# Patient Record
Sex: Female | Born: 1990 | Race: Black or African American | Hispanic: No | Marital: Married | Smoking: Never smoker
Health system: Southern US, Community
[De-identification: ages and names within clinical notes are randomized; demographics above are authoritative.]

## PROBLEM LIST (undated history)

## (undated) ENCOUNTER — Inpatient Hospital Stay (HOSPITAL_COMMUNITY): Payer: Self-pay

## (undated) DIAGNOSIS — Z3490 Encounter for supervision of normal pregnancy, unspecified, unspecified trimester: Principal | ICD-10-CM

## (undated) DIAGNOSIS — E669 Obesity, unspecified: Secondary | ICD-10-CM

## (undated) DIAGNOSIS — F32A Depression, unspecified: Secondary | ICD-10-CM

## (undated) DIAGNOSIS — R112 Nausea with vomiting, unspecified: Secondary | ICD-10-CM

## (undated) DIAGNOSIS — O219 Vomiting of pregnancy, unspecified: Secondary | ICD-10-CM

## (undated) DIAGNOSIS — E282 Polycystic ovarian syndrome: Secondary | ICD-10-CM

## (undated) DIAGNOSIS — J45909 Unspecified asthma, uncomplicated: Secondary | ICD-10-CM

## (undated) DIAGNOSIS — K760 Fatty (change of) liver, not elsewhere classified: Secondary | ICD-10-CM

## (undated) DIAGNOSIS — R05 Cough: Secondary | ICD-10-CM

## (undated) DIAGNOSIS — O139 Gestational [pregnancy-induced] hypertension without significant proteinuria, unspecified trimester: Secondary | ICD-10-CM

## (undated) DIAGNOSIS — J069 Acute upper respiratory infection, unspecified: Principal | ICD-10-CM

## (undated) DIAGNOSIS — K219 Gastro-esophageal reflux disease without esophagitis: Secondary | ICD-10-CM

## (undated) DIAGNOSIS — G43909 Migraine, unspecified, not intractable, without status migrainosus: Secondary | ICD-10-CM

## (undated) HISTORY — DX: Polycystic ovarian syndrome: E28.2

## (undated) HISTORY — DX: Fatty (change of) liver, not elsewhere classified: K76.0

## (undated) HISTORY — DX: Cough: R05

## (undated) HISTORY — DX: Vomiting of pregnancy, unspecified: O21.9

## (undated) HISTORY — DX: Acute upper respiratory infection, unspecified: J06.9

## (undated) HISTORY — PX: OTHER SURGICAL HISTORY: SHX169

## (undated) HISTORY — DX: Encounter for supervision of normal pregnancy, unspecified, unspecified trimester: Z34.90

## (undated) HISTORY — PX: TONSILLECTOMY: SUR1361

## (undated) HISTORY — PX: WISDOM TOOTH EXTRACTION: SHX21

---
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
## 2009-03-31 ENCOUNTER — Encounter (INDEPENDENT_AMBULATORY_CARE_PROVIDER_SITE_OTHER): Payer: Self-pay | Admitting: Pediatrics

## 2009-03-31 ENCOUNTER — Other Ambulatory Visit: Admission: RE | Admit: 2009-03-31 | Discharge: 2009-03-31 | Payer: Self-pay | Admitting: Pediatrics

## 2009-10-27 ENCOUNTER — Encounter (HOSPITAL_COMMUNITY): Admission: RE | Admit: 2009-10-27 | Discharge: 2009-11-18 | Payer: Self-pay

## 2011-08-04 ENCOUNTER — Inpatient Hospital Stay (INDEPENDENT_AMBULATORY_CARE_PROVIDER_SITE_OTHER)
Admission: RE | Admit: 2011-08-04 | Discharge: 2011-08-04 | Disposition: A | Discharge status: Home / Self Care | Payer: BC Managed Care – PPO | Source: Ambulatory Visit | Attending: Family Medicine | Admitting: Family Medicine

## 2011-08-04 DIAGNOSIS — G44209 Tension-type headache, unspecified, not intractable: Secondary | ICD-10-CM

## 2011-08-04 DIAGNOSIS — M79609 Pain in unspecified limb: Secondary | ICD-10-CM

## 2011-08-19 ENCOUNTER — Other Ambulatory Visit: Payer: Self-pay | Admitting: Adult Health

## 2011-08-19 ENCOUNTER — Other Ambulatory Visit (HOSPITAL_COMMUNITY)
Admission: RE | Admit: 2011-08-19 | Discharge: 2011-08-19 | Disposition: A | Discharge status: Home / Self Care | Payer: BC Managed Care – PPO | Source: Ambulatory Visit | Attending: Obstetrics and Gynecology | Admitting: Obstetrics and Gynecology

## 2011-08-19 DIAGNOSIS — Z01419 Encounter for gynecological examination (general) (routine) without abnormal findings: Secondary | ICD-10-CM | POA: Insufficient documentation

## 2011-08-19 DIAGNOSIS — Z113 Encounter for screening for infections with a predominantly sexual mode of transmission: Secondary | ICD-10-CM | POA: Insufficient documentation

## 2011-12-22 ENCOUNTER — Encounter (HOSPITAL_COMMUNITY): Payer: Self-pay | Admitting: *Deleted

## 2011-12-22 ENCOUNTER — Emergency Department (HOSPITAL_COMMUNITY)
Admission: EM | Admit: 2011-12-22 | Discharge: 2011-12-22 | Disposition: A | Discharge status: Home / Self Care | Payer: BC Managed Care – PPO | Attending: Emergency Medicine | Admitting: Emergency Medicine

## 2011-12-22 DIAGNOSIS — R112 Nausea with vomiting, unspecified: Secondary | ICD-10-CM

## 2011-12-22 DIAGNOSIS — R197 Diarrhea, unspecified: Secondary | ICD-10-CM | POA: Insufficient documentation

## 2011-12-22 DIAGNOSIS — R109 Unspecified abdominal pain: Secondary | ICD-10-CM | POA: Insufficient documentation

## 2011-12-22 DIAGNOSIS — D72829 Elevated white blood cell count, unspecified: Secondary | ICD-10-CM | POA: Insufficient documentation

## 2011-12-22 LAB — URINALYSIS, ROUTINE W REFLEX MICROSCOPIC
Leukocytes, UA: NEGATIVE
Nitrite: NEGATIVE
Protein, ur: 30 mg/dL — AB
Specific Gravity, Urine: 1.03 (ref 1.005–1.030)
Urobilinogen, UA: 0.2 mg/dL (ref 0.0–1.0)

## 2011-12-22 LAB — CBC
MCHC: 33.3 g/dL (ref 30.0–36.0)
Platelets: 271 10*3/uL (ref 150–400)
RDW: 13.9 % (ref 11.5–15.5)
WBC: 11.6 10*3/uL — ABNORMAL HIGH (ref 4.0–10.5)

## 2011-12-22 LAB — COMPREHENSIVE METABOLIC PANEL
ALT: 24 U/L (ref 0–35)
AST: 25 U/L (ref 0–37)
Albumin: 3.9 g/dL (ref 3.5–5.2)
CO2: 24 mEq/L (ref 19–32)
Chloride: 103 mEq/L (ref 96–112)
Creatinine, Ser: 0.76 mg/dL (ref 0.50–1.10)
Potassium: 4.1 mEq/L (ref 3.5–5.1)
Sodium: 138 mEq/L (ref 135–145)
Total Bilirubin: 0.4 mg/dL (ref 0.3–1.2)

## 2011-12-22 LAB — DIFFERENTIAL
Basophils Absolute: 0 10*3/uL (ref 0.0–0.1)
Basophils Relative: 0 % (ref 0–1)
Lymphocytes Relative: 3 % — ABNORMAL LOW (ref 12–46)
Monocytes Absolute: 0.8 10*3/uL (ref 0.1–1.0)
Neutro Abs: 10.3 10*3/uL — ABNORMAL HIGH (ref 1.7–7.7)
Neutrophils Relative %: 89 % — ABNORMAL HIGH (ref 43–77)

## 2011-12-22 LAB — LIPASE, BLOOD: Lipase: 19 U/L (ref 11–59)

## 2011-12-22 LAB — URINE MICROSCOPIC-ADD ON

## 2011-12-22 MED ORDER — DICYCLOMINE HCL 10 MG/ML IM SOLN
20.0000 mg | Freq: Once | INTRAMUSCULAR | Status: AC
  Administered 2011-12-22: 20 mg via INTRAMUSCULAR
  Filled 2011-12-22: qty 2

## 2011-12-22 MED ORDER — ONDANSETRON 8 MG PO TBDP
8.0000 mg | ORAL_TABLET | Freq: Once | ORAL | Status: AC
  Administered 2011-12-22: 8 mg via ORAL
  Filled 2011-12-22: qty 1

## 2011-12-22 MED ORDER — ONDANSETRON 4 MG PO TBDP: 4.0000 mg | ORAL_TABLET | Freq: Four times a day (QID) | ORAL | Status: AC | PRN

## 2011-12-22 NOTE — ED Notes (Signed)
Pt states started vomiting last night. Denies fever. Vomited last ago.

## 2011-12-22 NOTE — ED Notes (Signed)
Pt reports n/v/d that started yesterday around 1900. Continued during the night. Last vomited about 20 min ago.

## 2011-12-22 NOTE — ED Provider Notes (Signed)
History     CSN: [id number]  Arrival date & time 12/22/11  6295   Chief Complaint  Patient presents with  . Nausea  . Emesis    HPI Pt was seen at 0520.  Per pt, c/o gradual onset and persistence of multiple intermittent episodes of N/V/D since yesterday.  Has been assoc with intermittent generalized abd "cramping."  Denies fevers, no CP/SOB, no back pain, no dysuria, no vaginal bleeding/discharge, no black or blood in stools or emesis.    History reviewed. No pertinent past medical history.  Past Surgical History  Procedure Date  . Tonsillectomy     History  Substance Use Topics  . Smoking status: Never Smoker   . Smokeless tobacco: Not on file  . Alcohol Use: Yes    Review of Systems ROS: Statement: All systems negative except as marked or noted in the HPI; Constitutional: Negative for fever and chills. ; ; Eyes: Negative for eye pain, redness and discharge. ; ; ENMT: Negative for ear pain, hoarseness, nasal congestion, sinus pressure and sore throat. ; ; Cardiovascular: Negative for chest pain, palpitations, diaphoresis, dyspnea and peripheral edema. ; ; Respiratory: Negative for cough, wheezing and stridor. ; ; Gastrointestinal: +N/V/D, abd pain. Negative for blood in stool, hematemesis, jaundice and rectal bleeding. . ; ; Genitourinary: Negative for dysuria, flank pain and hematuria. ; ; Musculoskeletal: Negative for back pain and neck pain. Negative for swelling and trauma.; ; Skin: Negative for pruritus, rash, abrasions, blisters, bruising and skin lesion.; ; Neuro: Negative for headache, lightheadedness and neck stiffness. Negative for weakness, altered level of consciousness , altered mental status, extremity weakness, paresthesias, involuntary movement, seizure and syncope.      Allergies  Keflex  Home Medications   Current Outpatient Rx  Name Route Sig Dispense Refill  . METHOCARBAMOL 500 MG PO TABS Oral Take 500 mg by mouth as needed.    Marland Kitchen ONDANSETRON 4 MG PO  TBDP Oral Take 1 tablet (4 mg total) by mouth every 6 (six) hours as needed for nausea. 8 tablet 0    BP 115/60  Pulse 82  Temp(Src) 99.1 F (37.3 C) (Oral)  Resp 20  SpO2 100%  Physical Exam 0525: Physical examination:  Nursing notes reviewed; Vital signs and O2 SAT reviewed;  Constitutional: Well developed, Well nourished, Well hydrated, In no acute distress; Head:  Normocephalic, atraumatic; Eyes: EOMI, PERRL, No scleral icterus; ENMT: Mouth and pharynx normal, Mucous membranes moist; Neck: Supple, Full range of motion, No lymphadenopathy; Cardiovascular: +tachycardic rate and rhythm, No murmur or gallop; Respiratory: Breath sounds clear & equal bilaterally, No rales, rhonchi, wheezes, or rub, Normal respiratory effort/excursion; Chest: Nontender, Movement normal; Abdomen: Soft, Nontender, Nondistended, Normal bowel sounds; Genitourinary: No CVA tenderness; Extremities: Pulses normal, No tenderness, No edema, No calf edema or asymmetry.; Neuro: AA&Ox3, Major CN grossly intact.  No gross focal motor or sensory deficits in extremities.; Skin: Color normal, Warm, Dry, no rash.    ED Course  Procedures  MDM  MDM Reviewed: nursing note, vitals and previous chart Interpretation: labs   Results for orders placed during the hospital encounter of 12/22/11  URINALYSIS, ROUTINE W REFLEX MICROSCOPIC      Component Value Range   Color, Urine YELLOW  YELLOW    APPearance CLEAR  CLEAR    Specific Gravity, Urine 1.030  1.005 - 1.030    pH 5.5  5.0 - 8.0    Glucose, UA NEGATIVE  NEGATIVE (mg/dL)   Hgb urine dipstick NEGATIVE  NEGATIVE  Bilirubin Urine NEGATIVE  NEGATIVE    Ketones, ur 15 (*) NEGATIVE (mg/dL)   Protein, ur 30 (*) NEGATIVE (mg/dL)   Urobilinogen, UA 0.2  0.0 - 1.0 (mg/dL)   Nitrite NEGATIVE  NEGATIVE    Leukocytes, UA NEGATIVE  NEGATIVE   LIPASE, BLOOD      Component Value Range   Lipase 19  11 - 59 (U/L)  CBC      Component Value Range   WBC 11.6 (*) 4.0 - 10.5 (K/uL)     RBC 5.30 (*) 3.87 - 5.11 (MIL/uL)   Hemoglobin 13.9  12.0 - 15.0 (g/dL)   HCT 16.1  09.6 - 04.5 (%)   MCV 78.9  78.0 - 100.0 (fL)   MCH 26.2  26.0 - 34.0 (pg)   MCHC 33.3  30.0 - 36.0 (g/dL)   RDW 40.9  81.1 - 91.4 (%)   Platelets 271  150 - 400 (K/uL)  DIFFERENTIAL      Component Value Range   Neutrophils Relative 89 (*) 43 - 77 (%)   Neutro Abs 10.3 (*) 1.7 - 7.7 (K/uL)   Lymphocytes Relative 3 (*) 12 - 46 (%)   Lymphs Abs 0.4 (*) 0.7 - 4.0 (K/uL)   Monocytes Relative 7  3 - 12 (%)   Monocytes Absolute 0.8  0.1 - 1.0 (K/uL)   Eosinophils Relative 0  0 - 5 (%)   Eosinophils Absolute 0.0  0.0 - 0.7 (K/uL)   Basophils Relative 0  0 - 1 (%)   Basophils Absolute 0.0  0.0 - 0.1 (K/uL)  COMPREHENSIVE METABOLIC PANEL      Component Value Range   Sodium 138  135 - 145 (mEq/L)   Potassium 4.1  3.5 - 5.1 (mEq/L)   Chloride 103  96 - 112 (mEq/L)   CO2 24  19 - 32 (mEq/L)   Glucose, Bld 122 (*) 70 - 99 (mg/dL)   BUN 13  6 - 23 (mg/dL)   Creatinine, Ser 7.82  0.50 - 1.10 (mg/dL)   Calcium 95.6  8.4 - 10.5 (mg/dL)   Total Protein 7.9  6.0 - 8.3 (g/dL)   Albumin 3.9  3.5 - 5.2 (g/dL)   AST 25  0 - 37 (U/L)   ALT 24  0 - 35 (U/L)   Alkaline Phosphatase 82  39 - 117 (U/L)   Total Bilirubin 0.4  0.3 - 1.2 (mg/dL)   GFR calc non Af Amer >90  >90 (mL/min)   GFR calc Af Amer >90  >90 (mL/min)  POCT PREGNANCY, URINE      Component Value Range   Preg Test, Ur NEGATIVE  NEGATIVE   URINE MICROSCOPIC-ADD ON      Component Value Range   WBC, UA 3-6  <3 (WBC/hpf)   Bacteria, UA MANY (*) RARE       6:54 AM:   Pt without urinary symptoms.  Will add UC to labs.  WBC mildly elevated, but this is non-specific.  Pt has tol PO well while in ED without N/V.  Has been ambulating around ED exam room with steady gait, easy resps.  Wants to go home now.  Dx testing d/w pt and family.  Questions answered.  Verb understanding, agreeable to d/c home with outpt f/u.           Susie Ehresman  Allison Quarry, DO 12/24/11 [phone]

## 2011-12-22 NOTE — ED Notes (Signed)
Pt states nausea is better, requesting something to drink, pt informed that we are going to wait a little longer before trying something to drink yet. Bed in low position, side rails up x2. NAD noted.

## 2011-12-23 LAB — URINE CULTURE
Colony Count: 6000
Culture  Setup Time: 201301301732

## 2012-04-27 ENCOUNTER — Encounter (HOSPITAL_COMMUNITY): Payer: Self-pay | Admitting: *Deleted

## 2012-04-27 ENCOUNTER — Emergency Department (HOSPITAL_COMMUNITY)
Admission: EM | Admit: 2012-04-27 | Discharge: 2012-04-27 | Disposition: A | Discharge status: Home / Self Care | Payer: BC Managed Care – PPO | Attending: Emergency Medicine | Admitting: Emergency Medicine

## 2012-04-27 DIAGNOSIS — R1031 Right lower quadrant pain: Secondary | ICD-10-CM | POA: Insufficient documentation

## 2012-04-27 DIAGNOSIS — X58XXXA Exposure to other specified factors, initial encounter: Secondary | ICD-10-CM | POA: Insufficient documentation

## 2012-04-27 DIAGNOSIS — R109 Unspecified abdominal pain: Secondary | ICD-10-CM

## 2012-04-27 DIAGNOSIS — Z23 Encounter for immunization: Secondary | ICD-10-CM | POA: Insufficient documentation

## 2012-04-27 DIAGNOSIS — S41109A Unspecified open wound of unspecified upper arm, initial encounter: Secondary | ICD-10-CM | POA: Insufficient documentation

## 2012-04-27 DIAGNOSIS — IMO0002 Reserved for concepts with insufficient information to code with codable children: Secondary | ICD-10-CM

## 2012-04-27 LAB — URINALYSIS, ROUTINE W REFLEX MICROSCOPIC
Bilirubin Urine: NEGATIVE
Glucose, UA: NEGATIVE mg/dL
Ketones, ur: NEGATIVE mg/dL
Protein, ur: NEGATIVE mg/dL
pH: 6 (ref 5.0–8.0)

## 2012-04-27 MED ORDER — TETANUS-DIPHTH-ACELL PERTUSSIS 5-2.5-18.5 LF-MCG/0.5 IM SUSP
0.5000 mL | Freq: Once | INTRAMUSCULAR | Status: AC
  Administered 2012-04-27: 0.5 mL via INTRAMUSCULAR
  Filled 2012-04-27: qty 0.5

## 2012-04-27 NOTE — ED Notes (Signed)
Patient states she feels like her implanted birth control is causing her abdominal pain, states she asked to have it removed and could not get an appointment for another week, states he abdomen hurts all the time and the cramping is very uncomfortable

## 2012-04-27 NOTE — Discharge Instructions (Signed)

## 2012-04-27 NOTE — ED Notes (Signed)
See triage note, small lac noted to left upper arm

## 2012-04-27 NOTE — ED Notes (Signed)
States she tried to remove her implanted birth control  Causing a laceration

## 2012-04-27 NOTE — ED Provider Notes (Signed)
History  This chart was scribed for Joya Gaskins, MD by Bennett Scrape. This patient was seen in room APA19/APA19 and the patient's care was started at 9:24PM.  CSN: [id number]  Arrival date & time 04/27/12  2016   First MD Initiated Contact with Patient 04/27/12 2124      Chief Complaint  Patient presents with  . Abdominal Pain    Patient is a 21 y.o. female presenting with skin laceration. The history is provided by the patient. No language interpreter was used.  Laceration  The incident occurred 3 to 5 hours ago. The laceration is located on the left arm. The laceration mechanism was a a razor. The patient is experiencing no pain. Her tetanus status is out of date.    Patricia [name] is a 21 y.o. female who presents to the Emergency Department complaining of a self-inflicted laceration to the left upper arm while she was attempting to remove her birth control implant from her arm with a clean blade. The bleeding is controled and she has the area covered with a band-aid. She states that she cleaned the wound with iodine afterwards. Pt states that she had the device implanted in her arm in November 2012. She states that she has been having constant RLQ abdominal cramping since [facility] March 2013. She reports that the cramping will wake her up out of sleep several times a night. She denies SI. She reports that she has tried ibuprofen, motrin and tramadol that she was prescribed for HA and neck cramping with no improvement in the abdominal cramping. She reports that she called PCP and was told that her PCP wants to wait 2 more months to remove the device. Pt states that she lost her temper and tried to remove it herself to relieve the abdominal cramping. She also c/o upper back and frequency. She states that she doesn't remember her last pregnancy test or when her last TD vaccine was. She denies vaginal bleed, fevers, vaginal discharge, and dysuria as associated symptoms. She has no h/o chronic  medical conditions. She is an occasional alcohol user but denies smoking.  PCP is Dr. Seymour Bars.   History reviewed. No pertinent past medical history.  Past Surgical History  Procedure Date  . Tonsillectomy     No family history on file.  History  Substance Use Topics  . Smoking status: Never Smoker   . Smokeless tobacco: Not on file  . Alcohol Use: Yes     Review of Systems  A complete 10 system review of systems was obtained and all systems are negative except as noted in the HPI and PMH.   Allergies  Cephalexin  Home Medications   Current Outpatient Rx  Name Route Sig Dispense Refill  . METHOCARBAMOL 500 MG PO TABS Oral Take 500 mg by mouth as needed.      Triage Vitals: BP 144/63  Pulse 98  Temp(Src) 97.9 F (36.6 C) (Oral)  Resp 20  Ht 5\' 5"  (1.651 m)  Wt 260 lb (117.935 kg)  BMI 43.27 kg/m2  SpO2 100%  Physical Exam  Nursing note and vitals reviewed.   CONSTITUTIONAL: Well developed/well nourished HEAD AND FACE: Normocephalic/atraumatic EYES: EOMI/PERRL ENMT: Mucous membranes moist NECK: supple no meningeal signs SPINE:entire spine nontender CV: S1/S2 noted, no murmurs/rubs/gallops noted LUNGS: Lungs are clear to auscultation bilaterally, no apparent distress ABDOMEN: soft, nontender, no rebound or guarding GU:no cva tenderness NEURO: Pt is awake/alert, moves all extremitiesx4 EXTREMITIES: pulses normal, full ROM SKIN: warm, color  normal, small non-bleeding laceration to the inner aspect of the left upper arm, no erythema/fluctuance PSYCH: no abnormalities of mood noted  ED Course  Procedures DIAGNOSTIC STUDIES: Oxygen Saturation is 100% on room air, normal by my interpretation.    COORDINATION OF CARE: 9:42PM-Discussed treatment plan of urinalysis and TD vaccine with pt and pt agreed to plan. Advised pt against attempting to remove her birth control device by herself again. Pt turned down pain medications.  10:18PM-Discussed discharge plan  with pt and pt agreed to plan. Pt regrets trying to cut out her implant device.  She denies SI.  Abdominal exam unremarkable, this is not a new issue, denies vag bleeding, defer pelvic, already has gyn followup next week   MDM  Nursing notes including past medical history, social history and family history reviewed and considered in documentation All labs/vitals reviewed and considered    I personally performed the services described in this documentation, which was scribed in my presence. The recorded information has been reviewed and considered.         Joya Gaskins, MD 04/27/12 [phone]

## 2012-04-27 NOTE — ED Notes (Signed)
States she has abdominal pain

## 2012-11-21 ENCOUNTER — Emergency Department (HOSPITAL_COMMUNITY)
Admission: EM | Admit: 2012-11-21 | Discharge: 2012-11-21 | Disposition: A | Discharge status: Home / Self Care | Payer: BC Managed Care – PPO | Attending: Emergency Medicine | Admitting: Emergency Medicine

## 2012-11-21 ENCOUNTER — Encounter (HOSPITAL_COMMUNITY): Payer: Self-pay | Admitting: *Deleted

## 2012-11-21 DIAGNOSIS — Z8742 Personal history of other diseases of the female genital tract: Secondary | ICD-10-CM | POA: Insufficient documentation

## 2012-11-21 DIAGNOSIS — G43909 Migraine, unspecified, not intractable, without status migrainosus: Secondary | ICD-10-CM | POA: Insufficient documentation

## 2012-11-21 DIAGNOSIS — I10 Essential (primary) hypertension: Secondary | ICD-10-CM | POA: Insufficient documentation

## 2012-11-21 HISTORY — DX: Polycystic ovarian syndrome: E28.2

## 2012-11-21 HISTORY — DX: Migraine, unspecified, not intractable, without status migrainosus: G43.909

## 2012-11-21 MED ORDER — PROMETHAZINE HCL 12.5 MG PO TABS
12.5000 mg | ORAL_TABLET | Freq: Once | ORAL | Status: AC
  Administered 2012-11-21: 12.5 mg via ORAL
  Filled 2012-11-21: qty 1

## 2012-11-21 MED ORDER — OXYCODONE-ACETAMINOPHEN 5-325 MG PO TABS
1.0000 | ORAL_TABLET | Freq: Once | ORAL | Status: AC
  Administered 2012-11-21: 1 via ORAL
  Filled 2012-11-21: qty 1

## 2012-11-21 MED ORDER — HYDROCODONE-ACETAMINOPHEN 7.5-325 MG PO TABS: 1.0000 | ORAL_TABLET | ORAL | Status: AC | PRN

## 2012-11-21 MED ORDER — PROMETHAZINE HCL 25 MG PO TABS: 12.5000 mg | ORAL_TABLET | Freq: Four times a day (QID) | ORAL | Status: DC | PRN

## 2012-11-21 MED ORDER — KETOROLAC TROMETHAMINE 10 MG PO TABS
10.0000 mg | ORAL_TABLET | Freq: Once | ORAL | Status: AC
  Administered 2012-11-21: 10 mg via ORAL
  Filled 2012-11-21: qty 1

## 2012-11-21 NOTE — ED Notes (Signed)
Pt co headache, states her "blood pressure was 140/90 at [employer]", BP is 138/88 here.

## 2012-11-21 NOTE — ED Provider Notes (Signed)
History     CSN: [id number]  Arrival date & time 11/21/12  1637   First MD Initiated Contact with Patient 11/21/12 1655      Chief Complaint  Patient presents with  . Headache  . Hypertension    140/90 at [employer] per pt    (Consider location/radiation/quality/duration/timing/severity/associated sxs/prior treatment) Patient is a 21 y.o. female presenting with headaches and hypertension. The history is provided by the patient.  Headache  This is a new problem. The current episode started 3 to 5 hours ago. The problem occurs constantly. The problem has been gradually worsening. The headache is associated with nothing. The pain is located in the frontal region. The quality of the pain is described as throbbing. The pain is moderate. Pertinent negatives include no anorexia, no fever, no palpitations, no syncope, no shortness of breath, no nausea and no vomiting. She has tried nothing for the symptoms.  Hypertension Associated symptoms include headaches. Pertinent negatives include no abdominal pain, anorexia, arthralgias, chest pain, coughing, fever, nausea, neck pain or vomiting.    Past Medical History  Diagnosis Date  . Polycystic disease, ovaries   . Migraine     Past Surgical History  Procedure Date  . Tonsillectomy     History reviewed. No pertinent family history.  History  Substance Use Topics  . Smoking status: Never Smoker   . Smokeless tobacco: Not on file  . Alcohol Use: Yes    OB History    Grav Para Term Preterm Abortions TAB SAB Ect Mult Living                  Review of Systems  Constitutional: Negative for fever and activity change.       All ROS Neg except as noted in HPI  HENT: Negative for nosebleeds and neck pain.   Eyes: Negative for photophobia and discharge.  Respiratory: Negative for cough, shortness of breath and wheezing.   Cardiovascular: Negative for chest pain, palpitations and syncope.  Gastrointestinal: Negative for nausea,  vomiting, abdominal pain, blood in stool and anorexia.  Genitourinary: Negative for dysuria, frequency and hematuria.  Musculoskeletal: Negative for back pain and arthralgias.  Skin: Negative.   Neurological: Positive for headaches. Negative for dizziness, seizures and speech difficulty.  Psychiatric/Behavioral: Negative for hallucinations and confusion.    Allergies  Peanut-containing drug products and Cephalexin  Home Medications   Current Outpatient Rx  Name  Route  Sig  Dispense  Refill  . ETONOGESTREL 68 MG [place] IMPL   Subcutaneous   Inject 1 each into the skin once.           BP 134/88  Pulse 80  Temp 97.4 F (36.3 C) (Oral)  Resp 18  Ht 5\' 5"  (1.651 m)  Wt 260 lb (117.935 kg)  BMI 43.27 kg/m2  SpO2 100%  LMP 11/04/2012  Physical Exam  Nursing note and vitals reviewed. Constitutional: She is oriented to person, place, and time. She appears well-developed and well-nourished.  Non-toxic appearance.  HENT:  Head: Normocephalic.  Right Ear: Tympanic membrane and external ear normal.  Left Ear: Tympanic membrane and external ear normal.  Eyes: EOM and lids are normal. Pupils are equal, round, and reactive to light.  Neck: Normal range of motion. Neck supple. Carotid bruit is not present.  Cardiovascular: Normal rate, regular rhythm, normal heart sounds, intact distal pulses and normal pulses.   Pulmonary/Chest: Breath sounds normal. No respiratory distress.  Abdominal: Soft. Bowel sounds are normal. There is no tenderness.  There is no guarding.  Musculoskeletal: Normal range of motion.  Lymphadenopathy:       Head (right side): No submandibular adenopathy present.       Head (left side): No submandibular adenopathy present.    She has no cervical adenopathy.  Neurological: She is alert and oriented to person, place, and time. She has normal strength. No cranial nerve deficit or sensory deficit.  Skin: Skin is warm and dry.  Psychiatric: She has a normal mood and  affect. Her speech is normal.    ED Course  Procedures (including critical care time)  Labs Reviewed - No data to display No results found.   No diagnosis found.    MDM  I have reviewed nursing notes, vital signs, and all appropriate lab and imaging results for this patient. Patient has history of migraine headaches at times. She usually takes Naprosyn and this resolves the headache. Today the headache was more persistent and more intense. Patient was also concerned that her blood pressure at [employer] was 140/90. No gross neurologic deficit appreciated on examination. Patient treated in the department with Percocet and Toradol. Headache improved to a 3-4/10. B/P rechecked at 6:24 an noted to be 132/84. No new neuro changes noted. Pt d/c with Rx for Norco and promethazine. Suggested pt see Headache wellness specialist for additional evaluation.       Kathie Dike, [place] 11/21/12 [phone]

## 2012-11-24 NOTE — ED Provider Notes (Signed)
Medical screening examination/treatment/procedure(s) were performed by non-physician practitioner and as supervising physician I was immediately available for consultation/collaboration.  Inas Avena, MD 11/24/12 0142 

## 2013-04-17 ENCOUNTER — Other Ambulatory Visit: Payer: Self-pay | Admitting: Adult Health

## 2013-10-12 ENCOUNTER — Encounter (HOSPITAL_COMMUNITY): Payer: Self-pay | Admitting: Emergency Medicine

## 2013-10-12 ENCOUNTER — Emergency Department (HOSPITAL_COMMUNITY)
Admission: EM | Admit: 2013-10-12 | Discharge: 2013-10-12 | Discharge status: Against Medical Advice | Payer: BC Managed Care – PPO | Attending: Emergency Medicine | Admitting: Emergency Medicine

## 2013-10-12 DIAGNOSIS — Z8742 Personal history of other diseases of the female genital tract: Secondary | ICD-10-CM | POA: Insufficient documentation

## 2013-10-12 DIAGNOSIS — Z79899 Other long term (current) drug therapy: Secondary | ICD-10-CM | POA: Insufficient documentation

## 2013-10-12 DIAGNOSIS — M6281 Muscle weakness (generalized): Secondary | ICD-10-CM | POA: Insufficient documentation

## 2013-10-12 DIAGNOSIS — R1110 Vomiting, unspecified: Secondary | ICD-10-CM | POA: Insufficient documentation

## 2013-10-12 DIAGNOSIS — Z8669 Personal history of other diseases of the nervous system and sense organs: Secondary | ICD-10-CM | POA: Insufficient documentation

## 2013-10-12 HISTORY — DX: Migraine, unspecified, not intractable, without status migrainosus: G43.909

## 2013-10-12 LAB — COMPREHENSIVE METABOLIC PANEL
Alkaline Phosphatase: 70 U/L (ref 39–117)
BUN: 8 mg/dL (ref 6–23)
Calcium: 9.3 mg/dL (ref 8.4–10.5)
Creatinine, Ser: 0.77 mg/dL (ref 0.50–1.10)
GFR calc Af Amer: >90 mL/min (ref >90)
Glucose, Bld: 86 mg/dL (ref 70–99)
Total Protein: 7.7 g/dL (ref 6.0–8.3)

## 2013-10-12 LAB — CBC WITH DIFFERENTIAL/PLATELET
Eosinophils Absolute: 0.2 10*3/uL (ref 0.0–0.7)
Eosinophils Relative: 2 % (ref 0–5)
Hemoglobin: 11.9 g/dL — ABNORMAL LOW (ref 12.0–15.0)
Lymphs Abs: 3.9 10*3/uL (ref 0.7–4.0)
MCH: 26.3 pg (ref 26.0–34.0)
MCV: 79.2 fL (ref 78.0–100.0)
Monocytes Absolute: 0.9 10*3/uL (ref 0.1–1.0)
Monocytes Relative: 9 % (ref 3–12)
RBC: 4.53 MIL/uL (ref 3.87–5.11)

## 2013-10-12 LAB — URINALYSIS, ROUTINE W REFLEX MICROSCOPIC
Bilirubin Urine: NEGATIVE
Nitrite: NEGATIVE
Specific Gravity, Urine: 1.006 (ref 1.005–1.030)
Urobilinogen, UA: 0.2 mg/dL (ref 0.0–1.0)

## 2013-10-12 LAB — LIPASE, BLOOD: Lipase: 28 U/L (ref 11–59)

## 2013-10-12 NOTE — ED Notes (Signed)
Pt to ED c/o increased urination x 2 weeks and emesis and weakness x 1 weak.  Pt also states she feels as if her L lower abdomen is swelling and "twitching".

## 2014-02-14 ENCOUNTER — Other Ambulatory Visit: Payer: Self-pay | Admitting: Obstetrics & Gynecology

## 2014-02-28 ENCOUNTER — Other Ambulatory Visit: Payer: Self-pay | Admitting: Adult Health

## 2014-03-08 ENCOUNTER — Encounter: Payer: Self-pay | Admitting: Adult Health

## 2014-03-08 ENCOUNTER — Ambulatory Visit (INDEPENDENT_AMBULATORY_CARE_PROVIDER_SITE_OTHER): Payer: 59 | Admitting: Adult Health

## 2014-03-08 ENCOUNTER — Other Ambulatory Visit (HOSPITAL_COMMUNITY)
Admission: RE | Admit: 2014-03-08 | Discharge: 2014-03-08 | Disposition: A | Discharge status: Home / Self Care | Payer: 59 | Source: Ambulatory Visit | Attending: Obstetrics and Gynecology | Admitting: Obstetrics and Gynecology

## 2014-03-08 VITALS — BP 132/78 | HR 78 | Ht 65.0 in | Wt 285.0 lb

## 2014-03-08 DIAGNOSIS — Z01419 Encounter for gynecological examination (general) (routine) without abnormal findings: Secondary | ICD-10-CM | POA: Insufficient documentation

## 2014-03-08 DIAGNOSIS — Z113 Encounter for screening for infections with a predominantly sexual mode of transmission: Secondary | ICD-10-CM | POA: Insufficient documentation

## 2014-03-08 DIAGNOSIS — N760 Acute vaginitis: Secondary | ICD-10-CM | POA: Insufficient documentation

## 2014-03-08 DIAGNOSIS — E282 Polycystic ovarian syndrome: Secondary | ICD-10-CM

## 2014-03-08 HISTORY — DX: Polycystic ovarian syndrome: E28.2

## 2014-03-08 LAB — POCT WET PREP (WET MOUNT)
TRICHOMONAS WET PREP HPF POC: NEGATIVE
WBC WET PREP: NEGATIVE

## 2014-03-08 MED ORDER — CITALOPRAM HYDROBROMIDE 20 MG PO TABS: 20.0000 mg | ORAL_TABLET | Freq: Every day | ORAL | Status: DC

## 2014-03-08 NOTE — Patient Instructions (Signed)
Physical in 1 year Will with labs or go to my chart

## 2014-03-08 NOTE — Progress Notes (Signed)
Patient ID: Patricia [name], female   DOB: 1991-04-15, 23 y.o.   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  Current Medications, Allergies, Past Medical History, Past Surgical History, Family History and Social History were reviewed in [facility] electronic medical record.     Review of Systems: Patient denies any headaches, blurred vision, shortness of breath, chest pain, abdominal pain, problems with bowel movements, urination, or intercourse. No joint pain, mood at times, esp since IUD.    Physical Exam:BP 132/78  Pulse 78  Ht 5\' 5"  (1.651 m)  Wt 285 lb (129.275 kg)  BMI 47.43 kg/m2  LMP 01/28/2014 General:  Well developed, well nourished, no acute distress Skin:  Warm and dry Neck:  Midline trachea, normal thyroid Lungs; Clear to auscultation bilaterally Breast:  No dominant palpable mass, retraction, or nipple discharge. She is large wears G cup. Cardiovascular: Regular rate and rhythm Abdomen:  Soft, non tender, no hepatosplenomegaly Pelvic:  External genitalia is normal in appearance.  The vagina is normal in appearance.  The cervix is everted at os with +IUD strings, but short,Pap with GC/CHL and trich performed,wet prep was negative.  Uterus is felt to be normal size, shape, and contour.  No adnexal masses or tenderness noted. Extremities:  No swelling or varicosities noted Psych:  Alert and cooperative,seems happy, works at [employer], lives in apartment in [place] with her dog. [facility] with her, she was already aware.  Impression: Yearly gyn exam PCO STD screening Moodiness    Plan: Follow up by phone in 4 weeks on celexa Physical in 1 year Will follow up labs next week Check HIV,RPR,HSV 2 Rx celexa 20 mg 1 daily with 6 refills,#30

## 2014-03-09 LAB — HIV ANTIBODY (ROUTINE TESTING W REFLEX): HIV: NONREACTIVE

## 2014-03-09 LAB — RPR

## 2014-03-11 ENCOUNTER — Telehealth: Payer: Self-pay | Admitting: Adult Health

## 2014-03-11 LAB — HSV 2 ANTIBODY, IGG: HSV 2 GLYCOPROTEIN G AB, IGG: 0.11 IV

## 2014-03-11 NOTE — Telephone Encounter (Signed)
Pt aware tests negative 

## 2014-09-23 ENCOUNTER — Ambulatory Visit (INDEPENDENT_AMBULATORY_CARE_PROVIDER_SITE_OTHER): Payer: 59 | Admitting: Neurology

## 2014-09-23 ENCOUNTER — Encounter: Payer: Self-pay | Admitting: Neurology

## 2014-09-23 VITALS — BP 138/100 | HR 78 | Ht 64.0 in | Wt 272.0 lb

## 2014-09-23 DIAGNOSIS — G441 Vascular headache, not elsewhere classified: Secondary | ICD-10-CM

## 2014-09-23 DIAGNOSIS — R42 Dizziness and giddiness: Secondary | ICD-10-CM

## 2014-09-23 DIAGNOSIS — R51 Headache: Secondary | ICD-10-CM

## 2014-09-23 DIAGNOSIS — R519 Headache, unspecified: Secondary | ICD-10-CM | POA: Insufficient documentation

## 2014-09-23 NOTE — Patient Instructions (Signed)
Overall you are doing fairly well but I do want to suggest a few things today:   Remember to drink plenty of fluid, eat healthy meals and do not skip any meals. Try to eat protein with a every meal and eat a healthy snack such as fruit or nuts in between meals. Try to keep a regular sleep-wake schedule and try to exercise daily, particularly in the form of walking, 20-30 minutes a day, if you can.   As far as diagnostic testing: MRi of the brain, ENT referral. We will need recent labs from [place].   I would like to see you back in 3 months, sooner if we need to. Please call us with any interim questions, concerns, problems, updates or refill requests.   Please also call us for any test results so we can go over those with you on the phone.  My clinical assistant and will answer any of your questions and relay your messages to me and also relay most of my messages to you.   Our phone number is [phone]. We also have an after hours call service for urgent matters and there is a physician on-call for urgent questions. For any emergencies you know to call 911 or go to the nearest emergency room

## 2014-09-23 NOTE — Progress Notes (Addendum)
[facility]    Provider:  Dr Lucia GaskinsAhern Referring Provider: Cain SaupeFulp, Cammie, MD Primary Care Physician:  Vivia EwingHALM, STEVEN, MD  CC:  Dizziness and Headache  HPI:  Patricia [name] is a 23 y.o. female here as a referral from Dr. Jillyn HiddenFulp for Dizziness. Dizziness started in August. No inciting factors, no head trauma, no illnesses. She was seen at urgent care and has fluid behind her ears. Dizziness when moving around too fast. It happens if she is in a rush or getting up too fast. Also when moving her head quickly in any direction. Lasts for a few seconds, has to hold onto something. Has nausea, no ear fullness, no vomiting. Having episodes multiple times daily. Has to stand still and it resolves. Was prescribed meclizine and tried it once but made her sleepy. Prednisone helped but the symptoms returned after a few weeks.Has had headaches for years but not connected to the dizziness. Headaches 1x a week. She has pain in the neck with tightness several times a week but no headache associated with the neck pain. The migraines are frontal and feel like pressure, not throbbing, are bilateral, with ear ringing, has to close her eyes and can't tolerate the light only once a week and resolved with excedrin or relpax. Has gained a lot of weight recently. Not sleeping well. No excessive daytime sleepiness. No snoring. Excedrin migraine helps. Relpax helps the migraines as well. Ha the migraines once a week at the most. No vision changes. Denies CP, SOB, palpitations. No ear pain.    Reviewed notes, labs and imaging from outside physicians, which showed: presented on 10/26 to her pcp with dizziness, meclizine has not helped. She has chronic nausea. No ear pain. Feelings of being off balance. No focal numbness or weakness. Has allergic rhinitis.   Review of Systems: Patient complains of symptoms per HPI as well as the following symptoms fatigue, feeling cold, spinning sensation, joint pain, headache,  dizziness, not enough sleep, decreased energey. Pertinent negatives per HPI. All others negative.   History   Social History  . Marital Status: Single    Spouse Name: N/A    Number of Children: N/A  . Years of Education: N/A   Occupational History  . Not on file.   Social History Main Topics  . Smoking status: Never Smoker   . Smokeless tobacco: Never Used  . Alcohol Use: No     Comment: occassional  . Drug Use: No  . Sexual Activity: Yes    Birth Control/ Protection: IUD   Other Topics Concern  . Not on file   Social History Narrative    Family History  Problem Relation Age of Onset  . Migraines Sister   . Hypertension Brother   . Heart disease Maternal Grandmother   . Cancer Maternal Grandfather     prostate  . Stroke Paternal Grandmother   . Cancer Paternal Grandfather     prostate  . Stroke Paternal Grandfather     Past Medical History  Diagnosis Date  . Polycystic disease, ovaries   . Migraine   . Migraines   . PCO (polycystic ovaries) 03/08/2014    Past Surgical History  Procedure Laterality Date  . Tonsillectomy      Current Outpatient Prescriptions  Medication Sig Dispense Refill  . eletriptan (RELPAX) 40 MG tablet Take 40 mg by mouth as needed for migraine or headache. One tablet by mouth at onset of headache. May repeat in 2 hours if headache persists or recurs.    .Marland Kitchen  metFORMIN (GLUMETZA) 500 MG (MOD) 24 hr tablet Take 500 mg by mouth daily with breakfast.    . Multiple Vitamin (MULTIVITAMIN) tablet Take 1 tablet by mouth daily.    . phentermine 37.5 MG capsule Take 37.5 mg by mouth every morning.    . citalopram (CELEXA) 20 MG tablet Take 1 tablet (20 mg total) by mouth daily. 30 tablet 6  . levonorgestrel (MIRENA) 20 MCG/24HR IUD 1 each by Intrauterine route once.     No current facility-administered medications for this visit.    Allergies as of 09/23/2014 - Review Complete 09/23/2014  Allergen Reaction Noted  . Peanut-containing drug  products Anaphylaxis and Hives 04/27/2012  . Propranolol Shortness Of Breath 09/23/2014  . Cephalexin Hives 12/22/2011  . Imitrex [sumatriptan] Hives 10/12/2013    Vitals: BP 138/100 mmHg  Pulse 78  Ht 5\' 4"  (1.626 m)  Wt 272 lb (123.378 kg)  BMI 46.67 kg/m2 Last Weight:  Wt Readings from Last 1 Encounters:  09/23/14 272 lb (123.378 kg)   Last Height:   Ht Readings from Last 1 Encounters:  09/23/14 5\' 4"  (1.626 m)   Physical exam: Exam: Gen: NAD, conversant, well nourised, obese, well groomed                     CV: RRR, no MRG. No Carotid Bruits. No peripheral edema, warm, nontender Eyes: Conjunctivae clear without exudates or hemorrhage  Neuro: Detailed Neurologic Exam  Speech:    Speech is normal; fluent and spontaneous with normal comprehension.  Cognition:    The patient is oriented to person, place, and time;     recent and remote memory intact;     language fluent;     normal attention, concentration,     fund of knowledge Cranial Nerves:    The pupils are equal, round, and reactive to light. The fundi are normal and spontaneous venous pulsations are present. Visual fields are full to finger confrontation. Extraocular movements are intact. Trigeminal sensation is intact and the muscles of mastication are normal. The face is symmetric. The palate elevates in the midline. Voice is normal. Shoulder shrug is normal. The tongue has normal motion without fasciculations.   Coordination:    Normal finger to nose and heel to shin. Normal rapid alternating movements.   Gait:    Heel-toe and tandem gait are normal.   Motor Observation:    No asymmetry, no atrophy, and no involuntary movements noted. Tone:    Normal muscle tone.    Posture:    Posture is normal. normal erect    Strength:    Strength is V/V in the upper and lower limbs.      Sensation: intact     Reflex Exam:  DTR's:    Deep tendon reflexes in the upper and lower extremities are normal  bilaterally.   Toes:    The toes are downgoing bilaterally.   Clonus:    Clonus is absent.       Assessment/Plan:  23 year old w 3 months of dizziness, vertiginous symptoms. Dix-Hallpike negative. She reports that her headaches are at most once a week and resolves with excedrin or relpax. Neuro exam non focal.    - MRi of the brain w/wo contrast due to chronic migraines and also to evaluate for any central causes of dissiness/vertigo - ENT referral for vertiginous symptoms/dizziness with reported fluid in the ears  - can try stopping the phentermine, dizziness could be medication related - Denies OSA  symptoms - Can try topamax in the future if migraines worse, may also help with weight loss. Discussed weight loss.  Naomie Dean, MD  [facility] [address] [place], [place] [address]  Phone [phone] Fax [phone] Lesly Dukes

## 2014-10-03 ENCOUNTER — Telehealth: Payer: Self-pay | Admitting: *Deleted

## 2014-10-03 NOTE — Telephone Encounter (Signed)
Fax release to [facility] on 10/01/14 needing labs.

## 2014-10-09 ENCOUNTER — Telehealth: Payer: Self-pay | Admitting: *Deleted

## 2014-10-15 ENCOUNTER — Telehealth: Payer: Self-pay | Admitting: *Deleted

## 2014-10-15 NOTE — Telephone Encounter (Signed)
Left message on medical records voicemail to please fax patients labs to Dr. Lucia GaskinsAhern office.

## 2014-11-25 NOTE — Telephone Encounter (Signed)
Return phone call.

## 2014-12-24 ENCOUNTER — Ambulatory Visit: Payer: 59 | Admitting: Neurology

## 2015-03-11 ENCOUNTER — Ambulatory Visit (INDEPENDENT_AMBULATORY_CARE_PROVIDER_SITE_OTHER): Payer: 59 | Admitting: Adult Health

## 2015-03-11 ENCOUNTER — Encounter: Payer: Self-pay | Admitting: Adult Health

## 2015-03-11 VITALS — BP 138/92 | HR 68 | Ht 65.0 in | Wt 266.0 lb

## 2015-03-11 DIAGNOSIS — Z8742 Personal history of other diseases of the female genital tract: Secondary | ICD-10-CM

## 2015-03-11 DIAGNOSIS — Z01419 Encounter for gynecological examination (general) (routine) without abnormal findings: Secondary | ICD-10-CM | POA: Diagnosis not present

## 2015-03-11 DIAGNOSIS — Z113 Encounter for screening for infections with a predominantly sexual mode of transmission: Secondary | ICD-10-CM

## 2015-03-11 NOTE — Progress Notes (Signed)
Patient ID: Patricia [name], female   DOB: 03/16/1991, [age] y.o.   MRN: [id number] History of Present Illness: Patricia [name] is a 24 year old black female, single in for well woman gyn exam.She a normal pap 03/08/14.   Current Medications, Allergies, Past Medical History, Past Surgical History, Family History and Social History were reviewed in [facility] electronic medical record.     Review of Systems: Patient denies any headaches, hearing loss, fatigue, blurred vision, shortness of breath, chest pain, abdominal pain, problems with bowel movements, urination, or intercourse. No joint pain or mood swings.Periods are every 34 days now and first day heavy and some cramps but fairly short.    Physical Exam:BP 138/92 mmHg  Pulse 68  Ht 5\' 5"  (1.651 m)  Wt 266 lb (120.657 kg)  BMI 44.26 kg/m2  LMP 03/04/2015 General:  Well developed, well nourished, no acute distress Skin:  Warm and dry Neck:  Midline trachea, normal thyroid, good ROM, no lymphadenopathy Lungs; Clear to auscultation bilaterally Breast:  No dominant palpable mass, retraction, or nipple discharge Cardiovascular: Regular rate and rhythm Abdomen:  Soft, non tender, no hepatosplenomegaly,obese Pelvic:  External genitalia is normal in appearance, no lesions.  The vagina is normal in appearance. Urethra has no lesions or masses. The cervix is smooth.  Uterus is felt to be normal size, shape, and contour.  No adnexal masses or tenderness noted.Bladder is non tender, no masses felt. Extremities/musculoskeletal:  No swelling or varicosities noted, no clubbing or cyanosis Psych:  No mood changes, alert and cooperative,seems happy Declines birth control at this time.Is OK if gets pregnant.  Impression:  Well woman gyn exam no pap History of PCO  STD screening    Plan: Check CBC,CMP,TSH and lipids, HIV,RPR and HSV2 Return in 2 weeks for US Pap and physical in 1 year

## 2015-03-11 NOTE — Patient Instructions (Signed)
Pap and physical in 1 year Return in 2 weeks for [place]

## 2015-03-12 ENCOUNTER — Telehealth: Payer: Self-pay | Admitting: Adult Health

## 2015-03-12 LAB — CBC
HCT: 36.9 % (ref 34.0–46.6)
Hemoglobin: 12 g/dL (ref 11.1–15.9)
MCH: 25.9 pg — AB (ref 26.6–33.0)
MCHC: 32.5 g/dL (ref 31.5–35.7)
MCV: 80 fL (ref 79–97)
Platelets: 350 10*3/uL (ref 150–379)
RBC: 4.64 x10E6/uL (ref 3.77–5.28)
RDW: 14.1 % (ref 12.3–15.4)
WBC: 7.1 10*3/uL (ref 3.4–10.8)

## 2015-03-12 LAB — LIPID PANEL
Chol/HDL Ratio: 3.6 ratio units (ref 0.0–4.4)
Cholesterol, Total: 167 mg/dL (ref 100–199)
HDL: 46 mg/dL (ref >39)
LDL Calculated: 107 mg/dL — ABNORMAL HIGH (ref 0–99)
Triglycerides: 71 mg/dL (ref 0–149)
VLDL CHOLESTEROL CAL: 14 mg/dL (ref 5–40)

## 2015-03-12 LAB — GC/CHLAMYDIA PROBE AMP
Chlamydia trachomatis, NAA: NEGATIVE
Neisseria gonorrhoeae by PCR: NEGATIVE

## 2015-03-12 LAB — COMPREHENSIVE METABOLIC PANEL
ALBUMIN: 4.1 g/dL (ref 3.5–5.5)
ALT: 16 IU/L (ref 0–32)
AST: 17 IU/L (ref 0–40)
Albumin/Globulin Ratio: 1.6 (ref 1.1–2.5)
Alkaline Phosphatase: 65 IU/L (ref 39–117)
BUN/Creatinine Ratio: 12 (ref 8–20)
BUN: 10 mg/dL (ref 6–20)
Bilirubin Total: 0.2 mg/dL (ref 0.0–1.2)
CO2: 24 mmol/L (ref 18–29)
Calcium: 9.4 mg/dL (ref 8.7–10.2)
Chloride: 101 mmol/L (ref 97–108)
Creatinine, Ser: 0.81 mg/dL (ref 0.57–1.00)
GFR calc non Af Amer: 102 mL/min/{1.73_m2} (ref >59)
GFR, EST AFRICAN AMERICAN: 118 mL/min/{1.73_m2} (ref >59)
Globulin, Total: 2.5 g/dL (ref 1.5–4.5)
Glucose: 92 mg/dL (ref 65–99)
POTASSIUM: 4.6 mmol/L (ref 3.5–5.2)
Sodium: 139 mmol/L (ref 134–144)
TOTAL PROTEIN: 6.6 g/dL (ref 6.0–8.5)

## 2015-03-12 LAB — TSH: TSH: 0.83 u[IU]/mL (ref 0.450–4.500)

## 2015-03-12 LAB — RPR: RPR Ser Ql: NONREACTIVE

## 2015-03-12 LAB — HIV ANTIBODY (ROUTINE TESTING W REFLEX): HIV Screen 4th Generation wRfx: NONREACTIVE

## 2015-03-12 LAB — HSV 2 ANTIBODY, IGG: HSV 2 Glycoprotein G Ab, IgG: <0.91 index (ref 0.00–0.90)

## 2015-03-12 NOTE — Telephone Encounter (Signed)
Pt aware labs normal  

## 2015-03-25 ENCOUNTER — Ambulatory Visit (INDEPENDENT_AMBULATORY_CARE_PROVIDER_SITE_OTHER): Payer: 59

## 2015-03-25 DIAGNOSIS — Z8742 Personal history of other diseases of the female genital tract: Secondary | ICD-10-CM

## 2015-03-25 NOTE — Progress Notes (Signed)
[place] pelvis: normal anteverted uterus,hx of pco, small follicles along periphery of ov's, no free fluid

## 2015-03-27 ENCOUNTER — Telehealth: Payer: Self-pay | Admitting: Adult Health

## 2015-03-27 NOTE — Telephone Encounter (Signed)
Left message US looks good

## 2015-04-02 ENCOUNTER — Telehealth: Payer: Self-pay | Admitting: Adult Health

## 2015-04-02 NOTE — Telephone Encounter (Signed)
Left message x 1. JSY 

## 2015-04-02 NOTE — Telephone Encounter (Signed)
Spoke with pt. Pt was concerned [facility] at 25.9. Normal range is 26.6-33.0. I let pt know its not terribly low and its nothing to be concerned with at this time. Pt voiced understanding. JSY

## 2015-05-27 ENCOUNTER — Encounter: Payer: Self-pay | Admitting: Adult Health

## 2015-05-27 ENCOUNTER — Ambulatory Visit (INDEPENDENT_AMBULATORY_CARE_PROVIDER_SITE_OTHER): Payer: 59 | Admitting: Adult Health

## 2015-05-27 VITALS — BP 140/78 | HR 88 | Ht 65.0 in | Wt 263.0 lb

## 2015-05-27 DIAGNOSIS — N926 Irregular menstruation, unspecified: Secondary | ICD-10-CM

## 2015-05-27 DIAGNOSIS — Z3201 Encounter for pregnancy test, result positive: Secondary | ICD-10-CM | POA: Diagnosis not present

## 2015-05-27 DIAGNOSIS — Z3490 Encounter for supervision of normal pregnancy, unspecified, unspecified trimester: Secondary | ICD-10-CM

## 2015-05-27 DIAGNOSIS — O3680X0 Pregnancy with inconclusive fetal viability, not applicable or unspecified: Secondary | ICD-10-CM

## 2015-05-27 HISTORY — DX: Encounter for supervision of normal pregnancy, unspecified, unspecified trimester: Z34.90

## 2015-05-27 LAB — POCT URINE PREGNANCY: Preg Test, Ur: POSITIVE — AB

## 2015-05-27 MED ORDER — PRENATAL PLUS 27-1 MG PO TABS: 1.0000 | ORAL_TABLET | Freq: Every day | ORAL | Status: DC

## 2015-05-27 NOTE — Patient Instructions (Signed)
First Trimester of Pregnancy The first trimester of pregnancy is from week 1 until the end of week 12 (months 1 through 3). A week after a sperm fertilizes an egg, the egg will implant on the wall of the uterus. This embryo will begin to develop into a baby. Genes from you and your partner are forming the baby. The female genes determine whether the baby is a boy or a girl. At 6-8 weeks, the eyes and face are formed, and the heartbeat can be seen on ultrasound. At the end of 12 weeks, all the baby's organs are formed.  Now that you are pregnant, you will want to do everything you can to have a healthy baby. Two of the most important things are to get good prenatal care and to follow your health care provider's instructions. Prenatal care is all the medical care you receive before the baby's birth. This care will help prevent, find, and treat any problems during the pregnancy and childbirth. BODY CHANGES Your body goes through many changes during pregnancy. The changes vary from woman to woman.   You may gain or lose a couple of pounds at first.  You may feel sick to your stomach (nauseous) and throw up (vomit). If the vomiting is uncontrollable, call your health care provider.  You may tire easily.  You may develop headaches that can be relieved by medicines approved by your health care provider.  You may urinate more often. Painful urination may mean you have a bladder infection.  You may develop heartburn as a result of your pregnancy.  You may develop constipation because certain hormones are causing the muscles that push waste through your intestines to slow down.  You may develop hemorrhoids or swollen, bulging veins (varicose veins).  Your breasts may begin to grow larger and become tender. Your nipples may stick out more, and the tissue that surrounds them (areola) may become darker.  Your gums may bleed and may be sensitive to brushing and flossing.  Dark spots or blotches (chloasma,  mask of pregnancy) may develop on your face. This will likely fade after the baby is born.  Your menstrual periods will stop.  You may have a loss of appetite.  You may develop cravings for certain kinds of food.  You may have changes in your emotions from day to day, such as being excited to be pregnant or being concerned that something may go wrong with the pregnancy and baby.  You may have more vivid and strange dreams.  You may have changes in your hair. These can include thickening of your hair, rapid growth, and changes in texture. Some women also have hair loss during or after pregnancy, or hair that feels dry or thin. Your hair will most likely return to normal after your baby is born. WHAT TO EXPECT AT YOUR PRENATAL VISITS During a routine prenatal visit:  You will be weighed to make sure you and the baby are growing normally.  Your blood pressure will be taken.  Your abdomen will be measured to track your baby's growth.  The fetal heartbeat will be listened to starting around week 10 or 12 of your pregnancy.  Test results from any previous visits will be discussed. Your health care provider may ask you:  How you are feeling.  If you are feeling the baby move.  If you have had any abnormal symptoms, such as leaking fluid, bleeding, severe headaches, or abdominal cramping.  If you have any questions. Other tests   that may be performed during your first trimester include:  Blood tests to find your blood type and to check for the presence of any previous infections. They will also be used to check for low iron levels (anemia) and Rh antibodies. Later in the pregnancy, blood tests for diabetes will be done along with other tests if problems develop.  Urine tests to check for infections, diabetes, or protein in the urine.  An ultrasound to confirm the proper growth and development of the baby.  An amniocentesis to check for possible genetic problems.  Fetal screens for  spina bifida and Down syndrome.  You may need other tests to make sure you and the baby are doing well. HOME CARE INSTRUCTIONS  Medicines  Follow your health care provider's instructions regarding medicine use. Specific medicines may be either safe or unsafe to take during pregnancy.  Take your prenatal vitamins as directed.  If you develop constipation, try taking a stool softener if your health care provider approves. Diet  Eat regular, well-balanced meals. Choose a variety of foods, such as meat or vegetable-based protein, fish, milk and low-fat dairy products, vegetables, fruits, and whole grain breads and cereals. Your health care provider will help you determine the amount of weight gain that is right for you.  Avoid raw meat and uncooked cheese. These carry germs that can cause birth defects in the baby.  Eating four or five small meals rather than three large meals a day may help relieve nausea and vomiting. If you start to feel nauseous, eating a few soda crackers can be helpful. Drinking liquids between meals instead of during meals also seems to help nausea and vomiting.  If you develop constipation, eat more high-fiber foods, such as fresh vegetables or fruit and whole grains. Drink enough fluids to keep your urine clear or pale yellow. Activity and Exercise  Exercise only as directed by your health care provider. Exercising will help you:  Control your weight.  Stay in shape.  Be prepared for labor and delivery.  Experiencing pain or cramping in the lower abdomen or low back is a good sign that you should stop exercising. Check with your health care provider before continuing normal exercises.  Try to avoid standing for long periods of time. Move your legs often if you must stand in one place for a long time.  Avoid heavy lifting.  Wear low-heeled shoes, and practice good posture.  You may continue to have sex unless your health care provider directs you  otherwise. Relief of Pain or Discomfort  Wear a good support bra for breast tenderness.   Take warm sitz baths to soothe any pain or discomfort caused by hemorrhoids. Use hemorrhoid cream if your health care provider approves.   Rest with your legs elevated if you have leg cramps or low back pain.  If you develop varicose veins in your legs, wear support hose. Elevate your feet for 15 minutes, 3-4 times a day. Limit salt in your diet. Prenatal Care  Schedule your prenatal visits by the twelfth week of pregnancy. They are usually scheduled monthly at first, then more often in the last 2 months before delivery.  Write down your questions. Take them to your prenatal visits.  Keep all your prenatal visits as directed by your health care provider. Safety  Wear your seat belt at all times when driving.  Make a list of emergency phone numbers, including numbers for family, friends, the hospital, and police and fire departments. General Tips    Ask your health care provider for a referral to a local prenatal education class. Begin classes no later than at the beginning of month 6 of your pregnancy.  Ask for help if you have counseling or nutritional needs during pregnancy. Your health care provider can offer advice or refer you to specialists for help with various needs.  Do not use hot tubs, steam rooms, or saunas.  Do not douche or use tampons or scented sanitary pads.  Do not cross your legs for long periods of time.  Avoid cat litter boxes and soil used by cats. These carry germs that can cause birth defects in the baby and possibly loss of the fetus by miscarriage or stillbirth.  Avoid all smoking, herbs, alcohol, and medicines not prescribed by your health care provider. Chemicals in these affect the formation and growth of the baby.  Schedule a dentist appointment. At home, brush your teeth with a soft toothbrush and be gentle when you floss. SEEK MEDICAL CARE IF:   You have  dizziness.  You have mild pelvic cramps, pelvic pressure, or nagging pain in the abdominal area.  You have persistent nausea, vomiting, or diarrhea.  You have a bad smelling vaginal discharge.  You have pain with urination.  You notice increased swelling in your face, hands, legs, or ankles. SEEK IMMEDIATE MEDICAL CARE IF:   You have a fever.  You are leaking fluid from your vagina.  You have spotting or bleeding from your vagina.  You have severe abdominal cramping or pain.  You have rapid weight gain or loss.  You vomit blood or material that looks like coffee grounds.  You are exposed to German measles and have never had them.  You are exposed to fifth disease or chickenpox.  You develop a severe headache.  You have shortness of breath.  You have any kind of trauma, such as from a fall or a car accident. Document Released: 11/02/2001 Document Revised: 03/25/2014 Document Reviewed: 09/18/2013 ExitCare Patient Information 2015 [employer]. This information is not intended to replace advice given to you by your health care provider. Make sure you discuss any questions you have with your health care provider. Return in 1 week for dating US 

## 2015-05-27 NOTE — Progress Notes (Signed)
Subjective:     Patient ID: Patricia [name], female   DOB: July 27, 1991, 24 y.o.   MRN: [id number]  HPI Patricia [name] is a 24 year old black female in for UPT, has missed a period and has some cramps, no bleeding or vomiting, some nausea.  Review of Systems Patient denies any headaches, hearing loss, fatigue, blurred vision, shortness of breath, chest pain, abdominal pain, problems with bowel movements, urination, or intercourse. No joint pain or mood swings.+cramps Reviewed past medical,surgical, social and family history. Reviewed medications and allergies.     Objective:   Physical Exam BP 140/78 mmHg  Pulse 88  Ht 5\' 5"  (1.651 m)  Wt 263 lb (119.296 kg)  BMI 43.77 kg/m2  LMP 04/13/2015 UPT + about 6+2 weeks by LMP EDD 01/18/16, Skin warm and dry. Neck: mid line trachea, normal thyroid, good ROM, no lymphadenopathy noted. Lungs: clear to ausculation bilaterally. Cardiovascular: regular rate and rhythm.Abdomen soft, non tender    Assessment:     Pregnant     Plan:     Rx prenatal plus #30 take 1 daily with 11 refills Return in 1 week for dating US Review handout on first trimester and new OB packet given

## 2015-05-29 ENCOUNTER — Telehealth: Payer: Self-pay | Admitting: Adult Health

## 2015-05-29 NOTE — Telephone Encounter (Signed)
Left message x 1. JSY 

## 2015-05-29 NOTE — Telephone Encounter (Signed)
Spoke with pt. Pt states her reproductive endocrinologist wants to do an US tomorrow. She is scheduled for an US at our office 7/12. Is this ok? Please advise. Thanks!! JSY

## 2015-05-29 NOTE — Telephone Encounter (Signed)
Ok to get US tomorrow if normal lets us know and can change next weeks

## 2015-06-03 ENCOUNTER — Ambulatory Visit (INDEPENDENT_AMBULATORY_CARE_PROVIDER_SITE_OTHER): Payer: 59

## 2015-06-03 DIAGNOSIS — O3680X0 Pregnancy with inconclusive fetal viability, not applicable or unspecified: Secondary | ICD-10-CM | POA: Diagnosis not present

## 2015-06-03 NOTE — Progress Notes (Addendum)
[place] 6+4 wks single IUP w/ys,fht 123 bpm,normal ov's bilat,crl 7.746mm

## 2015-06-11 ENCOUNTER — Encounter: Payer: Self-pay | Admitting: Advanced Practice Midwife

## 2015-06-11 ENCOUNTER — Ambulatory Visit (INDEPENDENT_AMBULATORY_CARE_PROVIDER_SITE_OTHER): Payer: 59 | Admitting: Advanced Practice Midwife

## 2015-06-11 VITALS — HR 96 | Wt 260.0 lb

## 2015-06-11 DIAGNOSIS — Z1389 Encounter for screening for other disorder: Secondary | ICD-10-CM

## 2015-06-11 DIAGNOSIS — Z369 Encounter for antenatal screening, unspecified: Secondary | ICD-10-CM

## 2015-06-11 DIAGNOSIS — Z3682 Encounter for antenatal screening for nuchal translucency: Secondary | ICD-10-CM

## 2015-06-11 DIAGNOSIS — Z3491 Encounter for supervision of normal pregnancy, unspecified, first trimester: Secondary | ICD-10-CM | POA: Diagnosis not present

## 2015-06-11 DIAGNOSIS — Z331 Pregnant state, incidental: Secondary | ICD-10-CM

## 2015-06-11 DIAGNOSIS — Z0283 Encounter for blood-alcohol and blood-drug test: Secondary | ICD-10-CM

## 2015-06-11 DIAGNOSIS — Z3490 Encounter for supervision of normal pregnancy, unspecified, unspecified trimester: Secondary | ICD-10-CM | POA: Insufficient documentation

## 2015-06-11 MED ORDER — COMPLETE NATAL DHA 29-1-200 & 250 MG PO MISC: 1.0000 | Freq: Every day | ORAL | Status: DC

## 2015-06-11 NOTE — Progress Notes (Signed)
  Subjective:    Patricia [name] is a G2P0010 2651w3d being seen today for her first obstetrical visit.  Her obstetrical history is significant for 1st trimester SAB.  Has monthly periods, had been on metformin but quit before conception..  Pregnancy history fully reviewed.  Patient reports no complaints.  Filed Vitals:   06/11/15 1411  Pulse: 96    HISTORY: OB History  Gravida Para Term Preterm AB SAB TAB Ectopic Multiple Living  2    1 1         # Outcome Date GA Lbr Len/2nd Weight Sex Delivery Anes PTL Lv  2 Current           1 SAB              Past Medical History  Diagnosis Date  . Polycystic disease, ovaries   . Migraine   . Migraines   . PCO (polycystic ovaries) 03/08/2014  . Pregnant 05/27/2015   Past Surgical History  Procedure Laterality Date  . Tonsillectomy     Family History  Problem Relation Age of Onset  . Migraines Sister   . Hypertension Brother   . Heart disease Maternal Grandmother   . Cancer Maternal Grandfather     prostate  . Stroke Paternal Grandmother   . Cancer Paternal Grandfather     prostate  . Stroke Paternal Grandfather      Exam       Pelvic Exam:    Perineum: Normal Perineum   Vulva: normal   Vagina:  normal mucosa, normal discharge, no palpable nodules   Uterus Normal, Gravid, FH: 8             Urinary:  urethral meatus normal    System:     Skin: normal coloration and turgor, no rashes    Neurologic: oriented, normal, normal mood   Extremities: normal strength, tone, and muscle mass   HEENT PERRLA   Mouth/Teeth mucous membranes moist, normal dentition   Neck supple and no masses   Cardiovascular: regular rate and rhythm   Respiratory:  appears well, vitals normal, no respiratory distress, acyanotic   Abdomen: soft, non-tender;  FHR: 150us          Assessment:    Pregnancy: G2P0010 Patient Active Problem List   Diagnosis Date Noted  . Supervision of normal pregnancy 06/11/2015  . Pregnant 05/27/2015  . Morbid  obesity 09/23/2014  . Dizziness and giddiness 09/23/2014  . Headache 09/23/2014  . PCO (polycystic ovaries) 03/08/2014        Plan:     Initial labs drawn. Continue prenatal vitamins  Problem list reviewed and updated  Reviewed n/v relief measures and warning s/s to report  Reviewed recommended weight gain based on pre-gravid BMI  Encouraged well-balanced diet Genetic Screening discussed Integrated Screen: requested.  Ultrasound discussed; fetal survey: requested.  Follow up in 4 weeks for NT/IT.  CRESENZO-DISHMAN,Shamiracle Gorden 06/11/2015

## 2015-06-11 NOTE — Patient Instructions (Signed)
Safe Medications in Pregnancy  ° °Acne: °Benzoyl Peroxide °Salicylic Acid ° °Backache/Headache: °Tylenol: 2 regular strength every 4 hours OR °             2 Extra strength every 6 hours ° °Colds/Coughs/Allergies: °Benadryl (alcohol free) 25 mg every 6 hours as needed °Breath right strips °Claritin °Cepacol throat lozenges °Chloraseptic throat spray °Cold-Eeze- up to three times per day °Cough drops, alcohol free °Flonase (by prescription only) °Guaifenesin °Mucinex °Robitussin DM (plain only, alcohol free) °Saline nasal spray/drops °Sudafed (pseudoephedrine) & Actifed ** use only after [redacted] weeks gestation and if you do not have high blood pressure °Tylenol °Vicks Vaporub °Zinc lozenges °Zyrtec  ° °Constipation: °Colace °Ducolax suppositories °Fleet enema °Glycerin suppositories °Metamucil °Milk of magnesia °Miralax °Senokot °Smooth move tea ° °Diarrhea: °Kaopectate °Imodium A-D ° °*NO pepto Bismol ° °Hemorrhoids: °Anusol °Anusol HC °Preparation H °Tucks ° °Indigestion: °Tums °Maalox °Mylanta °Zantac  °Pepcid ° °Insomnia: °Benadryl (alcohol free) 25mg every 6 hours as needed °Tylenol PM °Unisom, no Gelcaps ° °Leg Cramps: °Tums °MagGel ° °Nausea/Vomiting:  °Bonine °Dramamine °Emetrol °Ginger extract °Sea bands °Meclizine  °Nausea medication to take during pregnancy:  °Unisom (doxylamine succinate 25 mg tablets) Take one tablet daily at bedtime. If symptoms are not adequately controlled, the dose can be increased to a maximum recommended dose of two tablets daily (1/2 tablet in the morning, 1/2 tablet mid-afternoon and one at bedtime). °Vitamin B6 100mg tablets. Take one tablet twice a day (up to 200 mg per day). ° °Skin Rashes: °Aveeno products °Benadryl cream or 25mg every 6 hours as needed °Calamine Lotion °1% cortisone cream ° °Yeast infection: °Gyne-lotrimin 7 °Monistat 7 ° ° °**If taking multiple medications, please check labels to avoid duplicating the same active ingredients °**take medication as directed on  the label °** Do not exceed 4000 mg of tylenol in 24 hours °**Do not take medications that contain aspirin or ibuprofen ° ° ° °First Trimester of Pregnancy °The first trimester of pregnancy is from week 1 until the end of week 12 (months 1 through 3). A week after a sperm fertilizes an egg, the egg will implant on the wall of the uterus. This embryo will begin to develop into a baby. Genes from you and your partner are forming the baby. The female genes determine whether the baby is a boy or a girl. At 6-8 weeks, the eyes and face are formed, and the heartbeat can be seen on ultrasound. At the end of 12 weeks, all the baby's organs are formed.  °Now that you are pregnant, you will want to do everything you can to have a healthy baby. Two of the most important things are to get good prenatal care and to follow your health care provider's instructions. Prenatal care is all the medical care you receive before the baby's birth. This care will help prevent, find, and treat any problems during the pregnancy and childbirth. °BODY CHANGES °Your body goes through many changes during pregnancy. The changes vary from woman to woman.  °· You may gain or lose a couple of pounds at first. °· You may feel sick to your stomach (nauseous) and throw up (vomit). If the vomiting is uncontrollable, call your health care provider. °· You may tire easily. °· You may develop headaches that can be relieved by medicines approved by your health care provider. °· You may urinate more often. Painful urination may mean you have a bladder infection. °· You may develop heartburn as a result of your   pregnancy. °· You may develop constipation because certain hormones are causing the muscles that push waste through your intestines to slow down. °· You may develop hemorrhoids or swollen, bulging veins (varicose veins). °· Your breasts may begin to grow larger and become tender. Your nipples may stick out more, and the tissue that surrounds them (areola)  may become darker. °· Your gums may bleed and may be sensitive to brushing and flossing. °· Dark spots or blotches (chloasma, mask of pregnancy) may develop on your face. This will likely fade after the baby is born. °· Your menstrual periods will stop. °· You may have a loss of appetite. °· You may develop cravings for certain kinds of food. °· You may have changes in your emotions from day to day, such as being excited to be pregnant or being concerned that something may go wrong with the pregnancy and baby. °· You may have more vivid and strange dreams. °· You may have changes in your hair. These can include thickening of your hair, rapid growth, and changes in texture. Some women also have hair loss during or after pregnancy, or hair that feels dry or thin. Your hair will most likely return to normal after your baby is born. °WHAT TO EXPECT AT YOUR PRENATAL VISITS °During a routine prenatal visit: °· You will be weighed to make sure you and the baby are growing normally. °· Your blood pressure will be taken. °· Your abdomen will be measured to track your baby's growth. °· The fetal heartbeat will be listened to starting around week 10 or 12 of your pregnancy. °· Test results from any previous visits will be discussed. °Your health care provider may ask you: °· How you are feeling. °· If you are feeling the baby move. °· If you have had any abnormal symptoms, such as leaking fluid, bleeding, severe headaches, or abdominal cramping. °· If you have any questions. °Other tests that may be performed during your first trimester include: °· Blood tests to find your blood type and to check for the presence of any previous infections. They will also be used to check for low iron levels (anemia) and Rh antibodies. Later in the pregnancy, blood tests for diabetes will be done along with other tests if problems develop. °· Urine tests to check for infections, diabetes, or protein in the urine. °· An ultrasound to confirm  the proper growth and development of the baby. °· An amniocentesis to check for possible genetic problems. °· Fetal screens for spina bifida and Down syndrome. °· You may need other tests to make sure you and the baby are doing well. °HOME CARE INSTRUCTIONS  °Medicines °· Follow your health care provider's instructions regarding medicine use. Specific medicines may be either safe or unsafe to take during pregnancy. °· Take your prenatal vitamins as directed. °· If you develop constipation, try taking a stool softener if your health care provider approves. °Diet °· Eat regular, well-balanced meals. Choose a variety of foods, such as meat or vegetable-based protein, fish, milk and low-fat dairy products, vegetables, fruits, and whole grain breads and cereals. Your health care provider will help you determine the amount of weight gain that is right for you. °· Avoid raw meat and uncooked cheese. These carry germs that can cause birth defects in the baby. °· Eating four or five small meals rather than three large meals a day may help relieve nausea and vomiting. If you start to feel nauseous, eating a few soda crackers   can be helpful. Drinking liquids between meals instead of during meals also seems to help nausea and vomiting. °· If you develop constipation, eat more high-fiber foods, such as fresh vegetables or fruit and whole grains. Drink enough fluids to keep your urine clear or pale yellow. °Activity and Exercise °· Exercise only as directed by your health care provider. Exercising will help you: °¨ Control your weight. °¨ Stay in shape. °¨ Be prepared for labor and delivery. °· Experiencing pain or cramping in the lower abdomen or low back is a good sign that you should stop exercising. Check with your health care provider before continuing normal exercises. °· Try to avoid standing for long periods of time. Move your legs often if you must stand in one place for a long time. °· Avoid heavy lifting. °· Wear  low-heeled shoes, and practice good posture. °· You may continue to have sex unless your health care provider directs you otherwise. °Relief of Pain or Discomfort °· Wear a good support bra for breast tenderness.   °· Take warm sitz baths to soothe any pain or discomfort caused by hemorrhoids. Use hemorrhoid cream if your health care provider approves.   °· Rest with your legs elevated if you have leg cramps or low back pain. °· If you develop varicose veins in your legs, wear support hose. Elevate your feet for 15 minutes, 3-4 times a day. Limit salt in your diet. °Prenatal Care °· Schedule your prenatal visits by the twelfth week of pregnancy. They are usually scheduled monthly at first, then more often in the last 2 months before delivery. °· Write down your questions. Take them to your prenatal visits. °· Keep all your prenatal visits as directed by your health care provider. °Safety °· Wear your seat belt at all times when driving. °· Make a list of emergency phone numbers, including numbers for family, friends, the hospital, and police and fire departments. °General Tips °· Ask your health care provider for a referral to a local prenatal education class. Begin classes no later than at the beginning of month 6 of your pregnancy. °· Ask for help if you have counseling or nutritional needs during pregnancy. Your health care provider can offer advice or refer you to specialists for help with various needs. °· Do not use hot tubs, steam rooms, or saunas. °· Do not douche or use tampons or scented sanitary pads. °· Do not cross your legs for long periods of time. °· Avoid cat litter boxes and soil used by cats. These carry germs that can cause birth defects in the baby and possibly loss of the fetus by miscarriage or stillbirth. °· Avoid all smoking, herbs, alcohol, and medicines not prescribed by your health care provider. Chemicals in these affect the formation and growth of the baby. °· Schedule a dentist  appointment. At home, brush your teeth with a soft toothbrush and be gentle when you floss. °SEEK MEDICAL CARE IF:  °· You have dizziness. °· You have mild pelvic cramps, pelvic pressure, or nagging pain in the abdominal area. °· You have persistent nausea, vomiting, or diarrhea. °· You have a bad smelling vaginal discharge. °· You have pain with urination. °· You notice increased swelling in your face, hands, legs, or ankles. °SEEK IMMEDIATE MEDICAL CARE IF:  °· You have a fever. °· You are leaking fluid from your vagina. °· You have spotting or bleeding from your vagina. °· You have severe abdominal cramping or pain. °· You have rapid weight gain   or loss. °· You vomit blood or material that looks like coffee grounds. °· You are exposed to German measles and have never had them. °· You are exposed to fifth disease or chickenpox. °· You develop a severe headache. °· You have shortness of breath. °· You have any kind of trauma, such as from a fall or a car accident. °Document Released: 11/02/2001 Document Revised: 03/25/2014 Document Reviewed: 09/18/2013 °ExitCare® Patient Information ©2015 [employer]. This information is not intended to replace advice given to you by your health care provider. Make sure you discuss any questions you have with your health care provider. ° °

## 2015-06-12 LAB — URINE CULTURE

## 2015-06-13 LAB — GC/CHLAMYDIA PROBE AMP
CHLAMYDIA, DNA PROBE: NEGATIVE
Neisseria gonorrhoeae by PCR: NEGATIVE

## 2015-06-17 LAB — PMP SCREEN PROFILE (10S), URINE
AMPHETAMINE SCRN UR: NEGATIVE ng/mL
BENZODIAZEPINE SCREEN, URINE: NEGATIVE ng/mL
Barbiturate Screen, Ur: NEGATIVE ng/mL
CANNABINOIDS UR QL SCN: POSITIVE ng/mL
Cocaine(Metab.)Screen, Urine: NEGATIVE ng/mL
Creatinine(Crt), U: 139.8 mg/dL (ref 20.0–300.0)
Methadone Scn, Ur: NEGATIVE ng/mL
Opiate Scrn, Ur: NEGATIVE ng/mL
Oxycodone+Oxymorphone Ur Ql Scn: NEGATIVE ng/mL
PCP Scrn, Ur: NEGATIVE ng/mL
PROPOXYPHENE SCREEN: NEGATIVE ng/mL
Ph of Urine: 6.2 (ref 4.5–8.9)

## 2015-06-17 LAB — URINALYSIS, ROUTINE W REFLEX MICROSCOPIC
BILIRUBIN UA: NEGATIVE
Glucose, UA: NEGATIVE
Ketones, UA: NEGATIVE
Leukocytes, UA: NEGATIVE
Nitrite, UA: NEGATIVE
Protein, UA: NEGATIVE
RBC, UA: NEGATIVE
SPEC GRAV UA: 1.023 (ref 1.005–1.030)
Urobilinogen, Ur: 0.2 mg/dL (ref 0.2–1.0)
pH, UA: 6.5 (ref 5.0–7.5)

## 2015-06-17 LAB — SICKLE CELL SCREEN: Sickle Cell Screen: NEGATIVE

## 2015-06-17 LAB — CBC
HEMATOCRIT: 34.8 % (ref 34.0–46.6)
Hemoglobin: 11.5 g/dL (ref 11.1–15.9)
MCH: 26 pg — AB (ref 26.6–33.0)
MCHC: 33 g/dL (ref 31.5–35.7)
MCV: 79 fL (ref 79–97)
Platelets: 378 10*3/uL (ref 150–379)
RBC: 4.42 x10E6/uL (ref 3.77–5.28)
RDW: 14.2 % (ref 12.3–15.4)
WBC: 11.6 10*3/uL — AB (ref 3.4–10.8)

## 2015-06-17 LAB — RPR: RPR Ser Ql: NONREACTIVE

## 2015-06-17 LAB — HIV ANTIBODY (ROUTINE TESTING W REFLEX): HIV Screen 4th Generation wRfx: NONREACTIVE

## 2015-06-17 LAB — CYSTIC FIBROSIS MUTATION 97: Interpretation: NOT DETECTED

## 2015-06-17 LAB — ANTIBODY SCREEN: Antibody Screen: NEGATIVE

## 2015-06-17 LAB — VARICELLA ZOSTER ANTIBODY, IGG: Varicella zoster IgG: 1663 index (ref >165)

## 2015-06-17 LAB — HEPATITIS B SURFACE ANTIGEN: Hepatitis B Surface Ag: NEGATIVE

## 2015-06-17 LAB — ABO/RH: Rh Factor: POSITIVE

## 2015-06-17 LAB — RUBELLA SCREEN: RUBELLA: 5 {index} (ref >0.99)

## 2015-06-18 ENCOUNTER — Encounter: Payer: Self-pay | Admitting: Advanced Practice Midwife

## 2015-06-18 DIAGNOSIS — F1290 Cannabis use, unspecified, uncomplicated: Secondary | ICD-10-CM | POA: Insufficient documentation

## 2015-06-20 ENCOUNTER — Telehealth: Payer: Self-pay | Admitting: *Deleted

## 2015-06-20 NOTE — Telephone Encounter (Signed)
Pt states her tooth cracked this morning unable to get into the dentist today, what can she take for pain. Pt informed she can take OTC Tylenol. Pt verbalized understanding.

## 2015-06-25 DIAGNOSIS — Z029 Encounter for administrative examinations, unspecified: Secondary | ICD-10-CM

## 2015-06-27 ENCOUNTER — Telehealth: Payer: Self-pay | Admitting: *Deleted

## 2015-06-27 NOTE — Telephone Encounter (Signed)
Spoke with Patricia [name] letting her know Darene Lamer was safe to use during pregnancy for occ use per Dr. Emelda Fear. JSY

## 2015-07-09 ENCOUNTER — Ambulatory Visit (INDEPENDENT_AMBULATORY_CARE_PROVIDER_SITE_OTHER): Payer: 59

## 2015-07-09 ENCOUNTER — Ambulatory Visit (INDEPENDENT_AMBULATORY_CARE_PROVIDER_SITE_OTHER): Payer: 59 | Admitting: Advanced Practice Midwife

## 2015-07-09 ENCOUNTER — Encounter: Payer: Self-pay | Admitting: Advanced Practice Midwife

## 2015-07-09 VITALS — BP 120/80 | HR 80 | Wt 265.0 lb

## 2015-07-09 DIAGNOSIS — Z331 Pregnant state, incidental: Secondary | ICD-10-CM

## 2015-07-09 DIAGNOSIS — Z3491 Encounter for supervision of normal pregnancy, unspecified, first trimester: Secondary | ICD-10-CM

## 2015-07-09 DIAGNOSIS — F1290 Cannabis use, unspecified, uncomplicated: Secondary | ICD-10-CM

## 2015-07-09 DIAGNOSIS — Z36 Encounter for antenatal screening of mother: Secondary | ICD-10-CM

## 2015-07-09 DIAGNOSIS — Z3682 Encounter for antenatal screening for nuchal translucency: Secondary | ICD-10-CM

## 2015-07-09 DIAGNOSIS — Z1389 Encounter for screening for other disorder: Secondary | ICD-10-CM

## 2015-07-09 LAB — POCT URINALYSIS DIPSTICK
Blood, UA: NEGATIVE
Glucose, UA: NEGATIVE
KETONES UA: NEGATIVE
Leukocytes, UA: NEGATIVE
Nitrite, UA: NEGATIVE
Protein, UA: NEGATIVE

## 2015-07-09 NOTE — Progress Notes (Signed)
[place] 12+3wks single IUP pos fht 160 bpm,nt 1.72mm,nb present,crl 61.76mm,normal ov's bilat

## 2015-07-09 NOTE — Progress Notes (Signed)
G2P0010 [redacted]w[redacted]d Estimated Date of Delivery: 01/18/16  Blood pressure 120/80, pulse 80, weight 265 lb (120.203 kg), last menstrual period 04/13/2015.   BP weight and urine results all reviewed and noted.  Please refer to the obstetrical flow sheet for the fundal height and fetal heart rate documentation:  Patient reports good fetal movement, denies any bleeding and no rupture of membranes symptoms or regular contractions. Patient is without complaints. All questions were answered.  Plan:  Continued routine obstetrical care, NT/IT today Going to [place] in a few weeks.  Use DEET.  Follow up in 4 weeks for OB appointment, 2nd IT

## 2015-07-11 LAB — MATERNAL SCREEN, INTEGRATED #1
Crown Rump Length: 61.9 mm
Gest. Age on Collection Date: 12.6 weeks
Maternal Age at EDD: 25.5 years
Nuchal Translucency (NT): 1.6 mm
Number of Fetuses: 1
PAPP-A VALUE: 322.7 ng/mL
Weight: 265 [lb_av]

## 2015-07-16 ENCOUNTER — Ambulatory Visit (INDEPENDENT_AMBULATORY_CARE_PROVIDER_SITE_OTHER): Payer: 59 | Admitting: Advanced Practice Midwife

## 2015-07-16 VITALS — BP 142/88 | HR 84 | Wt 265.0 lb

## 2015-07-16 DIAGNOSIS — O23592 Infection of other part of genital tract in pregnancy, second trimester: Secondary | ICD-10-CM

## 2015-07-16 DIAGNOSIS — Z331 Pregnant state, incidental: Secondary | ICD-10-CM

## 2015-07-16 DIAGNOSIS — Z3A13 13 weeks gestation of pregnancy: Secondary | ICD-10-CM

## 2015-07-16 DIAGNOSIS — Z1389 Encounter for screening for other disorder: Secondary | ICD-10-CM

## 2015-07-16 LAB — POCT URINALYSIS DIPSTICK
Glucose, UA: NEGATIVE
Ketones, UA: NEGATIVE
Nitrite, UA: NEGATIVE
PROTEIN UA: NEGATIVE
RBC UA: NEGATIVE

## 2015-07-16 NOTE — Progress Notes (Signed)
WORK IN to get FMLA papers filled out--needs to take extra rest breaks at work d/t nausea, some vomiting.  Rarely has to leave or call out.  Also needs FMLA to allow time for OB appointments.  Also c/o DC that is sometimes yellow.  No itch or irritaion.  SSE: normal appearing white dc. Wet prep negative. Pt reassured. F/U as scheduled

## 2015-07-29 ENCOUNTER — Telehealth: Payer: Self-pay | Admitting: *Deleted

## 2015-07-29 NOTE — Telephone Encounter (Signed)
Pt states the [employer] requesting pt FMLA forms. Informed pt had refaxed the forms last week. Pt states these were not the correct forms. Pt informed the only forms I have completed has been faxed. Pt to bring in another set of forms to be completed.

## 2015-07-31 ENCOUNTER — Telehealth: Payer: Self-pay | Admitting: Advanced Practice Midwife

## 2015-07-31 NOTE — Telephone Encounter (Signed)
Pt c/o headache all weekend, elevated B/P 130/87, congestion, coughing, nausea. Pt states did take Tylenol for HA helps some but then HA come back and nasal spray for congestion. Pt given an appt for tomorrow 08/01/2015 for evaluation.

## 2015-08-01 ENCOUNTER — Encounter: Payer: Self-pay | Admitting: Adult Health

## 2015-08-01 ENCOUNTER — Ambulatory Visit (INDEPENDENT_AMBULATORY_CARE_PROVIDER_SITE_OTHER): Payer: 59 | Admitting: Adult Health

## 2015-08-01 VITALS — BP 140/64 | HR 100 | Temp 98.5°F | Wt 274.0 lb

## 2015-08-01 DIAGNOSIS — Z331 Pregnant state, incidental: Secondary | ICD-10-CM | POA: Diagnosis not present

## 2015-08-01 DIAGNOSIS — Z1389 Encounter for screening for other disorder: Secondary | ICD-10-CM

## 2015-08-01 DIAGNOSIS — R053 Chronic cough: Secondary | ICD-10-CM | POA: Insufficient documentation

## 2015-08-01 DIAGNOSIS — R05 Cough: Secondary | ICD-10-CM | POA: Diagnosis not present

## 2015-08-01 DIAGNOSIS — O219 Vomiting of pregnancy, unspecified: Secondary | ICD-10-CM

## 2015-08-01 DIAGNOSIS — J069 Acute upper respiratory infection, unspecified: Secondary | ICD-10-CM | POA: Diagnosis not present

## 2015-08-01 DIAGNOSIS — R059 Cough, unspecified: Secondary | ICD-10-CM

## 2015-08-01 HISTORY — DX: Cough, unspecified: R05.9

## 2015-08-01 HISTORY — DX: Vomiting of pregnancy, unspecified: O21.9

## 2015-08-01 HISTORY — DX: Acute upper respiratory infection, unspecified: J06.9

## 2015-08-01 LAB — POCT URINALYSIS DIPSTICK
GLUCOSE UA: NEGATIVE
Glucose, UA: NEGATIVE
KETONES UA: NEGATIVE
Nitrite, UA: NEGATIVE
PROTEIN UA: NEGATIVE
Protein, UA: NEGATIVE
RBC UA: NEGATIVE

## 2015-08-01 MED ORDER — ONDANSETRON 8 MG PO TBDP: 8.0000 mg | ORAL_TABLET | Freq: Three times a day (TID) | ORAL | Status: DC | PRN

## 2015-08-01 NOTE — Progress Notes (Signed)
G2P0010 [redacted]w[redacted]d Estimated Date of Delivery: 01/18/16  Work in appt Blood pressure 140/64, pulse 100, temperature 98.5 F (36.9 C), weight 274 lb (124.286 kg), last menstrual period 04/13/2015.   BP weight and urine results all reviewed and noted.  Please refer to the obstetrical flow sheet for the fundal height and fetal heart rate documentation:FHR 142 by doppler  Patient denies any bleeding and no rupture of membranes symptoms or regular contractions.  Patient is complaining of cough and congestion with headache for 6 days, no fever and cough is dry,also has nausea and vomiting at times.  On exam skin warm and dry, ears clear, throat not red, no pustules, lungs clear and no sinus pressure on palpation.  Try tylenol cold and sinus or zyrtec, any cough drop and increase fluids and rest, and tylenol for headache, will rx zofran for N/V All questions were answered.  Orders Placed This Encounter  Procedures  . POCT Urinalysis Dipstick   Impression: URI and nausea and vomiting  Plan:  Continued routine obstetrical care,  Follow up as scheduled next week

## 2015-08-01 NOTE — Patient Instructions (Signed)
Increase fluids Rest Try tylenol cold/sinus or zyrtec Any cough drop Try zofran for N/V and keep appt next week

## 2015-08-06 ENCOUNTER — Ambulatory Visit (INDEPENDENT_AMBULATORY_CARE_PROVIDER_SITE_OTHER): Payer: 59 | Admitting: Women's Health

## 2015-08-06 ENCOUNTER — Encounter: Payer: Self-pay | Admitting: Women's Health

## 2015-08-06 VITALS — BP 130/64 | HR 72 | Wt 273.0 lb

## 2015-08-06 DIAGNOSIS — N898 Other specified noninflammatory disorders of vagina: Secondary | ICD-10-CM

## 2015-08-06 DIAGNOSIS — Z363 Encounter for antenatal screening for malformations: Secondary | ICD-10-CM

## 2015-08-06 DIAGNOSIS — Z3682 Encounter for antenatal screening for nuchal translucency: Secondary | ICD-10-CM

## 2015-08-06 DIAGNOSIS — O26892 Other specified pregnancy related conditions, second trimester: Secondary | ICD-10-CM

## 2015-08-06 DIAGNOSIS — Z3492 Encounter for supervision of normal pregnancy, unspecified, second trimester: Secondary | ICD-10-CM

## 2015-08-06 DIAGNOSIS — Z1389 Encounter for screening for other disorder: Secondary | ICD-10-CM

## 2015-08-06 DIAGNOSIS — Z1379 Encounter for other screening for genetic and chromosomal anomalies: Secondary | ICD-10-CM

## 2015-08-06 DIAGNOSIS — Z331 Pregnant state, incidental: Secondary | ICD-10-CM

## 2015-08-06 DIAGNOSIS — R300 Dysuria: Secondary | ICD-10-CM

## 2015-08-06 LAB — POCT URINALYSIS DIPSTICK
Blood, UA: NEGATIVE
KETONES UA: NEGATIVE
Nitrite, UA: NEGATIVE
Protein, UA: NEGATIVE

## 2015-08-06 NOTE — Progress Notes (Signed)
Low-risk OB appointment G2P0010 [redacted]w[redacted]d Estimated Date of Delivery: 01/18/16 BP 130/64 mmHg  Pulse 72  Wt 273 lb (123.832 kg)  LMP 04/13/2015 (Exact Date)  BP, weight, and urine reviewed.  Refer to obstetrical flow sheet for FH & FHR.  No fm yet. Denies cramping, lof, vb, or uti s/s. Odorous d/c w/ itching, dysuria x 1wk. Wants informaseq- employee of [employer] so free for her Spec exam: cx visually closed, thin yellow d/c w/ odor, wet prep- many clues, neg trich, rx metronidazole  bid x 7d no sex or etoh Reviewed warning s/s to report. Plan:  Continue routine obstetrical care  F/U in 4wks for OB appointment and anatomy u/s Inform[employer]q instead of 2nd IT today

## 2015-08-06 NOTE — Patient Instructions (Signed)
Second Trimester of Pregnancy The second trimester is from week 13 through week 28, months 4 through 6. The second trimester is often a time when you feel your best. Your body has also adjusted to being pregnant, and you begin to feel better physically. Usually, morning sickness has lessened or quit completely, you may have more energy, and you may have an increase in appetite. The second trimester is also a time when the fetus is growing rapidly. At the end of the sixth month, the fetus is about 9 inches long and weighs about 1 pounds. You will likely begin to feel the baby move (quickening) between 18 and 20 weeks of the pregnancy. BODY CHANGES Your body goes through many changes during pregnancy. The changes vary from woman to woman.   Your weight will continue to increase. You will notice your lower abdomen bulging out.  You may begin to get stretch marks on your hips, abdomen, and breasts.  You may develop headaches that can be relieved by medicines approved by your health care provider.  You may urinate more often because the fetus is pressing on your bladder.  You may develop or continue to have heartburn as a result of your pregnancy.  You may develop constipation because certain hormones are causing the muscles that push waste through your intestines to slow down.  You may develop hemorrhoids or swollen, bulging veins (varicose veins).  You may have back pain because of the weight gain and pregnancy hormones relaxing your joints between the bones in your pelvis and as a result of a shift in weight and the muscles that support your balance.  Your breasts will continue to grow and be tender.  Your gums may bleed and may be sensitive to brushing and flossing.  Dark spots or blotches (chloasma, mask of pregnancy) may develop on your face. This will likely fade after the baby is born.  A dark line from your belly button to the pubic area (linea nigra) may appear. This will likely fade  after the baby is born.  You may have changes in your hair. These can include thickening of your hair, rapid growth, and changes in texture. Some women also have hair loss during or after pregnancy, or hair that feels dry or thin. Your hair will most likely return to normal after your baby is born. WHAT TO EXPECT AT YOUR PRENATAL VISITS During a routine prenatal visit:  You will be weighed to make sure you and the fetus are growing normally.  Your blood pressure will be taken.  Your abdomen will be measured to track your baby's growth.  The fetal heartbeat will be listened to.  Any test results from the previous visit will be discussed. Your health care provider may ask you:  How you are feeling.  If you are feeling the baby move.  If you have had any abnormal symptoms, such as leaking fluid, bleeding, severe headaches, or abdominal cramping.  If you have any questions. Other tests that may be performed during your second trimester include:  Blood tests that check for:  Low iron levels (anemia).  Gestational diabetes (between 24 and 28 weeks).  Rh antibodies.  Urine tests to check for infections, diabetes, or protein in the urine.  An ultrasound to confirm the proper growth and development of the baby.  An amniocentesis to check for possible genetic problems.  Fetal screens for spina bifida and Down syndrome. HOME CARE INSTRUCTIONS   Avoid all smoking, herbs, alcohol, and unprescribed   drugs. These chemicals affect the formation and growth of the baby.  Follow your health care provider's instructions regarding medicine use. There are medicines that are either safe or unsafe to take during pregnancy.  Exercise only as directed by your health care provider. Experiencing uterine cramps is a good sign to stop exercising.  Continue to eat regular, healthy meals.  Wear a good support bra for breast tenderness.  Do not use hot tubs, steam rooms, or saunas.  Wear your  seat belt at all times when driving.  Avoid raw meat, uncooked cheese, cat litter boxes, and soil used by cats. These carry germs that can cause birth defects in the baby.  Take your prenatal vitamins.  Try taking a stool softener (if your health care provider approves) if you develop constipation. Eat more high-fiber foods, such as fresh vegetables or fruit and whole grains. Drink plenty of fluids to keep your urine clear or pale yellow.  Take warm sitz baths to soothe any pain or discomfort caused by hemorrhoids. Use hemorrhoid cream if your health care provider approves.  If you develop varicose veins, wear support hose. Elevate your feet for 15 minutes, 3-4 times a day. Limit salt in your diet.  Avoid heavy lifting, wear low heel shoes, and practice good posture.  Rest with your legs elevated if you have leg cramps or low back pain.  Visit your dentist if you have not gone yet during your pregnancy. Use a soft toothbrush to brush your teeth and be gentle when you floss.  A sexual relationship may be continued unless your health care provider directs you otherwise.  Continue to go to all your prenatal visits as directed by your health care provider. SEEK MEDICAL CARE IF:   You have dizziness.  You have mild pelvic cramps, pelvic pressure, or nagging pain in the abdominal area.  You have persistent nausea, vomiting, or diarrhea.  You have a bad smelling vaginal discharge.  You have pain with urination. SEEK IMMEDIATE MEDICAL CARE IF:   You have a fever.  You are leaking fluid from your vagina.  You have spotting or bleeding from your vagina.  You have severe abdominal cramping or pain.  You have rapid weight gain or loss.  You have shortness of breath with chest pain.  You notice sudden or extreme swelling of your face, hands, ankles, feet, or legs.  You have not felt your baby move in over an hour.  You have severe headaches that do not go away with  medicine.  You have vision changes. Document Released: 11/02/2001 Document Revised: 11/13/2013 Document Reviewed: 01/09/2013 ExitCare Patient Information 2015 [employer]. This information is not intended to replace advice given to you by your health care provider. Make sure you discuss any questions you have with your health care provider.  

## 2015-08-07 ENCOUNTER — Telehealth: Payer: Self-pay | Admitting: *Deleted

## 2015-08-07 NOTE — Telephone Encounter (Signed)
Pt states Rx for Metronidazole for treatment of BV not at [facility]. Verbal order for Metronidazole 500 mg BID x 7 days, #14 given to [facility] per Joellyn Haff, CNM note in Epic from 08/06/2015 office visit. Pt aware.

## 2015-08-08 ENCOUNTER — Inpatient Hospital Stay (HOSPITAL_COMMUNITY)
Admission: AD | Admit: 2015-08-08 | Discharge: 2015-08-08 | Disposition: A | Discharge status: Home / Self Care | Payer: 59 | Source: Ambulatory Visit | Attending: Obstetrics and Gynecology | Admitting: Obstetrics and Gynecology

## 2015-08-08 ENCOUNTER — Encounter (HOSPITAL_COMMUNITY): Payer: Self-pay | Admitting: *Deleted

## 2015-08-08 DIAGNOSIS — N760 Acute vaginitis: Secondary | ICD-10-CM | POA: Diagnosis not present

## 2015-08-08 DIAGNOSIS — Z3A16 16 weeks gestation of pregnancy: Secondary | ICD-10-CM | POA: Diagnosis not present

## 2015-08-08 DIAGNOSIS — O209 Hemorrhage in early pregnancy, unspecified: Secondary | ICD-10-CM | POA: Insufficient documentation

## 2015-08-08 DIAGNOSIS — R102 Pelvic and perineal pain: Secondary | ICD-10-CM | POA: Insufficient documentation

## 2015-08-08 DIAGNOSIS — O4692 Antepartum hemorrhage, unspecified, second trimester: Secondary | ICD-10-CM | POA: Diagnosis not present

## 2015-08-08 DIAGNOSIS — B9689 Other specified bacterial agents as the cause of diseases classified elsewhere: Secondary | ICD-10-CM | POA: Diagnosis not present

## 2015-08-08 DIAGNOSIS — O23592 Infection of other part of genital tract in pregnancy, second trimester: Secondary | ICD-10-CM | POA: Diagnosis not present

## 2015-08-08 LAB — URINALYSIS, ROUTINE W REFLEX MICROSCOPIC
BILIRUBIN URINE: NEGATIVE
Glucose, UA: NEGATIVE mg/dL
Hgb urine dipstick: NEGATIVE
Ketones, ur: NEGATIVE mg/dL
LEUKOCYTES UA: NEGATIVE
NITRITE: NEGATIVE
PH: 7 (ref 5.0–8.0)
Protein, ur: NEGATIVE mg/dL
SPECIFIC GRAVITY, URINE: 1.01 (ref 1.005–1.030)
UROBILINOGEN UA: 0.2 mg/dL (ref 0.0–1.0)

## 2015-08-08 LAB — WET PREP, GENITAL
TRICH WET PREP: NONE SEEN
YEAST WET PREP: NONE SEEN

## 2015-08-08 LAB — URINE CULTURE

## 2015-08-08 NOTE — MAU Note (Signed)
Pt states here for bleeding noted when wiping this am. Does have BV currently, taking flagyl. No pain at present.

## 2015-08-08 NOTE — MAU Note (Signed)
Does have burning when voiding since Sunday

## 2015-08-08 NOTE — Discharge Instructions (Signed)
Vaginal Bleeding During Pregnancy, Second Trimester °A small amount of bleeding (spotting) from the vagina is relatively common in pregnancy. It usually stops on its own. Various things can cause bleeding or spotting in pregnancy. Some bleeding may be related to the pregnancy, and some may not. Sometimes the bleeding is normal and is not a problem. However, bleeding can also be a sign of something serious. Be sure to tell your health care provider about any vaginal bleeding right away. °Some possible causes of vaginal bleeding during the second trimester include: °· Infection, inflammation, or growths on the cervix.   °· The placenta may be partially or completely covering the opening of the cervix inside the uterus (placenta previa). °· The placenta may have separated from the uterus (abruption of the placenta).   °· You may be having early (preterm) labor.   °· The cervix may not be strong enough to keep a baby inside the uterus (cervical insufficiency).   °· Tiny cysts may have developed in the uterus instead of pregnancy tissue (molar pregnancy).  °HOME CARE INSTRUCTIONS  °Watch your condition for any changes. The following actions may help to lessen any discomfort you are feeling: °· Follow your health care provider's instructions for limiting your activity. If your health care provider orders bed rest, you may need to stay in bed and only get up to use the bathroom. However, your health care provider may allow you to continue light activity. °· If needed, make plans for someone to help with your regular activities and responsibilities while you are on bed rest. °· Keep track of the number of pads you use each day, how often you change pads, and how soaked (saturated) they are. Write this down. °· Do not use tampons. Do not douche. °· Do not have sexual intercourse or orgasms until approved by your health care provider. °· If you pass any tissue from your vagina, save the tissue so you can show it to your  health care provider. °· Only take over-the-counter or prescription medicines as directed by your health care provider. °· Do not take aspirin because it can make you bleed. °· Do not exercise or perform any strenuous activities or heavy lifting without your health care provider's permission. °· Keep all follow-up appointments as directed by your health care provider. °SEEK MEDICAL CARE IF: °· You have any vaginal bleeding during any part of your pregnancy. °· You have cramps or labor pains. °· You have a fever, not controlled by medicine. °SEEK IMMEDIATE MEDICAL CARE IF:  °· You have severe cramps in your back or belly (abdomen). °· You have contractions. °· You have chills. °· You pass large clots or tissue from your vagina. °· Your bleeding increases. °· You feel light-headed or weak, or you have fainting episodes. °· You are leaking fluid or have a gush of fluid from your vagina. °MAKE SURE YOU: °· Understand these instructions. °· Will watch your condition. °· Will get help right away if you are not doing well or get worse. °Document Released: 08/18/2005 Document Revised: 11/13/2013 Document Reviewed: 07/16/2013 °ExitCare® Patient Information ©2015 [employer]. This information is not intended to replace advice given to you by your health care provider. Make sure you discuss any questions you have with your health care provider. ° °Bacterial Vaginosis °Bacterial vaginosis is a vaginal infection that occurs when the normal balance of bacteria in the vagina is disrupted. It results from an overgrowth of certain bacteria. This is the most common vaginal infection in women of childbearing   age. Treatment is important to prevent complications, especially in pregnant women, as it can cause a premature delivery. °CAUSES  °Bacterial vaginosis is caused by an increase in harmful bacteria that are normally present in smaller amounts in the vagina. Several different kinds of bacteria can cause bacterial vaginosis.  However, the reason that the condition develops is not fully understood. °RISK FACTORS °Certain activities or behaviors can put you at an increased risk of developing bacterial vaginosis, including: °· Having a new sex partner or multiple sex partners. °· Douching. °· Using an intrauterine device (IUD) for contraception. °Women do not get bacterial vaginosis from toilet seats, bedding, swimming pools, or contact with objects around them. °SIGNS AND SYMPTOMS  °Some women with bacterial vaginosis have no signs or symptoms. Common symptoms include: °· Grey vaginal discharge. °· A fishlike odor with discharge, especially after sexual intercourse. °· Itching or burning of the vagina and vulva. °· Burning or pain with urination. °DIAGNOSIS  °Your health care provider will take a medical history and examine the vagina for signs of bacterial vaginosis. A sample of vaginal fluid may be taken. Your health care provider will look at this sample under a microscope to check for bacteria and abnormal cells. A vaginal pH test may also be done.  °TREATMENT  °Bacterial vaginosis may be treated with antibiotic medicines. These may be given in the form of a pill or a vaginal cream. A second round of antibiotics may be prescribed if the condition comes back after treatment.  °HOME CARE INSTRUCTIONS  °· Only take over-the-counter or prescription medicines as directed by your health care provider. °· If antibiotic medicine was prescribed, take it as directed. Make sure you finish it even if you start to feel better. °· Do not have sex until treatment is completed. °· Tell all sexual partners that you have a vaginal infection. They should see their health care provider and be treated if they have problems, such as a mild rash or itching. °· Practice safe sex by using condoms and only having one sex partner. °SEEK MEDICAL CARE IF:  °· Your symptoms are not improving after 3 days of treatment. °· You have increased discharge or pain. °· You  have a fever. °MAKE SURE YOU:  °· Understand these instructions. °· Will watch your condition. °· Will get help right away if you are not doing well or get worse. °FOR MORE INFORMATION  °Centers for Disease Control and Prevention, Division of STD Prevention: www.cdc.gov/std °American Sexual Health Association (ASHA): www.ashastd.org  °Document Released: 11/08/2005 Document Revised: 08/29/2013 Document Reviewed: 06/20/2013 °ExitCare® Patient Information ©2015 [employer]. This information is not intended to replace advice given to you by your health care provider. Make sure you discuss any questions you have with your health care provider. ° °

## 2015-08-08 NOTE — MAU Provider Note (Signed)
Chief Complaint: Vaginal Bleeding   First Provider Initiated Contact with Patient 08/08/15 [id number]      SUBJECTIVE HPI: Patricia [name] is a 24 y.o. G2P0010 at [redacted]w[redacted]d by LMP pt of [facility] who presents to maternity admissions reporting vaginal bleeding.  She reports onset of pink spotting when wiping this morning. She wore panty liner in to the hospital but reports nothing was on it when she arrived.  She was recently diagnosed with BV in the office and started Flagyl PO yesterday. She also reports some burning with urination starting today.   She denies abdominal pain, vaginal itching/burning, h/a, dizziness, n/v, or fever/chills.     Vaginal Bleeding The patient's primary symptoms include pelvic pain and vaginal bleeding. The patient's pertinent negatives include no vaginal discharge. This is a new problem. The current episode started today. The problem occurs intermittently. The problem has been resolved. The patient is experiencing no pain. She is pregnant. Associated symptoms include dysuria. Pertinent negatives include no abdominal pain, back pain, chills, constipation, diarrhea, fever, flank pain, frequency, headaches, nausea, urgency or vomiting. The vaginal bleeding is spotting. She has not been passing clots. She has not been passing tissue. Nothing aggravates the symptoms. She has tried nothing for the symptoms. She is sexually active. No, her partner does not have an STD.    Past Medical History  Diagnosis Date  . Polycystic disease, ovaries   . Migraine   . Migraines   . PCO (polycystic ovaries) 03/08/2014  . Pregnant 05/27/2015  . URI (upper respiratory infection) 08/01/2015  . Cough 08/01/2015  . Nausea and vomiting during pregnancy prior to [redacted] weeks gestation 08/01/2015   Past Surgical History  Procedure Laterality Date  . Tonsillectomy     Social History   Social History  . Marital Status: Single    Spouse Name: N/A  . Number of Children: N/A  . Years of Education: N/A    Occupational History  . Not on file.   Social History Main Topics  . Smoking status: Never Smoker   . Smokeless tobacco: Never Used  . Alcohol Use: No     Comment: occassional  . Drug Use: No  . Sexual Activity: Yes    Birth Control/ Protection: None   Other Topics Concern  . Not on file   Social History Narrative   No current facility-administered medications on file prior to encounter.   Current Outpatient Prescriptions on File Prior to Encounter  Medication Sig Dispense Refill  . Prenat-FeBis-FePro-FA-CA-Omega (COMPLETE NATAL DHA) 29-1-200 & 250 MG MISC Take 1 tablet by mouth daily. 30 each 11  . ondansetron (ZOFRAN-ODT) 8 MG disintegrating tablet Take 1 tablet (8 mg total) by mouth every 8 (eight) hours as needed for nausea or vomiting. (Patient not taking: Reported on 08/08/2015) 20 tablet 1   Allergies  Allergen Reactions  . Peanut-Containing Drug Products Anaphylaxis and Hives  . Propranolol Shortness Of Breath  . Cephalexin Hives    Keflex  . Imitrex [Sumatriptan] Hives    ROS:  Review of Systems  Constitutional: Negative for fever, chills and fatigue.  HENT: Negative for sinus pressure.   Eyes: Negative for photophobia.  Respiratory: Negative for shortness of breath.   Cardiovascular: Negative for chest pain.  Gastrointestinal: Negative for nausea, vomiting, abdominal pain, diarrhea and constipation.  Genitourinary: Positive for dysuria, vaginal bleeding and pelvic pain. Negative for urgency, frequency, flank pain, vaginal discharge, difficulty urinating and vaginal pain.  Musculoskeletal: Negative for back pain and neck pain.  Neurological: Negative for dizziness, weakness and headaches.  Psychiatric/Behavioral: Negative.      I have reviewed patient's Past Medical Hx, Surgical Hx, Family Hx, Social Hx, medications and allergies.   Physical Exam   Patient Vitals for the past 24 hrs:  BP Temp Temp src Pulse Resp Height Weight  08/08/15 0731 142/85  mmHg 98.3 F (36.8 C) Oral 97 18 5\' 5"  (1.651 m) 124.796 kg (275 lb 2 oz)   Constitutional: Well-developed, well-nourished female in no acute distress.  Cardiovascular: normal rate Respiratory: normal effort GI: Abd soft, non-tender. Pos BS x 4 MS: Extremities nontender, no edema, normal ROM Neurologic: Alert and oriented x 4.  GU: Neg CVAT.  PELVIC EXAM: Cervix pink, visually closed, without lesion, small amount frothy pink discharge, vaginal walls and external genitalia normal Bimanual exam: Cervix 0/long/high, firm, anterior, neg CMT, uterus nontender, nonenlarged, adnexa without tenderness, enlargement, or mass  FHT 145 by doppler  LAB RESULTS Results for orders placed or performed during the hospital encounter of 08/08/15 (from the past 24 hour(s))  Urinalysis, Routine w reflex microscopic (not at [facility])     Status: None   Collection Time: 08/08/15  7:34 AM  Result Value Ref Range   Color, Urine YELLOW YELLOW   APPearance CLEAR CLEAR   Specific Gravity, Urine 1.010 1.005 - 1.030   pH 7.0 5.0 - 8.0   Glucose, UA NEGATIVE NEGATIVE mg/dL   Hgb urine dipstick NEGATIVE NEGATIVE   Bilirubin Urine NEGATIVE NEGATIVE   Ketones, ur NEGATIVE NEGATIVE mg/dL   Protein, ur NEGATIVE NEGATIVE mg/dL   Urobilinogen, UA 0.2 0.0 - 1.0 mg/dL   Nitrite NEGATIVE NEGATIVE   Leukocytes, UA NEGATIVE NEGATIVE  Wet prep, genital     Status: Abnormal   Collection Time: 08/08/15  8:39 AM  Result Value Ref Range   Yeast Wet Prep HPF POC NONE SEEN NONE SEEN   Trich, Wet Prep NONE SEEN NONE SEEN   Clue Cells Wet Prep HPF POC FEW (A) NONE SEEN   WBC, Wet Prep HPF POC FEW (A) NONE SEEN    A/--/-- (07/20 1536)  IMAGING US Fetal Nuchal Translucency Measurement  07/12/2015   NUCHAL TRANSLUCENCY FOR INTEGRATED TESTING   DHANA TOTTON is in the office for nuchal translucency sonogram as part  of an integrated screen.  She is a 24 y.o. year old G2P0010 with Estimated Date of Delivery: 01/18/16  by LMP now  at  [redacted]w[redacted]d weeks gestation. Thus far the pregnancy has been  uncomplicated.  GESTATION: SINGLETON  FETAL ACTIVITY:          Heart rate         160bpm          The fetus is active.  AMNIOTIC FLUID: The amniotic fluid volume is  normal  PLACENTA LOCALIZATION:  anterior GRADE 0  CERVIX: Appears closed  ADNEXA: The ovaries are normal.      GESTATIONAL AGE AND  BIOMETRICS:  Gestational criteria: Estimated Date of Delivery: 01/18/16 by LMP now at  [redacted]w[redacted]d  Previous Scans:1      CROWN RUMP LENGTH           61.9 mm         12+3 weeks  NUCHAL TRANSLUCENCY           1.6 mm         normal  AVERAGE EGA(BY THIS SCAN):   12+3 weeks   The fetal nasal bone is identified.    TECHNICIAN COMMENTS:   [place] 12+3wks single IUP pos fht 160 bpm,nt 1.69mm,nb present,crl  61.75mm,normal ov's bilat   The patient will have the first blood draw of her integrated screening  today and the second draw in approximately 4 weeks.  Amber Flora Lipps 07/09/2015 3:38 PM   Clinical Impression and recommendations:  I have reviewed the sonogram results above, combined with the patient's  current clinical course, below are my impressions and any appropriate  recommendations for management based on the sonographic findings.   1.  G2P0010 Estimated Date of Delivery: 01/18/16 by  LMP, early ultrasound  and confirmed by today's sonographic dating 2.  Normal fetal sonographic findings, specifically normal nuchal  translucency and present fetal nasal bone 3.  Normal general sonographic findings  Recommend routine prenatal care based on this sonogram or as clinically  indicated  EURE,LUTHER H 07/12/2015 7:57 PM                                                                 MAU Management/MDM: Ordered labs and reviewed results.  Cervix is likely source of bleeding related to vaginal infection.  With normal FHT, scant bleeding, and normal VS, pt stable for discharge.   ASSESSMENT 1. Vaginal bleeding in  pregnancy, second trimester   2. Bacterial vaginosis     PLAN Discharge home with bleeding precautions Continue Flagyl Rx as prescribed Pelvic rest while actively bleeding   Follow-up Information    Follow up with [facility].   Why:  As scheduled   Contact information:   [address] [place] [address] [phone]      Follow up with [facility].   Why:  As needed for emergencies   Contact information:   [address] [id number] mc [place] [address] [phone]      Sharen Counter Certified Nurse-Midwife 08/08/2015  8:57 AM

## 2015-08-11 LAB — GC/CHLAMYDIA PROBE AMP (~~LOC~~) NOT AT ARMC
Chlamydia: NEGATIVE
Neisseria Gonorrhea: NEGATIVE

## 2015-08-13 LAB — INFORMASEQ(SM) WITH XY ANALYSIS
FETAL FRACTION (%): 3.9
Fetal Number: 1
Gestational Age at Collection: 16.4 weeks
Weight: 273 [lb_av]

## 2015-08-14 ENCOUNTER — Telehealth: Payer: Self-pay | Admitting: Advanced Practice Midwife

## 2015-08-14 NOTE — Telephone Encounter (Signed)
Pt informed  Informaseq results from 08/06/2015 normal and pt is having a boy per Joellyn Haff, CNM. Pt verbalized understanding.

## 2015-08-20 ENCOUNTER — Telehealth: Payer: Self-pay | Admitting: Women's Health

## 2015-08-20 MED ORDER — NYSTATIN 100000 UNIT/ML MT SUSP: 5.0000 mL | Freq: Four times a day (QID) | OROMUCOSAL | Status: DC | PRN

## 2015-08-20 NOTE — Telephone Encounter (Signed)
Pt c/o thrush, been taking Flagyl. Pt requesting RX.

## 2015-08-20 NOTE — Telephone Encounter (Signed)
Pt called nurse reporting thrush, taking flagyl for BV. Rx nystatin swish & swallow.  Cheral Marker, CNM, [facility] 08/20/2015 3:03 PM

## 2015-09-03 ENCOUNTER — Encounter: Payer: Self-pay | Admitting: Advanced Practice Midwife

## 2015-09-03 ENCOUNTER — Ambulatory Visit (INDEPENDENT_AMBULATORY_CARE_PROVIDER_SITE_OTHER): Payer: 59 | Admitting: Advanced Practice Midwife

## 2015-09-03 ENCOUNTER — Ambulatory Visit (INDEPENDENT_AMBULATORY_CARE_PROVIDER_SITE_OTHER): Payer: 59

## 2015-09-03 VITALS — BP 118/60 | HR 72 | Wt 277.0 lb

## 2015-09-03 DIAGNOSIS — Z363 Encounter for antenatal screening for malformations: Secondary | ICD-10-CM

## 2015-09-03 DIAGNOSIS — Z3492 Encounter for supervision of normal pregnancy, unspecified, second trimester: Secondary | ICD-10-CM

## 2015-09-03 DIAGNOSIS — Z23 Encounter for immunization: Secondary | ICD-10-CM

## 2015-09-03 DIAGNOSIS — Z36 Encounter for antenatal screening of mother: Secondary | ICD-10-CM | POA: Diagnosis not present

## 2015-09-03 DIAGNOSIS — F1290 Cannabis use, unspecified, uncomplicated: Secondary | ICD-10-CM

## 2015-09-03 DIAGNOSIS — Z1389 Encounter for screening for other disorder: Secondary | ICD-10-CM

## 2015-09-03 DIAGNOSIS — Z331 Pregnant state, incidental: Secondary | ICD-10-CM

## 2015-09-03 NOTE — Progress Notes (Signed)
G2P0010 224w3d Estimated Date of Delivery: 01/18/16  Last menstrual period 04/13/2015.   BP weight and urine results all reviewed and noted.  Please refer to the obstetrical flow sheet for the fundal height and fetal heart rate documentation: had anantomy scan today:  US 20+3wks,measurements c/w dates,cx 3.4cm,ant pl,svp of fluid 5.9cm,normal ov's bilat,cephalic,fhr 153 bpm,ant pl gr 0,anatomy complete no obvious abn seen  Patient reports good fetal movement, denies any bleeding and no rupture of membranes symptoms or regular contractions. Patient is without complaints. All questions were answered.  No orders of the defined types were placed in this encounter.    Plan:  Continued routine obstetrical care,   Return in about 4 weeks (around 10/01/2015) for LROB.

## 2015-09-03 NOTE — Progress Notes (Signed)
US 20+3wks,measurements c/w dates,cx 3.4cm,ant pl,svp of fluid 5.9cm,normal ov's bilat,cephalic,fhr 153 bpm,ant pl gr 0,anatomy complete no obvious abn seen

## 2015-09-15 ENCOUNTER — Telehealth: Payer: Self-pay | Admitting: Obstetrics & Gynecology

## 2015-09-15 NOTE — Telephone Encounter (Signed)
Pt states went to Urgent Care, [place], on Saturday 09/13/2015 was given Amoxicillin for Cellulitis started on Sunday. Pt states swelling getting worse. Pt given an appt tomorrow 09/16/2015 with Dr. Emelda FearFerguson for evaluation.

## 2015-09-15 NOTE — Telephone Encounter (Signed)
Pt given an appt for 09/16/2015 for evaluation of Cellulitis.

## 2015-09-16 ENCOUNTER — Ambulatory Visit (INDEPENDENT_AMBULATORY_CARE_PROVIDER_SITE_OTHER): Payer: 59 | Admitting: Obstetrics and Gynecology

## 2015-09-16 ENCOUNTER — Encounter: Payer: Self-pay | Admitting: Obstetrics and Gynecology

## 2015-09-16 VITALS — BP 120/80 | HR 80 | Wt 274.0 lb

## 2015-09-16 DIAGNOSIS — Z3493 Encounter for supervision of normal pregnancy, unspecified, third trimester: Secondary | ICD-10-CM

## 2015-09-16 LAB — POCT URINALYSIS DIPSTICK
Blood, UA: NEGATIVE
GLUCOSE UA: NEGATIVE
KETONES UA: NEGATIVE
Leukocytes, UA: NEGATIVE
NITRITE UA: NEGATIVE
PROTEIN UA: NEGATIVE

## 2015-09-16 NOTE — Progress Notes (Signed)
G2P0010 9562w2d Estimated Date of Delivery: 01/18/16  Blood pressure 120/80, pulse 80, weight 124.286 kg (274 lb), last menstrual period 04/13/2015.  Weight concerns discussed, pt encouraged to seek a 0 pound wt gain from here, and stay active. refer to the ob flow sheet for FH and FHR, also BP, Wt, Urine results:notable for neg.  Patient reports   good fetal movement, denies any bleeding and no rupture of membranes symptoms or regular contractions. Patient complaints:folowup of her right ear inflammation. Her swollen area in hair above right ear has scabbed over, appears mostly healed. Pt to complete antibiotic regimen..  Questions were answered. Assessment:  Healed cellulitis of right ear. Plan:  Continued routine obstetrical care, 2wk chk  F/u in 2 weeks for pnx

## 2015-09-16 NOTE — Progress Notes (Signed)
Pt worked in today for swelling/cellulitis of right side of head/face near her ear. Pt states that she is currently taking antibiotic for this but the swelling had gotten worse but has gone down some this morning. Pt states that she is having cough, and runny nose for about a week and a half.

## 2015-09-30 ENCOUNTER — Telehealth: Payer: Self-pay | Admitting: *Deleted

## 2015-09-30 NOTE — Telephone Encounter (Signed)
Pt states she is having to go to the restroom a lot and having trouble with job performance. Pt states she spoke with her employer and was told she would need to get disability forms completed. Informed pt she would need to discuss with the provider tomorrow at her appt. It would be up to the provider to decide if it was medically necessary for the pt to come out of work at this time. Pt verbalized understanding.

## 2015-10-01 ENCOUNTER — Ambulatory Visit (INDEPENDENT_AMBULATORY_CARE_PROVIDER_SITE_OTHER): Payer: 59 | Admitting: Women's Health

## 2015-10-01 ENCOUNTER — Encounter: Payer: Self-pay | Admitting: Women's Health

## 2015-10-01 ENCOUNTER — Encounter: Payer: 59 | Admitting: Women's Health

## 2015-10-01 VITALS — BP 134/58 | HR 68 | Wt 273.0 lb

## 2015-10-01 DIAGNOSIS — Z1389 Encounter for screening for other disorder: Secondary | ICD-10-CM

## 2015-10-01 DIAGNOSIS — Z331 Pregnant state, incidental: Secondary | ICD-10-CM

## 2015-10-01 DIAGNOSIS — Z3492 Encounter for supervision of normal pregnancy, unspecified, second trimester: Secondary | ICD-10-CM

## 2015-10-01 LAB — POCT URINALYSIS DIPSTICK
Glucose, UA: NEGATIVE
KETONES UA: NEGATIVE
Leukocytes, UA: NEGATIVE
Nitrite, UA: NEGATIVE
PROTEIN UA: NEGATIVE
RBC UA: NEGATIVE

## 2015-10-01 NOTE — Progress Notes (Signed)
Low-risk OB appointment G2P0010 2238w3d Estimated Date of Delivery: 01/18/16 BP 134/58 mmHg  Pulse 68  Wt 273 lb (123.832 kg)  LMP 04/13/2015 (Exact Date)  BP, weight, and urine reviewed.  Refer to obstetrical flow sheet for FH & FHR.  Reports good fm.  Denies regular uc's, lof, vb, or uti s/s. 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 Plan:  Continue routine obstetrical care  F/U in 4wks for OB appointment and pn2

## 2015-10-01 NOTE — Patient Instructions (Addendum)
You will have your sugar test next visit.  Please do not eat or drink anything after midnight the night before you come, not even water.  You will be here for at least two hours.     Call the office ([phone]) or go to [facility] if:  You begin to have strong, frequent contractions  Your water breaks.  Sometimes it is a big gush of fluid, sometimes it is just a trickle that keeps getting your panties wet or running down your legs  You have vaginal bleeding.  It is normal to have a small amount of spotting if your cervix was checked.   You don't feel your baby moving like normal.  If you don't, get you something to eat and drink and lay down and focus on feeling your baby move.   If your baby is still not moving like normal, you should call the office or go to [facility].  [place] Pediatricians/Family Doctors:  [facility] [phone]            [facility] [phone]                 [facility] [phone] (usually not accepting new patients unless you have family there already, you are always welcome to call and ask)            [facility] ([address] [place]) [phone]   [place] Pediatricians/Family Doctors:   [facility]: [phone]  [facility]: [phone]    Second Trimester of Pregnancy The second trimester is from week 13 through week 28, months 4 through 6. The second trimester is often a time when you feel your best. Your body has also adjusted to being pregnant, and you begin to feel better physically. Usually, morning sickness has lessened or quit completely, you may have more energy, and you may have an increase in appetite. The second trimester is also a time when the fetus is growing rapidly. At the end of the sixth month, the fetus is about 9 inches long and weighs about 1 pounds. You will likely begin to feel the baby move (quickening) between 18 and 20  weeks of the pregnancy. BODY CHANGES Your body goes through many changes during pregnancy. The changes vary from woman to woman.  6. Your weight will continue to increase. You will notice your lower abdomen bulging out. 7. You may begin to get stretch marks on your hips, abdomen, and breasts. 8. You may develop headaches that can be relieved by medicines approved by your health care provider. 9. You may urinate more often because the fetus is pressing on your bladder. 10. You may develop or continue to have heartburn as a result of your pregnancy. 11. You may develop constipation because certain hormones are causing the muscles that push waste through your intestines to slow down. 12. You may develop hemorrhoids or swollen, bulging veins (varicose veins). 13. You may have back pain because of the weight gain and pregnancy hormones relaxing your joints between the bones in your pelvis and as a result of a shift in weight and the muscles that support your balance. 14. Your breasts will continue to grow and be tender. 15. Your gums may bleed and may be sensitive to brushing and flossing. 16. Dark spots or blotches (chloasma, mask of pregnancy) may develop on your face. This will likely fade after the baby is born. 17. A dark line from your belly button to the pubic   area (linea nigra) may appear. This will likely fade after the baby is born. 18. You may have changes in your hair. These can include thickening of your hair, rapid growth, and changes in texture. Some women also have hair loss during or after pregnancy, or hair that feels dry or thin. Your hair will most likely return to normal after your baby is born. WHAT TO EXPECT AT YOUR PRENATAL VISITS During a routine prenatal visit: 2. You will be weighed to make sure you and the fetus are growing normally. 3. Your blood pressure will be taken. 4. Your abdomen will be measured to track your baby's growth. 5. The fetal heartbeat will be listened  to. 6. Any test results from the previous visit will be discussed. Your health care provider may ask you: 2. How you are feeling. 3. If you are feeling the baby move. 4. If you have had any abnormal symptoms, such as leaking fluid, bleeding, severe headaches, or abdominal cramping. 5. If you have any questions. Other tests that may be performed during your second trimester include: 2. Blood tests that check for: 1. Low iron levels (anemia). 2. Gestational diabetes (between 24 and 28 weeks). 3. Rh antibodies. 3. Urine tests to check for infections, diabetes, or protein in the urine. 4. An ultrasound to confirm the proper growth and development of the baby. 5. An amniocentesis to check for possible genetic problems. 6. Fetal screens for spina bifida and Down syndrome. HOME CARE INSTRUCTIONS  3. Avoid all smoking, herbs, alcohol, and unprescribed drugs. These chemicals affect the formation and growth of the baby. 4. Follow your health care provider's instructions regarding medicine use. There are medicines that are either safe or unsafe to take during pregnancy. 5. Exercise only as directed by your health care provider. Experiencing uterine cramps is a good sign to stop exercising. 6. Continue to eat regular, healthy meals. 7. Wear a good support bra for breast tenderness. 8. Do not use hot tubs, steam rooms, or saunas. 9. Wear your seat belt at all times when driving. 10. Avoid raw meat, uncooked cheese, cat litter boxes, and soil used by cats. These carry germs that can cause birth defects in the baby. 11. Take your prenatal vitamins. 12. Try taking a stool softener (if your health care provider approves) if you develop constipation. Eat more high-fiber foods, such as fresh vegetables or fruit and whole grains. Drink plenty of fluids to keep your urine clear or pale yellow. 13. Take warm sitz baths to soothe any pain or discomfort caused by hemorrhoids. Use hemorrhoid cream if your health  care provider approves. 14. If you develop varicose veins, wear support hose. Elevate your feet for 15 minutes, 3-4 times a day. Limit salt in your diet. 15. Avoid heavy lifting, wear low heel shoes, and practice good posture. 16. Rest with your legs elevated if you have leg cramps or low back pain. 17. Visit your dentist if you have not gone yet during your pregnancy. Use a soft toothbrush to brush your teeth and be gentle when you floss. 18. A sexual relationship may be continued unless your health care provider directs you otherwise. 19. Continue to go to all your prenatal visits as directed by your health care provider. SEEK MEDICAL CARE IF:   You have dizziness.  You have mild pelvic cramps, pelvic pressure, or nagging pain in the abdominal area.  You have persistent nausea, vomiting, or diarrhea.  You have a bad smelling vaginal discharge.  You have   pain with urination. SEEK IMMEDIATE MEDICAL CARE IF:   You have a fever.  You are leaking fluid from your vagina.  You have spotting or bleeding from your vagina.  You have severe abdominal cramping or pain.  You have rapid weight gain or loss.  You have shortness of breath with chest pain.  You notice sudden or extreme swelling of your face, hands, ankles, feet, or legs.  You have not felt your baby move in over an hour.  You have severe headaches that do not go away with medicine.  You have vision changes. Document Released: 11/02/2001 Document Revised: 11/13/2013 Document Reviewed: 01/09/2013 ExitCare Patient Information 2015 [employer]. This information is not intended to replace advice given to you by your health care provider. Make sure you discuss any questions you have with your health care provider.     

## 2015-10-06 ENCOUNTER — Telehealth: Payer: Self-pay | Admitting: *Deleted

## 2015-10-06 NOTE — Telephone Encounter (Signed)
Pt informed I have the ADA forms and will try to complete today.

## 2015-10-14 DIAGNOSIS — Z029 Encounter for administrative examinations, unspecified: Secondary | ICD-10-CM

## 2015-10-27 ENCOUNTER — Telehealth: Payer: Self-pay | Admitting: *Deleted

## 2015-10-27 NOTE — Telephone Encounter (Signed)
Pt informed have received FMLA forms will complete ASAP. Pt verbalized understanding.

## 2015-10-29 ENCOUNTER — Other Ambulatory Visit: Payer: 59

## 2015-10-29 ENCOUNTER — Encounter: Payer: Self-pay | Admitting: Obstetrics and Gynecology

## 2015-10-29 ENCOUNTER — Ambulatory Visit (INDEPENDENT_AMBULATORY_CARE_PROVIDER_SITE_OTHER): Payer: 59 | Admitting: Obstetrics and Gynecology

## 2015-10-29 VITALS — BP 120/82 | HR 90 | Wt 276.0 lb

## 2015-10-29 DIAGNOSIS — Z3492 Encounter for supervision of normal pregnancy, unspecified, second trimester: Secondary | ICD-10-CM

## 2015-10-29 DIAGNOSIS — Z369 Encounter for antenatal screening, unspecified: Secondary | ICD-10-CM

## 2015-10-29 DIAGNOSIS — Z331 Pregnant state, incidental: Secondary | ICD-10-CM

## 2015-10-29 DIAGNOSIS — Z1389 Encounter for screening for other disorder: Secondary | ICD-10-CM

## 2015-10-29 DIAGNOSIS — Z131 Encounter for screening for diabetes mellitus: Secondary | ICD-10-CM

## 2015-10-29 DIAGNOSIS — Z3402 Encounter for supervision of normal first pregnancy, second trimester: Secondary | ICD-10-CM

## 2015-10-29 DIAGNOSIS — Z3A28 28 weeks gestation of pregnancy: Secondary | ICD-10-CM

## 2015-10-29 LAB — POCT URINALYSIS DIPSTICK
GLUCOSE UA: NEGATIVE
Ketones, UA: NEGATIVE
Leukocytes, UA: NEGATIVE
NITRITE UA: NEGATIVE
PROTEIN UA: NEGATIVE
RBC UA: NEGATIVE

## 2015-10-29 NOTE — Assessment & Plan Note (Signed)
Classes planned.

## 2015-10-29 NOTE — Progress Notes (Signed)
Pt denies any problems or concerns at this time.  

## 2015-10-29 NOTE — Progress Notes (Signed)
Patient ID: Patricia [name], female   DOB: 1991/05/01, 24 y.o.   MRN: [id number]  G2P0010 7683w3d Estimated Date of Delivery: 01/18/16  Blood pressure 120/82, pulse 90, weight 276 lb (125.193 kg), last menstrual period 04/13/2015.   refer to the ob flow sheet for FH and FHR, also BP, Wt, Urine results:notable for negative  Patient reports + good fetal movement, denies any bleeding and no rupture of membranes symptoms or regular contractions. Patient complaints: None.   FHR: 138 bpm FH: 35 cm, u+16    Questions were answered. Assessment: LROB G2P0010 @ 3483w3d,    GTT Testing Today                          Size>dates,  Felt due to body habitus,if persists will u/s  Plan:  Continued routine obstetrical care,   F/u in 4 weeks for LROB pnx care    By signing my name below, I, Ronney LionSuzanne Le, attest that this documentation has been prepared under the direction and in the presence of Tilda BurrowJohn V Alashia Brownfield, MD. Electronically Signed: Ronney LionSuzanne Le, ED Scribe. 10/29/2015. 10:10 AM.   I personally performed the services described in this documentation, which was SCRIBED in my presence. The recorded information has been reviewed and considered accurate. It has been edited as necessary during review. Tilda BurrowFERGUSON,Dyani Babel V, MD

## 2015-10-30 ENCOUNTER — Telehealth: Payer: Self-pay | Admitting: *Deleted

## 2015-10-30 LAB — HIV ANTIBODY (ROUTINE TESTING W REFLEX): HIV SCREEN 4TH GENERATION: NONREACTIVE

## 2015-10-30 LAB — RPR: RPR Ser Ql: NONREACTIVE

## 2015-10-30 LAB — GLUCOSE TOLERANCE, 2 HOURS W/ 1HR
GLUCOSE, 2 HOUR: 101 mg/dL (ref 65–152)
Glucose, 1 hour: 94 mg/dL (ref 65–179)
Glucose, Fasting: 78 mg/dL (ref 65–91)

## 2015-10-30 LAB — CBC
HEMATOCRIT: 33.6 % — AB (ref 34.0–46.6)
HEMOGLOBIN: 11.5 g/dL (ref 11.1–15.9)
MCH: 26.6 pg (ref 26.6–33.0)
MCHC: 34.2 g/dL (ref 31.5–35.7)
MCV: 78 fL — ABNORMAL LOW (ref 79–97)
Platelets: 307 10*3/uL (ref 150–379)
RBC: 4.32 x10E6/uL (ref 3.77–5.28)
RDW: 13.2 % (ref 12.3–15.4)
WBC: 10.8 10*3/uL (ref 3.4–10.8)

## 2015-10-30 LAB — ANTIBODY SCREEN: ANTIBODY SCREEN: NEGATIVE

## 2015-10-30 NOTE — Telephone Encounter (Signed)
Pt aware of results from yesterday's sugar test.

## 2015-11-23 NOTE — L&D Delivery Note (Signed)
Delivery Note Pt pushed off and on a total of about 2 hours. At 3:33 AM a viable female was delivered via Vaginal, Spontaneous Delivery (Presentation: ROA  ). The shoulders were not forthcoming, so the posterior (right) axilla was grasped with my index finger, and the baby was rotated clockwise into the oblique diameter.  At this point, the (now) anterior shoulder was released, baby handed back over to Dr. Audrea Muscat and the baby delivered.  At no time was any traction placed on the baby's head.   APGAR: 8/9 ; weight  Pending.  After 3 minutes, the cord was clamped and cut. 40 units of pitocin diluted in 1000cc LR was infused rapidly IV.  The placenta separated spontaneously and delivered via CCT and maternal pushing effort.  It was inspected and appears to be intact with a 3 VC.  Marland Kitchen    Anesthesia: Epidural  Episiotomy:  none Lacerations:  none Suture Repair: n/a Est. Blood Loss (mL)  100:    Mom to postpartum.  Baby to Couplet care / Skin to Skin.  The above by Dr. Audrea Muscat under my direct supervision.  CRESENZO-DISHMAN,Gianlucas Evenson 01/02/2016, 3:39 AM

## 2015-11-26 ENCOUNTER — Encounter: Payer: Self-pay | Admitting: Women's Health

## 2015-11-26 ENCOUNTER — Ambulatory Visit (INDEPENDENT_AMBULATORY_CARE_PROVIDER_SITE_OTHER): Payer: 59 | Admitting: Women's Health

## 2015-11-26 ENCOUNTER — Encounter: Payer: 59 | Admitting: Women's Health

## 2015-11-26 VITALS — BP 138/60 | HR 76 | Wt 282.0 lb

## 2015-11-26 DIAGNOSIS — Z331 Pregnant state, incidental: Secondary | ICD-10-CM

## 2015-11-26 DIAGNOSIS — O26843 Uterine size-date discrepancy, third trimester: Secondary | ICD-10-CM

## 2015-11-26 DIAGNOSIS — Z1389 Encounter for screening for other disorder: Secondary | ICD-10-CM

## 2015-11-26 DIAGNOSIS — Z3493 Encounter for supervision of normal pregnancy, unspecified, third trimester: Secondary | ICD-10-CM

## 2015-11-26 LAB — POCT URINALYSIS DIPSTICK
GLUCOSE UA: NEGATIVE
KETONES UA: NEGATIVE
LEUKOCYTES UA: NEGATIVE
NITRITE UA: NEGATIVE
Protein, UA: NEGATIVE
RBC UA: NEGATIVE

## 2015-11-26 NOTE — Progress Notes (Signed)
Low-risk OB appointment G2P0010 7882w3d Estimated Date of Delivery: 01/18/16 BP 138/60 mmHg  Pulse 76  Wt 282 lb (127.914 kg)  LMP 04/13/2015 (Exact Date)  BP, weight, and urine reviewed.  Refer to obstetrical flow sheet for FH & FHR.  Reports good fm.  Denies regular uc's, lof, vb, or uti s/s. No complaints. Interested in waterbirth- has signed up for class in January- discussed and gave printed info. Has gained 6lb in last 4wks, 22lb overall of recommended 20lb max- to decrease carbs/increase activity.  Reviewed ptl s/s, fkc. Plan:  Continue routine obstetrical care  F/U in asap for efw/afi u/s for s>d (no visit), then 2wks for OB appointment

## 2015-11-26 NOTE — Patient Instructions (Signed)
[place] Pediatricians/Family Doctors:  [facility] [phone]                 [place] [phone] (usually not accepting new patients unless you have family there already, you are always welcome to call and ask)            [facility] ([place]) [phone]   [facility] [place] Pediatricians/Family Doctors:   [place]: [phone]  [facility]: [phone]    Call the office [phone]) or go to [facility] if:  You begin to have strong, frequent contractions  Your water breaks.  Sometimes it is a big gush of fluid, sometimes it is just a trickle that keeps getting your panties wet or running down your legs  You have vaginal bleeding.  It is normal to have a small amount of spotting if your cervix was checked.   You don't feel your baby moving like normal.  If you don't, get you something to eat and drink and lay down and focus on feeling your baby move.  You should feel at least 10 movements in 2 hours.  If you don't, you should call the office or go to [place] Preterm labor is when labor starts at less than 37 weeks of pregnancy. The normal length of a pregnancy is 39 to 41 weeks. CAUSES Often, there is no identifiable underlying cause as to why a woman goes into preterm labor. One of the most common known causes of preterm labor is infection. Infections of the uterus, cervix, vagina, amniotic sac, bladder, kidney, or even the lungs (pneumonia) can cause labor to start. Other suspected causes of preterm labor include:   Urogenital infections, such as yeast infections and bacterial vaginosis.   Uterine abnormalities (uterine shape, uterine septum, fibroids, or bleeding from the placenta).   A cervix that has been operated on (it may fail to stay closed).   Malformations in the fetus.   Multiple  gestations (twins, triplets, and so on).   Breakage of the amniotic sac.  RISK FACTORS  Having a previous history of preterm labor.   Having premature rupture of membranes (PROM).   Having a placenta that covers the opening of the cervix (placenta previa).   Having a placenta that separates from the uterus (placental abruption).   Having a cervix that is too weak to hold the fetus in the uterus (incompetent cervix).   Having too much fluid in the amniotic sac (polyhydramnios).   Taking illegal drugs or smoking while pregnant.   Not gaining enough weight while pregnant.   Being younger than 69 and older than 26 years old.   Having a low socioeconomic status.   Being African American. SYMPTOMS Signs and symptoms of preterm labor include:   Menstrual-like cramps, abdominal pain, or back pain.  Uterine contractions that are regular, as frequent as six in an hour, regardless of their intensity (may be mild or painful).  Contractions that start on the top of the uterus and spread down to the lower abdomen and back.   A sense of increased pelvic pressure.   A watery or bloody mucus discharge that comes from the vagina.  TREATMENT Depending on the length of the pregnancy and other circumstances, your health care provider may suggest bed rest. If necessary, there are medicines that can be given to stop contractions and  to mature the fetal lungs. If labor happens before 34 weeks of pregnancy, a prolonged hospital stay may be recommended. Treatment depends on the condition of both you and the fetus.  WHAT SHOULD YOU DO IF YOU THINK YOU ARE IN PRETERM LABOR? Call your health care provider right away. You will need to go to the hospital to get checked immediately. HOW CAN YOU PREVENT PRETERM LABOR IN FUTURE PREGNANCIES? You should:   Stop smoking if you smoke.  Maintain healthy weight gain and avoid chemicals and drugs that are not necessary.  Be watchful for any  type of infection.  Inform your health care provider if you have a known history of preterm labor.   This information is not intended to replace advice given to you by your health care provider. Make sure you discuss any questions you have with your health care provider.   Document Released: 01/29/2004 Document Revised: 07/11/2013 Document Reviewed: 12/11/2012 Elsevier Interactive Patient Education 2016 [employer]???  Why consider waterbirth? . Gentle birth for babies . Less pain medicine used in labor . May allow for passive descent/less pushing . May reduce perineal tears  . More mobility and instinctive maternal position changes . Increased maternal relaxation . Reduced blood pressure in labor  Is waterbirth safe? What are the risks of infection, drowning or other complications? . Infection o Very low risk (3.7 % for tub vs 4.8% for bed) o 7 in 8000 waterbirths with documented infection o Poorly cleaned equipment most common cause o Slightly lower group B strep transmission rate  . Drowning o Maternal:  - Very low risk   - Related to seizures or fainting o Newborn:  - Very low risk. No evidence of increased risk of respiratory problems in multiple large studies - Physiological protection from breathing under water - Avoid underwater birth if there are any fetal complications - Once baby's head is out of the water, keep it out.  . Birth complication o Some reports of cord trauma, but risk decreased by bringing baby to surface gradually o No evidence of increased risk of shoulder dystocia. Mothers can usually change positions faster in water than in a bed, possibly aiding the maneuvers to free the shoulder.  You must attend a Doren Custard class at [facility]  3rd Wednesday of every month from 7-9pm  Free  [employer] by calling [phone] or online at [email or web address].[email or web address]  Bring [place] the certificate from the class  Waterbirth  supplies needed for [facility] patients:  Our practice has a [employer] manager in a Box tub at the hospital that you can borrow  You will need to purchase an accessory kit that has all needed supplies through [employer] (  ) or online  Or you can purchase the supplies separately: o Single-use disposable tub liner for [employer] in a Box (REGULAR size) o New garden hose labeled "lead-free", "suitable for drinking water", "non-toxic" OR "water potable" o Garden hose to remove the dirty water o Electric drain pump to remove water (We recommend 792 gallon per hour or greater pump.)  o Fish net o Bathing suit top (optional) o Long-handled mirror (optional)  [employer] sells tubs for ~ $120 if you would rather purchase your own tub  [employer] (www.thelaborladies.com) $275 for tub rental/set-up & take down/kit   Things that would prevent you from having a waterbirth:  Premature, <37wks  Previous cesarean birth  Presence of thick meconium-stained fluid  Multiple gestation (Twins, triplets, etc.)  Uncontrolled diabetes  Hypertension  Heavy vaginal bleeding  Non-reassuring fetal heart rate  Active infection (MRSA, etc.)  If your labor has to be induced  Other risk issues identified by your obstetrical provider

## 2015-12-01 ENCOUNTER — Ambulatory Visit (INDEPENDENT_AMBULATORY_CARE_PROVIDER_SITE_OTHER): Payer: 59

## 2015-12-01 DIAGNOSIS — O26843 Uterine size-date discrepancy, third trimester: Secondary | ICD-10-CM | POA: Diagnosis not present

## 2015-12-01 DIAGNOSIS — F1290 Cannabis use, unspecified, uncomplicated: Secondary | ICD-10-CM

## 2015-12-01 DIAGNOSIS — Z3493 Encounter for supervision of normal pregnancy, unspecified, third trimester: Secondary | ICD-10-CM

## 2015-12-01 NOTE — Progress Notes (Signed)
US 33+1wks,cephalic,normal ov's bilat,ant pl gr 2,afi 11.7cm,fhr 138 bpm,efw 2270 56%

## 2015-12-10 ENCOUNTER — Encounter: Payer: 59 | Admitting: Women's Health

## 2015-12-10 ENCOUNTER — Encounter: Payer: Self-pay | Admitting: Women's Health

## 2015-12-10 ENCOUNTER — Ambulatory Visit (INDEPENDENT_AMBULATORY_CARE_PROVIDER_SITE_OTHER): Payer: 59 | Admitting: Women's Health

## 2015-12-10 VITALS — BP 132/78 | HR 68 | Wt 289.0 lb

## 2015-12-10 DIAGNOSIS — Z331 Pregnant state, incidental: Secondary | ICD-10-CM

## 2015-12-10 DIAGNOSIS — Z3493 Encounter for supervision of normal pregnancy, unspecified, third trimester: Secondary | ICD-10-CM

## 2015-12-10 DIAGNOSIS — Z1389 Encounter for screening for other disorder: Secondary | ICD-10-CM

## 2015-12-10 LAB — POCT URINALYSIS DIPSTICK
Blood, UA: NEGATIVE
GLUCOSE UA: NEGATIVE
Ketones, UA: NEGATIVE
Leukocytes, UA: NEGATIVE
NITRITE UA: NEGATIVE
PROTEIN UA: NEGATIVE

## 2015-12-10 MED ORDER — TRIAMCINOLONE ACETONIDE 0.025 % EX LOTN: TOPICAL_LOTION | CUTANEOUS | Status: DC

## 2015-12-10 NOTE — Patient Instructions (Signed)
Call the office ([phone]) or go to [facility] if:  You begin to have strong, frequent contractions  Your water breaks.  Sometimes it is a big gush of fluid, sometimes it is just a trickle that keeps getting your panties wet or running down your legs  You have vaginal bleeding.  It is normal to have a small amount of spotting if your cervix was checked.   You don't feel your baby moving like normal.  If you don't, get you something to eat and drink and lay down and focus on feeling your baby move.  You should feel at least 10 movements in 2 hours.  If you don't, you should call the office or go to [facility].    Preterm Labor Information Preterm labor is when labor starts at less than 37 weeks of pregnancy. The normal length of a pregnancy is 39 to 41 weeks. CAUSES Often, there is no identifiable underlying cause as to why a woman goes into preterm labor. One of the most common known causes of preterm labor is infection. Infections of the uterus, cervix, vagina, amniotic sac, bladder, kidney, or even the lungs (pneumonia) can cause labor to start. Other suspected causes of preterm labor include:   Urogenital infections, such as yeast infections and bacterial vaginosis.   Uterine abnormalities (uterine shape, uterine septum, fibroids, or bleeding from the placenta).   A cervix that has been operated on (it may fail to stay closed).   Malformations in the fetus.   Multiple gestations (twins, triplets, and so on).   Breakage of the amniotic sac.  RISK FACTORS  Having a previous history of preterm labor.   Having premature rupture of membranes (PROM).   Having a placenta that covers the opening of the cervix (placenta previa).   Having a placenta that separates from the uterus (placental abruption).   Having a cervix that is too weak to hold the fetus in the uterus (incompetent cervix).   Having too much fluid in the amniotic sac (polyhydramnios).   Taking  illegal drugs or smoking while pregnant.   Not gaining enough weight while pregnant.   Being younger than 18 and older than 26 years old.   Having a low socioeconomic status.   Being African American. SYMPTOMS Signs and symptoms of preterm labor include:   Menstrual-like cramps, abdominal pain, or back pain.  Uterine contractions that are regular, as frequent as six in an hour, regardless of their intensity (may be mild or painful).  Contractions that start on the top of the uterus and spread down to the lower abdomen and back.   A sense of increased pelvic pressure.   A watery or bloody mucus discharge that comes from the vagina.  TREATMENT Depending on the length of the pregnancy and other circumstances, your health care provider may suggest bed rest. If necessary, there are medicines that can be given to stop contractions and to mature the fetal lungs. If labor happens before 34 weeks of pregnancy, a prolonged hospital stay may be recommended. Treatment depends on the condition of both you and the fetus.  WHAT SHOULD YOU DO IF YOU THINK YOU ARE IN PRETERM LABOR? Call your health care provider right away. You will need to go to the hospital to get checked immediately. HOW CAN YOU PREVENT PRETERM LABOR IN FUTURE PREGNANCIES? You should:   Stop smoking if you smoke.  Maintain healthy weight gain and avoid chemicals and drugs that are not necessary.  Be watchful for   any type of infection.  Inform your health care provider if you have a known history of preterm labor.   This information is not intended to replace advice given to you by your health care provider. Make sure you discuss any questions you have with your health care provider.   Document Released: 01/29/2004 Document Revised: 07/11/2013 Document Reviewed: 12/11/2012 Elsevier Interactive Patient Education 2016 Elsevier Inc.  

## 2015-12-10 NOTE — Progress Notes (Signed)
Low-risk OB appointment G2P0010 [redacted]w[redacted]d Estimated Date of Delivery: 01/18/16 BP 132/78 mmHg  Pulse 68  Wt 289 lb (131.09 kg)  LMP 04/13/2015 (Exact Date)  BP, weight, and urine reviewed.  Refer to obstetrical flow sheet for FH & FHR.  Reports good fm.  Denies regular uc's, lof, vb, or uti s/s. No complaints. Goes to waterbirth class tonight. Wants rx for cream for eczema- hydrocortisone cream not helping. Rx triamcinolone 0.025% lotion use small amt bid prn.  Reviewed ptl s/s, fkc. Plan:  Continue routine obstetrical care  F/U in 2wks for OB appointment

## 2015-12-12 ENCOUNTER — Telehealth: Payer: Self-pay | Admitting: *Deleted

## 2015-12-17 NOTE — Telephone Encounter (Signed)
Returned pt's call, answered questions about waterbirth kit, etc.  Roma Schanz, CNM, [facility] 12/17/2015 3:22 PM

## 2015-12-24 ENCOUNTER — Ambulatory Visit (INDEPENDENT_AMBULATORY_CARE_PROVIDER_SITE_OTHER): Payer: 59 | Admitting: Obstetrics & Gynecology

## 2015-12-24 ENCOUNTER — Encounter: Payer: Self-pay | Admitting: Obstetrics & Gynecology

## 2015-12-24 VITALS — BP 128/80 | HR 74 | Wt 290.0 lb

## 2015-12-24 DIAGNOSIS — Z3493 Encounter for supervision of normal pregnancy, unspecified, third trimester: Secondary | ICD-10-CM

## 2015-12-24 DIAGNOSIS — Z3483 Encounter for supervision of other normal pregnancy, third trimester: Secondary | ICD-10-CM

## 2015-12-24 DIAGNOSIS — Z1389 Encounter for screening for other disorder: Secondary | ICD-10-CM

## 2015-12-24 DIAGNOSIS — Z331 Pregnant state, incidental: Secondary | ICD-10-CM

## 2015-12-24 DIAGNOSIS — Z3A37 37 weeks gestation of pregnancy: Secondary | ICD-10-CM

## 2015-12-24 LAB — POCT URINALYSIS DIPSTICK
Blood, UA: NEGATIVE
GLUCOSE UA: NEGATIVE
Ketones, UA: NEGATIVE
LEUKOCYTES UA: NEGATIVE
NITRITE UA: NEGATIVE
PROTEIN UA: NEGATIVE

## 2015-12-24 MED ORDER — ONDANSETRON 8 MG PO TBDP: 8.0000 mg | ORAL_TABLET | Freq: Three times a day (TID) | ORAL | Status: DC | PRN

## 2015-12-24 MED ORDER — OMEPRAZOLE 20 MG PO CPDR: 20.0000 mg | DELAYED_RELEASE_CAPSULE | Freq: Every day | ORAL | Status: DC

## 2015-12-24 NOTE — Progress Notes (Signed)
G2P0010 [redacted]w[redacted]d Estimated Date of Delivery: 01/18/16  Blood pressure 128/80, pulse 74, weight 290 lb (131.543 kg), last menstrual period 04/13/2015.   BP weight and urine results all reviewed and noted.  Please refer to the obstetrical flow sheet for the fundal height and fetal heart rate documentation:  Patient reports good fetal movement, denies any bleeding and no rupture of membranes symptoms or regular contractions. Patient is without complaints. All questions were answered.  Orders Placed This Encounter  Procedures  . POCT urinalysis dipstick    Plan:  Continued routine obstetrical care, vertex by quick sonogram  No Follow-up on file.

## 2015-12-30 ENCOUNTER — Encounter: Payer: Self-pay | Admitting: Obstetrics and Gynecology

## 2015-12-30 ENCOUNTER — Ambulatory Visit (INDEPENDENT_AMBULATORY_CARE_PROVIDER_SITE_OTHER): Payer: 59 | Admitting: Obstetrics and Gynecology

## 2015-12-30 VITALS — BP 152/92 | HR 90 | Wt 293.5 lb

## 2015-12-30 DIAGNOSIS — Z3493 Encounter for supervision of normal pregnancy, unspecified, third trimester: Secondary | ICD-10-CM

## 2015-12-30 DIAGNOSIS — Z331 Pregnant state, incidental: Secondary | ICD-10-CM

## 2015-12-30 DIAGNOSIS — O133 Gestational [pregnancy-induced] hypertension without significant proteinuria, third trimester: Secondary | ICD-10-CM

## 2015-12-30 DIAGNOSIS — Z1389 Encounter for screening for other disorder: Secondary | ICD-10-CM

## 2015-12-30 DIAGNOSIS — Z369 Encounter for antenatal screening, unspecified: Secondary | ICD-10-CM

## 2015-12-30 LAB — POCT URINALYSIS DIPSTICK
Glucose, UA: NEGATIVE
Ketones, UA: NEGATIVE
Leukocytes, UA: NEGATIVE
NITRITE UA: NEGATIVE
PROTEIN UA: NEGATIVE
RBC UA: NEGATIVE

## 2015-12-30 NOTE — Progress Notes (Signed)
Pt denies any problems or concerns at this time.  

## 2015-12-30 NOTE — Progress Notes (Signed)
G2P0010 [redacted]w[redacted]d Estimated Date of Delivery: 01/18/16  Blood pressure 150/100, pulse 90, weight 293 lb 8 oz (133.131 kg), last menstrual period 04/13/2015.  33 lb wt gain.  refer to the ob flow sheet for FH and FHR, also BP, Wt, Urine results:notable for neg protein, glucose.  Patient reports  Reflexes 1 + ,  good fetal movement, denies any bleeding and no rupture of membranes symptoms or regular contractions. Patient complaints:no headache scotoma, vision changes..  Questions were answered. Assessment: LROB G2P0010 @ [redacted]w[redacted]d gest Htn will check PIH labs and reassess in a.m if still high, or PRe-E labs high, would be candidate for IOL.  Plan:  Continued routine obstetrical care, check Pr/Cr ratio,CMP, CBC.   F/u in 1 day  weeks for  BP recheck. And labs.

## 2015-12-31 ENCOUNTER — Encounter (HOSPITAL_COMMUNITY): Payer: Self-pay | Admitting: Anesthesiology

## 2015-12-31 ENCOUNTER — Inpatient Hospital Stay (HOSPITAL_COMMUNITY)
Admission: AD | Admit: 2015-12-31 | Discharge: 2016-01-04 | DRG: 775 | Disposition: A | Discharge status: Home / Self Care | Payer: 59 | Source: Ambulatory Visit | Attending: Obstetrics & Gynecology | Admitting: Obstetrics & Gynecology

## 2015-12-31 ENCOUNTER — Ambulatory Visit (INDEPENDENT_AMBULATORY_CARE_PROVIDER_SITE_OTHER): Payer: 59 | Admitting: Obstetrics and Gynecology

## 2015-12-31 ENCOUNTER — Encounter (HOSPITAL_COMMUNITY): Payer: Self-pay | Admitting: *Deleted

## 2015-12-31 ENCOUNTER — Encounter: Payer: Self-pay | Admitting: Obstetrics and Gynecology

## 2015-12-31 VITALS — BP 150/90 | HR 72 | Wt 290.6 lb

## 2015-12-31 DIAGNOSIS — Z3A37 37 weeks gestation of pregnancy: Secondary | ICD-10-CM | POA: Diagnosis not present

## 2015-12-31 DIAGNOSIS — F1290 Cannabis use, unspecified, uncomplicated: Secondary | ICD-10-CM

## 2015-12-31 DIAGNOSIS — Z823 Family history of stroke: Secondary | ICD-10-CM

## 2015-12-31 DIAGNOSIS — Z3483 Encounter for supervision of other normal pregnancy, third trimester: Secondary | ICD-10-CM | POA: Diagnosis not present

## 2015-12-31 DIAGNOSIS — O99324 Drug use complicating childbirth: Secondary | ICD-10-CM | POA: Diagnosis present

## 2015-12-31 DIAGNOSIS — Z3493 Encounter for supervision of normal pregnancy, unspecified, third trimester: Secondary | ICD-10-CM

## 2015-12-31 DIAGNOSIS — Z8249 Family history of ischemic heart disease and other diseases of the circulatory system: Secondary | ICD-10-CM

## 2015-12-31 DIAGNOSIS — Z3A38 38 weeks gestation of pregnancy: Secondary | ICD-10-CM

## 2015-12-31 DIAGNOSIS — O134 Gestational [pregnancy-induced] hypertension without significant proteinuria, complicating childbirth: Principal | ICD-10-CM | POA: Diagnosis present

## 2015-12-31 DIAGNOSIS — O133 Gestational [pregnancy-induced] hypertension without significant proteinuria, third trimester: Secondary | ICD-10-CM

## 2015-12-31 DIAGNOSIS — Z331 Pregnant state, incidental: Secondary | ICD-10-CM

## 2015-12-31 DIAGNOSIS — Z1389 Encounter for screening for other disorder: Secondary | ICD-10-CM

## 2015-12-31 LAB — TYPE AND SCREEN
ABO/RH(D): A POS
Antibody Screen: NEGATIVE

## 2015-12-31 LAB — CBC
HCT: 34.7 % — ABNORMAL LOW (ref 36.0–46.0)
Hemoglobin: 11.6 g/dL — ABNORMAL LOW (ref 12.0–15.0)
MCH: 26 pg (ref 26.0–34.0)
MCHC: 33.4 g/dL (ref 30.0–36.0)
MCV: 77.8 fL — AB (ref 78.0–100.0)
PLATELETS: 265 10*3/uL (ref 150–400)
RBC: 4.46 MIL/uL (ref 3.87–5.11)
RDW: 14.7 % (ref 11.5–15.5)
WBC: 10.3 10*3/uL (ref 4.0–10.5)

## 2015-12-31 LAB — COMPREHENSIVE METABOLIC PANEL
ALK PHOS: 90 U/L (ref 38–126)
ALT: 13 U/L — AB (ref 14–54)
AST: 20 U/L (ref 15–41)
Albumin: 3 g/dL — ABNORMAL LOW (ref 3.5–5.0)
Anion gap: 9 (ref 5–15)
BUN: 8 mg/dL (ref 6–20)
CALCIUM: 9.1 mg/dL (ref 8.9–10.3)
CO2: 21 mmol/L — AB (ref 22–32)
CREATININE: 0.53 mg/dL (ref 0.44–1.00)
Chloride: 106 mmol/L (ref 101–111)
GFR calc non Af Amer: >60 mL/min (ref >60)
Glucose, Bld: 85 mg/dL (ref 65–99)
Potassium: 4.2 mmol/L (ref 3.5–5.1)
SODIUM: 136 mmol/L (ref 135–145)
Total Bilirubin: 0.2 mg/dL — ABNORMAL LOW (ref 0.3–1.2)
Total Protein: 6.4 g/dL — ABNORMAL LOW (ref 6.5–8.1)

## 2015-12-31 LAB — POCT URINALYSIS DIPSTICK
Blood, UA: NEGATIVE
GLUCOSE UA: NEGATIVE
KETONES UA: NEGATIVE
Leukocytes, UA: NEGATIVE
NITRITE UA: NEGATIVE

## 2015-12-31 LAB — PROTEIN / CREATININE RATIO, URINE
Creatinine, Urine: 171 mg/dL
Protein Creatinine Ratio: 0.13 mg/mg{Cre} (ref 0.00–0.15)
TOTAL PROTEIN, URINE: 23 mg/dL

## 2015-12-31 LAB — GROUP B STREP BY PCR: Group B strep by PCR: POSITIVE — AB

## 2015-12-31 LAB — ABO/RH: ABO/RH(D): A POS

## 2015-12-31 LAB — OB RESULTS CONSOLE GBS: GBS: POSITIVE

## 2015-12-31 MED ORDER — OXYTOCIN BOLUS FROM INFUSION
500.0000 mL | INTRAVENOUS | Status: DC
  Administered 2016-01-02: 500 mL via INTRAVENOUS

## 2015-12-31 MED ORDER — OXYCODONE-ACETAMINOPHEN 5-325 MG PO TABS: 2.0000 | ORAL_TABLET | ORAL | Status: DC | PRN

## 2015-12-31 MED ORDER — ONDANSETRON HCL 4 MG/2ML IJ SOLN
4.0000 mg | Freq: Four times a day (QID) | INTRAMUSCULAR | Status: DC | PRN
  Administered 2016-01-01 (×2): 4 mg via INTRAVENOUS
  Filled 2015-12-31 (×2): qty 2

## 2015-12-31 MED ORDER — CITRIC ACID-SODIUM CITRATE 334-500 MG/5ML PO SOLN: 30.0000 mL | ORAL | Status: DC | PRN

## 2015-12-31 MED ORDER — OXYTOCIN 10 UNIT/ML IJ SOLN: 2.5000 [IU]/h | INTRAVENOUS | Status: DC

## 2015-12-31 MED ORDER — LIDOCAINE HCL (PF) 1 % IJ SOLN
30.0000 mL | INTRAMUSCULAR | Status: DC | PRN
  Filled 2015-12-31: qty 30

## 2015-12-31 MED ORDER — LACTATED RINGERS IV SOLN
500.0000 mL | INTRAVENOUS | Status: DC | PRN
  Administered 2016-01-01: 1000 mL via INTRAVENOUS
  Administered 2016-01-01: 500 mL via INTRAVENOUS

## 2015-12-31 MED ORDER — ACETAMINOPHEN 325 MG PO TABS: 650.0000 mg | ORAL_TABLET | ORAL | Status: DC | PRN

## 2015-12-31 MED ORDER — PENICILLIN G POTASSIUM 5000000 UNITS IJ SOLR
5.0000 10*6.[IU] | Freq: Once | INTRAMUSCULAR | Status: AC
  Administered 2016-01-01: 5 10*6.[IU] via INTRAVENOUS
  Filled 2015-12-31: qty 5

## 2015-12-31 MED ORDER — MISOPROSTOL 200 MCG PO TABS
ORAL_TABLET | ORAL | Status: AC
  Filled 2015-12-31: qty 1

## 2015-12-31 MED ORDER — LACTATED RINGERS IV SOLN
INTRAVENOUS | Status: DC
  Administered 2015-12-31 – 2016-01-01 (×2): via INTRAVENOUS

## 2015-12-31 MED ORDER — MISOPROSTOL 25 MCG QUARTER TABLET
25.0000 ug | ORAL_TABLET | ORAL | Status: DC
  Administered 2015-12-31: 25 ug via VAGINAL
  Filled 2015-12-31: qty 0.25

## 2015-12-31 MED ORDER — PENICILLIN G POTASSIUM 5000000 UNITS IJ SOLR
2.5000 10*6.[IU] | INTRAVENOUS | Status: DC
  Administered 2016-01-01 – 2016-01-02 (×5): 2.5 10*6.[IU] via INTRAVENOUS
  Filled 2015-12-31 (×8): qty 2.5

## 2015-12-31 MED ORDER — OXYCODONE-ACETAMINOPHEN 5-325 MG PO TABS: 1.0000 | ORAL_TABLET | ORAL | Status: DC | PRN

## 2015-12-31 NOTE — Patient Instructions (Signed)
[facility]

## 2015-12-31 NOTE — H&P (Signed)
LABOR ADMISSION HISTORY AND PHYSICAL  Patricia [name] is a 25 y.o. female G2P0010 with IUP at [redacted]w[redacted]d by LMP and 7wk [place] presenting from clinic for IOL for gestational hypertension . She reports +FM, + contractions, No LOF, no VB, no blurry vision, headaches or peripheral edema, and RUQ pain.  She plans on breast feeding. She undecided on birth control; she has had significant cramping while on different methods of contraception.  Dating: By 7wk [place] --->  Estimated Date of Delivery: 01/18/16  Sono:    , CWD, normal anatomy, 385g @ [redacted]w[redacted]d, 2270g, 56% EFW  Of note, patient reports she is allergic to Keflex (hives), however she has taken amoxicillin (some point after trying Keflex) and did not have any issues   Prenatal History/Complications: Marijuana use Obesity  Newly diagnosed gestational hypertension   Past Medical History: Past Medical History  Diagnosis Date  . Polycystic disease, ovaries   . Migraine   . Migraines   . PCO (polycystic ovaries) 03/08/2014  . Pregnant 05/27/2015  . URI (upper respiratory infection) 08/01/2015  . Cough 08/01/2015  . Nausea and vomiting during pregnancy prior to [redacted] weeks gestation 08/01/2015    Past Surgical History: Past Surgical History  Procedure Laterality Date  . Tonsillectomy      Obstetrical History: OB History    Gravida Para Term Preterm AB TAB SAB Ectopic Multiple Living   Social History: Social History   Social History  . Marital Status: Single    Spouse Name: N/A  . Number of Children: N/A  . Years of Education: N/A   Social History Main Topics  . Smoking status: Never Smoker   . Smokeless tobacco: Never Used  . Alcohol Use: No     Comment: occassional  . Drug Use: No  . Sexual Activity: Yes    Birth Control/ Protection: None   Other Topics Concern  . Not on file   Social History Narrative    Family History: Family History  Problem Relation Age of Onset  . Migraines Sister   . Hypertension  Brother   . Heart disease Maternal Grandmother   . Cancer Maternal Grandfather     prostate  . Stroke Paternal Grandmother   . Cancer Paternal Grandfather     prostate  . Stroke Paternal Grandfather     Allergies: Allergies  Allergen Reactions  . Peanut-Containing Drug Products Anaphylaxis and Hives  . Propranolol Shortness Of Breath  . Cephalexin Hives    Keflex  . Imitrex [Sumatriptan] Hives    Prescriptions prior to admission  Medication Sig Dispense Refill Last Dose  . omeprazole (PRILOSEC) 20 MG capsule Take 1 capsule (20 mg total) by mouth daily. 1 tablet a day 30 capsule 6 Taking  . ondansetron (ZOFRAN-ODT) 8 MG disintegrating tablet Take 1 tablet (8 mg total) by mouth every 8 (eight) hours as needed for nausea or vomiting. 20 tablet 1 Taking  . Prenat-FeBis-FePro-FA-CA-Omega (COMPLETE NATAL DHA) 29-1-200 & 250 MG MISC Take 1 tablet by mouth daily. 30 each 11 Taking  . ranitidine (ZANTAC) 150 MG tablet Take 150 mg by mouth 2 (two) times daily. Reported on 12/31/2015   Not Taking  . Triamcinolone Acetonide 0.025 % LOTN Apply small amount twice daily as needed 60 mL 0 Taking     Review of Systems   All systems reviewed and negative except as stated in HPI  LMP 04/13/2015 (Exact Date) General  appearance: alert and cooperative Lungs: clear to auscultation bilaterally Heart: regular rate and rhythm Abdomen: soft, non-tender; bowel sounds normal Extremities: Homans sign is negative, no sign of DVT, edema  Fetal monitoringBaseline: 140 bpm, Variability: Good {> 6 bpm), Accelerations: Reactive and Decelerations: Absent Uterine activity: irritability    Prenatal labs: ABO, Rh: A/Positive/-- (07/20 1536) Antibody: Negative (12/07 0931) Rubella: Immune RPR: Non Reactive (12/07 0931)  HBsAg: Negative (07/20 1536)  HIV: Non Reactive (12/07 0931)  GBS:   unknown (PCR orderd) 2 hr Glucola: passed 78/94/101 Genetic screening: CF neg  Anatomy US nml  Prenatal Transfer  Tool  Maternal Diabetes: No Genetic Screening: Normal (CF) Maternal Ultrasounds/Referrals: Normal Fetal Ultrasounds or other Referrals:  None Maternal Substance Abuse:  Yes:  Type: Marijuana Significant Maternal Medications:  None Significant Maternal Lab Results: Lab values include: Other: GBS unknown  Results for orders placed or performed in visit on 12/31/15 (from the past 24 hour(s))  POCT urinalysis dipstick   Collection Time: 12/31/15  9:34 AM  Result Value Ref Range   Color, UA     Clarity, UA     Glucose, UA neg    Bilirubin, UA     Ketones, UA neg    Spec Grav, UA     Blood, UA neg    pH, UA     Protein, UA trace    Urobilinogen, UA     Nitrite, UA neg    Leukocytes, UA Negative Negative  Results for orders placed or performed in visit on 12/30/15 (from the past 24 hour(s))  POCT urinalysis dipstick   Collection Time: 12/30/15  4:30 PM  Result Value Ref Range   Color, UA     Clarity, UA     Glucose, UA neg    Bilirubin, UA     Ketones, UA neg    Spec Grav, UA     Blood, UA neg    pH, UA     Protein, UA neg    Urobilinogen, UA     Nitrite, UA neg    Leukocytes, UA Negative Negative    Patient Active Problem List   Diagnosis Date Noted  . Gestational hypertension w/o significant proteinuria in 3rd trimester 12/30/2015  . URI (upper respiratory infection) 08/01/2015  . Cough 08/01/2015  . Marijuana use 06/18/2015  . Supervision of normal pregnancy 06/11/2015  . Morbid obesity (HCC) 09/23/2014  . Dizziness and giddiness 09/23/2014  . Headache 09/23/2014  . PCO (polycystic ovaries) 03/08/2014    Assessment: ROSHNI [name] is a 25 y.o. G2P0010 at [redacted]w[redacted]d here from clinic for IOL for gestational hypertension. Asymptomatic. BP in 140s/76-102.  # GHTN: pre-e labs #Labor: IOL with cytotec, will likely do FB in 4 hrs #Pain: patient declined medications for pain #FWB: Cat 1 #ID: GBS unknown, PCR ordered #MOF: breast #MOC: undecided #Circ: outpatient    Palma Holter, MD PGY 1 Family Medicine   Seen and agree Plan Cytotec then Foley and Pitocin Aviva Signs, CNM

## 2015-12-31 NOTE — Progress Notes (Signed)
Patient ID: Patricia [name], female   DOB: 02-Apr-1991, 25 y.o.   MRN: [id number] G2P0010 [redacted]w[redacted]d Estimated Date of Delivery: 01/18/16  Blood pressure 150/90, pulse 72, weight 290 lb 9.6 oz (131.815 kg), last menstrual period 04/13/2015.   refer to the ob flow sheet for FH and FHR, also BP, Wt, Urine results: negative   Patient reports  + good fetal movement, denies any bleeding and no rupture of membranes symptoms or regular contractions. Patient complaints:  Pt denies headache, blurred vision, contractions or any complaints at this time. Pt further denies any Hx of HTN prior to gestational HTN. Pt's BP during exam is: 160/102  Questions were answered. Assessment:  1. LROB G2P0010 @ [redacted]w[redacted]d  2. Cervix is 1.5 cm dilated, 50% ephased -2/-3  3. Gestational hypertension.  4 GBS pending from yesterday eval Plan:   [facility] Gest HTN  By signing my name below, I, Marica Otter, attest that this documentation has been prepared under the direction and in the presence of Christin Bach, MD. Electronically Signed: Marica Otter, ED Scribe. 12/31/2015. 10:08 AM.   I personally performed the services described in this documentation, which was SCRIBED in my presence. The recorded information has been reviewed and considered accurate. It has been edited as necessary during review. Tilda Burrow, MD

## 2016-01-01 ENCOUNTER — Inpatient Hospital Stay (HOSPITAL_COMMUNITY): Payer: 59 | Admitting: Anesthesiology

## 2016-01-01 LAB — CBC
HEMATOCRIT: 35.1 % — AB (ref 36.0–46.0)
HEMOGLOBIN: 11.9 g/dL — AB (ref 12.0–15.0)
MCH: 26.5 pg (ref 26.0–34.0)
MCHC: 33.9 g/dL (ref 30.0–36.0)
MCV: 78.2 fL (ref 78.0–100.0)
Platelets: 243 10*3/uL (ref 150–400)
RBC: 4.49 MIL/uL (ref 3.87–5.11)
RDW: 14.7 % (ref 11.5–15.5)
WBC: 10.4 10*3/uL (ref 4.0–10.5)

## 2016-01-01 LAB — GC/CHLAMYDIA PROBE AMP
Chlamydia trachomatis, NAA: NEGATIVE
Neisseria gonorrhoeae by PCR: NEGATIVE

## 2016-01-01 LAB — RPR: RPR Ser Ql: NONREACTIVE

## 2016-01-01 LAB — HIV ANTIBODY (ROUTINE TESTING W REFLEX): HIV SCREEN 4TH GENERATION: NONREACTIVE

## 2016-01-01 LAB — STREP GP B NAA: STREP GROUP B AG: POSITIVE — AB

## 2016-01-01 MED ORDER — DIPHENHYDRAMINE HCL 50 MG/ML IJ SOLN: 12.5000 mg | INTRAMUSCULAR | Status: DC | PRN

## 2016-01-01 MED ORDER — TERBUTALINE SULFATE 1 MG/ML IJ SOLN
0.2500 mg | Freq: Once | INTRAMUSCULAR | Status: DC | PRN
  Filled 2016-01-01: qty 1

## 2016-01-01 MED ORDER — FENTANYL CITRATE (PF) 100 MCG/2ML IJ SOLN
100.0000 ug | Freq: Once | INTRAMUSCULAR | Status: DC
  Filled 2016-01-01: qty 2

## 2016-01-01 MED ORDER — MISOPROSTOL 200 MCG PO TABS
50.0000 ug | ORAL_TABLET | ORAL | Status: DC
  Administered 2016-01-01: 50 ug via ORAL
  Filled 2016-01-01 (×2): qty 0.5

## 2016-01-01 MED ORDER — EPHEDRINE 5 MG/ML INJ
10.0000 mg | INTRAVENOUS | Status: DC | PRN
  Filled 2016-01-01: qty 2

## 2016-01-01 MED ORDER — OXYTOCIN 10 UNIT/ML IJ SOLN
1.0000 m[IU]/min | INTRAMUSCULAR | Status: DC
  Administered 2016-01-01: 1 m[IU]/min via INTRAVENOUS
  Filled 2016-01-01: qty 4

## 2016-01-01 MED ORDER — LIDOCAINE HCL (PF) 1 % IJ SOLN
INTRAMUSCULAR | Status: DC | PRN
  Administered 2016-01-01: 2 mL via EPIDURAL
  Administered 2016-01-01: 5 mL via EPIDURAL
  Administered 2016-01-01: 3 mL via EPIDURAL

## 2016-01-01 MED ORDER — PHENYLEPHRINE 40 MCG/ML (10ML) SYRINGE FOR IV PUSH (FOR BLOOD PRESSURE SUPPORT)
80.0000 ug | PREFILLED_SYRINGE | INTRAVENOUS | Status: DC | PRN
  Filled 2016-01-01: qty 20
  Filled 2016-01-01: qty 2

## 2016-01-01 MED ORDER — FENTANYL 2.5 MCG/ML BUPIVACAINE 1/10 % EPIDURAL INFUSION (WH - ANES)
14.0000 mL/h | INTRAMUSCULAR | Status: DC | PRN
  Administered 2016-01-01 – 2016-01-02 (×3): 14 mL/h via EPIDURAL
  Filled 2016-01-01 (×2): qty 125

## 2016-01-01 MED ORDER — OXYTOCIN 10 UNIT/ML IJ SOLN
1.0000 m[IU]/min | INTRAVENOUS | Status: DC
  Administered 2016-01-01: 6 m[IU]/min via INTRAVENOUS
  Administered 2016-01-01: 25 m[IU]/min via INTRAVENOUS
  Administered 2016-01-01: 5 m[IU]/min via INTRAVENOUS
  Administered 2016-01-01: 9 m[IU]/min via INTRAVENOUS
  Administered 2016-01-01: 10 m[IU]/min via INTRAVENOUS
  Administered 2016-01-01: 13 m[IU]/min via INTRAVENOUS
  Administered 2016-01-01: 17 m[IU]/min via INTRAVENOUS
  Administered 2016-01-01: 21 m[IU]/min via INTRAVENOUS
  Administered 2016-01-01: 15 m[IU]/min via INTRAVENOUS
  Administered 2016-01-01: 8 m[IU]/min via INTRAVENOUS
  Administered 2016-01-01: 4 m[IU]/min via INTRAVENOUS
  Administered 2016-01-01: 11 m[IU]/min via INTRAVENOUS
  Administered 2016-01-01: 7 m[IU]/min via INTRAVENOUS
  Administered 2016-01-01: 23 m[IU]/min via INTRAVENOUS
  Administered 2016-01-01: 19 m[IU]/min via INTRAVENOUS

## 2016-01-01 MED ORDER — FENTANYL CITRATE (PF) 100 MCG/2ML IJ SOLN
100.0000 ug | Freq: Once | INTRAMUSCULAR | Status: AC
  Administered 2016-01-01: 100 ug via INTRAVENOUS
  Filled 2016-01-01: qty 2

## 2016-01-01 NOTE — Progress Notes (Signed)
Labor Progress Note  Patricia [name] is a 25 y.o. G2P0010 at [redacted]w[redacted]d  admitted for induction of labor due to [facility]. On Pit   S:  Doing well, no concerns, not feeling contractions    O:  BP 144/81 mmHg  Pulse 81  Temp(Src) 98.4 F (36.9 C) (Oral)  Resp 18  Ht  (1.651 m)  Wt 131.543 kg (290 lb)  BMI 48.26 kg/m2  LMP 04/13/2015 (Exact Date)    FHT:  FHR: 120 bpm, variability: moderate,  accelerations:  Present,  decelerations:  Absent (strip difficult to read) UC:  2-4 mins SVE:   Dilation: 5 Effacement (%): 60, 70 Station: -3 Exam by:: Wenda Low, RN  Intact  Pitocin @ 26mu/min  Labs: Lab Results  Component Value Date   WBC 10.3 12/31/2015   HGB 11.6* 12/31/2015   HCT 34.7* 12/31/2015   MCV 77.8* 12/31/2015   PLT 265 12/31/2015    Assessment / Plan: 25 y.o. G2P0010 [redacted]w[redacted]d active labor  Induction of labor due to GHTN,  progressing well on pitocin  Labor: Progressing on Pitocin, will continue to increase then AROM Fetal Wellbeing:  Category I Pain Control:  patient declines pain medications Anticipated MOD:  NSVD  Palma Holter, MD PGY 1 Family Medicine

## 2016-01-01 NOTE — Progress Notes (Signed)
Patricia [name] is a 25 y.o. G2P0010 at [redacted]w[redacted]d admitted for induction of labor due to Hypertension.  Subjective: Not feeling ctx as painful; s/p foley; Pit started almost an hour ago  Objective: prior BP 140/59 BP 155/89 mmHg  Pulse 95  Temp(Src) 97.6 F (36.4 C) (Oral)  Resp 16  Ht  (1.651 m)  Wt 131.543 kg (290 lb)  BMI 48.26 kg/m2  LMP 04/13/2015 (Exact Date)      FHT:  FHR: 120s bpm, variability: moderate,  accelerations:  Present,  decelerations:  Absent UC:   irregular, every 2-4 minutes SVE:   Dilation: 5 Effacement (%): 60, 70 Station: -3 Exam by:: Wenda Low, RN   Labs: Lab Results  Component Value Date   WBC 10.3 12/31/2015   HGB 11.6* 12/31/2015   HCT 34.7* 12/31/2015   MCV 77.8* 12/31/2015   PLT 265 12/31/2015    Assessment / Plan: IUP@37 .4wks gHTN  Continue to increase Pitocin to achieve active labor  Cam Hai CNM 01/01/2016, 6:24 AM

## 2016-01-01 NOTE — Progress Notes (Signed)
   Patricia [name] is a 25 y.o. G2P0010 at [redacted]w[redacted]d  admitted for induction of labor due to [facility].  Subjective:  Comfortable with epidural.  A few hours ago, pitocin line was noted to be disconnected from pt and infusing into the floor. Pit restarted at 2 mu/min Objective: Filed Vitals:   01/01/16 1912 01/01/16 1916 01/01/16 1930 01/01/16 2000  BP: 133/68 134/62 139/62 144/70  Pulse: 78 77 77 84  Temp:   98.5 F (36.9 C)   TempSrc:   Oral   Resp:   20   Height:      Weight:      SpO2:          FHT:  FHR: 140 bpm, variability: moderate,  accelerations:  Present,  decelerations:  Present some early decels UC:   regular, every 2 minutes SVE:   Dilation: 6 Effacement (%): 90 Station: -2, -3 Exam by:: TWillis, RNC Pitocin @ 14 mu/min  Labs: Lab Results  Component Value Date   WBC 10.4 01/01/2016   HGB 11.9* 01/01/2016   HCT 35.1* 01/01/2016   MCV 78.2 01/01/2016   PLT 243 01/01/2016    Assessment / Plan: Induction of labor due to gestational hypertension,  progressing well on pitocin  Labor: Progressing normally Fetal Wellbeing:  Category I Pain Control:  Epidural Anticipated MOD:  NSVD  CRESENZO-DISHMAN,Patricia [name] 01/01/2016, 8:54 PM

## 2016-01-01 NOTE — Anesthesia Preprocedure Evaluation (Addendum)
Anesthesia Evaluation  Patient identified by MRN, date of birth, ID band Patient awake    Reviewed: Allergy & Precautions, NPO status , Patient's Chart, lab work & pertinent test results  History of Anesthesia Complications Negative for: history of anesthetic complications  Airway Mallampati: III  TM Distance: >3 FB Neck ROM: Full    Dental  (+) Teeth Intact, Dental Advisory Given   Pulmonary neg pulmonary ROS,    Pulmonary exam normal breath sounds clear to auscultation       Cardiovascular hypertension (GHTN), Normal cardiovascular exam Rhythm:Regular Rate:Normal     Neuro/Psych  Headaches, negative psych ROS   GI/Hepatic negative GI ROS, Neg liver ROS,   Endo/Other  negative endocrine ROS  Renal/GU negative Renal ROS     Musculoskeletal negative musculoskeletal ROS (+)   Abdominal   Peds  Hematology  (+) Blood dyscrasia, anemia , Plt 243k   Anesthesia Other Findings Day of surgery medications reviewed with the patient.  Reproductive/Obstetrics (+) Pregnancy                            Anesthesia Physical Anesthesia Plan  ASA: III  Anesthesia Plan: Epidural   Post-op Pain Management:    Induction:   Airway Management Planned:   Additional Equipment:   Intra-op Plan:   Post-operative Plan:   Informed Consent: I have reviewed the patients History and Physical, chart, labs and discussed the procedure including the risks, benefits and alternatives for the proposed anesthesia with the patient or authorized representative who has indicated his/her understanding and acceptance.   Dental advisory given  Plan Discussed with:   Anesthesia Plan Comments: (Patient identified. Risks/Benefits/Options discussed with patient including but not limited to bleeding, infection, nerve damage, paralysis, failed block, incomplete pain control, headache, blood pressure changes, nausea, vomiting,  reactions to medication both or allergic, itching and postpartum back pain. Confirmed with bedside nurse the patient's most recent platelet count. Confirmed with patient that they are not currently taking any anticoagulation, have any bleeding history or any family history of bleeding disorders. Patient expressed understanding and wished to proceed. All questions were answered. )        Anesthesia Quick Evaluation

## 2016-01-01 NOTE — Anesthesia Procedure Notes (Signed)
Epidural Patient location during procedure: OB  Staffing Anesthesiologist: TURK, STEPHEN EDWARD Performed by: anesthesiologist   Preanesthetic Checklist Completed: patient identified, pre-op evaluation, timeout performed, IV checked, risks and benefits discussed and monitors and equipment checked  Epidural Patient position: sitting Prep: DuraPrep Patient monitoring: blood pressure and continuous pulse ox Approach: midline Location: L3-L4 Injection technique: LOR air  Needle:  Needle type: Tuohy  Needle gauge: 17 G Needle length: 9 cm Needle insertion depth: 7 cm Catheter size: 19 Gauge Catheter at skin depth: 12 cm Test dose: negative and Other (1% Lidocaine)  Additional Notes Patient identified.  Risk benefits discussed including failed block, incomplete pain control, headache, nerve damage, paralysis, blood pressure changes, nausea, vomiting, reactions to medication both toxic or allergic, and postpartum back pain.  Patient expressed understanding and wished to proceed.  All questions were answered.  Sterile technique used throughout procedure and epidural site dressed with sterile barrier dressing. No paresthesia or other complications noted. The patient did not experience any signs of intravascular injection such as tinnitus or metallic taste in mouth nor signs of intrathecal spread such as rapid motor block. Please see nursing notes for vital signs. Reason for block:procedure for pain   

## 2016-01-01 NOTE — Progress Notes (Signed)
Patient ID: Patricia [name], female   DOB: 1991-02-05, 25 y.o.   MRN: [id number]  Foley placed approx 4hrs ago.  EFM 130s, +accels, no decels No ctx per toco BPs 143/72, 141/70, other VSS  IUP@37 .4wks gHTN- stable BPs  Will add PO cytotec for additional ripening Leave FB in place until it comes out  Cam Hai 01/01/2016 12:39 AM

## 2016-01-02 ENCOUNTER — Encounter (HOSPITAL_COMMUNITY): Payer: Self-pay | Admitting: *Deleted

## 2016-01-02 DIAGNOSIS — O99324 Drug use complicating childbirth: Secondary | ICD-10-CM

## 2016-01-02 DIAGNOSIS — O134 Gestational [pregnancy-induced] hypertension without significant proteinuria, complicating childbirth: Secondary | ICD-10-CM

## 2016-01-02 DIAGNOSIS — Z3A37 37 weeks gestation of pregnancy: Secondary | ICD-10-CM

## 2016-01-02 LAB — CBC
HCT: 32.9 % — ABNORMAL LOW (ref 36.0–46.0)
HEMOGLOBIN: 11.1 g/dL — AB (ref 12.0–15.0)
MCH: 26.2 pg (ref 26.0–34.0)
MCHC: 33.7 g/dL (ref 30.0–36.0)
MCV: 77.6 fL — ABNORMAL LOW (ref 78.0–100.0)
PLATELETS: 221 10*3/uL (ref 150–400)
RBC: 4.24 MIL/uL (ref 3.87–5.11)
RDW: 14.8 % (ref 11.5–15.5)
WBC: 15.3 10*3/uL — ABNORMAL HIGH (ref 4.0–10.5)

## 2016-01-02 MED ORDER — WITCH HAZEL-GLYCERIN EX PADS
1.0000 | MEDICATED_PAD | CUTANEOUS | Status: DC | PRN
Start: 2016-01-02 — End: 2016-01-04

## 2016-01-02 MED ORDER — METHYLERGONOVINE MALEATE 0.2 MG/ML IJ SOLN: 0.2000 mg | INTRAMUSCULAR | Status: DC | PRN

## 2016-01-02 MED ORDER — PRENATAL MULTIVITAMIN CH
1.0000 | ORAL_TABLET | Freq: Every day | ORAL | Status: DC
  Administered 2016-01-03 – 2016-01-04 (×2): 1 via ORAL
  Filled 2016-01-02 (×3): qty 1

## 2016-01-02 MED ORDER — SENNOSIDES-DOCUSATE SODIUM 8.6-50 MG PO TABS
2.0000 | ORAL_TABLET | ORAL | Status: DC
  Administered 2016-01-02 – 2016-01-04 (×2): 2 via ORAL
  Filled 2016-01-02 (×2): qty 2

## 2016-01-02 MED ORDER — BENZOCAINE-MENTHOL 20-0.5 % EX AERO: 1.0000 "application " | INHALATION_SPRAY | CUTANEOUS | Status: DC | PRN

## 2016-01-02 MED ORDER — TETANUS-DIPHTH-ACELL PERTUSSIS 5-2.5-18.5 LF-MCG/0.5 IM SUSP
0.5000 mL | Freq: Once | INTRAMUSCULAR | Status: AC
  Administered 2016-01-02: 0.5 mL via INTRAMUSCULAR

## 2016-01-02 MED ORDER — DIPHENHYDRAMINE HCL 25 MG PO CAPS: 25.0000 mg | ORAL_CAPSULE | Freq: Four times a day (QID) | ORAL | Status: DC | PRN

## 2016-01-02 MED ORDER — METHYLERGONOVINE MALEATE 0.2 MG PO TABS: 0.2000 mg | ORAL_TABLET | ORAL | Status: DC | PRN

## 2016-01-02 MED ORDER — BISACODYL 10 MG RE SUPP: 10.0000 mg | Freq: Every day | RECTAL | Status: DC | PRN

## 2016-01-02 MED ORDER — ACETAMINOPHEN 325 MG PO TABS: 650.0000 mg | ORAL_TABLET | ORAL | Status: DC | PRN

## 2016-01-02 MED ORDER — FLEET ENEMA 7-19 GM/118ML RE ENEM: 1.0000 | ENEMA | Freq: Every day | RECTAL | Status: DC | PRN

## 2016-01-02 MED ORDER — ZOLPIDEM TARTRATE 5 MG PO TABS
5.0000 mg | ORAL_TABLET | Freq: Every evening | ORAL | Status: DC | PRN
Start: 2016-01-02 — End: 2016-01-04

## 2016-01-02 MED ORDER — LANOLIN HYDROUS EX OINT: TOPICAL_OINTMENT | CUTANEOUS | Status: DC | PRN

## 2016-01-02 MED ORDER — FERROUS SULFATE 325 (65 FE) MG PO TABS
325.0000 mg | ORAL_TABLET | Freq: Two times a day (BID) | ORAL | Status: DC
  Administered 2016-01-02 – 2016-01-04 (×5): 325 mg via ORAL
  Filled 2016-01-02 (×5): qty 1

## 2016-01-02 MED ORDER — SIMETHICONE 80 MG PO CHEW: 80.0000 mg | CHEWABLE_TABLET | ORAL | Status: DC | PRN

## 2016-01-02 MED ORDER — DIBUCAINE 1 % RE OINT: 1.0000 "application " | TOPICAL_OINTMENT | RECTAL | Status: DC | PRN

## 2016-01-02 MED ORDER — ONDANSETRON HCL 4 MG/2ML IJ SOLN: 4.0000 mg | INTRAMUSCULAR | Status: DC | PRN

## 2016-01-02 MED ORDER — ONDANSETRON HCL 4 MG PO TABS: 4.0000 mg | ORAL_TABLET | ORAL | Status: DC | PRN

## 2016-01-02 MED ORDER — MEASLES, MUMPS & RUBELLA VAC ~~LOC~~ INJ
0.5000 mL | INJECTION | Freq: Once | SUBCUTANEOUS | Status: DC
  Filled 2016-01-02: qty 0.5

## 2016-01-02 MED ORDER — IBUPROFEN 600 MG PO TABS
600.0000 mg | ORAL_TABLET | Freq: Four times a day (QID) | ORAL | Status: DC
  Administered 2016-01-02 – 2016-01-04 (×9): 600 mg via ORAL
  Filled 2016-01-02 (×10): qty 1

## 2016-01-02 NOTE — Anesthesia Postprocedure Evaluation (Signed)
Anesthesia Post Note  Patient: Patricia [name]  Procedure(s) Performed: * No procedures listed *  Patient location during evaluation: Mother Baby Anesthesia Type: Epidural Level of consciousness: awake and alert Pain management: pain level controlled Vital Signs Assessment: post-procedure vital signs reviewed and stable Respiratory status: spontaneous breathing, nonlabored ventilation and respiratory function stable Cardiovascular status: stable Postop Assessment: no headache, no backache and epidural receding Anesthetic complications: no Comments: Pt stated epidural worked well, Pain score currently 0    Last Vitals:  Filed Vitals:   01/02/16 0515 01/02/16 0615  BP: 140/79 147/78  Pulse: 89 101  Temp: 36.9 C 37.2 C  Resp: 18 18    Last Pain:  Filed Vitals:   01/02/16 0856  PainSc: 0-No pain                 Kevonna Nolte

## 2016-01-02 NOTE — Lactation Note (Signed)
This note was copied from a baby's chart. Lactation Consultation Note  Baby sleeping STS on mother's chest. Hand expressed and gave baby colostrum on spoon. Then assisted w/ latching in football hold. Encouraged mother to prepump before latching. Reviewed basics and cluster feeding. Mom made aware of O/P services, breastfeeding support groups, community resources, and our phone # for post-discharge questions.  Mom encouraged to feed baby 8-12 times/24 hours and with feeding cues.    Patient Name: Patricia [name] [id number] Date: 01/02/2016 Reason for consult: Initial assessment   Maternal Data    Feeding Feeding Type: Breast Fed Length of feed: 15 min  LATCH Score/Interventions Latch: Grasps breast easily, tongue down, lips flanged, rhythmical sucking.  Audible Swallowing: A few with stimulation  Type of Nipple: Everted at rest and after stimulation  Comfort (Breast/Nipple): Soft / non-tender     Hold (Positioning): Assistance needed to correctly position infant at breast and maintain latch.  LATCH Score: 8  Lactation Tools Discussed/Used     Consult Status Consult Status: Follow-up Date: 01/03/16 Follow-up type: In-patient    Dahlia Byes [facility] 01/02/2016, 3:43 PM

## 2016-01-02 NOTE — Progress Notes (Signed)
Labor Progress Note Patricia [name] is a 25 y.o. G2P0010 at [redacted]w[redacted]d presented for IOL for GHTN. S: Pt feeling lots of pressure - pushed effectively for 1.5 hours with good progression.  O:  BP 148/68 mmHg  Pulse 101  Temp(Src) 98.2 F (36.8 C) (Oral)  Resp 20  Ht  (1.651 m)  Wt 131.543 kg (290 lb)  BMI 48.26 kg/m2  SpO2 100%  LMP 04/13/2015 (Exact Date) EFM: 140s/mod/+accels  CVE: Dilation: 10 Dilation Complete Date: 01/02/16 Dilation Complete Time: 2350 Effacement (%): 100 Cervical Position: Posterior Station: +1, +2 Presentation: Vertex Exam by:: A. Yahshua Thibault, MD   A&P: 25 y.o. G2P0010 [redacted]w[redacted]d here for IOL for GHTN. #Labor: continue Pit -labor down for 1 hr to prevent maternal fatigue #Pain: epidural #FWB: cat 1 #GBS: pos - PCN  Wynne Dust, MD 1:44 AM

## 2016-01-03 MED ORDER — AMLODIPINE BESYLATE 5 MG PO TABS
5.0000 mg | ORAL_TABLET | Freq: Every day | ORAL | Status: DC
  Administered 2016-01-03 – 2016-01-04 (×2): 5 mg via ORAL
  Filled 2016-01-03 (×3): qty 1

## 2016-01-03 MED ORDER — TRIAMTERENE-HCTZ 75-50 MG PO TABS
1.0000 | ORAL_TABLET | Freq: Every day | ORAL | Status: DC
  Administered 2016-01-03: 1 via ORAL
  Filled 2016-01-03 (×2): qty 1

## 2016-01-03 NOTE — Progress Notes (Signed)
Pharmacy contacted re HCTZ and Norvasc doses. Scheduled for 10am 01/04/16 per [facility].

## 2016-01-03 NOTE — Lactation Note (Signed)
This note was copied from a baby's chart. Lactation Consultation Note  Patient Name: Patricia [name] [id number] Date: 01/03/2016 Reason for consult: Follow-up assessment Baby at 37 hr of life and mom is reporting sore nipples. No skin break down noted and she reports very mild pinching when baby latches. She thinks the issue was yesterday when the baby was learning but is getting better today. Discussed nipple care, manual expression, breast changes, and baby behavior. Baby was latched upon entry, mom was on the couch eating her lunch while the baby was eating. She was relaxed and voiced no concerns. She is aware of OP services and support group.   Maternal Data Has patient been taught Hand Expression?: Yes Does the patient have breastfeeding experience prior to this delivery?: No  Feeding Feeding Type: Breast Fed  LATCH Score/Interventions                      Lactation Tools Discussed/Used     Consult Status Consult Status: PRN    Patricia [name] 01/03/2016, 5:00 PM

## 2016-01-03 NOTE — Progress Notes (Signed)
Post Partum Day #1 Subjective: no complaints, up ad lib and tolerating PO; breastfeeding; undecided re contraception; started on Norvasc and Maxzide during the night- denies H/A or RUQ pain  Objective: Blood pressure 139/96, pulse 81, temperature 98 F (36.7 C), temperature source Oral, resp. rate 18, height  (1.651 m), weight 131.543 kg (290 lb), last menstrual period 04/13/2015, SpO2 100 %, unknown if currently breastfeeding.  Physical Exam:  General: alert, cooperative and no distress Lochia: appropriate Uterine Fundus: firm DVT Evaluation: No evidence of DVT seen on physical exam.   Recent Labs  01/01/16 1649 01/02/16 0435  HGB 11.9* 11.1*  HCT 35.1* 32.9*    Assessment/Plan: Plan for discharge tomorrow  Reassess BP and plan to send home 01/04/16 with antihypertensives   LOS: 3 days   Cam Hai CNM 01/03/2016, 9:08 AM

## 2016-01-04 MED ORDER — AMLODIPINE BESYLATE 5 MG PO TABS: 5.0000 mg | ORAL_TABLET | Freq: Every day | ORAL | Status: DC

## 2016-01-04 MED ORDER — ACETAMINOPHEN 325 MG PO TABS: 650.0000 mg | ORAL_TABLET | ORAL | Status: DC | PRN

## 2016-01-04 MED ORDER — IBUPROFEN 600 MG PO TABS: 600.0000 mg | ORAL_TABLET | Freq: Four times a day (QID) | ORAL | Status: DC

## 2016-01-04 MED ORDER — TRIAMTERENE-HCTZ 37.5-25 MG PO TABS
2.0000 | ORAL_TABLET | Freq: Every day | ORAL | Status: DC
  Administered 2016-01-04: 2 via ORAL
  Filled 2016-01-04 (×2): qty 2

## 2016-01-04 NOTE — Discharge Instructions (Signed)
Hypertension During Pregnancy °Hypertension, or high blood pressure, is when there is extra pressure inside your blood vessels that carry blood from the heart to the rest of your body (arteries). It can happen at any time in life, including pregnancy. Hypertension during pregnancy can cause problems for you and your baby. Your baby might not weigh as much as he or she should at birth or might be born early (premature). Very bad cases of hypertension during pregnancy can be life-threatening.  °Different types of hypertension can occur during pregnancy. These include: °· Chronic hypertension. This happens when a woman has hypertension before pregnancy and it continues during pregnancy. °· Gestational hypertension. This is when hypertension develops during pregnancy. °· Preeclampsia or toxemia of pregnancy. This is a very serious type of hypertension that develops only during pregnancy. It affects the whole body and can be very dangerous for both mother and baby.   °Gestational hypertension and preeclampsia usually go away after your baby is born. Your blood pressure will likely stabilize within 6 weeks. Women who have hypertension during pregnancy have a greater chance of developing hypertension later in life or with future pregnancies. °RISK FACTORS °There are certain factors that make it more likely for you to develop hypertension during pregnancy. These include: °· Having hypertension before pregnancy. °· Having hypertension during a previous pregnancy. °· Being overweight. °· Being older than 40 years. °· Being pregnant with more than one baby. °· Having diabetes or kidney problems. °SIGNS AND SYMPTOMS °Chronic and gestational hypertension rarely cause symptoms. Preeclampsia has symptoms, which may include: °· Increased protein in your urine. Your health care provider will check for this at every prenatal visit. °· Swelling of your hands and face. °· Rapid weight gain. °· Headaches. °· Visual changes. °· Being  bothered by light. °· Abdominal pain, especially in the upper right area. °· Chest pain. °· Shortness of breath. °· Increased reflexes. °· Seizures. These occur with a more severe form of preeclampsia, called eclampsia. °DIAGNOSIS  °You may be diagnosed with hypertension during a regular prenatal exam. At each prenatal visit, you may have: °· Your blood pressure checked. °· A urine test to check for protein in your urine. °The type of hypertension you are diagnosed with depends on when you developed it. It also depends on your specific blood pressure reading. °· Developing hypertension before 20 weeks of pregnancy is consistent with chronic hypertension. °· Developing hypertension after 20 weeks of pregnancy is consistent with gestational hypertension. °· Hypertension with increased urinary protein is diagnosed as preeclampsia. °· Blood pressure measurements that stay above 160 systolic or 110 diastolic are a sign of severe preeclampsia. °TREATMENT °Treatment for hypertension during pregnancy varies. Treatment depends on the type of hypertension and how serious it is. °· If you take medicine for chronic hypertension, you may need to switch medicines. °¨ Medicines called ACE inhibitors should not be taken during pregnancy. °¨ Low-dose aspirin may be suggested for women who have risk factors for preeclampsia. °· If you have gestational hypertension, you may need to take a blood pressure medicine that is safe during pregnancy. Your health care provider will recommend the correct medicine. °· If you have severe preeclampsia, you may need to be in the hospital. Health care providers will watch you and your baby very closely. You also may need to take medicine called magnesium sulfate to prevent seizures and lower blood pressure. °· Sometimes, an early delivery is needed. This may be the case if the condition worsens. It would be   done to protect you and your baby. The only cure for preeclampsia is delivery.  Your health  care provider may recommend that you take one low-dose aspirin (81 mg) each day to help prevent high blood pressure during your pregnancy if you are at risk for preeclampsia. You may be at risk for preeclampsia if:  You had preeclampsia or eclampsia during a previous pregnancy.  Your baby did not grow as expected during a previous pregnancy.  You experienced preterm birth with a previous pregnancy.  You experienced a separation of the placenta from the uterus (placental abruption) during a previous pregnancy.  You experienced the loss of your baby during a previous pregnancy.  You are pregnant with more than one baby.  You have other medical conditions, such as diabetes or an autoimmune disease. HOME CARE INSTRUCTIONS  Schedule and keep all of your regular prenatal care appointments. This is important.  Take medicines only as directed by your health care provider. Tell your health care provider about all medicines you take.  Eat as little salt as possible.  Get regular exercise.  Do not drink alcohol.  Do not use tobacco products.  Do not drink products with caffeine.  Lie on your left side when resting. SEEK IMMEDIATE MEDICAL CARE IF:  You have severe abdominal pain.  You have sudden swelling in your hands, ankles, or face.  You gain 4 pounds (1.8 kg) or more in 1 week.  You vomit repeatedly.  You have vaginal bleeding.  You do not feel your baby moving as much.  You have a headache.  You have blurred or double vision.  You have muscle twitching or spasms.  You have shortness of breath.  You have blue fingernails or lips.  You have blood in your urine. MAKE SURE YOU:  Understand these instructions.  Will watch your condition.  Will get help right away if you are not doing well or get worse.   This information is not intended to replace advice given to you by your health care provider. Make sure you discuss any questions you have with your health care  provider.   Document Released: 07/27/2011 Document Revised: 11/29/2014 Document Reviewed: 06/07/2013 Elsevier Interactive Patient Education 2016 Elsevier Inc. Vaginal Delivery, Care After Refer to this sheet in the next few weeks. These discharge instructions provide you with information on caring for yourself after delivery. Your health care provider may also give you specific instructions. Your treatment has been planned according to the most current medical practices available, but problems sometimes occur. Call your health care provider if you have any problems or questions after you go home. HOME CARE INSTRUCTIONS  Take over-the-counter or prescription medicines only as directed by your health care provider or pharmacist.  Do not drink alcohol, especially if you are breastfeeding or taking medicine to relieve pain.  Do not chew or smoke tobacco.  Do not use illegal drugs.  Continue to use good perineal care. Good perineal care includes:  Wiping your perineum from front to back.  Keeping your perineum clean.  Do not use tampons or douche until your health care provider says it is okay.  Shower, wash your hair, and take tub baths as directed by your health care provider.  Wear a well-fitting bra that provides breast support.  Eat healthy foods.  Drink enough fluids to keep your urine clear or pale yellow.  Eat high-fiber foods such as whole grain cereals and breads, brown rice, beans, and fresh fruits and vegetables every  day. These foods may help prevent or relieve constipation.  Follow your health care provider's recommendations regarding resumption of activities such as climbing stairs, driving, lifting, exercising, or traveling.  Talk to your health care provider about resuming sexual activities. Resumption of sexual activities is dependent upon your risk of infection, your rate of healing, and your comfort and desire to resume sexual activity.  Try to have someone help  you with your household activities and your newborn for at least a few days after you leave the hospital.  Rest as much as possible. Try to rest or take a nap when your newborn is sleeping.  Increase your activities gradually.  Keep all of your scheduled postpartum appointments. It is very important to keep your scheduled follow-up appointments. At these appointments, your health care provider will be checking to make sure that you are healing physically and emotionally. SEEK MEDICAL CARE IF:   You are passing large clots from your vagina. Save any clots to show your health care provider.  You have a foul smelling discharge from your vagina.  You have trouble urinating.  You are urinating frequently.  You have pain when you urinate.  You have a change in your bowel movements.  You have increasing redness, pain, or swelling near your vaginal incision (episiotomy) or vaginal tear.  You have pus draining from your episiotomy or vaginal tear.  Your episiotomy or vaginal tear is separating.  You have painful, hard, or reddened breasts.  You have a severe headache.  You have blurred vision or see spots.  You feel sad or depressed.  You have thoughts of hurting yourself or your newborn.  You have questions about your care, the care of your newborn, or medicines.  You are dizzy or light-headed.  You have a rash.  You have nausea or vomiting.  You were breastfeeding and have not had a menstrual period within 12 weeks after you stopped breastfeeding.  You are not breastfeeding and have not had a menstrual period by the 12th week after delivery.  You have a fever. SEEK IMMEDIATE MEDICAL CARE IF:   You have persistent pain.  You have chest pain.  You have shortness of breath.  You faint.  You have leg pain.  You have stomach pain.  Your vaginal bleeding saturates two or more sanitary pads in 1 hour.   This information is not intended to replace advice given to  you by your health care provider. Make sure you discuss any questions you have with your health care provider.   Document Released: 11/05/2000 Document Revised: 07/30/2015 Document Reviewed: 07/05/2012 Elsevier Interactive Patient Education [employer]

## 2016-01-04 NOTE — Lactation Note (Signed)
This note was copied from a baby's chart. Lactation Consultation Note  Patient Name: Patricia [name] [id number] Date: 01/04/2016 Reason for consult: Follow-up assessment  Baby 55 hours old. Mom reports baby is nursing well, and cluster-fed through the night. Mom reports that she is hearing swallows at the breast and baby seems satisfied when he comes off the breast. Discussed normal newborn behavior and progression of milk coming to volume. Enc mom to continue to nurse with cues. Referred mom to Baby and Me booklet for number of diapers to expect by day of life, and EBM storage guidelines. Discussed engorgement prevention/treatment. Mom aware of OP/BFSG and LC phone line assistance after D/C.    Maternal Data    Feeding Feeding Type: Breast Fed Length of feed: 15 min  LATCH Score/Interventions Latch: Grasps breast easily, tongue down, lips flanged, rhythmical sucking.  Audible Swallowing: Spontaneous and intermittent  Type of Nipple: Everted at rest and after stimulation  Comfort (Breast/Nipple): Soft / non-tender     Hold (Positioning): No assistance needed to correctly position infant at breast.  LATCH Score: 10  Lactation Tools Discussed/Used     Consult Status Consult Status: PRN    Geralynn Ochs 01/04/2016, 11:04 AM

## 2016-01-04 NOTE — Clinical Social Work Maternal (Signed)
  CLINICAL SOCIAL WORK MATERNAL/CHILD NOTE  Patient Details  Name: Patricia [name] MRN: [id number] Date of Birth: 09/05/1991  Date:  01/04/2016  Clinical Social Worker Initiating Note:  Norlene Duel, LCSW Date/ Time Initiated:  01/04/16/0900     Child's Name:  Patricia [name]   Legal Guardian:   (Parents Patricia [name] and Patricia [name])   Need for Interpreter:  None   Date of Referral:  01/03/16     Reason for Referral:  Other (Comment)   Referral Source:  [facility]   Address:  [place], [place] [address]  Phone number:   [phone])   Household Members:  Self, Significant Other   Natural Supports (not living in the home):  Extended Family   Professional Supports: None   Employment: Full-time   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  Met with mother who was pleasant and receptive to CSW.   She has no other dependents.   FOB is reportedly supportive.  They cohabitate.   MOB admits to hx of marijuana and states that she stopped once she became aware of the pregnancy.    She denies any hx of addiction or other illicit drug use.   UDS on newborn is negative.     She reports having extensive family support.  Informed that she is well prepared at home for newborn.    No acute social concerns related at this time.  Informed her of CSW availability  CSW Plan/Description:     No barriers to discharge Will continue to monitor drug screen  Heith Haigler J,  LCSW 01/04/2016, 9:39 AM

## 2016-01-04 NOTE — Discharge Summary (Signed)
OB Discharge Summary     Patient Name: Patricia [name] DOB: [birth year] MRN: [id number]  Date of admission: 12/31/2015 Delivering MD: Wynne Dust   Date of discharge: 01/04/2016  Admitting diagnosis: INDUCTION Intrauterine pregnancy: [redacted]w[redacted]d     Secondary diagnosis:  Active Problems:   Gestational hypertension w/o significant proteinuria in 3rd trimester  Additional problems: none     Discharge diagnosis: Term Pregnancy Delivered and Gestational Hypertension                                                                                                Post partum procedures:none  Augmentation: Pitocin, Cytotec and Foley Balloon  Complications: None  Hospital course:  Induction of Labor With Vaginal Delivery   25 y.o. yo G2P1011 at [redacted]w[redacted]d was admitted to the hospital 12/31/2015 for induction of labor.  Indication for induction: Gestational hypertension.  Patient had an uncomplicated labor course as follows: Membrane Rupture Time/Date: 2:59 PM ,01/01/2016   Intrapartum Procedures: Episiotomy: None [1]                                         Lacerations:  None [1]  Patient had delivery of a Viable infant.  Information for the patient's newborn:  Virna, Livengood [[id number]]  Delivery Method: Vaginal, Spontaneous Delivery (Filed from Delivery Summary)   GHTN: No severe-range pressures, did not meet preeclamsia diagnosis. BPs elevated PP, started on amlodipine 5 mg po qd. Told to schedule nurse only BP check at prenatal provider's office in 24-48 hours.  01/02/2016  Details of delivery can be found in separate delivery note.  Patient had a routine postpartum course. Patient is discharged home 01/04/2016.   Physical exam  Filed Vitals:   01/03/16 1725 01/03/16 2140 01/04/16 0031 01/04/16 0420  BP: 138/78 131/48 147/82 132/83  Pulse: 90 96 98 87  Temp: 98.5 F (36.9 C)     TempSrc: Oral     Resp: 18     Height:      Weight:      SpO2:       General: alert, cooperative and  no distress Lochia: appropriate Uterine Fundus: firm Incision: N/A DVT Evaluation: No cords or calf tenderness. No significant calf/ankle edema. Labs: Lab Results  Component Value Date   WBC 15.3* 01/02/2016   HGB 11.1* 01/02/2016   HCT 32.9* 01/02/2016   MCV 77.6* 01/02/2016   PLT 221 01/02/2016   CMP Latest Ref Rng 12/31/2015  Glucose 65 - 99 mg/dL 85  BUN 6 - 20 mg/dL 8  Creatinine 4.13 - 2.44 mg/dL 0.10  Sodium 272 - 536 mmol/L 136  Potassium 3.5 - 5.1 mmol/L 4.2  Chloride 101 - 111 mmol/L 106  CO2 22 - 32 mmol/L 21(L)  Calcium 8.9 - 10.3 mg/dL 9.1  Total Protein 6.5 - 8.1 g/dL 6.4(L)  Total Bilirubin 0.3 - 1.2 mg/dL 6.4(Q)  Alkaline Phos 38 - 126 U/L 90  AST 15 - 41 U/L 20  ALT 14 - 54 U/L 13(L)  Discharge instruction: per After Visit Summary and "Baby and Me Booklet".  After visit meds:    Medication List    STOP taking these medications        BIOTIN PO     omeprazole 20 MG capsule  Commonly known as:  PRILOSEC     ondansetron 8 MG disintegrating tablet  Commonly known as:  ZOFRAN-ODT     ROYAL JELLY PO      TAKE these medications        acetaminophen 325 MG tablet  Commonly known as:  TYLENOL  Take 2 tablets (650 mg total) by mouth every 4 (four) hours as needed (for pain scale < 4).     amLODipine 5 MG tablet  Commonly known as:  NORVASC  Take 1 tablet (5 mg total) by mouth daily.     B-12 PO  Take 1 tablet by mouth daily.     COMPLETE NATAL DHA 29-1-200 & 250 MG Misc  Take 1 tablet by mouth daily.     ibuprofen 600 MG tablet  Commonly known as:  ADVIL,MOTRIN  Take 1 tablet (600 mg total) by mouth every 6 (six) hours.     Triamcinolone Acetonide 0.025 % Lotn  Apply small amount twice daily as needed     Vitamin D3 5000 units Caps  Take 5,000 Units by mouth daily.        Diet: routine diet  Activity: Advance as tolerated. Pelvic rest for 2weeks.   Outpatient follow up:6 weeks; 1-2 days for BP check  Postpartum contraception:  undecided (counseled to discuss again at 6 wk pp visit)  Newborn Data: Live born female  Birth Weight: 7 lb (3175 g) APGAR: 7, 9  Baby Feeding: Breast Disposition:home with mother   01/04/2016 Silvano Bilis, MD

## 2016-01-04 NOTE — Progress Notes (Signed)
Discharge instructions reviewed with pt. Pt states that she understands when to follow up with MD and when to seek medical attention. Informed pt to take new rx (amilodipine) around the same time daily. Pt states understanding. NT walked pt, FOB, and infant to exit of hospital

## 2016-01-07 ENCOUNTER — Ambulatory Visit (INDEPENDENT_AMBULATORY_CARE_PROVIDER_SITE_OTHER): Payer: 59 | Admitting: Obstetrics and Gynecology

## 2016-01-07 ENCOUNTER — Encounter: Payer: Self-pay | Admitting: Obstetrics and Gynecology

## 2016-01-07 VITALS — BP 140/90 | Ht 65.0 in | Wt 272.0 lb

## 2016-01-07 DIAGNOSIS — O133 Gestational [pregnancy-induced] hypertension without significant proteinuria, third trimester: Secondary | ICD-10-CM

## 2016-01-07 DIAGNOSIS — O135 Gestational [pregnancy-induced] hypertension without significant proteinuria, complicating the puerperium: Secondary | ICD-10-CM

## 2016-01-07 NOTE — Progress Notes (Signed)
Patient ID: DEONDRA LABRADOR, female   DOB: September 13, 1991, 25 y.o.   MRN: [id number]   [facility] Visit  Patient name: DENYSE FILLION MRN [id number]  Date of birth: 1991/08/04  CC & HPI:  LOUCINDA CROY is a 25 y.o. female presenting today for BP check, s/p gestational htn , sent home on Norvasc /d  ROS:  Denies h/a ,scotoma, blurry vision. Weight 270's   Pertinent History Reviewed:   Reviewed: Significant for svd after IOL Medical         Past Medical History  Diagnosis Date  . Polycystic disease, ovaries   . Migraine   . Migraines   . PCO (polycystic ovaries) 03/08/2014  . Pregnant 05/27/2015  . URI (upper respiratory infection) 08/01/2015  . Cough 08/01/2015  . Nausea and vomiting during pregnancy prior to [redacted] weeks gestation 08/01/2015                              Surgical Hx:    Past Surgical History  Procedure Laterality Date  . Tonsillectomy     Medications: Reviewed & Updated - see associated section                       Current outpatient prescriptions:  .  amLODipine (NORVASC) 5 MG tablet, Take 1 tablet (5 mg total) by mouth daily., Disp: 30 tablet, Rfl: 1 .  Cholecalciferol (VITAMIN D3) 5000 units CAPS, Take 5,000 Units by mouth daily., Disp: , Rfl:  .  Cyanocobalamin (B-12 PO), Take 1 tablet by mouth daily., Disp: , Rfl:  .  ibuprofen (ADVIL,MOTRIN) 600 MG tablet, Take 1 tablet (600 mg total) by mouth every 6 (six) hours., Disp: 90 tablet, Rfl: 3 .  Prenat-FeBis-FePro-FA-CA-Omega (COMPLETE NATAL DHA) 29-1-200 & 250 MG MISC, Take 1 tablet by mouth daily., Disp: 30 each, Rfl: 11 .  Triamcinolone Acetonide 0.025 % LOTN, Apply small amount twice daily as needed (Patient taking differently: Apply 1 application topically daily as needed (rash/itching). ), Disp: 60 mL, Rfl: 0 .  acetaminophen (TYLENOL) 325 MG tablet, Take 2 tablets (650 mg total) by mouth every 4 (four) hours as needed (for pain scale < 4). (Patient not taking: Reported on 01/07/2016), Disp: 90 tablet,  Rfl: 3   Social History: Reviewed -  reports that she has never smoked. She has never used smokeless tobacco.  Objective Findings:  Vitals: Blood pressure 140/90, height  (1.651 m), weight 272 lb (123.378 kg), last menstrual period 04/13/2015, currently breastfeeding. Weight down 18 pounds  Physical Examination: General appearance - alert, well appearing, and in no distress, oriented to person, place, and time and overweight Mental status - alert, oriented to person, place, and time, normal mood, behavior, speech, dress, motor activity, and thought processes Eyes - pupils equal and reactive, extraocular eye movements intact Abdomen - soft, nontender, nondistended, no masses or organomegaly Extremities - peripheral pulses normal, no pedal edema, no clubbing or cyanosis, reflexes 1+ no clonus Skin - normal coloration and turgor, no rashes, no suspicious skin lesions noted Minimal edema   Assessment & Plan:   A:  1. Gest htn ,still mildly elevated, needs current tx to continue 2. Has Rx Norvasc 5 qd.  P:  1. rechk 4 wk.

## 2016-01-12 ENCOUNTER — Encounter: Payer: 59 | Admitting: Obstetrics & Gynecology

## 2016-01-14 ENCOUNTER — Telehealth: Payer: Self-pay | Admitting: *Deleted

## 2016-01-14 NOTE — Telephone Encounter (Signed)
Pt states did [employer] send her FMLA forms. Informed pt been out of the office, will look today and contact her back.

## 2016-01-15 NOTE — Telephone Encounter (Signed)
Pt informed have not received FMLA forms. Pt states will contact employer and have them fax forms.

## 2016-01-30 ENCOUNTER — Telehealth: Payer: Self-pay | Admitting: *Deleted

## 2016-01-30 NOTE — Telephone Encounter (Signed)
Pat[facility]Karen[facility] called and stated that she had checked the pt's BP this morning and it was 122/100. She stated that the pt told her that she had not taken her BP meds yet and that she has an appointment at our office on the 15th.   I spoke with Dr. Emelda FearFerguson and he advised that it was probably due to the pt not taking her meds and it would be ok for pt to wait until her next appointment.

## 2016-02-04 ENCOUNTER — Ambulatory Visit (INDEPENDENT_AMBULATORY_CARE_PROVIDER_SITE_OTHER): Payer: 59 | Admitting: Obstetrics and Gynecology

## 2016-02-04 ENCOUNTER — Encounter: Payer: Self-pay | Admitting: Obstetrics and Gynecology

## 2016-02-04 VITALS — BP 150/90 | Ht 65.0 in | Wt 268.5 lb

## 2016-02-04 DIAGNOSIS — O165 Unspecified maternal hypertension, complicating the puerperium: Secondary | ICD-10-CM

## 2016-02-04 NOTE — Progress Notes (Signed)
Patient ID: ELVIA AYDIN, female   DOB: [birth year], 26 y.o.   MRN: [id number]   [facility] Visit  Patient name: Patricia [name] MRN [id number]  Date of birth: 1991/10/17  CC & HPI:  BP check  Patricia [name] is a 25 y.o. female presenting today for BP check s/p GHTN on Norvasc /d.  Pt is breastfeeding, but has seen a decrease in milk supply. She is feeding q2-3h. Pt states her baby has thrush, but she has not seen any thrush on her nipples. Pt denies HA, visual disturbance, upper abdominal pain, leg swelling.  ROS:  A complete 10 system review of systems was obtained and all systems are negative except as noted in the HPI and PMH.    Pertinent History Reviewed:   Reviewed: Significant for PCOD Medical         Past Medical History  Diagnosis Date  . Polycystic disease, ovaries   . Migraine   . Migraines   . PCO (polycystic ovaries) 03/08/2014  . Pregnant 05/27/2015  . URI (upper respiratory infection) 08/01/2015  . Cough 08/01/2015  . Nausea and vomiting during pregnancy prior to [redacted] weeks gestation 08/01/2015                              Surgical Hx:    Past Surgical History  Procedure Laterality Date  . Tonsillectomy     Medications: Reviewed & Updated - see associated section                       Current outpatient prescriptions:  .  acetaminophen (TYLENOL) 325 MG tablet, Take 2 tablets (650 mg total) by mouth every 4 (four) hours as needed (for pain scale < 4). (Patient not taking: Reported on 01/07/2016), Disp: 90 tablet, Rfl: 3 .  amLODipine (NORVASC) 5 MG tablet, Take 1 tablet (5 mg total) by mouth daily., Disp: 30 tablet, Rfl: 1 .  Cholecalciferol (VITAMIN D3) 5000 units CAPS, Take 5,000 Units by mouth daily., Disp: , Rfl:  .  Cyanocobalamin (B-12 PO), Take 1 tablet by mouth daily., Disp: , Rfl:  .  ibuprofen (ADVIL,MOTRIN) 600 MG tablet, Take 1 tablet (600 mg total) by mouth every 6 (six) hours., Disp: 90 tablet, Rfl: 3 .  Prenat-FeBis-FePro-FA-CA-Omega (COMPLETE  NATAL DHA) 29-1-200 & 250 MG MISC, Take 1 tablet by mouth daily., Disp: 30 each, Rfl: 11 .  Triamcinolone Acetonide 0.025 % LOTN, Apply small amount twice daily as needed (Patient taking differently: Apply 1 application topically daily as needed (rash/itching). ), Disp: 60 mL, Rfl: 0   Social History: Reviewed -  reports that she has never smoked. She has never used smokeless tobacco.  Objective Findings:  Vitals: Blood pressure 150/90, height  (1.651 m), weight 268 lb 8 oz (121.791 kg), currently breastfeeding.  Physical Examination: General appearance - alert, well appearing, and in no distress Mental status - alert, oriented to person, place, and time Abdomen - soft, nontender, nondistended, no masses or organomegaly Neurological - alert, oriented, normal speech, no focal findings or movement disorder noted; reflexes 1+  Musculoskeletal - no joint tenderness, deformity or swelling Extremities - peripheral pulses normal, no pedal edema, no clubbing or cyanosis;  Assessment & Plan:   A:  1. Pt s/p GHTN on Norvasc /d 2.  BP 150/90 in office  3. Weight 268 in office   P:  1. Increase dose of Norvasc  to 10mg /d     By signing my name below, I, Doreatha MartinEva Mathews, attest that this documentation has been prepared under the direction and in the presence of Tilda BurrowJohn Naeema Patlan V, MD. Electronically Signed: Doreatha MartinEva Mathews, ED Scribe. 02/04/2016. 2:30 PM.  I personally performed the services described in this documentation, which was SCRIBED in my presence. The recorded information has been reviewed and considered accurate. It has been edited as necessary during review. Tilda BurrowFERGUSON,Bostyn Bogie V, MD

## 2016-02-05 ENCOUNTER — Telehealth: Payer: Self-pay | Admitting: *Deleted

## 2016-02-05 ENCOUNTER — Telehealth (HOSPITAL_COMMUNITY): Payer: Self-pay | Admitting: Lactation Services

## 2016-02-05 NOTE — Telephone Encounter (Signed)
Bf baby on demand 8+/24hr. If baby does not latch pump 8+/24 hr. Do as much sts as you can. Made an OP atp for 02/06/16 at 100.

## 2016-02-05 NOTE — Telephone Encounter (Signed)
Latching and pumping for her 405 wk old son. She offers the breast at every feeding but sometimes he will not latch, so she will pump. She is using a Medela DEBP. In the last 24 hr baby has latched 1 time, she has pumped 5 times. She has gone from pumping 7oz per breast to 1oz per breast.  Low pressure try to latch baby 8+/24hr, if he does not latch then pump 8+/24hr. Lots of sts, relax with baby. Come to OP atp 02/06/16 at 1300.

## 2016-02-06 ENCOUNTER — Ambulatory Visit (HOSPITAL_COMMUNITY)
Admission: RE | Admit: 2016-02-06 | Discharge: 2016-02-06 | Disposition: A | Discharge status: Home / Self Care | Payer: 59 | Source: Ambulatory Visit | Attending: Obstetrics and Gynecology | Admitting: Obstetrics and Gynecology

## 2016-02-06 MED ORDER — AMLODIPINE BESYLATE 5 MG PO TABS: 5.0000 mg | ORAL_TABLET | Freq: Every day | ORAL | Status: DC

## 2016-02-06 NOTE — Telephone Encounter (Signed)
Dr. Emelda FearFerguson gave verbal order for refill of Norvasc. Called pt and LMOM that she needs to go by her pharmacy.

## 2016-02-06 NOTE — Lactation Note (Signed)
Lactation Consult; weight today 9- 6.4 oz 4264 g Has had a lot of bottles, Mom concerned about milk supply. Has been taking Fenugreek since Wednesday and feels it has made a difference. Encouraged to pump every time baby feeds to increase milk supply. Reviewed power pumping. No questions at present. To call prn  Mother's reason for visit:  Help with breast feeding Visit Type:  Feeding assessment Appointment Notes:  Now 555 Julieanna Geraci old and has difficulty with latch for most of the 5 Dayshaun Whobrey. Baby has thrush, has been treated with Nystatin since last week, mom reports no improvement. Suggested calling Ped about that Consult:  Initial Lactation Consultant:  Audry RilesWeeks, Olie Dibert D  ________________________________________________________________________    ________________________________________________________________________  Mother's Name: [name] Type of delivery:   Breastfeeding Experience:  P1 M ________________________________________________________________________  Breastfeeding History (Post Discharge)  Frequency of breastfeeding:  As he will Duration of feeding:  5 - 20 min  Supplementation  Formula:  Volume 0 ml  Breastmilk:  Volume 4 oz Frequency:  q 2 1/2-3 oz l  Method:  Bottle,   Pumping  Type of pump:  Medela pump in style Frequency:  4 times/day Volume:  2-6 oz  Infant Intake and Output Assessment  Voids:  QS in 24 hrs.  Color:  Clear yellow Stools:  QS in 24 hrs.  Color:  Yellow  ________________________________________________________________________  Maternal Breast Assessment  Breast:  Soft Nipple:  Erect  _______________________________________________________________________ Feeding Assessment/Evaluation  Initial feeding assessment:  Positioning:  Cradle Right breast  Tahmeir was very fussy at the breast. Would not latch to bare breast. Mom reports he has latched a few times through the night. Used #24 NS and he did better but got fussy after a  time. Bottle fed to calm him then he latched again for maybe 5 min. Getting fussy at the breast. Mom fed him rest of bottle  Tools:  Nipple shield 24 mm Instructed on use and cleaning of tool:  Yes.    Pre-feed weight:  4264 g  9- 6.4 oz Post-feed weight:  Did not reweigh because he had had bottle of EBM,.Very little EBM noted in NS Amount supplemented: 4 oz EBM    Total amount pumped post feed:  R 2 oz   L 2 oz  Total supplement given:  4 oz

## 2016-02-08 NOTE — Telephone Encounter (Signed)
Done 3/17. 

## 2016-02-10 ENCOUNTER — Telehealth: Payer: Self-pay | Admitting: Obstetrics & Gynecology

## 2016-02-10 NOTE — Telephone Encounter (Signed)
Pt requesting a return to work note for 02/13/3016, delivered 01/02/2016 SVD, Postpartum visit 02/18/2016.

## 2016-02-11 NOTE — Telephone Encounter (Signed)
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

## 2016-02-18 ENCOUNTER — Encounter: Payer: Self-pay | Admitting: Obstetrics & Gynecology

## 2016-02-18 ENCOUNTER — Ambulatory Visit (INDEPENDENT_AMBULATORY_CARE_PROVIDER_SITE_OTHER): Payer: 59 | Admitting: Obstetrics & Gynecology

## 2016-02-18 MED ORDER — NORETHINDRONE 0.35 MG PO TABS: 1.0000 | ORAL_TABLET | Freq: Every day | ORAL | Status: DC

## 2016-02-18 NOTE — Progress Notes (Signed)
Patient ID: Patricia [name], female   DOB: 05/08/1991, 25 y.o.   MRN: [id number] . Subjective:     Patricia [name] is a 25 y.o. female who presents for a postpartum visit. She is 6 weeks postpartum following a spontaneous vaginal delivery. I have fully reviewed the prenatal and intrapartum course. The delivery was at 37 gestational weeks. Outcome: spontaneous vaginal delivery. Anesthesia: epidural. Postpartum course has been unremarkable. Baby's course has been unremarkable. Baby is feeding by breast. Bleeding no bleeding. Bowel function is normal. Bladder function is normal. Patient is sexually active. Contraception method is none. Postpartum depression screening: negative.  The following portions of the patient's history were reviewed and updated as appropriate: current medications, past family history, past medical history, past social history, past surgical history and problem list.  Review of Systems Pertinent items are noted in HPI.   Objective:    BP 130/80 mmHg  Pulse 76  Wt 270 lb (122.471 kg)  LMP 01/21/2016  General:  alert, cooperative and no distress   Breasts:    Lungs:   Heart:    Abdomen: Soft normal   Vulva:  normal  Vagina: normal vagina, no discharge, exudate, lesion, or erythema  Cervix:  no lesions  Corpus: normal size, contour, position, consistency, mobility, non-tender  Adnexa:  normal adnexa  Rectal Exam:         Assessment:     normal postpartum exam. Pap smear not done at today's visit.   Plan:    1. Contraception: oral progesterone-only contraceptive 2.  3. Follow up in: 6 months or as needed.

## 2016-06-29 ENCOUNTER — Ambulatory Visit (INDEPENDENT_AMBULATORY_CARE_PROVIDER_SITE_OTHER): Payer: 59 | Admitting: Obstetrics & Gynecology

## 2016-06-29 ENCOUNTER — Encounter: Payer: Self-pay | Admitting: Obstetrics & Gynecology

## 2016-06-29 ENCOUNTER — Other Ambulatory Visit (HOSPITAL_COMMUNITY)
Admission: RE | Admit: 2016-06-29 | Discharge: 2016-06-29 | Disposition: A | Discharge status: Home / Self Care | Payer: 59 | Source: Ambulatory Visit | Attending: Obstetrics & Gynecology | Admitting: Obstetrics & Gynecology

## 2016-06-29 VITALS — BP 144/78 | HR 68 | Ht 64.0 in | Wt 281.0 lb

## 2016-06-29 DIAGNOSIS — Z01411 Encounter for gynecological examination (general) (routine) with abnormal findings: Secondary | ICD-10-CM | POA: Diagnosis not present

## 2016-06-29 DIAGNOSIS — Z01419 Encounter for gynecological examination (general) (routine) without abnormal findings: Secondary | ICD-10-CM | POA: Diagnosis not present

## 2016-06-29 DIAGNOSIS — Z113 Encounter for screening for infections with a predominantly sexual mode of transmission: Secondary | ICD-10-CM | POA: Insufficient documentation

## 2016-06-29 DIAGNOSIS — Z7251 High risk heterosexual behavior: Secondary | ICD-10-CM

## 2016-06-29 MED ORDER — NORETHINDRONE 0.35 MG PO TABS: 1.0000 | ORAL_TABLET | Freq: Every day | ORAL | 11 refills | Status: DC

## 2016-06-29 NOTE — Progress Notes (Signed)
Subjective:     Patricia [name] is a 25 y.o. female here for a routine exam.  Patient's last menstrual period was 05/24/2016. [id number] Birth Control Method:  micronor Menstrual Calendar(currently): regular  Current complaints: none wants std check.   Current acute medical issues:  none   Recent Gynecologic History Patient's last menstrual period was 05/24/2016. Last Pap: 2015,  normal Last mammogram: ,    Past Medical History:  Diagnosis Date  . Cough 08/01/2015  . Migraine   . Migraines   . Nausea and vomiting during pregnancy prior to [redacted] weeks gestation 08/01/2015  . PCO (polycystic ovaries) 03/08/2014  . Polycystic disease, ovaries   . Pregnant 05/27/2015  . URI (upper respiratory infection) 08/01/2015    Past Surgical History:  Procedure Laterality Date  . TONSILLECTOMY      OB History    Gravida Para Term Preterm AB Living   SAB TAB Ectopic Multiple Live Births   1     0 1      Social History   Social History  . Marital status: Single    Spouse name: N/A  . Number of children: N/A  . Years of education: N/A   Social History Main Topics  . Smoking status: Never Smoker  . Smokeless tobacco: Never Used  . Alcohol use No     Comment: occassional  . Drug use: No  . Sexual activity: Yes    Birth control/ protection: Pill   Other Topics Concern  . None   Social History Narrative  . None    Family History  Problem Relation Age of Onset  . Migraines Sister   . Hypertension Brother   . Heart disease Maternal Grandmother   . Cancer Maternal Grandfather     prostate  . Stroke Paternal Grandmother   . Cancer Paternal Grandfather     prostate  . Stroke Paternal Grandfather      Current Outpatient Prescriptions:  .  norethindrone (MICRONOR,CAMILA,ERRIN) 0.35 MG tablet, Take 1 tablet (0.35 mg total) by mouth daily., Disp: 1 Package, Rfl: 11 .  amLODipine (NORVASC) 5 MG tablet, Take 1 tablet (5 mg total) by mouth daily. (Patient not taking:  Reported on 06/29/2016), Disp: 30 tablet, Rfl: 3 .  Fenugreek 610 MG CAPS, Take by mouth., Disp: , Rfl:  .  Prenat-FeBis-FePro-FA-CA-Omega (COMPLETE NATAL DHA) 29-1-200 & 250 MG MISC, Take 1 tablet by mouth daily. (Patient not taking: Reported on 06/29/2016), Disp: 30 each, Rfl: 11  Review of Systems  Review of Systems  Constitutional: Negative for fever, chills, weight loss, malaise/fatigue and diaphoresis.  HENT: Negative for hearing loss, ear pain, nosebleeds, congestion, sore throat, neck pain, tinnitus and ear discharge.   Eyes: Negative for blurred vision, double vision, photophobia, pain, discharge and redness.  Respiratory: Negative for cough, hemoptysis, sputum production, shortness of breath, wheezing and stridor.   Cardiovascular: Negative for chest pain, palpitations, orthopnea, claudication, leg swelling and PND.  Gastrointestinal: negative for abdominal pain. Negative for heartburn, nausea, vomiting, diarrhea, constipation, blood in stool and melena.  Genitourinary: Negative for dysuria, urgency, frequency, hematuria and flank pain.  Musculoskeletal: Negative for myalgias, back pain, joint pain and falls.  Skin: Negative for itching and rash.  Neurological: Negative for dizziness, tingling, tremors, sensory change, speech change, focal weakness, seizures, loss of consciousness, weakness and headaches.  Endo/Heme/Allergies: Negative for environmental allergies and polydipsia. Does not bruise/bleed easily.  Psychiatric/Behavioral: Negative for depression, suicidal ideas, hallucinations,  memory loss and substance abuse. The patient is not nervous/anxious and does not have insomnia.        Objective:  Blood pressure (!) 144/78, pulse 68, height 5\' 4"  (1.626 m), weight 281 lb (127.5 kg), last menstrual period 05/24/2016, currently breastfeeding.   Physical Exam  Vitals reviewed. Constitutional: She is oriented to person, place, and time. She appears well-developed and well-nourished.   HENT:  Head: Normocephalic and atraumatic.        Right Ear: External ear normal.  Left Ear: External ear normal.  Nose: Nose normal.  Mouth/Throat: Oropharynx is clear and moist.  Eyes: Conjunctivae and EOM are normal. Pupils are equal, round, and reactive to light. Right eye exhibits no discharge. Left eye exhibits no discharge. No scleral icterus.  Neck: Normal range of motion. Neck supple. No tracheal deviation present. No thyromegaly present.  Cardiovascular: Normal rate, regular rhythm, normal heart sounds and intact distal pulses.  Exam reveals no gallop and no friction rub.   No murmur heard. Respiratory: Effort normal and breath sounds normal. No respiratory distress. She has no wheezes. She has no rales. She exhibits no tenderness.  GI: Soft. Bowel sounds are normal. She exhibits no distension and no mass. There is no tenderness. There is no rebound and no guarding.  Genitourinary:  Breasts no masses skin changes or nipple changes bilaterally      Vulva is normal without lesions Vagina is pink moist without discharge Cervix normal in appearance and pap is done Uterus is normal size shape and contour Adnexa is negative with normal sized ovaries   Musculoskeletal: Normal range of motion. She exhibits no edema and no tenderness.  Neurological: She is alert and oriented to person, place, and time. She has normal reflexes. She displays normal reflexes. No cranial nerve deficit. She exhibits normal muscle tone. Coordination normal.  Skin: Skin is warm and dry. No rash noted. No erythema. No pallor.  Psychiatric: She has a normal mood and affect. Her behavior is normal. Judgment and thought content normal.       Medications Ordered at today's visit: Meds ordered this encounter  Medications  . norethindrone (MICRONOR,CAMILA,ERRIN) 0.35 MG tablet    Sig: Take 1 tablet (0.35 mg total) by mouth daily.    Dispense:  1 Package    Refill:  11    Other orders placed at today's  visit: Orders Placed This Encounter  Procedures  . RPR  . HIV antibody  . Hepatitis C Antibody      Assessment:    Healthy female exam.    Plan:    Contraception: oral progesterone-only contraceptive. Follow up in: 1 year. HIV RPR HEP C GC Chlamydia     Return in about 1 year (around 06/29/2017) for yearly, with Dr Despina HiddenEure.

## 2016-07-02 LAB — CYTOLOGY - PAP

## 2016-07-07 ENCOUNTER — Emergency Department (HOSPITAL_COMMUNITY): Payer: 59

## 2016-07-07 ENCOUNTER — Encounter (HOSPITAL_COMMUNITY): Payer: Self-pay | Admitting: Emergency Medicine

## 2016-07-07 ENCOUNTER — Emergency Department (HOSPITAL_COMMUNITY)
Admission: EM | Admit: 2016-07-07 | Discharge: 2016-07-07 | Disposition: A | Discharge status: Home / Self Care | Payer: 59 | Attending: Emergency Medicine | Admitting: Emergency Medicine

## 2016-07-07 DIAGNOSIS — Z791 Long term (current) use of non-steroidal anti-inflammatories (NSAID): Secondary | ICD-10-CM | POA: Insufficient documentation

## 2016-07-07 DIAGNOSIS — R079 Chest pain, unspecified: Secondary | ICD-10-CM | POA: Insufficient documentation

## 2016-07-07 DIAGNOSIS — Z79899 Other long term (current) drug therapy: Secondary | ICD-10-CM | POA: Diagnosis not present

## 2016-07-07 NOTE — ED Triage Notes (Signed)
Pt c/o left sided chest pain since yesterday

## 2016-07-07 NOTE — Discharge Instructions (Signed)

## 2016-07-07 NOTE — ED Provider Notes (Signed)
[facility]EM[facility]ENCY DEPT Provider Note   CSN: [id number] Arrival date & time: 07/07/16  1850     History   Chief Complaint Chief Complaint  Patient presents with  . Chest Pain    HPI Patricia [name] is a 25 y.o. female.  The history is provided by the patient.  Chest Pain   This is a new problem. The current episode started yesterday. The problem has been gradually worsening. Pain location: left chest  The pain is mild. The quality of the pain is described as brief (squeezing). The pain does not radiate. Duration of episode(s) is 2 minutes. Pertinent negatives include no cough, no diaphoresis, no fever, no irregular heartbeat, no lower extremity edema, no shortness of breath, no syncope and no weakness. She has tried nothing for the symptoms. Risk factors include oral contraceptive use.  Pertinent negatives for past medical history include no CAD, no DVT and no PE. Past medical history comments: not pregnant currently  patient reports left sided CP for past day It lasts 2 minutes then resolve No fever/vomiting/diaphoresis/sob She has no active pain at this time  Past Medical History:  Diagnosis Date  . Cough 08/01/2015  . Migraine   . Migraines   . Nausea and vomiting during pregnancy prior to [redacted] weeks gestation 08/01/2015  . PCO (polycystic ovaries) 03/08/2014  . Polycystic disease, ovaries   . Pregnant 05/27/2015  . URI (upper respiratory infection) 08/01/2015    Patient Active Problem List   Diagnosis Date Noted  . Hypertension, postpartum condition or complication 02/04/2016  . Gestational hypertension w/o significant proteinuria in 3rd trimester 12/30/2015  . URI (upper respiratory infection) 08/01/2015  . Cough 08/01/2015  . Morbid obesity (HCC) 09/23/2014  . Dizziness and giddiness 09/23/2014  . Headache 09/23/2014  . PCO (polycystic ovaries) 03/08/2014    Past Surgical History:  Procedure Laterality Date  . TONSILLECTOMY      OB History    Gravida Para Term Preterm  AB Living   2 1 1   1 1    SAB TAB Ectopic Multiple Live Births   1     0 1       Home Medications    Prior to Admission medications   Medication Sig Start Date End Date Taking? Authorizing Provider  caffeine 200 MG TABS tablet Take 200-400 mg by mouth daily.   Yes Historical Provider, MD  ibuprofen (ADVIL,MOTRIN) 200 MG tablet Take 200 mg by mouth every 6 (six) hours as needed for mild pain or moderate pain.   Yes Historical Provider, MD  norethindrone (MICRONOR,CAMILA,ERRIN) 0.35 MG tablet Take 1 tablet (0.35 mg total) by mouth daily. 06/29/16  Yes Lazaro ArmsLuther H Eure, MD  Prenat-FeBis-FePro-FA-CA-Omega (COMPLETE NATAL DHA) 29-1-200 & 250 MG MISC Take 1 tablet by mouth daily. Patient not taking: Reported on 06/29/2016 06/11/15   Jacklyn ShellFrances Cresenzo-Dishmon, CNM    Family History Family History  Problem Relation Age of Onset  . Migraines Sister   . Hypertension Brother   . Heart disease Maternal Grandmother   . Cancer Maternal Grandfather     prostate  . Stroke Paternal Grandmother   . Cancer Paternal Grandfather     prostate  . Stroke Paternal Grandfather     Social History Social History  Substance Use Topics  . Smoking status: Never Smoker  . Smokeless tobacco: Never Used  . Alcohol use No     Comment: occassional     Allergies   Peanut-containing drug products; Propranolol; Cephalexin; Imitrex [sumatriptan]; and Cephalexin  Review of Systems Review of Systems  Constitutional: Negative for diaphoresis and fever.  Respiratory: Negative for cough and shortness of breath.   Cardiovascular: Positive for chest pain. Negative for syncope.  Neurological: Negative for syncope and weakness.  All other systems reviewed and are negative.    Physical Exam Updated Vital Signs BP 135/66   Pulse 65   Temp 98.1 F (36.7 C)   Resp 18   Ht 5\' 4"  (1.626 m)   Wt 124.7 kg   LMP 07/04/2016   SpO2 100%   BMI 47.20 kg/m   Physical Exam CONSTITUTIONAL: Well developed/well  nourished HEAD: Normocephalic/atraumatic EYES: EOMI/PERRL ENMT: Mucous membranes moist NECK: supple no meningeal signs SPINE/BACK:entire spine nontender CV: S1/S2 noted, no murmurs/rubs/gallops noted LUNGS: Lungs are clear to auscultation bilaterally, no apparent distress ABDOMEN: soft, nontender GU:no cva tenderness NEURO: Pt is awake/alert/appropriate, moves all extremitiesx4.  No facial droop.   EXTREMITIES: pulses normal/equal, full ROM, no calf tenderness or edema noted SKIN: warm, color normal PSYCH: no abnormalities of mood noted, alert and oriented to situation   ED Treatments / Results  Labs (all labs ordered are listed, but only abnormal results are displayed) Labs Reviewed - No data to display  EKG  EKG Interpretation  Date/Time:  Wednesday July 07 2016 19:00:46 EDT Ventricular Rate:  65 PR Interval:    QRS Duration: 96 QT Interval:  425 QTC Calculation: 442 R Axis:   68 Text Interpretation:  Sinus rhythm Borderline prolonged PR interval No previous ECGs available Confirmed by Bebe ShaggyWICKLINE  MD, Annalee Meyerhoff ([id number]) on 07/07/2016 7:07:42 PM       Radiology Dg Chest 2 View  Result Date: 07/07/2016 CLINICAL DATA:  Left-sided chest pain since yesterday. EXAM: CHEST  2 VIEW COMPARISON:  None. FINDINGS: Heart size is normal. Mediastinal shadows are normal. The lungs are clear. No bronchial thickening. No infiltrate, mass, effusion or collapse. Pulmonary vascularity is normal. No bony abnormality. IMPRESSION: Normal chest Electronically Signed   By: Paulina FusiMark  Shogry M.D.   On: 07/07/2016 19:47    Procedures Procedures (including critical care time)  Medications Ordered in ED Medications - No data to display   Initial Impression / Assessment and Plan / ED Course  I have reviewed the triage vital signs and the nursing notes.  Pertinent  imaging results that were available during my care of the patient were reviewed by me and considered in my medical decision making (see chart  for details).  Clinical Course    Pt well appearing CXR/EKG unremarkable No active CP at this time I doubt ACS/PE/Dissection at this time She is well appearing/using phone, no distress We discussed strict return precautions   Final Clinical Impressions(s) / ED Diagnoses   Final diagnoses:  Chest pain, unspecified chest pain type    New Prescriptions New Prescriptions   No medications on file     Zadie Rhineonald Trelyn Vanderlinde, MD 07/07/16 2005

## 2016-07-15 ENCOUNTER — Telehealth: Payer: Self-pay | Admitting: *Deleted

## 2016-07-15 ENCOUNTER — Other Ambulatory Visit: Payer: Self-pay | Admitting: *Deleted

## 2016-07-15 MED ORDER — OMEPRAZOLE 20 MG PO CPDR: 20.0000 mg | DELAYED_RELEASE_CAPSULE | Freq: Every day | ORAL | 3 refills | Status: DC

## 2016-07-15 MED ORDER — OMEPRAZOLE 20 MG PO CPDR: 20.0000 mg | DELAYED_RELEASE_CAPSULE | Freq: Every day | ORAL | 11 refills | Status: DC

## 2016-07-15 MED ORDER — NORETHINDRONE 0.35 MG PO TABS: 1.0000 | ORAL_TABLET | Freq: Every day | ORAL | 3 refills | Status: DC

## 2016-07-15 NOTE — Telephone Encounter (Signed)
[facility] and needs to be sent to [facility].

## 2016-07-22 LAB — RPR: RPR: NONREACTIVE

## 2016-07-22 LAB — HEPATITIS C ANTIBODY

## 2016-07-22 LAB — HIV ANTIBODY (ROUTINE TESTING W REFLEX): HIV SCREEN 4TH GENERATION: NONREACTIVE

## 2016-08-23 ENCOUNTER — Telehealth: Payer: Self-pay | Admitting: Obstetrics & Gynecology

## 2016-08-23 NOTE — Telephone Encounter (Signed)
Pt states quit breastfeeding x 4 mos ago and is still having milk production, cold cabbage leafs and support bars are not helping. Pt given an appt with Cyril MourningJennifer Griffin, NP for evaluation.

## 2016-08-27 ENCOUNTER — Ambulatory Visit (INDEPENDENT_AMBULATORY_CARE_PROVIDER_SITE_OTHER): Payer: 59 | Admitting: Adult Health

## 2016-08-27 ENCOUNTER — Encounter: Payer: Self-pay | Admitting: Adult Health

## 2016-08-27 ENCOUNTER — Telehealth: Payer: Self-pay | Admitting: Adult Health

## 2016-08-27 VITALS — BP 120/80 | HR 76 | Ht 65.0 in | Wt 285.5 lb

## 2016-08-27 DIAGNOSIS — Z3201 Encounter for pregnancy test, result positive: Secondary | ICD-10-CM | POA: Diagnosis not present

## 2016-08-27 DIAGNOSIS — Z3490 Encounter for supervision of normal pregnancy, unspecified, unspecified trimester: Secondary | ICD-10-CM

## 2016-08-27 DIAGNOSIS — N926 Irregular menstruation, unspecified: Secondary | ICD-10-CM

## 2016-08-27 DIAGNOSIS — O3680X0 Pregnancy with inconclusive fetal viability, not applicable or unspecified: Secondary | ICD-10-CM

## 2016-08-27 LAB — POCT URINE PREGNANCY: PREG TEST UR: POSITIVE — AB

## 2016-08-27 MED ORDER — COMPLETE NATAL DHA 29-1-200 & 250 MG PO MISC: ORAL | 11 refills | Status: DC

## 2016-08-27 MED ORDER — ONDANSETRON HCL 4 MG PO TABS: 4.0000 mg | ORAL_TABLET | Freq: Three times a day (TID) | ORAL | 2 refills | Status: DC | PRN

## 2016-08-27 NOTE — Progress Notes (Addendum)
Subjective:     Patient ID: Patricia [name], female   DOB: [birth year], 21[name]25 y.o.   MRN: [id number]  HPI Patricia [name] is a 25 year old black female in for UPT, has missed a period, and still has breast milk.Her son is 428 months old and stopped breast pumping about 4 months ago.  Review of Systems Patient denies any headaches, hearing loss, fatigue, blurred vision, shortness of breath, chest pain, abdominal pain, problems with bowel movements, urination, or intercourse. No joint pain or mood swings.+ breast milk,+missed period Reviewed past medical,surgical, social and family history. Reviewed medications and allergies.     Objective:   Physical Exam BP 120/80 (BP Location: Left Arm, Patient Position: Sitting, Cuff Size: Large)   Pulse 76   Ht 5\' 5"  (1.651 m)   Wt 285 lb 8 oz (129.5 kg)   LMP 07/22/2016 (Exact Date)   Breastfeeding? No   BMI 47.51 kg/m UPT +, about 5+ 1 week by LMP with EDD 04/28/17.   Skin warm and dry. Neck: mid line trachea, normal thyroid, good ROM, no lymphadenopathy noted. Lungs: clear to ausculation bilaterally. Cardiovascular: regular rate and rhythm.   Abdomen is soft and non tender.  Breasts:no dominate palpable mass, retraction, has white milky discharge from both nipples, looks like breast milk.not uncommon to still have some milk in brest.  Assessment:     1. Pregnancy examination or test, positive result   2. Pregnancy, unspecified gestational age   26. Pregnancy with inconclusive fetal viability, not applicable or unspecified fetus       Plan:     Rx complete natal DHA #60 take daily with 11 refills Return in 2 weeks for dating US Review handout on first trimester      She called back after visit: She forgot to tell me about nausea and requests zofran and that she got pregnant when on OCs but stopped them.Rx zofran 4 mg #20 with 2 refills.

## 2016-08-27 NOTE — Patient Instructions (Signed)
First Trimester of Pregnancy The first trimester of pregnancy is from week 1 until the end of week 12 (months 1 through 3). A week after a sperm fertilizes an egg, the egg will implant on the wall of the uterus. This embryo will begin to develop into a baby. Genes from you and your partner are forming the baby. The female genes determine whether the baby is a boy or a girl. At 6-8 weeks, the eyes and face are formed, and the heartbeat can be seen on ultrasound. At the end of 12 weeks, all the baby's organs are formed.  Now that you are pregnant, you will want to do everything you can to have a healthy baby. Two of the most important things are to get good prenatal care and to follow your health care provider's instructions. Prenatal care is all the medical care you receive before the baby's birth. This care will help prevent, find, and treat any problems during the pregnancy and childbirth. BODY CHANGES Your body goes through many changes during pregnancy. The changes vary from woman to woman.   You may gain or lose a couple of pounds at first.  You may feel sick to your stomach (nauseous) and throw up (vomit). If the vomiting is uncontrollable, call your health care provider.  You may tire easily.  You may develop headaches that can be relieved by medicines approved by your health care provider.  You may urinate more often. Painful urination may mean you have a bladder infection.  You may develop heartburn as a result of your pregnancy.  You may develop constipation because certain hormones are causing the muscles that push waste through your intestines to slow down.  You may develop hemorrhoids or swollen, bulging veins (varicose veins).  Your breasts may begin to grow larger and become tender. Your nipples may stick out more, and the tissue that surrounds them (areola) may become darker.  Your gums may bleed and may be sensitive to brushing and flossing.  Dark spots or blotches (chloasma,  mask of pregnancy) may develop on your face. This will likely fade after the baby is born.  Your menstrual periods will stop.  You may have a loss of appetite.  You may develop cravings for certain kinds of food.  You may have changes in your emotions from day to day, such as being excited to be pregnant or being concerned that something may go wrong with the pregnancy and baby.  You may have more vivid and strange dreams.  You may have changes in your hair. These can include thickening of your hair, rapid growth, and changes in texture. Some women also have hair loss during or after pregnancy, or hair that feels dry or thin. Your hair will most likely return to normal after your baby is born. WHAT TO EXPECT AT YOUR PRENATAL VISITS During a routine prenatal visit:  You will be weighed to make sure you and the baby are growing normally.  Your blood pressure will be taken.  Your abdomen will be measured to track your baby's growth.  The fetal heartbeat will be listened to starting around week 10 or 12 of your pregnancy.  Test results from any previous visits will be discussed. Your health care provider may ask you:  How you are feeling.  If you are feeling the baby move.  If you have had any abnormal symptoms, such as leaking fluid, bleeding, severe headaches, or abdominal cramping.  If you are using any tobacco products,   including cigarettes, chewing tobacco, and electronic cigarettes.  If you have any questions. Other tests that may be performed during your first trimester include:  Blood tests to find your blood type and to check for the presence of any previous infections. They will also be used to check for low iron levels (anemia) and Rh antibodies. Later in the pregnancy, blood tests for diabetes will be done along with other tests if problems develop.  Urine tests to check for infections, diabetes, or protein in the urine.  An ultrasound to confirm the proper growth  and development of the baby.  An amniocentesis to check for possible genetic problems.  Fetal screens for spina bifida and Down syndrome.  You may need other tests to make sure you and the baby are doing well.  HIV (human immunodeficiency virus) testing. Routine prenatal testing includes screening for HIV, unless you choose not to have this test. HOME CARE INSTRUCTIONS  Medicines  Follow your health care provider's instructions regarding medicine use. Specific medicines may be either safe or unsafe to take during pregnancy.  Take your prenatal vitamins as directed.  If you develop constipation, try taking a stool softener if your health care provider approves. Diet  Eat regular, well-balanced meals. Choose a variety of foods, such as meat or vegetable-based protein, fish, milk and low-fat dairy products, vegetables, fruits, and whole grain breads and cereals. Your health care provider will help you determine the amount of weight gain that is right for you.  Avoid raw meat and uncooked cheese. These carry germs that can cause birth defects in the baby.  Eating four or five small meals rather than three large meals a day may help relieve nausea and vomiting. If you start to feel nauseous, eating a few soda crackers can be helpful. Drinking liquids between meals instead of during meals also seems to help nausea and vomiting.  If you develop constipation, eat more high-fiber foods, such as fresh vegetables or fruit and whole grains. Drink enough fluids to keep your urine clear or pale yellow. Activity and Exercise  Exercise only as directed by your health care provider. Exercising will help you:  Control your weight.  Stay in shape.  Be prepared for labor and delivery.  Experiencing pain or cramping in the lower abdomen or low back is a good sign that you should stop exercising. Check with your health care provider before continuing normal exercises.  Try to avoid standing for long  periods of time. Move your legs often if you must stand in one place for a long time.  Avoid heavy lifting.  Wear low-heeled shoes, and practice good posture.  You may continue to have sex unless your health care provider directs you otherwise. Relief of Pain or Discomfort  Wear a good support bra for breast tenderness.   Take warm sitz baths to soothe any pain or discomfort caused by hemorrhoids. Use hemorrhoid cream if your health care provider approves.   Rest with your legs elevated if you have leg cramps or low back pain.  If you develop varicose veins in your legs, wear support hose. Elevate your feet for 15 minutes, 3-4 times a day. Limit salt in your diet. Prenatal Care  Schedule your prenatal visits by the twelfth week of pregnancy. They are usually scheduled monthly at first, then more often in the last 2 months before delivery.  Write down your questions. Take them to your prenatal visits.  Keep all your prenatal visits as directed by your   health care provider. Safety  Wear your seat belt at all times when driving.  Make a list of emergency phone numbers, including numbers for family, friends, the hospital, and police and fire departments. General Tips  Ask your health care provider for a referral to a local prenatal education class. Begin classes no later than at the beginning of month 6 of your pregnancy.  Ask for help if you have counseling or nutritional needs during pregnancy. Your health care provider can offer advice or refer you to specialists for help with various needs.  Do not use hot tubs, steam rooms, or saunas.  Do not douche or use tampons or scented sanitary pads.  Do not cross your legs for long periods of time.  Avoid cat litter boxes and soil used by cats. These carry germs that can cause birth defects in the baby and possibly loss of the fetus by miscarriage or stillbirth.  Avoid all smoking, herbs, alcohol, and medicines not prescribed by  your health care provider. Chemicals in these affect the formation and growth of the baby.  Do not use any tobacco products, including cigarettes, chewing tobacco, and electronic cigarettes. If you need help quitting, ask your health care provider. You may receive counseling support and other resources to help you quit.  Schedule a dentist appointment. At home, brush your teeth with a soft toothbrush and be gentle when you floss. SEEK MEDICAL CARE IF:   You have dizziness.  You have mild pelvic cramps, pelvic pressure, or nagging pain in the abdominal area.  You have persistent nausea, vomiting, or diarrhea.  You have a bad smelling vaginal discharge.  You have pain with urination.  You notice increased swelling in your face, hands, legs, or ankles. SEEK IMMEDIATE MEDICAL CARE IF:   You have a fever.  You are leaking fluid from your vagina.  You have spotting or bleeding from your vagina.  You have severe abdominal cramping or pain.  You have rapid weight gain or loss.  You vomit blood or material that looks like coffee grounds.  You are exposed to German measles and have never had them.  You are exposed to fifth disease or chickenpox.  You develop a severe headache.  You have shortness of breath.  You have any kind of trauma, such as from a fall or a car accident.   This information is not intended to replace advice given to you by your health care provider. Make sure you discuss any questions you have with your health care provider.   Document Released: 11/02/2001 Document Revised: 11/29/2014 Document Reviewed: 09/18/2013 Elsevier Interactive Patient Education 2016 [employer]  

## 2016-08-27 NOTE — Telephone Encounter (Signed)
She forgot to tell me about nausea and requests zofran and that she got pregnant when on OCs but stopped them.

## 2016-08-31 ENCOUNTER — Telehealth: Payer: Self-pay | Admitting: Adult Health

## 2016-08-31 MED ORDER — ONDANSETRON 4 MG PO TBDP: 4.0000 mg | ORAL_TABLET | Freq: Three times a day (TID) | ORAL | 2 refills | Status: DC | PRN

## 2016-08-31 NOTE — Telephone Encounter (Signed)
Pt informed Victorino DikeJennifer did send in a RX for the disolving tablets but they require a prior authorization.  I spoke with Sullivan LoneGilbert @ [facility] and initiated the PA, he states it has to be sent over for peer review and it can take up to 78 hours.  Case # [id number]

## 2016-09-01 ENCOUNTER — Other Ambulatory Visit: Payer: Self-pay | Admitting: Adult Health

## 2016-09-01 DIAGNOSIS — Z029 Encounter for administrative examinations, unspecified: Secondary | ICD-10-CM

## 2016-09-01 MED ORDER — ONDANSETRON 4 MG PO TBDP: 4.0000 mg | ORAL_TABLET | Freq: Three times a day (TID) | ORAL | 2 refills | Status: DC | PRN

## 2016-09-09 ENCOUNTER — Ambulatory Visit (INDEPENDENT_AMBULATORY_CARE_PROVIDER_SITE_OTHER): Payer: 59

## 2016-09-09 DIAGNOSIS — O3680X0 Pregnancy with inconclusive fetal viability, not applicable or unspecified: Secondary | ICD-10-CM

## 2016-09-09 NOTE — Progress Notes (Signed)
US 8+1 wks,single IUP w/ys, pos fht 158 bpm,normal ov's bilat,crl 17.9 mm,subchorionic hemorrhage 1.9 x .4 x 1.3 cm,

## 2016-09-27 ENCOUNTER — Encounter: Payer: Self-pay | Admitting: Women's Health

## 2016-09-27 ENCOUNTER — Ambulatory Visit (INDEPENDENT_AMBULATORY_CARE_PROVIDER_SITE_OTHER): Payer: Medicaid Other | Admitting: Women's Health

## 2016-09-27 VITALS — BP 140/60 | HR 88 | Wt 285.0 lb

## 2016-09-27 DIAGNOSIS — R030 Elevated blood-pressure reading, without diagnosis of hypertension: Secondary | ICD-10-CM

## 2016-09-27 DIAGNOSIS — O09299 Supervision of pregnancy with other poor reproductive or obstetric history, unspecified trimester: Secondary | ICD-10-CM | POA: Insufficient documentation

## 2016-09-27 DIAGNOSIS — Z1389 Encounter for screening for other disorder: Secondary | ICD-10-CM

## 2016-09-27 DIAGNOSIS — Z3481 Encounter for supervision of other normal pregnancy, first trimester: Secondary | ICD-10-CM | POA: Diagnosis not present

## 2016-09-27 DIAGNOSIS — O09291 Supervision of pregnancy with other poor reproductive or obstetric history, first trimester: Secondary | ICD-10-CM

## 2016-09-27 DIAGNOSIS — Z8759 Personal history of other complications of pregnancy, childbirth and the puerperium: Secondary | ICD-10-CM | POA: Insufficient documentation

## 2016-09-27 DIAGNOSIS — Z1379 Encounter for other screening for genetic and chromosomal anomalies: Secondary | ICD-10-CM

## 2016-09-27 DIAGNOSIS — R05 Cough: Secondary | ICD-10-CM

## 2016-09-27 DIAGNOSIS — Z331 Pregnant state, incidental: Secondary | ICD-10-CM

## 2016-09-27 DIAGNOSIS — O99211 Obesity complicating pregnancy, first trimester: Secondary | ICD-10-CM

## 2016-09-27 DIAGNOSIS — Z3A11 11 weeks gestation of pregnancy: Secondary | ICD-10-CM | POA: Diagnosis not present

## 2016-09-27 DIAGNOSIS — Z0283 Encounter for blood-alcohol and blood-drug test: Secondary | ICD-10-CM

## 2016-09-27 DIAGNOSIS — R059 Cough, unspecified: Secondary | ICD-10-CM

## 2016-09-27 DIAGNOSIS — O0990 Supervision of high risk pregnancy, unspecified, unspecified trimester: Secondary | ICD-10-CM | POA: Insufficient documentation

## 2016-09-27 LAB — POCT URINALYSIS DIPSTICK
Blood, UA: NEGATIVE
GLUCOSE UA: NEGATIVE
KETONES UA: NEGATIVE
LEUKOCYTES UA: NEGATIVE
NITRITE UA: NEGATIVE
Protein, UA: NEGATIVE

## 2016-09-27 MED ORDER — DOXYLAMINE-PYRIDOXINE 10-10 MG PO TBEC: DELAYED_RELEASE_TABLET | ORAL | 6 refills | Status: DC

## 2016-09-27 MED ORDER — OMEPRAZOLE 10 MG PO CPDR: 10.0000 mg | DELAYED_RELEASE_CAPSULE | Freq: Every day | ORAL | 6 refills | Status: DC

## 2016-09-27 NOTE — Progress Notes (Signed)
Subjective:  [name] is a 25 y.o. 643P1011 African American female at 5226w5d by 8wk u/s, being seen today for her first obstetrical visit.  Her obstetrical history is significant for SAB x 1, IOL d/t GHTN, mild shoulder dystocia w/ 7lb0oz baby, pp HTN requiring norvasc 10mg  daily.  Works at [employer]-gets labs free, wants informaseq w/ XY analysis. Pregnancy history fully reviewed.  Patient reports cough x 1 yr- started during 3rd trimester of last pregnancy, has never went away. Happens on daily basis when she has been up walking/any activity then sits or lays down- has dry nonproductive cough. Denies cp, sob, swelling of extremities. Is not currently on bp meds. Reports zofran. Denies vb, cramping, uti s/s, abnormal/malodorous vag d/c, or vulvovaginal itching/irritation.  BP 140/60   Pulse 88   Wt 285 lb (129.3 kg)   LMP 07/22/2016 (Exact Date)   BMI 47.43 kg/m   HISTORY: OB History  Gravida Para Term Preterm AB Living  3 1 1   1 1   SAB TAB Ectopic Multiple Live Births  1     0 1    # Outcome Date GA Lbr Len/2nd Weight Sex Delivery Anes PTL Lv  3 Current           2 Term 01/02/16 6028w5d 31:10 7 lb (3.175 kg) M Vag-Spont EPI N LIV  1 SAB              Past Medical History:  Diagnosis Date  . Cough 08/01/2015  . Migraine   . Migraines   . Nausea and vomiting during pregnancy prior to [redacted] weeks gestation 08/01/2015  . PCO (polycystic ovaries) 03/08/2014  . Polycystic disease, ovaries   . Pregnant 05/27/2015  . URI (upper respiratory infection) 08/01/2015   Past Surgical History:  Procedure Laterality Date  . TONSILLECTOMY    . WISDOM TOOTH EXTRACTION     Family History  Problem Relation Age of Onset  . Migraines Sister   . Hypertension Brother   . Heart disease Maternal Grandmother   . Cancer Maternal Grandfather     prostate  . Stroke Paternal Grandmother   . Cancer Paternal Grandfather     prostate  . Stroke Paternal Grandfather     Exam   System:     General: Well  developed & nourished, no acute distress   Skin: Warm & dry, normal coloration and turgor, no rashes   Neurologic: Alert & oriented, normal mood   Cardiovascular: Regular rate & rhythm   Respiratory: Effort & rate normal, LCTAB, acyanotic   Abdomen: Soft, non tender   Extremities: normal strength, tone, no edema  Thin prep pap smear neg Aug 2017 high risk HPV cotesting FHR: 160 via doppler   Assessment:   Pregnancy: G3P1011 Patient Active Problem List   Diagnosis Date Noted  . Hypertension, postpartum condition or complication 02/04/2016  . Gestational hypertension w/o significant proteinuria in 3rd trimester 12/30/2015  . URI (upper respiratory infection) 08/01/2015  . Cough 08/01/2015  . Morbid obesity (HCC) 09/23/2014  . Dizziness and giddiness 09/23/2014  . Headache 09/23/2014  . PCO (polycystic ovaries) 03/08/2014    4026w5d G3P1011 New OB visit Elevated bp, possibly CHTN, will monitory H/O GHTN w/ pp HTN H/o mild shoulder dystocia Cough w/ exertion x 7051yr Obesity, pre-gravid BMI 48.3  Plan:  Initial labs drawn Continue prenatal vitamins Problem list reviewed and updated Reviewed n/v relief measures and warning s/s to report Reviewed recommended weight gain based on pre-gravid BMI Encouraged well-balanced  diet Genetic Screening discussed: works at [employer], gets all labs free, requests Informaseq w/ XY analysis- discussed w/ LHE, get informaseq at 12wks, then AFP at 16wks (d/t informaseq not checking for NTD) Cystic fibrosis screening discussed declined Ultrasound discussed; fetal survey: requested Follow up in 2 weeks for Informaseq w/ XY analysis and visit/bp check Discussed cough w/ LHE, will get echocardiogram- scheduled for 11/16 @ 8:30, baby ASA at 12wks, prilosec daily CCNC completed Monitor bp  Marge DuncansBooker, Carmie Lanpher Randall CNM, [facility]WHNP-BC 09/27/2016 5:14 PM

## 2016-09-27 NOTE — Patient Instructions (Addendum)
Begin taking a 81mg  baby aspirin daily at 12 weeks of pregnancy (11/15) to decrease risk of preeclampsia during pregnancy   Begin taking omeprazole daily (even if you feel like you don't have reflux)  [facility] Th[facility]/16 @ 8:30am, be there at 8:15am to register, at main entrance of hospital   Nausea & Vomiting  Have saltine crackers or pretzels by your bed and eat a few bites before you raise your head out of bed in the morning  Eat small frequent meals throughout the day instead of large meals  Drink plenty of fluids throughout the day to stay hydrated, just don't drink a lot of fluids with your meals.  This can make your stomach fill up faster making you feel sick  Do not brush your teeth right after you eat  Products with real ginger are good for nausea, like ginger ale and ginger hard candy Make sure it says made with real ginger!  Sucking on sour candy like lemon heads is also good for nausea  If your prenatal vitamins make you nauseated, take them at night so you will sleep through the nausea  Sea Bands  If you feel like you need medicine for the nausea & vomiting please let us know  If you are unable to keep any fluids or food down please let us know   Constipation  Drink plenty of fluid, preferably water, throughout the day  Eat foods high in fiber such as fruits, vegetables, and grains  Exercise, such as walking, is a good way to keep your bowels regular  Drink warm fluids, especially warm prune juice, or decaf coffee  Eat a 1/2 cup of real oatmeal (not instant), 1/2 cup applesauce, and 1/2-1 cup warm prune juice every day  If needed, you may take Colace (docusate sodium) stool softener once or twice a day to help keep the stool soft. If you are pregnant, wait until you are out of your first trimester (12-14 weeks of pregnancy)  If you still are having problems with constipation, you may take Miralax once daily as needed to help keep your  bowels regular.  If you are pregnant, wait until you are out of your first trimester (12-14 weeks of pregnancy)   First Trimester of Pregnancy The first trimester of pregnancy is from week 1 until the end of week 12 (months 1 through 3). A week after a sperm fertilizes an egg, the egg will implant on the wall of the uterus. This embryo will begin to develop into a baby. Genes from you and your partner are forming the baby. The female genes determine whether the baby is a boy or a girl. At 6-8 weeks, the eyes and face are formed, and the heartbeat can be seen on ultrasound. At the end of 12 weeks, all the baby's organs are formed.  Now that you are pregnant, you will want to do everything you can to have a healthy baby. Two of the most important things are to get good prenatal care and to follow your health care provider's instructions. Prenatal care is all the medical care you receive before the baby's birth. This care will help prevent, find, and treat any problems during the pregnancy and childbirth. BODY CHANGES Your body goes through many changes during pregnancy. The changes vary from woman to woman.   You may gain or lose a couple of pounds at first.  You may feel sick to your stomach (nauseous) and throw up (vomit). If  the vomiting is uncontrollable, call your health care provider.  You may tire easily.  You may develop headaches that can be relieved by medicines approved by your health care provider.  You may urinate more often. Painful urination may mean you have a bladder infection.  You may develop heartburn as a result of your pregnancy.  You may develop constipation because certain hormones are causing the muscles that push waste through your intestines to slow down.  You may develop hemorrhoids or swollen, bulging veins (varicose veins).  Your breasts may begin to grow larger and become tender. Your nipples may stick out more, and the tissue that surrounds them (areola) may become  darker.  Your gums may bleed and may be sensitive to brushing and flossing.  Dark spots or blotches (chloasma, mask of pregnancy) may develop on your face. This will likely fade after the baby is born.  Your menstrual periods will stop.  You may have a loss of appetite.  You may develop cravings for certain kinds of food.  You may have changes in your emotions from day to day, such as being excited to be pregnant or being concerned that something may go wrong with the pregnancy and baby.  You may have more vivid and strange dreams.  You may have changes in your hair. These can include thickening of your hair, rapid growth, and changes in texture. Some women also have hair loss during or after pregnancy, or hair that feels dry or thin. Your hair will most likely return to normal after your baby is born. WHAT TO EXPECT AT YOUR PRENATAL VISITS During a routine prenatal visit:  You will be weighed to make sure you and the baby are growing normally.  Your blood pressure will be taken.  Your abdomen will be measured to track your baby's growth.  The fetal heartbeat will be listened to starting around week 10 or 12 of your pregnancy.  Test results from any previous visits will be discussed. Your health care provider may ask you:  How you are feeling.  If you are feeling the baby move.  If you have had any abnormal symptoms, such as leaking fluid, bleeding, severe headaches, or abdominal cramping.  If you have any questions. Other tests that may be performed during your first trimester include:  Blood tests to find your blood type and to check for the presence of any previous infections. They will also be used to check for low iron levels (anemia) and Rh antibodies. Later in the pregnancy, blood tests for diabetes will be done along with other tests if problems develop.  Urine tests to check for infections, diabetes, or protein in the urine.  An ultrasound to confirm the proper  growth and development of the baby.  An amniocentesis to check for possible genetic problems.  Fetal screens for spina bifida and Down syndrome.  You may need other tests to make sure you and the baby are doing well. HOME CARE INSTRUCTIONS  Medicines  Follow your health care provider's instructions regarding medicine use. Specific medicines may be either safe or unsafe to take during pregnancy.  Take your prenatal vitamins as directed.  If you develop constipation, try taking a stool softener if your health care provider approves. Diet  Eat regular, well-balanced meals. Choose a variety of foods, such as meat or vegetable-based protein, fish, milk and low-fat dairy products, vegetables, fruits, and whole grain breads and cereals. Your health care provider will help you determine the amount of  weight gain that is right for you.  Avoid raw meat and uncooked cheese. These carry germs that can cause birth defects in the baby.  Eating four or five small meals rather than three large meals a day may help relieve nausea and vomiting. If you start to feel nauseous, eating a few soda crackers can be helpful. Drinking liquids between meals instead of during meals also seems to help nausea and vomiting.  If you develop constipation, eat more high-fiber foods, such as fresh vegetables or fruit and whole grains. Drink enough fluids to keep your urine clear or pale yellow. Activity and Exercise  Exercise only as directed by your health care provider. Exercising will help you:  Control your weight.  Stay in shape.  Be prepared for labor and delivery.  Experiencing pain or cramping in the lower abdomen or low back is a good sign that you should stop exercising. Check with your health care provider before continuing normal exercises.  Try to avoid standing for long periods of time. Move your legs often if you must stand in one place for a long time.  Avoid heavy lifting.  Wear low-heeled  shoes, and practice good posture.  You may continue to have sex unless your health care provider directs you otherwise. Relief of Pain or Discomfort  Wear a good support bra for breast tenderness.   Take warm sitz baths to soothe any pain or discomfort caused by hemorrhoids. Use hemorrhoid cream if your health care provider approves.   Rest with your legs elevated if you have leg cramps or low back pain.  If you develop varicose veins in your legs, wear support hose. Elevate your feet for 15 minutes, 3-4 times a day. Limit salt in your diet. Prenatal Care  Schedule your prenatal visits by the twelfth week of pregnancy. They are usually scheduled monthly at first, then more often in the last 2 months before delivery.  Write down your questions. Take them to your prenatal visits.  Keep all your prenatal visits as directed by your health care provider. Safety  Wear your seat belt at all times when driving.  Make a list of emergency phone numbers, including numbers for family, friends, the hospital, and police and fire departments. General Tips  Ask your health care provider for a referral to a local prenatal education class. Begin classes no later than at the beginning of month 6 of your pregnancy.  Ask for help if you have counseling or nutritional needs during pregnancy. Your health care provider can offer advice or refer you to specialists for help with various needs.  Do not use hot tubs, steam rooms, or saunas.  Do not douche or use tampons or scented sanitary pads.  Do not cross your legs for long periods of time.  Avoid cat litter boxes and soil used by cats. These carry germs that can cause birth defects in the baby and possibly loss of the fetus by miscarriage or stillbirth.  Avoid all smoking, herbs, alcohol, and medicines not prescribed by your health care provider. Chemicals in these affect the formation and growth of the baby.  Schedule a dentist appointment. At  home, brush your teeth with a soft toothbrush and be gentle when you floss. SEEK MEDICAL CARE IF:   You have dizziness.  You have mild pelvic cramps, pelvic pressure, or nagging pain in the abdominal area.  You have persistent nausea, vomiting, or diarrhea.  You have a bad smelling vaginal discharge.  You have pain  with urination.  You notice increased swelling in your face, hands, legs, or ankles. SEEK IMMEDIATE MEDICAL CARE IF:   You have a fever.  You are leaking fluid from your vagina.  You have spotting or bleeding from your vagina.  You have severe abdominal cramping or pain.  You have rapid weight gain or loss.  You vomit blood or material that looks like coffee grounds.  You are exposed to [place] measles and have never had them.  You are exposed to fifth disease or chickenpox.  You develop a severe headache.  You have shortness of breath.  You have any kind of trauma, such as from a fall or a car accident. Document Released: 11/02/2001 Document Revised: 03/25/2014 Document Reviewed: 09/18/2013 [facility] Patient Information 2015 [place], [place]. This information is not intended to replace advice given to you by your health care provider. Make sure you discuss any questions you have with your health care provider.

## 2016-09-28 LAB — CBC
HEMATOCRIT: 35.2 % (ref 34.0–46.6)
HEMOGLOBIN: 11.6 g/dL (ref 11.1–15.9)
MCH: 25.7 pg — ABNORMAL LOW (ref 26.6–33.0)
MCHC: 33 g/dL (ref 31.5–35.7)
MCV: 78 fL — ABNORMAL LOW (ref 79–97)
Platelets: 354 10*3/uL (ref 150–379)
RBC: 4.51 x10E6/uL (ref 3.77–5.28)
RDW: 14.4 % (ref 12.3–15.4)
WBC: 10.3 10*3/uL (ref 3.4–10.8)

## 2016-09-28 LAB — PMP SCREEN PROFILE (10S), URINE
Amphetamine Screen, Ur: NEGATIVE ng/mL
BARBITURATE SCRN UR: NEGATIVE ng/mL
BENZODIAZEPINE SCREEN, URINE: NEGATIVE ng/mL
COCAINE(METAB.) SCREEN, URINE: NEGATIVE ng/mL
CREATININE(CRT), U: 39.6 mg/dL (ref 20.0–300.0)
Cannabinoids Ur Ql Scn: NEGATIVE ng/mL
Methadone Scn, Ur: NEGATIVE ng/mL
OPIATE SCRN UR: NEGATIVE ng/mL
OXYCODONE+OXYMORPHONE UR QL SCN: NEGATIVE ng/mL
PCP Scrn, Ur: NEGATIVE ng/mL
PROPOXYPHENE SCREEN: NEGATIVE ng/mL
Ph of Urine: 5.8 (ref 4.5–8.9)

## 2016-09-28 LAB — GC/CHLAMYDIA PROBE AMP
Chlamydia trachomatis, NAA: NEGATIVE
NEISSERIA GONORRHOEAE BY PCR: NEGATIVE

## 2016-09-28 LAB — URINALYSIS, ROUTINE W REFLEX MICROSCOPIC
Bilirubin, UA: NEGATIVE
Glucose, UA: NEGATIVE
KETONES UA: NEGATIVE
LEUKOCYTES UA: NEGATIVE
NITRITE UA: NEGATIVE
Protein, UA: NEGATIVE
RBC, UA: NEGATIVE
SPEC GRAV UA: 1.01 (ref 1.005–1.030)
Urobilinogen, Ur: 0.2 mg/dL (ref 0.2–1.0)
pH, UA: 6 (ref 5.0–7.5)

## 2016-09-28 LAB — ANTIBODY SCREEN: ANTIBODY SCREEN: NEGATIVE

## 2016-09-28 LAB — HIV ANTIBODY (ROUTINE TESTING W REFLEX): HIV SCREEN 4TH GENERATION: NONREACTIVE

## 2016-09-28 LAB — ABO/RH: RH TYPE: POSITIVE

## 2016-09-28 LAB — HEPATITIS B SURFACE ANTIGEN: HEP B S AG: NEGATIVE

## 2016-09-28 LAB — RPR: RPR: NONREACTIVE

## 2016-09-28 LAB — RUBELLA SCREEN: RUBELLA: 5.72 {index} (ref >0.99)

## 2016-09-28 LAB — VARICELLA ZOSTER ANTIBODY, IGG: VARICELLA: 1045 {index} (ref >165)

## 2016-09-29 ENCOUNTER — Telehealth: Payer: Self-pay | Admitting: Obstetrics and Gynecology

## 2016-09-29 ENCOUNTER — Telehealth: Payer: Self-pay | Admitting: Women's Health

## 2016-09-29 LAB — URINE CULTURE

## 2016-09-29 NOTE — Telephone Encounter (Signed)
Patient called stating pharmacy needs prior authorization for medication. Informed patient that pharmacy normally faxes over paper to be filled out but we have not received it. Stated she would call pharmacy to check on status.

## 2016-09-30 ENCOUNTER — Telehealth: Payer: Self-pay | Admitting: *Deleted

## 2016-09-30 ENCOUNTER — Encounter: Payer: Self-pay | Admitting: *Deleted

## 2016-09-30 NOTE — Telephone Encounter (Signed)
Patient states [facility] did not receive prior auth. Informed patient that Diclegis was approved by her pregnancy  Medicaid. [facility]

## 2016-09-30 NOTE — Telephone Encounter (Signed)
Patient returned call stating pharmacy did not have prior auth. Informed patient that it was completed and sent. Patient stated she would call [facility] to check again to see if they have it.

## 2016-10-01 ENCOUNTER — Telehealth: Payer: Self-pay | Admitting: Women's Health

## 2016-10-01 NOTE — Telephone Encounter (Signed)
Pt still having trouble with [facility], they are saying they can not file the Diclegis with pt's medicaid until it has gone though her primary insurance first.  [facility] and with Selena BattenKim and she suggest the pt try a different pharmacy of if pt does not want to do that can try phenergan.  Spoke with pt and [facility] [place].

## 2016-10-07 ENCOUNTER — Ambulatory Visit (HOSPITAL_COMMUNITY)
Admission: RE | Admit: 2016-10-07 | Discharge: 2016-10-07 | Disposition: A | Discharge status: Home / Self Care | Payer: 59 | Source: Ambulatory Visit | Attending: Women's Health | Admitting: Women's Health

## 2016-10-07 DIAGNOSIS — Z3A10 10 weeks gestation of pregnancy: Secondary | ICD-10-CM | POA: Insufficient documentation

## 2016-10-07 DIAGNOSIS — Z331 Pregnant state, incidental: Secondary | ICD-10-CM

## 2016-10-07 DIAGNOSIS — O161 Unspecified maternal hypertension, first trimester: Secondary | ICD-10-CM | POA: Diagnosis not present

## 2016-10-07 DIAGNOSIS — O26891 Other specified pregnancy related conditions, first trimester: Secondary | ICD-10-CM | POA: Diagnosis not present

## 2016-10-07 DIAGNOSIS — R030 Elevated blood-pressure reading, without diagnosis of hypertension: Secondary | ICD-10-CM | POA: Diagnosis not present

## 2016-10-07 DIAGNOSIS — I071 Rheumatic tricuspid insufficiency: Secondary | ICD-10-CM | POA: Insufficient documentation

## 2016-10-07 DIAGNOSIS — R05 Cough: Secondary | ICD-10-CM

## 2016-10-07 DIAGNOSIS — R059 Cough, unspecified: Secondary | ICD-10-CM

## 2016-10-07 DIAGNOSIS — I340 Nonrheumatic mitral (valve) insufficiency: Secondary | ICD-10-CM | POA: Diagnosis not present

## 2016-10-07 LAB — ECHOCARDIOGRAM COMPLETE
CHL CUP DOP CALC LVOT VTI: 24.8 cm
E decel time: 222 msec
E/e' ratio: 4.96
FS: 33 % (ref 28–44)
IV/PV OW: 1.01
LA diam end sys: 40 mm
LA diam index: 1.59 cm/m2
LA vol: 57.8 mL
LASIZE: 40 mm
LAVOLA4C: 65.9 mL
LAVOLIN: 23 mL/m2
LDCA: 3.46 cm2
LV E/e'average: 4.96
LV TDI E'LATERAL: 17.8
LV TDI E'MEDIAL: 11.5
LV sys vol: 34 mL (ref 14–42)
LVDIAVOL: 95 mL (ref 46–106)
LVDIAVOLIN: 38 mL/m2
LVEEMED: 4.96
LVELAT: 17.8 cm/s
LVOT peak grad rest: 6 mmHg
LVOTD: 21 mm
LVOTPV: 119 cm/s
LVOTSV: 86 mL
LVSYSVOLIN: 14 mL/m2
Lateral S' vel: 16.5 cm/s
MV Dec: 222
MV pk A vel: 57.6 m/s
MV pk E vel: 88.3 m/s
MVPG: 3 mmHg
PW: 10.4 mm — AB (ref 0.6–1.1)
RV TAPSE: 24.3 mm
RV sys press: 17 mmHg
Reg peak vel: 186 cm/s
Simpson's disk: 64
Stroke v: 60 ml
TRMAXVEL: 186 cm/s

## 2016-10-07 NOTE — Progress Notes (Signed)
*  PRELIMINARY RESULTS* Echocardiogram 2D Echocardiogram has been performed.  Stacey DrainWhite, Lymon Kidney J 10/07/2016, 9:13 AM

## 2016-10-11 ENCOUNTER — Encounter: Payer: Self-pay | Admitting: Obstetrics and Gynecology

## 2016-10-11 ENCOUNTER — Ambulatory Visit (INDEPENDENT_AMBULATORY_CARE_PROVIDER_SITE_OTHER): Payer: 59 | Admitting: Obstetrics and Gynecology

## 2016-10-11 VITALS — BP 152/74 | HR 68 | Wt 286.0 lb

## 2016-10-11 DIAGNOSIS — Z331 Pregnant state, incidental: Secondary | ICD-10-CM

## 2016-10-11 DIAGNOSIS — Z3401 Encounter for supervision of normal first pregnancy, first trimester: Secondary | ICD-10-CM

## 2016-10-11 DIAGNOSIS — Z1371 Encounter for nonprocreative screening for genetic disease carrier status: Secondary | ICD-10-CM

## 2016-10-11 DIAGNOSIS — O0991 Supervision of high risk pregnancy, unspecified, first trimester: Secondary | ICD-10-CM

## 2016-10-11 DIAGNOSIS — Z3A13 13 weeks gestation of pregnancy: Secondary | ICD-10-CM

## 2016-10-11 DIAGNOSIS — Z1389 Encounter for screening for other disorder: Secondary | ICD-10-CM

## 2016-10-11 DIAGNOSIS — O24419 Gestational diabetes mellitus in pregnancy, unspecified control: Secondary | ICD-10-CM

## 2016-10-11 DIAGNOSIS — O10919 Unspecified pre-existing hypertension complicating pregnancy, unspecified trimester: Secondary | ICD-10-CM

## 2016-10-11 LAB — POCT URINALYSIS DIPSTICK
Blood, UA: NEGATIVE
Glucose, UA: NEGATIVE
KETONES UA: NEGATIVE
LEUKOCYTES UA: NEGATIVE
NITRITE UA: NEGATIVE
PROTEIN UA: NEGATIVE

## 2016-10-11 MED ORDER — LABETALOL HCL 200 MG PO TABS: 200.0000 mg | ORAL_TABLET | Freq: Two times a day (BID) | ORAL | 3 refills | Status: DC

## 2016-10-11 NOTE — Progress Notes (Signed)
G3P1011  Estimated Date of Delivery: 5Z6X0960/30/18 LROB 672w5d  Blood pressure (!) 152/74, pulse 68, weight 286 lb (129.7 kg), last menstrual period 07/22/2016, unknown if currently breastfeeding.   Urine results:negative  Chief Complaint  Patient presents with  . Routine Prenatal Visit    still has cough    Patient complaints: she is concerned about her elevated blood pressure. She would like to have informaseq testing completed for this pregnancy.  Patient reports denies any bleeding, rupture of membranes,or regular contractions.   refer to the ob flow sheet for FH and FHR, ,                          Physical Examination: General appearance - alert, well appearing, and in no distress                                      Abdomen - FH not indicated at this visit ,                                                         -FHR 152                                                         soft, nontender, nondistended, no masses or organomegaly                                      Pelvic - not indicated                                            Advised patient to increase physical activity to maintain proper metabolism. Discussed how this will help overall health and maintain a healthy pregnancy. Questions were answered. Assessment: LROB G3P1011 @ 1172w5d   Plan:  Continued routine obstetrical care, will order desired lab testing in addition to baseline CMET, will start on Labetalol 200 mg BID for CHTN  F/u in 4 weeks for routine prenatal visit    By signing my name below, I, Sonum Patel, attest that this documentation has been prepared under the direction and in the presence of Tilda BurrowJohn V Raquon Milledge, MD. Electronically Signed: Sonum Patel, Neurosurgeoncribe. 10/11/16. 9:45 AM.  I personally performed the services described in this documentation, which was SCRIBED in my presence. The recorded information has been reviewed and considered accurate. It has been edited as necessary during review. Tilda BurrowFERGUSON,Breniyah Romm V,  MD

## 2016-10-12 LAB — COMPREHENSIVE METABOLIC PANEL
A/G RATIO: 1.5 (ref 1.2–2.2)
ALT: 7 IU/L (ref 0–32)
AST: 13 IU/L (ref 0–40)
Albumin: 3.9 g/dL (ref 3.5–5.5)
Alkaline Phosphatase: 62 IU/L (ref 39–117)
BUN/Creatinine Ratio: 16 (ref 9–23)
BUN: 8 mg/dL (ref 6–20)
Bilirubin Total: <0.2 mg/dL (ref 0.0–1.2)
CALCIUM: 9.2 mg/dL (ref 8.7–10.2)
CO2: 24 mmol/L (ref 18–29)
Chloride: 101 mmol/L (ref 96–106)
Creatinine, Ser: 0.51 mg/dL — ABNORMAL LOW (ref 0.57–1.00)
GFR, EST AFRICAN AMERICAN: 153 mL/min/{1.73_m2} (ref >59)
GFR, EST NON AFRICAN AMERICAN: 133 mL/min/{1.73_m2} (ref >59)
GLOBULIN, TOTAL: 2.6 g/dL (ref 1.5–4.5)
Glucose: 74 mg/dL (ref 65–99)
POTASSIUM: 4.5 mmol/L (ref 3.5–5.2)
SODIUM: 137 mmol/L (ref 134–144)
TOTAL PROTEIN: 6.5 g/dL (ref 6.0–8.5)

## 2016-10-19 ENCOUNTER — Telehealth: Payer: Self-pay | Admitting: Obstetrics and Gynecology

## 2016-10-19 DIAGNOSIS — Q999 Chromosomal abnormality, unspecified: Secondary | ICD-10-CM

## 2016-10-19 LAB — INFORMASEQ(SM) WITH XY ANALYSIS
Fetal Fraction (%):: 11.2
Gestational Age at Collection: 12.5 weeks
SCREEN RESULT: DETECTED — AB
Sex Chromosome Result: DETECTED — AB

## 2016-10-19 NOTE — Telephone Encounter (Signed)
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  Will refer to [facility] and MFM.

## 2016-10-22 ENCOUNTER — Other Ambulatory Visit: Payer: Self-pay | Admitting: *Deleted

## 2016-10-22 DIAGNOSIS — Q999 Chromosomal abnormality, unspecified: Secondary | ICD-10-CM

## 2016-10-26 ENCOUNTER — Ambulatory Visit (HOSPITAL_COMMUNITY)
Admission: RE | Admit: 2016-10-26 | Discharge: 2016-10-26 | Disposition: A | Discharge status: Home / Self Care | Payer: 59 | Source: Ambulatory Visit | Attending: Obstetrics and Gynecology | Admitting: Obstetrics and Gynecology

## 2016-10-26 ENCOUNTER — Encounter (HOSPITAL_COMMUNITY): Payer: Self-pay

## 2016-10-26 VITALS — BP 131/68 | HR 83 | Wt 283.0 lb

## 2016-10-26 DIAGNOSIS — Q999 Chromosomal abnormality, unspecified: Secondary | ICD-10-CM

## 2016-10-26 DIAGNOSIS — Z315 Encounter for genetic counseling: Secondary | ICD-10-CM | POA: Diagnosis not present

## 2016-10-26 DIAGNOSIS — O351XX0 Maternal care for (suspected) chromosomal abnormality in fetus, not applicable or unspecified: Secondary | ICD-10-CM | POA: Diagnosis not present

## 2016-10-26 DIAGNOSIS — Z3A15 15 weeks gestation of pregnancy: Secondary | ICD-10-CM | POA: Diagnosis not present

## 2016-10-26 DIAGNOSIS — O280 Abnormal hematological finding on antenatal screening of mother: Secondary | ICD-10-CM

## 2016-10-26 DIAGNOSIS — Z3A14 14 weeks gestation of pregnancy: Secondary | ICD-10-CM | POA: Insufficient documentation

## 2016-10-26 DIAGNOSIS — O132 Gestational [pregnancy-induced] hypertension without significant proteinuria, second trimester: Secondary | ICD-10-CM | POA: Insufficient documentation

## 2016-10-26 NOTE — Progress Notes (Signed)
Genetic Counseling  High-Risk Gestation Note  Appointment Date:  10/26/2016 Referred By: Mallory Shirk, MD Date of Birth:  Sep 01, 1991   Pregnancy History: G3P1011 Estimated Date of Delivery: 04/20/17 Estimated Gestational Age: 38w6dAttending: MGriffin Dakin MD  Ms. Patricia BERLANDwas seen for genetic counseling because of an increased risk for fetal Klinefelter syndrome based on noninvasive prenatal screening (NIPS)/cfDNA (cell free DNA) test results through her OB office.   In summary:  Reviewed NIPS (InformaSeq) result  XXY risk associated with 29% positive predictive value for the current pregnancy  Reviewed chromosomes and discussed varied features of XXY (Klinefelter)  Discussed additional options for screening  Maternal karyotype- declined  NIPS via different methodology - patient elected to pursue Panorama today  Ultrasound - available to patient in second trimester; she understands this has limited utility as a screen for sex chromosome aneuploidy  Discussed diagnostic testing options  Amniocentesis-declined  Postnatal chromosome analysis- patient prefers to pursue this option  Reviewed family history concerns  Patient has paternal uncles with retinitis pigmentosa  Father of the pregnancy has a brother with hydrocephalus  Discussed carrier screening options  CF- previously performed and within normal range  SMA- elected to pursue today (through integrated genetics)  Hemoglobinopathies-elected to pursue today  The results of the NIPS (testing performed through [facility]-InformaSeq) revealed an increased risk for fetal Klinefelter syndrome (XXY). We discussed the methodology of this testing and the sensitivity of sex chromosome abnormalities as well as the false positive rate. We reviewed that this testing is highly sensitive and specific, but is not considered diagnostic. We also reviewed that the positive predictive value (the chance that a positive result is a  true positive result) is not the same as the detection rate and that this number is actually far below 80% in women who are 348and older. Specifically for the current pregnancy, the positive predictive value of this screen for XXY is approximately 29%. We discussed that results are based on the assumption that maternal chromosomes are normal (46,XX); however, they were counseled that maternal cells can have mitotic nondisjunction changes (age related) that can influence the results and the accuracy of this technology. Additionally, we review that the cell free DNA test can not distinguish between aneuploidy confined to the placenta, fetal XXY, other underlying chromosome aberrations involving these chromosomes, or mosaicism (fetal or placental).   Considering the possibility of Klinefelter syndrome, we spent time discussing this condition and the variable features. We reviewed chromosomes, nondisjunction, and that chromosome division errors happen by chance and are not usually inherited. The first 22 chromosome pairs are the autosomes and typically are the same in males and females. The 23rd pair is referred to as the sex chromosomes. Typically females have two X chromosomes, and males typically have one X and one Y chromosome. Klinefelter syndrome occurs as the result of an additional X chromosome in males, denoted as 424 XXY.   She was counseled that Klinefelter syndrome is the most common sex chromosome condition, with a prevalence of approximately 1 in 662males. Klinefelter syndrome is most commonly ascertained either by prenatal diagnosis, or during a work-up for delayed puberty or infertility. We discussed that the features of Klinefelter are variable and are age-dependent. Newborns with Klinefelter syndrome have relatively few, if any distinguishing characteristics, although the risk of hypotonia is increased and may be noticeable shortly after birth. Characteristics in childhood and into adulthood may  include tall stature, testicular failure, azoospermia, and lack of pubertal virilization. Individuals with  47,XXY typically have a unique cognitive and behavioral profile, which may include an increased risk of learning disabilities (dyslexia and attention-deficit hyperactivity disorder), differences in language development, and social/emotional problems. We discussed that early intervention services can be effective in reducing many of these issues. Additionally, testosterone replacement therapy has also been shown to be effective in alleviating certain behavioral and physical (pubertal) concerns. Literature reports that infertility is almost always inevitable for males with 47,XXY, outside of medical intervention due to azoospermia or oligospermia. Many males with Klinefelter syndrome have gone on to have successful pregnancies, but in most cases these pregnancies have required assisted reproductive therapies.   We discussed the availability of amniocentesis (after [redacted] weeks gestation) for chromosome analysis in the pregnancy. We discussed the risks, benefits, and limitations of these tests including the associated 1 in 916-384 risk for complications from amniocentesis, including spontaneous pregnancy loss. They understand that this testing would not diagnose or rule out all genetic conditions or birth defects. Additionally, we discussed the option of postnatal chromosome analysis via peripheral blood to confirm or rule out the NIPS result. We discussed that detailed ultrasound would likely not be helpful in adjusting the risk of Klinefelter syndrome in the pregnancy, given that fetuses with Klinefelter's syndrome typically do not have major anatomic differences or markers for aneuploidy.  We also discussed the option of performing NIPS through a different laboratory (one which looks at unique DNA sequences to determine sex chromosome count). Given the technology used by the NIPS performed for the patient  (InformaSeq), we offered maternal peripheral blood chromosome analysis to assess for maternal X chromosome aneuploidy.  We discussed the possible results that the tests might provide including: positive, negative, unanticipated, and no result. Finally, they were counseled regarding the cost of each option and potential out of pocket expenses. After careful consideration, [name] elected to proceed with NIPS through a different laboratory today (Panorama through [facility]) and declined amniocentesis and maternal karyotype analysis. The patient stated that she would prefer to pursue postnatal chromosome analysis to diagnosis or rule out XXY.  [name] was provided with written information regarding cystic fibrosis (CF), spinal muscular atrophy (SMA) and hemoglobinopathies including the carrier frequency, availability of carrier screening and prenatal diagnosis if indicated.  In addition, we discussed that CF and hemoglobinopathies are routinely screened for as part of the [facility] newborn screening panel. CF carrier screening was previously performed in 2016 and was negative for the 97 mutations analyzed. Thus, her risk to be a CF carrier has been significantly reduced.  After further discussion, she elected to pursue SMA carrier screening through [facility] and hemoglobinopathy evaluation today.  Both family histories were reviewed and found to be contributory for hydrocephalus for the father of the pregnancy's brother. His brother reportedly has had shunt surgery for hydrocephalus and has epilepsy and intellectual disability subsequent to hydrocephalus. He is unable to live independently. He is 26 years old, and the patient did not describe additional dysmorphism or additional medical issues for him. No known etiology was reported for the hydrocephalus, and no additional relatives were reported with hydrocephalus. Hydrocephalus can be isolated (nonsyndromic) or seen as one feature of  an underlying chromosome or genetic condition. Hydrocephalus is typically isolated and multifactorial, involving a combination of genetic and environmental contributing factors.  Rarely, nonsyndromic hydrocephalus can follow autosomal recessive or autosomal dominant inheritance. X-linked hydrocephalus is also observed in some families. We discussed that when isolated and when multifactorial inheritance is suspected, recurrence risk  for full siblings is approximately 1-2%. Thus, given the reported family history and the degree of relation (a second degree relative to the current pregnancy), recurrence risk for the current pregnancy may be slightly increased above the general population risk but still overall likely low. Additional information regarding an underlying cause for hydrocephalus in the family may alter recurrence risk assessment. Targeted ultrasound is available to assess for features of hydrocephalus prenatally. However, the patient understands that ultrasound cannot diagnose or rule out all birth defects prenatally.    [name] reported three paternal uncles with retinitis pigmentosa. One uncle had onset in his 28's, and the other two uncles had onset of symptoms in their 10's. Her father is currently 20 years old and reportedly does not have symptoms of retinitis pigmentosa. No additional siblings to her father were reported with RP, and the offspring of the affected uncles reportedly do not currently have symptoms, though their ages range from 46's to 12's. I met with 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  Retinitis pigmentosa (RP) occurs in ~1 in 3500 individuals in the [place]. RP refers to a group of inherited disorders in which abnormalities of the photoreceptors or the retinal pigment epithelium of the retina lead to progressive visual loss. Most affected individuals first experience night blindness and eventually have  loss of central vision late in the course of the disease. We discussed that the genetics of RP is quite complex, with significant genetic heterogeneity being observed. To date more than 40 genes are known to contribute to causation. RP is inherited in a variety of patterns including: autosomal recessive, X-linked, autosomal dominant, and rarely digenic patterns. She was counseled that RP can be nonsyndromic (only affecting vision) or a feature of an underlying genetic syndrome (affecting more than one organ system).   We discussed that the reported family history was not suggestive of a syndromic form of RP, though limited information was obtained regarding the affected uncles. The reported family history could be consistent with autosomal recessive or X-linked, with other forms of inheritance possible but less likely. Given the reported family history and assuming her father is truly asymptomatic, recurrence risk for Ms. Broadnax and thus, her children would likely be low. However, additional information is needed regarding these relatives to most accurately assess recurrence risk for relatives. We discussed the availability of a consultation with a medical geneticist for her uncles to help clarify the underlying etiology. She was encouraged to review her family history with her children's pediatrician to allow for appropriate screening and management. Prenatal diagnosis for RP is only available if the specific familial genetic alteration is known. Without further information regarding the provided family history, an accurate genetic risk cannot be calculated. Further genetic counseling is warranted if more information is obtained.  [name] also reported an additional paternal uncle with intellectual disability. She had limited information regarding him given that he died in his 51's from choking prior to the patient being born. He lived with relatives and was not described to have dysmorphic features. No  underlying cause was reportedly known for his intellectual disability. [name] was counseled that there are many different causes of intellectual disabilities including environmental, multifactorial, and genetic etiologies.  We discussed that a specific diagnosis for intellectual disability can be determined in approximately 50% of these individuals.  In the remaining 50% of individuals, a diagnosis may never be determined.  Regarding genetic causes, we discussed  that chromosome aberrations (aneuploidy, deletions, duplications, insertions, and translocations) are responsible for a small percentage of individuals with intellectual disability.  Many individuals with chromosome aberrations have additional differences, including congenital anomalies or minor dysmorphisms.  Likewise, single gene conditions are the underlying cause of intellectual delay in some families.  We discussed that many gene conditions have intellectual disability as a feature, but also often include other physical or medical differences.  Specifically, we reviewed fragile X syndrome and the X-linked inheritance of this condition. We discussed that without more specific information, it is difficult to provide an accurate risk assessment.  Further genetic counseling is warranted if more information is obtained.  [name] denied exposure to environmental toxins or chemical agents. She denied the use of alcohol, tobacco or street drugs. She denied significant viral illnesses during the course of her pregnancy. Her medical and surgical histories were contributory for hypertension, for which she is currently treated with labetalol.   I counseled [name] regarding the above risks and available options.  The approximate face-to-face time with the genetic counselor was 50 minutes.  Chipper Oman, MS,  Certified Genetic Counselor 10/26/2016

## 2016-10-26 NOTE — Progress Notes (Signed)
The patient underwent full genetic counseling and after reviewing risks and benefits has elected to perform a second NIPT with a different methodology that will give her a more accurate evaluation of her baby's risk for sex chromosome aneuploidy (the InformaSEQ test has c.22% PPV for sex chromosome aneuploidies). She does not wish to undergo amniocentesis. After discussing all these findings with the patient, I feel that a full MD consult is not strictly necessary, and the patient agrees. 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 I did not make any follow up appointment, but we are happy to assist in her management or perform anatomic survey/growth ultrasounds, at your discretion.  Thanks you for allowing us to participate in your patient's care.

## 2016-10-27 ENCOUNTER — Other Ambulatory Visit: Payer: Self-pay | Admitting: Advanced Practice Midwife

## 2016-10-27 ENCOUNTER — Telehealth: Payer: Self-pay | Admitting: *Deleted

## 2016-10-27 ENCOUNTER — Encounter: Payer: Self-pay | Admitting: Advanced Practice Midwife

## 2016-10-27 LAB — HEMOGLOBINOPATHY EVALUATION
HGB A2 QUANT: 2.7 % (ref 0.7–3.1)
HGB F QUANT: 0 % (ref 0.0–2.0)
HGB S QUANTITAION: 0 %
Hgb A: 97.3 % (ref 94.0–98.0)
Hgb C: 0 %

## 2016-10-27 MED ORDER — BENZONATATE 200 MG PO CAPS: 200.0000 mg | ORAL_CAPSULE | Freq: Three times a day (TID) | ORAL | 0 refills | Status: DC | PRN

## 2016-10-27 NOTE — Progress Notes (Signed)
I never heard back from pt so I sent a cough med to th epharmacy

## 2016-10-29 ENCOUNTER — Other Ambulatory Visit (HOSPITAL_COMMUNITY): Payer: Self-pay

## 2016-11-03 ENCOUNTER — Other Ambulatory Visit: Payer: Self-pay | Admitting: Advanced Practice Midwife

## 2016-11-03 ENCOUNTER — Telehealth: Payer: Self-pay | Admitting: *Deleted

## 2016-11-03 MED ORDER — BUTALBITAL-APAP-CAFFEINE 50-325-40 MG PO TABS: 1.0000 | ORAL_TABLET | Freq: Four times a day (QID) | ORAL | 1 refills | Status: DC | PRN

## 2016-11-03 NOTE — Telephone Encounter (Signed)
Patient called stating she has had a headache since yesterday with no relief from Tylenol. [facility]85. She is currently taking Labetolol 200 BID. Call routed to F. C-Dishmon, CNM who will talk with patient.

## 2016-11-03 NOTE — Progress Notes (Signed)
fioricet for HA.  BP 130/86  Plans on picking up a cuff

## 2016-11-04 ENCOUNTER — Telehealth (HOSPITAL_COMMUNITY): Payer: Self-pay | Admitting: MS"

## 2016-11-04 NOTE — Telephone Encounter (Signed)
Attempted to call patient regarding results of noninvasive prenatal screening (Panorama), SMA carrier screening, and hemoglobin electrophoresis, which were within normal range. Left message for patient to return call.   Patricia BraunKaren Byron Tipping 11/04/2016 9:19 AM

## 2016-11-04 NOTE — Telephone Encounter (Signed)
I sent fx for fiorect, pt's blood pressre was OK. She is going to buy a cuff for  Home monitorint

## 2016-11-04 NOTE — Telephone Encounter (Signed)
Called Patricia [name] to discuss her prenatal cell free DNA test results.  [name] had Panorama testing through [facility].  Testing was offered because of increased risk for XXY from NIPS using a different platform.   The patient was identified by name and DOB.  We reviewed that these are within normal limits, showing a less than 1 in 10,000 risk for trisomies 21, 18 and 13, and monosomy X (Turner syndrome).  In addition, the risk for triploidy and sex chromosome trisomies (47,XXX and 47,XXY) was also low risk. We reviewed that this testing identifies > 99% of pregnancies with trisomy 6121, trisomy 6913, sex chromosome trisomies (47,XXX and 47,XXY), and triploidy. The detection rate for trisomy 18 is 96%.  The detection rate for monosomy X is ~92%.  The false positive rate is <0.1% for all conditions. Testing was also consistent with female fetal sex.  The patient did wish to know fetal sex.    We reviewed that while this does not eliminate the previous high risk result from InformaSeq for XXY, it likely indicates a less than 29% risk for XXY (which was the positive predictive value of the previous NIPS result). Patient indicated that she feels relieved by this second NIPS result but would still like to pursue postnatal chromosome analysis for the baby to rule out XXY. She understands that this testing does not identify all genetic conditions.    Carrier screening for Spinal Muscular Atrophy (SMA) was performed for the patient and was within normal range, identifying the presence of 3 SMN1 genes. Thus, her risk to be an SMA carrier has been reduced to 1 in 4200. We also reviewed that hemoglobin electrophoresis identified the presence of normal adult hemoglobin (Hb AA), thus, indicating she is not likely a carrier of a hemoglobin variant.   All questions were answered to her satisfaction, she was encouraged to call with additional questions or concerns.  Quinn PlowmanKaren Hadlyn Amero, MS Administrator, sportsCertified  Genetic Counselor

## 2016-11-05 ENCOUNTER — Other Ambulatory Visit (HOSPITAL_COMMUNITY): Payer: Self-pay

## 2016-11-08 ENCOUNTER — Ambulatory Visit (INDEPENDENT_AMBULATORY_CARE_PROVIDER_SITE_OTHER): Payer: 59 | Admitting: Women's Health

## 2016-11-08 ENCOUNTER — Encounter: Payer: Self-pay | Admitting: Women's Health

## 2016-11-08 VITALS — BP 134/70 | HR 88 | Wt 286.0 lb

## 2016-11-08 DIAGNOSIS — O285 Abnormal chromosomal and genetic finding on antenatal screening of mother: Secondary | ICD-10-CM

## 2016-11-08 DIAGNOSIS — Z1389 Encounter for screening for other disorder: Secondary | ICD-10-CM

## 2016-11-08 DIAGNOSIS — O10919 Unspecified pre-existing hypertension complicating pregnancy, unspecified trimester: Secondary | ICD-10-CM | POA: Insufficient documentation

## 2016-11-08 DIAGNOSIS — Z3A17 17 weeks gestation of pregnancy: Secondary | ICD-10-CM

## 2016-11-08 DIAGNOSIS — O0990 Supervision of high risk pregnancy, unspecified, unspecified trimester: Secondary | ICD-10-CM

## 2016-11-08 DIAGNOSIS — Z331 Pregnant state, incidental: Secondary | ICD-10-CM

## 2016-11-08 DIAGNOSIS — O10912 Unspecified pre-existing hypertension complicating pregnancy, second trimester: Secondary | ICD-10-CM

## 2016-11-08 DIAGNOSIS — Z363 Encounter for antenatal screening for malformations: Secondary | ICD-10-CM

## 2016-11-08 DIAGNOSIS — Z3682 Encounter for antenatal screening for nuchal translucency: Secondary | ICD-10-CM

## 2016-11-08 DIAGNOSIS — O0992 Supervision of high risk pregnancy, unspecified, second trimester: Secondary | ICD-10-CM

## 2016-11-08 LAB — POCT URINALYSIS DIPSTICK
Glucose, UA: NEGATIVE
Ketones, UA: NEGATIVE
Leukocytes, UA: NEGATIVE
NITRITE UA: NEGATIVE
PROTEIN UA: NEGATIVE
RBC UA: NEGATIVE

## 2016-11-08 NOTE — Progress Notes (Signed)
High Risk Pregnancy Diagnosis(es): CHTN G3P1011 46w5dEstimated Date of Delivery: 04/20/17 BP 134/70   Pulse 88   Wt 286 lb (129.7 kg)   LMP 07/22/2016 (Exact Date)   BMI 47.59 kg/m   Urinalysis: Negative HPI:  Doing well, has met w/ genetic counselor d/t abnormal Informaseq results XXY (29% risk)- normal panorama, declined further testing. Per [facility], still needs AFP today for NTD.  BP, weight, and urine reviewed.  No fm yet. Denies cramping, lof, vb, uti s/s. Cough improved w/ inhaler from [facility] appt. Occ headache, doesn't have home bp cuff.   Fundal Height:  16wks Fetal Heart rate:  143 Edema:  none  Reviewed warning s/s to report. Check bp's QID at home, keep log All questions were answered Assessment: 144w5dHTN Medication(s) Plans:  Continue labetalol 20081mID, baby ASA daily Treatment Plan:  Needs 24hr urine- ordered today. Has already had CMP. Growth u/s @ 20, 28, 34, 38wks     2x/wk testing nst/sono @ 32wks    Deliver @ 39wks (meds) Follow up in 4wks for high-risk OB appt and anatomy u/s AFP today

## 2016-11-08 NOTE — Patient Instructions (Signed)
Check blood pressure 4 times daily (when you wake up, at lunch, supper, and bedtime), keep a log. Call us if consistently >150 on top or >90 on bottom  Second Trimester of Pregnancy The second trimester is from week 13 through week 28 (months 4 through 6). The second trimester is often a time when you feel your best. Your body has also adjusted to being pregnant, and you begin to feel better physically. Usually, morning sickness has lessened or quit completely, you may have more energy, and you may have an increase in appetite. The second trimester is also a time when the fetus is growing rapidly. At the end of the sixth month, the fetus is about 9 inches long and weighs about 1 pounds. You will likely begin to feel the baby move (quickening) between 18 and 20 weeks of the pregnancy. Body changes during your second trimester Your body continues to go through many changes during your second trimester. The changes vary from woman to woman.  Your weight will continue to increase. You will notice your lower abdomen bulging out.  You may begin to get stretch marks on your hips, abdomen, and breasts.  You may develop headaches that can be relieved by medicines. The medicines should be approved by your health care provider.  You may urinate more often because the fetus is pressing on your bladder.  You may develop or continue to have heartburn as a result of your pregnancy.  You may develop constipation because certain hormones are causing the muscles that push waste through your intestines to slow down.  You may develop hemorrhoids or swollen, bulging veins (varicose veins).  You may have back pain. This is caused by:  Weight gain.  Pregnancy hormones that are relaxing the joints in your pelvis.  A shift in weight and the muscles that support your balance.  Your breasts will continue to grow and they will continue to become tender.  Your gums may bleed and may be sensitive to brushing and  flossing.  Dark spots or blotches (chloasma, mask of pregnancy) may develop on your face. This will likely fade after the baby is born.  A dark line from your belly button to the pubic area (linea nigra) may appear. This will likely fade after the baby is born.  You may have changes in your hair. These can include thickening of your hair, rapid growth, and changes in texture. Some women also have hair loss during or after pregnancy, or hair that feels dry or thin. Your hair will most likely return to normal after your baby is born. What to expect at prenatal visits During a routine prenatal visit:  You will be weighed to make sure you and the fetus are growing normally.  Your blood pressure will be taken.  Your abdomen will be measured to track your baby's growth.  The fetal heartbeat will be listened to.  Any test results from the previous visit will be discussed. Your health care provider may ask you:  How you are feeling.  If you are feeling the baby move.  If you have had any abnormal symptoms, such as leaking fluid, bleeding, severe headaches, or abdominal cramping.  If you are using any tobacco products, including cigarettes, chewing tobacco, and electronic cigarettes.  If you have any questions. Other tests that may be performed during your second trimester include:  Blood tests that check for:  Low iron levels (anemia).  Gestational diabetes (between 24 and 28 weeks).  Rh antibodies.  This is to check for a protein on red blood cells (Rh factor).  Urine tests to check for infections, diabetes, or protein in the urine.  An ultrasound to confirm the proper growth and development of the baby.  An amniocentesis to check for possible genetic problems.  Fetal screens for spina bifida and Down syndrome.  HIV (human immunodeficiency virus) testing. Routine prenatal testing includes screening for HIV, unless you choose not to have this test. Follow these instructions at  home: Eating and drinking  Continue to eat regular, healthy meals.  Avoid raw meat, uncooked cheese, cat litter boxes, and soil used by cats. These carry germs that can cause birth defects in the baby.  Take your prenatal vitamins.  Take 1500-2000 mg of calcium daily starting at the 20th week of pregnancy until you deliver your baby.  If you develop constipation:  Take over-the-counter or prescription medicines.  Drink enough fluid to keep your urine clear or pale yellow.  Eat foods that are high in fiber, such as fresh fruits and vegetables, whole grains, and beans.  Limit foods that are high in fat and processed sugars, such as fried and sweet foods. Activity  Exercise only as directed by your health care provider. Experiencing uterine cramps is a good sign to stop exercising.  Avoid heavy lifting, wear low heel shoes, and practice good posture.  Wear your seat belt at all times when driving.  Rest with your legs elevated if you have leg cramps or low back pain.  Wear a good support bra for breast tenderness.  Do not use hot tubs, steam rooms, or saunas. Lifestyle  Avoid all smoking, herbs, alcohol, and unprescribed drugs. These chemicals affect the formation and growth of the baby.  Do not use any products that contain nicotine or tobacco, such as cigarettes and e-cigarettes. If you need help quitting, ask your health care provider.  A sexual relationship may be continued unless your health care provider directs you otherwise. General instructions  Follow your health care provider's instructions regarding medicine use. There are medicines that are either safe or unsafe to take during pregnancy.  Take warm sitz baths to soothe any pain or discomfort caused by hemorrhoids. Use hemorrhoid cream if your health care provider approves.  If you develop varicose veins, wear support hose. Elevate your feet for 15 minutes, 3-4 times a day. Limit salt in your diet.  Visit your  dentist if you have not gone yet during your pregnancy. Use a soft toothbrush to brush your teeth and be gentle when you floss.  Keep all follow-up prenatal visits as told by your health care provider. This is important. Contact a health care provider if:  You have dizziness.  You have mild pelvic cramps, pelvic pressure, or nagging pain in the abdominal area.  You have persistent nausea, vomiting, or diarrhea.  You have a bad smelling vaginal discharge.  You have pain with urination. Get help right away if:  You have a fever.  You are leaking fluid from your vagina.  You have spotting or bleeding from your vagina.  You have severe abdominal cramping or pain.  You have rapid weight gain or weight loss.  You have shortness of breath with chest pain.  You notice sudden or extreme swelling of your face, hands, ankles, feet, or legs.  You have not felt your baby move in over an hour.  You have severe headaches that do not go away with medicine.  You have vision changes. Summary  The second trimester is from week 13 through week 28 (months 4 through 6). It is also a time when the fetus is growing rapidly.  Your body goes through many changes during pregnancy. The changes vary from woman to woman.  Avoid all smoking, herbs, alcohol, and unprescribed drugs. These chemicals affect the formation and growth your baby.  Do not use any tobacco products, such as cigarettes, chewing tobacco, and e-cigarettes. If you need help quitting, ask your health care provider.  Contact your health care provider if you have any questions. Keep all prenatal visits as told by your health care provider. This is important. This information is not intended to replace advice given to you by your health care provider. Make sure you discuss any questions you have with your health care provider. Document Released: 11/02/2001 Document Revised: 04/15/2016 Document Reviewed: 01/09/2013 Elsevier Interactive  Patient Education  2017 [employer].

## 2016-11-10 LAB — AFP, QUAD SCREEN
DIA MOM VALUE: 0.76
DIA Value (EIA): 93.99 pg/mL
DSR (BY AGE) 1 IN: 943
DSR (SECOND TRIMESTER) 1 IN: 10000
Gestational Age: 16.7 WEEKS
MATERNAL AGE AT EDD: 26.7 a
MSAFP Mom: 1.02
MSAFP: 28 ng/mL
MSHCG MOM: 1.46
MSHCG: 33274 m[IU]/mL
OSB RISK: 10000
T18 (By Age): 1:3672 {titer}
TEST RESULTS AFP: NEGATIVE
WEIGHT: 286 [lb_av]
uE3 Mom: 1.26
uE3 Value: 1.06 ng/mL

## 2016-11-22 NOTE — L&D Delivery Note (Signed)
Patient is 26 y.o. [id number] 5529w1d admitted for IOL, hx of chronic HTN.    Delivery Note At 10:21 PM a healthy female was delivered via Vaginal, Spontaneous Delivery (Presentation: occiput; anterior).  APGAR: 9, 9; weight pending.   Placenta status: intact.  Cord: 3V with the following complications: none.  Cord pH: N/A  Anesthesia:  Epidural Episiotomy: None Lacerations: None Est. Blood Loss (mL): 234  Mom to postpartum.  Baby to Couplet care / Skin to Skin.  Upon arrival patient was complete and pushing. She pushed with good maternal effort to deliver a healthy baby boy. Baby delivered without difficulty, was noted to have good tone and place on maternal abdomen for oral suctioning, drying and stimulation. Delayed cord clamping performed. Placenta delivered intact with 3V cord. Vaginal canal and perineum was inspected and no lacerations noted; hemostatic. Pitocin was started and uterus massaged until bleeding slowed. Counts of sharps, instruments, and lap pads were all correct.    Tarri AbernethyAbigail J Lancaster, MD 04/14/2017, 10:36 PM   The above was performed under my direct supervision and guidance.

## 2016-11-24 ENCOUNTER — Ambulatory Visit (INDEPENDENT_AMBULATORY_CARE_PROVIDER_SITE_OTHER): Payer: 59 | Admitting: Obstetrics and Gynecology

## 2016-11-24 ENCOUNTER — Telehealth: Payer: Self-pay | Admitting: Obstetrics & Gynecology

## 2016-11-24 ENCOUNTER — Encounter: Payer: Self-pay | Admitting: Obstetrics and Gynecology

## 2016-11-24 VITALS — BP 134/66 | HR 92 | Wt 284.0 lb

## 2016-11-24 DIAGNOSIS — R32 Unspecified urinary incontinence: Secondary | ICD-10-CM

## 2016-11-24 DIAGNOSIS — J069 Acute upper respiratory infection, unspecified: Secondary | ICD-10-CM

## 2016-11-24 DIAGNOSIS — N898 Other specified noninflammatory disorders of vagina: Secondary | ICD-10-CM

## 2016-11-24 DIAGNOSIS — O26893 Other specified pregnancy related conditions, third trimester: Secondary | ICD-10-CM

## 2016-11-24 DIAGNOSIS — O0992 Supervision of high risk pregnancy, unspecified, second trimester: Secondary | ICD-10-CM

## 2016-11-24 DIAGNOSIS — Z331 Pregnant state, incidental: Secondary | ICD-10-CM

## 2016-11-24 DIAGNOSIS — O285 Abnormal chromosomal and genetic finding on antenatal screening of mother: Secondary | ICD-10-CM

## 2016-11-24 DIAGNOSIS — O10912 Unspecified pre-existing hypertension complicating pregnancy, second trimester: Secondary | ICD-10-CM

## 2016-11-24 DIAGNOSIS — Z3A19 19 weeks gestation of pregnancy: Secondary | ICD-10-CM

## 2016-11-24 MED ORDER — PROAIR HFA 108 (90 BASE) MCG/ACT IN AERS: 2.0000 | INHALATION_SPRAY | RESPIRATORY_TRACT | 4 refills | Status: DC | PRN

## 2016-11-24 NOTE — Telephone Encounter (Signed)
Pt called stating that she would like a call back from the nurse, pt did not state the reason why please contact pt

## 2016-11-24 NOTE — Progress Notes (Signed)
Patient ID: Patricia [name], female   DOB: 1991/09/30, 26 y.o.   MRN: [id number]  High Risk Pregnancy Diagnosis(es):   CHTN  G3P1011 8569w0d Estimated Date of Delivery: 04/20/17  Blood pressure 134/66, pulse 92, weight 284 lb (128.8 kg), last menstrual period 07/22/2016, unknown if currently breastfeeding.  Urinalysis: Negative   HPI: The patient is being seen today for ongoing management of CHTN. Today she reports that she is leaking vaginal fluids. Pt notes that she has had a cough recently. Pt has tried an albuterol inhaler without cough medications with no relief of her symptoms. Pt denies any other symptoms.    BP weight and urine results all reviewed and noted. Patient reports good fetal movement, denies any bleeding and no rupture of membranes symptoms or regular contractions.  Physical Examination:  Pelvic - normal external genitalia, vulva, vagina, cervix, uterus and adnexa,  VULVA: normal appearing vulva with no masses, tenderness or lesions,  VAGINA: normal appearing vagina with normal color and discharge, no lesions, vaginal secretions pH 5.0. No suspicion of membrane rupture.   Patient is without complaints other than noted in her HPI. All questions were answered.  All lab and sonogram results have been reviewed. Comments: not done  Assessment:  1.  Pregnancy at 569w0d,  Estimated Date of Delivery: 04/20/17 :                        2.  CHTN   3. URI with cough   4. Stress urinary incontinence  NO evidence of ROM  Medication(s) Plans:  Refill ProAir inhaler  Treatment Plan:  Follow up in 2 weeks for routine OB  No Follow-up on file. for appointment for high risk OB care  No orders of the defined types were placed in this encounter.  No orders of the defined types were placed in this encounter.  By signing my name below, I, Soijett Blue, attest that this documentation has been prepared under the direction and in the presence of Tilda BurrowJohn V Inola Lisle, MD. Electronically  Signed: Soijett Blue, ED Scribe. 11/24/16. 2:06 PM.  I personally performed the services described in this documentation, which was SCRIBED in my presence. The recorded information has been reviewed and considered accurate. It has been edited as necessary during review. Tilda BurrowFERGUSON,Maurizio Geno V, MD

## 2016-11-24 NOTE — Telephone Encounter (Signed)
Patient called stating she noticed a "glob" of white stuff in the toilet on Thursday. Since then she has noticed continued leaking requiring to wear a pad. I advised patient to come to the office to rule out rupture. Transferred to appointments.

## 2016-11-30 LAB — PROTEIN, URINE, 24 HOUR
PROTEIN 24H UR: 146 mg/(24.h) (ref 30–150)
PROTEIN UR: 10.4 mg/dL

## 2016-12-06 ENCOUNTER — Ambulatory Visit (INDEPENDENT_AMBULATORY_CARE_PROVIDER_SITE_OTHER): Payer: 59 | Admitting: Women's Health

## 2016-12-06 ENCOUNTER — Ambulatory Visit (INDEPENDENT_AMBULATORY_CARE_PROVIDER_SITE_OTHER): Payer: 59

## 2016-12-06 ENCOUNTER — Encounter: Payer: Self-pay | Admitting: Women's Health

## 2016-12-06 VITALS — BP 128/78 | HR 76 | Wt 281.0 lb

## 2016-12-06 DIAGNOSIS — Z1389 Encounter for screening for other disorder: Secondary | ICD-10-CM

## 2016-12-06 DIAGNOSIS — O10919 Unspecified pre-existing hypertension complicating pregnancy, unspecified trimester: Secondary | ICD-10-CM

## 2016-12-06 DIAGNOSIS — Z3A21 21 weeks gestation of pregnancy: Secondary | ICD-10-CM

## 2016-12-06 DIAGNOSIS — O10913 Unspecified pre-existing hypertension complicating pregnancy, third trimester: Secondary | ICD-10-CM | POA: Diagnosis not present

## 2016-12-06 DIAGNOSIS — O0992 Supervision of high risk pregnancy, unspecified, second trimester: Secondary | ICD-10-CM

## 2016-12-06 DIAGNOSIS — Z363 Encounter for antenatal screening for malformations: Secondary | ICD-10-CM | POA: Diagnosis not present

## 2016-12-06 DIAGNOSIS — O285 Abnormal chromosomal and genetic finding on antenatal screening of mother: Secondary | ICD-10-CM | POA: Diagnosis not present

## 2016-12-06 DIAGNOSIS — O10912 Unspecified pre-existing hypertension complicating pregnancy, second trimester: Secondary | ICD-10-CM

## 2016-12-06 DIAGNOSIS — Z331 Pregnant state, incidental: Secondary | ICD-10-CM

## 2016-12-06 DIAGNOSIS — O0990 Supervision of high risk pregnancy, unspecified, unspecified trimester: Secondary | ICD-10-CM

## 2016-12-06 DIAGNOSIS — O99212 Obesity complicating pregnancy, second trimester: Secondary | ICD-10-CM

## 2016-12-06 LAB — POCT URINALYSIS DIPSTICK
Blood, UA: NEGATIVE
Glucose, UA: NEGATIVE
Ketones, UA: NEGATIVE
LEUKOCYTES UA: NEGATIVE
NITRITE UA: NEGATIVE
PROTEIN UA: NEGATIVE

## 2016-12-06 NOTE — Patient Instructions (Addendum)
Call us if top blood pressure is >=160 or bottom number is >= 110  For Headaches:   Stay well hydrated, drink enough water so that your urine is clear, sometimes if you are dehydrated you can get headaches  Eat small frequent meals and snacks, sometimes if you are hungry you can get headaches  Sometimes you get headaches during pregnancy from the pregnancy hormones  You can try tylenol (1-2 regular strength 325mg  or 1-2 extra strength 500mg ) as directed on the box. The least amount of medication that works is best.   Cool compresses (cool wet washcloth or ice pack) to area of head that is hurting  You can also try drinking a caffeinated drink to see if this will help  If not helping, try below:  For Prevention of Headaches/Migraines:  CoQ10 100mg  three times daily  Vitamin B2 400mg  daily  Magnesium Oxide 400-600mg  daily  If You Get a Bad Headache/Migraine:  Benadryl 25mg    Magnesium Oxide  1 large Gatorade  2 extra strength Tylenol (1,000mg  total)  1 cup coffee or Coke  If this doesn't help please call us @ [phone]   Second Trimester of Pregnancy The second trimester is from week 13 through week 28 (months 4 through 6). The second trimester is often a time when you feel your best. Your body has also adjusted to being pregnant, and you begin to feel better physically. Usually, morning sickness has lessened or quit completely, you may have more energy, and you may have an increase in appetite. The second trimester is also a time when the fetus is growing rapidly. At the end of the sixth month, the fetus is about 9 inches long and weighs about 1 pounds. You will likely begin to feel the baby move (quickening) between 18 and 20 weeks of the pregnancy. Body changes during your second trimester Your body continues to go through many changes during your second trimester. The changes vary from woman to woman.  Your weight will continue to increase. You will notice your  lower abdomen bulging out.  You may begin to get stretch marks on your hips, abdomen, and breasts.  You may develop headaches that can be relieved by medicines. The medicines should be approved by your health care provider.  You may urinate more often because the fetus is pressing on your bladder.  You may develop or continue to have heartburn as a result of your pregnancy.  You may develop constipation because certain hormones are causing the muscles that push waste through your intestines to slow down.  You may develop hemorrhoids or swollen, bulging veins (varicose veins).  You may have back pain. This is caused by:  Weight gain.  Pregnancy hormones that are relaxing the joints in your pelvis.  A shift in weight and the muscles that support your balance.  Your breasts will continue to grow and they will continue to become tender.  Your gums may bleed and may be sensitive to brushing and flossing.  Dark spots or blotches (chloasma, mask of pregnancy) may develop on your face. This will likely fade after the baby is born.  A dark line from your belly button to the pubic area (linea nigra) may appear. This will likely fade after the baby is born.  You may have changes in your hair. These can include thickening of your hair, rapid growth, and changes in texture. Some women also have hair loss during or after pregnancy, or hair that feels dry or thin. Your  hair will most likely return to normal after your baby is born. What to expect at prenatal visits During a routine prenatal visit:  You will be weighed to make sure you and the fetus are growing normally.  Your blood pressure will be taken.  Your abdomen will be measured to track your baby's growth.  The fetal heartbeat will be listened to.  Any test results from the previous visit will be discussed. Your health care provider may ask you:  How you are feeling.  If you are feeling the baby move.  If you have had any  abnormal symptoms, such as leaking fluid, bleeding, severe headaches, or abdominal cramping.  If you are using any tobacco products, including cigarettes, chewing tobacco, and electronic cigarettes.  If you have any questions. Other tests that may be performed during your second trimester include:  Blood tests that check for:  Low iron levels (anemia).  Gestational diabetes (between 24 and 28 weeks).  Rh antibodies. This is to check for a protein on red blood cells (Rh factor).  Urine tests to check for infections, diabetes, or protein in the urine.  An ultrasound to confirm the proper growth and development of the baby.  An amniocentesis to check for possible genetic problems.  Fetal screens for spina bifida and Down syndrome.  HIV (human immunodeficiency virus) testing. Routine prenatal testing includes screening for HIV, unless you choose not to have this test. Follow these instructions at home: Eating and drinking  Continue to eat regular, healthy meals.  Avoid raw meat, uncooked cheese, cat litter boxes, and soil used by cats. These carry germs that can cause birth defects in the baby.  Take your prenatal vitamins.  Take 1500-2000 mg of calcium daily starting at the 20th week of pregnancy until you deliver your baby.  If you develop constipation:  Take over-the-counter or prescription medicines.  Drink enough fluid to keep your urine clear or pale yellow.  Eat foods that are high in fiber, such as fresh fruits and vegetables, whole grains, and beans.  Limit foods that are high in fat and processed sugars, such as fried and sweet foods. Activity  Exercise only as directed by your health care provider. Experiencing uterine cramps is a good sign to stop exercising.  Avoid heavy lifting, wear low heel shoes, and practice good posture.  Wear your seat belt at all times when driving.  Rest with your legs elevated if you have leg cramps or low back pain.  Wear a  good support bra for breast tenderness.  Do not use hot tubs, steam rooms, or saunas. Lifestyle  Avoid all smoking, herbs, alcohol, and unprescribed drugs. These chemicals affect the formation and growth of the baby.  Do not use any products that contain nicotine or tobacco, such as cigarettes and e-cigarettes. If you need help quitting, ask your health care provider.  A sexual relationship may be continued unless your health care provider directs you otherwise. General instructions  Follow your health care provider's instructions regarding medicine use. There are medicines that are either safe or unsafe to take during pregnancy.  Take warm sitz baths to soothe any pain or discomfort caused by hemorrhoids. Use hemorrhoid cream if your health care provider approves.  If you develop varicose veins, wear support hose. Elevate your feet for 15 minutes, 3-4 times a day. Limit salt in your diet.  Visit your dentist if you have not gone yet during your pregnancy. Use a soft toothbrush to brush your teeth  and be gentle when you floss.  Keep all follow-up prenatal visits as told by your health care provider. This is important. Contact a health care provider if:  You have dizziness.  You have mild pelvic cramps, pelvic pressure, or nagging pain in the abdominal area.  You have persistent nausea, vomiting, or diarrhea.  You have a bad smelling vaginal discharge.  You have pain with urination. Get help right away if:  You have a fever.  You are leaking fluid from your vagina.  You have spotting or bleeding from your vagina.  You have severe abdominal cramping or pain.  You have rapid weight gain or weight loss.  You have shortness of breath with chest pain.  You notice sudden or extreme swelling of your face, hands, ankles, feet, or legs.  You have not felt your baby move in over an hour.  You have severe headaches that do not go away with medicine.  You have vision  changes. Summary  The second trimester is from week 13 through week 28 (months 4 through 6). It is also a time when the fetus is growing rapidly.  Your body goes through many changes during pregnancy. The changes vary from woman to woman.  Avoid all smoking, herbs, alcohol, and unprescribed drugs. These chemicals affect the formation and growth your baby.  Do not use any tobacco products, such as cigarettes, chewing tobacco, and e-cigarettes. If you need help quitting, ask your health care provider.  Contact your health care provider if you have any questions. Keep all prenatal visits as told by your health care provider. This is important. This information is not intended to replace advice given to you by your health care provider. Make sure you discuss any questions you have with your health care provider. Document Released: 11/02/2001 Document Revised: 04/15/2016 Document Reviewed: 01/09/2013 Elsevier Interactive Patient Education  2017 [employer].

## 2016-12-06 NOTE — Progress Notes (Signed)
US 20+5 wks,cx 4.2 cm,ant pl gr 0,normal ov's bilat,svp of fluid 6 cm,fhr 134 bpm,EFW 406 g,anatomy complete,no obvious abnormalities seen

## 2016-12-06 NOTE — Progress Notes (Signed)
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  Reports good fm. Denies regular uc's, lof, vb, uti s/s. No complaints.  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

## 2016-12-13 ENCOUNTER — Telehealth: Payer: Self-pay | Admitting: Women's Health

## 2016-12-13 NOTE — Telephone Encounter (Signed)
Pt called stating that she would like to speak with a nurse, Pt did not state the reason why. Please contact pt °

## 2016-12-13 NOTE — Telephone Encounter (Signed)
Called pt back after speaking with Joellyn HaffKim Booker, CNM and informed pt to wait until after [redacted] wks gestation to drink the red raspberry tea.  Selena BattenKim will do more research on use earlier in pregnancy but until then hold off.  Pt verbalized understanding.

## 2016-12-13 NOTE — Telephone Encounter (Signed)
Pt states she read that drinking red raspberry tea can help have an easier labor and wanted to know if it was OK to drink now and closer to her due date.  Informed pt herbal tea is fine, make sure it is decaffeinated.  Pt verbalized understanding.

## 2017-01-03 ENCOUNTER — Encounter: Payer: Self-pay | Admitting: Women's Health

## 2017-01-03 ENCOUNTER — Ambulatory Visit (INDEPENDENT_AMBULATORY_CARE_PROVIDER_SITE_OTHER): Payer: 59 | Admitting: Women's Health

## 2017-01-03 VITALS — BP 132/78 | HR 72 | Wt 287.0 lb

## 2017-01-03 DIAGNOSIS — Z3A25 25 weeks gestation of pregnancy: Secondary | ICD-10-CM

## 2017-01-03 DIAGNOSIS — Z331 Pregnant state, incidental: Secondary | ICD-10-CM

## 2017-01-03 DIAGNOSIS — Z1389 Encounter for screening for other disorder: Secondary | ICD-10-CM

## 2017-01-03 DIAGNOSIS — O0992 Supervision of high risk pregnancy, unspecified, second trimester: Secondary | ICD-10-CM

## 2017-01-03 DIAGNOSIS — O10919 Unspecified pre-existing hypertension complicating pregnancy, unspecified trimester: Secondary | ICD-10-CM

## 2017-01-03 DIAGNOSIS — O10912 Unspecified pre-existing hypertension complicating pregnancy, second trimester: Secondary | ICD-10-CM

## 2017-01-03 DIAGNOSIS — O99212 Obesity complicating pregnancy, second trimester: Secondary | ICD-10-CM

## 2017-01-03 LAB — POCT URINALYSIS DIPSTICK
Blood, UA: NEGATIVE
Glucose, UA: NEGATIVE
Ketones, UA: NEGATIVE
LEUKOCYTES UA: NEGATIVE
Nitrite, UA: NEGATIVE
PROTEIN UA: NEGATIVE

## 2017-01-03 NOTE — Progress Notes (Signed)
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  Reports good fm. Denies regular uc's, lof, vb, uti s/s. No complaints.  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

## 2017-01-03 NOTE — Patient Instructions (Signed)
You will have your sugar test next visit.  Please do not eat or drink anything after midnight the night before you come, not even water.  You will be here for at least two hours.     Call the office ([phone]) or go to [facility] if:  You begin to have strong, frequent contractions  Your water breaks.  Sometimes it is a big gush of fluid, sometimes it is just a trickle that keeps getting your panties wet or running down your legs  You have vaginal bleeding.  It is normal to have a small amount of spotting if your cervix was checked.   You don't feel your baby moving like normal.  If you don't, get you something to eat and drink and lay down and focus on feeling your baby move.   If your baby is still not moving like normal, you should call the office or go to [facility].  Second Trimester of Pregnancy The second trimester is from week 13 through week 28, months 4 through 6. The second trimester is often a time when you feel your best. Your body has also adjusted to being pregnant, and you begin to feel better physically. Usually, morning sickness has lessened or quit completely, you may have more energy, and you may have an increase in appetite. The second trimester is also a time when the fetus is growing rapidly. At the end of the sixth month, the fetus is about 9 inches long and weighs about 1 pounds. You will likely begin to feel the baby move (quickening) between 18 and 20 weeks of the pregnancy. BODY CHANGES Your body goes through many changes during pregnancy. The changes vary from woman to woman.   Your weight will continue to increase. You will notice your lower abdomen bulging out.  You may begin to get stretch marks on your hips, abdomen, and breasts.  You may develop headaches that can be relieved by medicines approved by your health care provider.  You may urinate more often because the fetus is pressing on your bladder.  You may develop or continue to have  heartburn as a result of your pregnancy.  You may develop constipation because certain hormones are causing the muscles that push waste through your intestines to slow down.  You may develop hemorrhoids or swollen, bulging veins (varicose veins).  You may have back pain because of the weight gain and pregnancy hormones relaxing your joints between the bones in your pelvis and as a result of a shift in weight and the muscles that support your balance.  Your breasts will continue to grow and be tender.  Your gums may bleed and may be sensitive to brushing and flossing.  Dark spots or blotches (chloasma, mask of pregnancy) may develop on your face. This will likely fade after the baby is born.  A dark line from your belly button to the pubic area (linea nigra) may appear. This will likely fade after the baby is born.  You may have changes in your hair. These can include thickening of your hair, rapid growth, and changes in texture. Some women also have hair loss during or after pregnancy, or hair that feels dry or thin. Your hair will most likely return to normal after your baby is born. WHAT TO EXPECT AT YOUR PRENATAL VISITS During a routine prenatal visit:  You will be weighed to make sure you and the fetus are growing normally.  Your blood pressure will be taken.    Your abdomen will be measured to track your baby's growth.  The fetal heartbeat will be listened to.  Any test results from the previous visit will be discussed. Your health care provider may ask you:  How you are feeling.  If you are feeling the baby move.  If you have had any abnormal symptoms, such as leaking fluid, bleeding, severe headaches, or abdominal cramping.  If you have any questions. Other tests that may be performed during your second trimester include:  Blood tests that check for:  Low iron levels (anemia).  Gestational diabetes (between 24 and 28 weeks).  Rh antibodies.  Urine tests to check  for infections, diabetes, or protein in the urine.  An ultrasound to confirm the proper growth and development of the baby.  An amniocentesis to check for possible genetic problems.  Fetal screens for spina bifida and Down syndrome. HOME CARE INSTRUCTIONS   Avoid all smoking, herbs, alcohol, and unprescribed drugs. These chemicals affect the formation and growth of the baby.  Follow your health care provider's instructions regarding medicine use. There are medicines that are either safe or unsafe to take during pregnancy.  Exercise only as directed by your health care provider. Experiencing uterine cramps is a good sign to stop exercising.  Continue to eat regular, healthy meals.  Wear a good support bra for breast tenderness.  Do not use hot tubs, steam rooms, or saunas.  Wear your seat belt at all times when driving.  Avoid raw meat, uncooked cheese, cat litter boxes, and soil used by cats. These carry germs that can cause birth defects in the baby.  Take your prenatal vitamins.  Try taking a stool softener (if your health care provider approves) if you develop constipation. Eat more high-fiber foods, such as fresh vegetables or fruit and whole grains. Drink plenty of fluids to keep your urine clear or pale yellow.  Take warm sitz baths to soothe any pain or discomfort caused by hemorrhoids. Use hemorrhoid cream if your health care provider approves.  If you develop varicose veins, wear support hose. Elevate your feet for 15 minutes, 3-4 times a day. Limit salt in your diet.  Avoid heavy lifting, wear low heel shoes, and practice good posture.  Rest with your legs elevated if you have leg cramps or low back pain.  Visit your dentist if you have not gone yet during your pregnancy. Use a soft toothbrush to brush your teeth and be gentle when you floss.  A sexual relationship may be continued unless your health care provider directs you otherwise.  Continue to go to all your  prenatal visits as directed by your health care provider. SEEK MEDICAL CARE IF:   You have dizziness.  You have mild pelvic cramps, pelvic pressure, or nagging pain in the abdominal area.  You have persistent nausea, vomiting, or diarrhea.  You have a bad smelling vaginal discharge.  You have pain with urination. SEEK IMMEDIATE MEDICAL CARE IF:   You have a fever.  You are leaking fluid from your vagina.  You have spotting or bleeding from your vagina.  You have severe abdominal cramping or pain.  You have rapid weight gain or loss.  You have shortness of breath with chest pain.  You notice sudden or extreme swelling of your face, hands, ankles, feet, or legs.  You have not felt your baby move in over an hour.  You have severe headaches that do not go away with medicine.  You have vision changes.   Document Released: 11/02/2001 Document Revised: 11/13/2013 Document Reviewed: 01/09/2013 Exit[employer] Patient Information 2015 [employer]. This information is not intended to replace advice given to you by your health care provider. Make sure you discuss any questions you have with your health care provider.     

## 2017-01-25 ENCOUNTER — Other Ambulatory Visit: Payer: 59

## 2017-01-25 ENCOUNTER — Ambulatory Visit (INDEPENDENT_AMBULATORY_CARE_PROVIDER_SITE_OTHER): Payer: 59 | Admitting: Obstetrics & Gynecology

## 2017-01-25 ENCOUNTER — Ambulatory Visit (INDEPENDENT_AMBULATORY_CARE_PROVIDER_SITE_OTHER): Payer: 59

## 2017-01-25 ENCOUNTER — Encounter: Payer: Self-pay | Admitting: Obstetrics & Gynecology

## 2017-01-25 VITALS — BP 126/74 | HR 106 | Wt 285.0 lb

## 2017-01-25 DIAGNOSIS — O10913 Unspecified pre-existing hypertension complicating pregnancy, third trimester: Secondary | ICD-10-CM

## 2017-01-25 DIAGNOSIS — O10919 Unspecified pre-existing hypertension complicating pregnancy, unspecified trimester: Secondary | ICD-10-CM

## 2017-01-25 DIAGNOSIS — O0993 Supervision of high risk pregnancy, unspecified, third trimester: Secondary | ICD-10-CM

## 2017-01-25 DIAGNOSIS — O0992 Supervision of high risk pregnancy, unspecified, second trimester: Secondary | ICD-10-CM

## 2017-01-25 DIAGNOSIS — Z131 Encounter for screening for diabetes mellitus: Secondary | ICD-10-CM | POA: Diagnosis not present

## 2017-01-25 DIAGNOSIS — Z3A28 28 weeks gestation of pregnancy: Secondary | ICD-10-CM

## 2017-01-25 DIAGNOSIS — O0990 Supervision of high risk pregnancy, unspecified, unspecified trimester: Secondary | ICD-10-CM

## 2017-01-25 DIAGNOSIS — Z1389 Encounter for screening for other disorder: Secondary | ICD-10-CM

## 2017-01-25 DIAGNOSIS — Z331 Pregnant state, incidental: Secondary | ICD-10-CM

## 2017-01-25 LAB — POCT URINALYSIS DIPSTICK
GLUCOSE UA: NEGATIVE
Leukocytes, UA: NEGATIVE
NITRITE UA: NEGATIVE
RBC UA: NEGATIVE

## 2017-01-25 NOTE — Progress Notes (Signed)
Fetal Surveillance Testing today:  FHR 147   High Risk Pregnancy Diagnosis(es):   CHTN  G3P1011 4264w6d Estimated Date of Delivery: 04/20/17  Blood pressure 126/74, pulse (!) 106, weight 285 lb (129.3 kg), last menstrual period 07/22/2016, unknown if currently breastfeeding.  Urinalysis: Negative   HPI: The patient is being seen today for ongoing management of CHTN. Today she reports glute pain and pelvic pressure exercises reviewed   BP weight and urine results all reviewed and noted. Patient reports good fetal movement, denies any bleeding and no rupture of membranes symptoms or regular contractions.  Fundal Height:  U+18/29 Fetal Heart rate:  147 Edema:  none  Patient is without complaints other than noted in her HPI. All questions were answered.  All lab and sonogram results have been reviewed. Comments:    Assessment:  1.  Pregnancy at 4864w6d,  Estimated Date of Delivery: 04/20/17 :                          2.  CHTN                        3.    Medication(s) Plans:  Labetalol 200 BID  Treatment Plan:  Twice weekly surveillance at 32 weeks, sonogram alternating with NST, induction at 39 weeks or as clinically indicated   Return in about 2 weeks (around 02/08/2017) for HROB. for appointment for high risk OB care  No orders of the defined types were placed in this encounter.  Orders Placed This Encounter  Procedures  . POCT urinalysis dipstick

## 2017-01-25 NOTE — Progress Notes (Signed)
US 27+6 wks,cephalic,cx 5.3 cm,normal ov's bilat,AFI 20 cm,ant pl gr 2,fhr 142 bpm,efw 1253 g 63%

## 2017-01-26 LAB — GLUCOSE TOLERANCE, 2 HOURS W/ 1HR
GLUCOSE, FASTING: 77 mg/dL (ref 65–91)
Glucose, 1 hour: 95 mg/dL (ref 65–179)
Glucose, 2 hour: 96 mg/dL (ref 65–152)

## 2017-01-26 LAB — CBC
Hematocrit: 33.3 % — ABNORMAL LOW (ref 34.0–46.6)
Hemoglobin: 11 g/dL — ABNORMAL LOW (ref 11.1–15.9)
MCH: 27 pg (ref 26.6–33.0)
MCHC: 33 g/dL (ref 31.5–35.7)
MCV: 82 fL (ref 79–97)
PLATELETS: 282 10*3/uL (ref 150–379)
RBC: 4.08 x10E6/uL (ref 3.77–5.28)
RDW: 14.6 % (ref 12.3–15.4)
WBC: 9.7 10*3/uL (ref 3.4–10.8)

## 2017-01-26 LAB — HIV ANTIBODY (ROUTINE TESTING W REFLEX): HIV Screen 4th Generation wRfx: NONREACTIVE

## 2017-01-26 LAB — ANTIBODY SCREEN: ANTIBODY SCREEN: NEGATIVE

## 2017-01-26 LAB — RPR: RPR Ser Ql: NONREACTIVE

## 2017-02-08 ENCOUNTER — Encounter: Payer: 59 | Admitting: Women's Health

## 2017-02-11 ENCOUNTER — Inpatient Hospital Stay (HOSPITAL_COMMUNITY)
Admission: AD | Admit: 2017-02-11 | Discharge: 2017-02-11 | Disposition: A | Discharge status: Home / Self Care | Payer: 59 | Source: Ambulatory Visit | Attending: Obstetrics & Gynecology | Admitting: Obstetrics & Gynecology

## 2017-02-11 ENCOUNTER — Encounter (HOSPITAL_COMMUNITY): Payer: Self-pay

## 2017-02-11 DIAGNOSIS — A084 Viral intestinal infection, unspecified: Secondary | ICD-10-CM

## 2017-02-11 DIAGNOSIS — Z880 Allergy status to penicillin: Secondary | ICD-10-CM | POA: Diagnosis not present

## 2017-02-11 DIAGNOSIS — O9989 Other specified diseases and conditions complicating pregnancy, childbirth and the puerperium: Secondary | ICD-10-CM | POA: Diagnosis not present

## 2017-02-11 DIAGNOSIS — Z7982 Long term (current) use of aspirin: Secondary | ICD-10-CM | POA: Diagnosis not present

## 2017-02-11 DIAGNOSIS — Z3A30 30 weeks gestation of pregnancy: Secondary | ICD-10-CM | POA: Diagnosis not present

## 2017-02-11 DIAGNOSIS — O98513 Other viral diseases complicating pregnancy, third trimester: Secondary | ICD-10-CM | POA: Insufficient documentation

## 2017-02-11 DIAGNOSIS — O212 Late vomiting of pregnancy: Secondary | ICD-10-CM | POA: Diagnosis present

## 2017-02-11 HISTORY — DX: Gestational (pregnancy-induced) hypertension without significant proteinuria, unspecified trimester: O13.9

## 2017-02-11 LAB — CBC WITH DIFFERENTIAL/PLATELET
Basophils Absolute: 0 10*3/uL (ref 0.0–0.1)
Basophils Relative: 0 %
EOS ABS: 0 10*3/uL (ref 0.0–0.7)
Eosinophils Relative: 0 %
HCT: 36.6 % (ref 36.0–46.0)
HEMOGLOBIN: 12.2 g/dL (ref 12.0–15.0)
LYMPHS ABS: 0.4 10*3/uL — AB (ref 0.7–4.0)
Lymphocytes Relative: 5 %
MCH: 26.8 pg (ref 26.0–34.0)
MCHC: 33.3 g/dL (ref 30.0–36.0)
MCV: 80.4 fL (ref 78.0–100.0)
MONOS PCT: 4 %
Monocytes Absolute: 0.3 10*3/uL (ref 0.1–1.0)
NEUTROS PCT: 91 %
Neutro Abs: 8.1 10*3/uL — ABNORMAL HIGH (ref 1.7–7.7)
Platelets: 264 10*3/uL (ref 150–400)
RBC: 4.55 MIL/uL (ref 3.87–5.11)
RDW: 14.2 % (ref 11.5–15.5)
WBC: 8.8 10*3/uL (ref 4.0–10.5)

## 2017-02-11 LAB — GASTROINTESTINAL PANEL BY PCR, STOOL (REPLACES STOOL CULTURE)
ADENOVIRUS F40/41: NOT DETECTED
Astrovirus: NOT DETECTED
CAMPYLOBACTER SPECIES: NOT DETECTED
Cryptosporidium: NOT DETECTED
Cyclospora cayetanensis: NOT DETECTED
ENTEROAGGREGATIVE E COLI (EAEC): NOT DETECTED
Entamoeba histolytica: NOT DETECTED
Enteropathogenic E coli (EPEC): NOT DETECTED
Enterotoxigenic E coli (ETEC): NOT DETECTED
Giardia lamblia: NOT DETECTED
NOROVIRUS GI/GII: DETECTED — AB
PLESIMONAS SHIGELLOIDES: NOT DETECTED
ROTAVIRUS A: NOT DETECTED
SALMONELLA SPECIES: NOT DETECTED
SHIGELLA/ENTEROINVASIVE E COLI (EIEC): NOT DETECTED
Sapovirus (I, II, IV, and V): NOT DETECTED
Shiga like toxin producing E coli (STEC): NOT DETECTED
Vibrio cholerae: NOT DETECTED
Vibrio species: NOT DETECTED
Yersinia enterocolitica: NOT DETECTED

## 2017-02-11 LAB — COMPREHENSIVE METABOLIC PANEL
ALT: 15 U/L (ref 14–54)
ANION GAP: 5 (ref 5–15)
AST: 20 U/L (ref 15–41)
Albumin: 3.1 g/dL — ABNORMAL LOW (ref 3.5–5.0)
Alkaline Phosphatase: 92 U/L (ref 38–126)
BILIRUBIN TOTAL: 0.6 mg/dL (ref 0.3–1.2)
BUN: 8 mg/dL (ref 6–20)
CHLORIDE: 103 mmol/L (ref 101–111)
CO2: 24 mmol/L (ref 22–32)
Calcium: 8.8 mg/dL — ABNORMAL LOW (ref 8.9–10.3)
Creatinine, Ser: 0.49 mg/dL (ref 0.44–1.00)
Glucose, Bld: 94 mg/dL (ref 65–99)
Potassium: 4.3 mmol/L (ref 3.5–5.1)
Sodium: 132 mmol/L — ABNORMAL LOW (ref 135–145)
TOTAL PROTEIN: 6.9 g/dL (ref 6.5–8.1)

## 2017-02-11 LAB — URINALYSIS, ROUTINE W REFLEX MICROSCOPIC
BILIRUBIN URINE: NEGATIVE
Glucose, UA: NEGATIVE mg/dL
HGB URINE DIPSTICK: NEGATIVE
Ketones, ur: 80 mg/dL — AB
LEUKOCYTES UA: NEGATIVE
NITRITE: NEGATIVE
PH: 6 (ref 5.0–8.0)
Protein, ur: 100 mg/dL — AB
SPECIFIC GRAVITY, URINE: 1.024 (ref 1.005–1.030)

## 2017-02-11 LAB — LIPASE, BLOOD: LIPASE: 14 U/L (ref 11–51)

## 2017-02-11 MED ORDER — ONDANSETRON 4 MG PO TBDP: 4.0000 mg | ORAL_TABLET | Freq: Three times a day (TID) | ORAL | 0 refills | Status: DC | PRN

## 2017-02-11 MED ORDER — ONDANSETRON HCL 4 MG/2ML IJ SOLN: 4.0000 mg | Freq: Once | INTRAMUSCULAR | Status: DC

## 2017-02-11 MED ORDER — LACTATED RINGERS IV BOLUS (SEPSIS)
1000.0000 mL | Freq: Once | INTRAVENOUS | Status: AC
  Administered 2017-02-11: 1000 mL via INTRAVENOUS

## 2017-02-11 MED ORDER — SODIUM CHLORIDE 0.9 % IV SOLN
8.0000 mg | Freq: Once | INTRAVENOUS | Status: AC
  Administered 2017-02-11: 8 mg via INTRAVENOUS
  Filled 2017-02-11: qty 4

## 2017-02-11 NOTE — MAU Note (Signed)
Pt states she has had a child with a virus and she started getting sick last PM with severe nausea and vomiting, with episodes of diarrhea. Started having abdominal cramping this am.

## 2017-02-11 NOTE — MAU Provider Note (Signed)
Chief Complaint:  Nausea; Emesis; and Diarrhea   First Provider Initiated Contact with Patient 02/11/17 1017      HPI: Patricia [name] is a 26 y.o. G3P1011 at [redacted]w[redacted]d who presents to maternity admissions reporting intractable N/V/D.  Patient reports starting last night at 8 PM she has been having intractable nausea, vomiting, and loose watery stools. She states that she cannot hold down any fluids. Every time she has an episode of emesis she also has a loose stool. Denies any severe abdominal pain. Denies any blood in her emesis or stools. States that her son just started daycare, and came home a few days ago with same symptoms. She denies any fevers or chills, chest pain or shortness of breath. She is feeling baby move.  Denies contractions, leakage of fluid or vaginal bleeding. Good fetal movement.   Pregnancy Course:   Past Medical History: Past Medical History:  Diagnosis Date  . Cough 08/01/2015  . Migraine   . Migraines   . Nausea and vomiting during pregnancy prior to [redacted] weeks gestation 08/01/2015  . PCO (polycystic ovaries) 03/08/2014  . Polycystic disease, ovaries   . Pregnancy induced hypertension   . Pregnant 05/27/2015  . URI (upper respiratory infection) 08/01/2015    Past obstetric history: OB History  Gravida Para Term Preterm AB Living  3 1 1   1 1   SAB TAB Ectopic Multiple Live Births  1     0 1    # Outcome Date GA Lbr Len/2nd Weight Sex Delivery Anes PTL Lv  3 Current           2 Term 01/02/16 [redacted]w[redacted]d 31:10 7 lb (3.175 kg) M Vag-Spont EPI N LIV  1 SAB               Past Surgical History: Past Surgical History:  Procedure Laterality Date  . TONSILLECTOMY    . WISDOM TOOTH EXTRACTION       Family History: Family History  Problem Relation Age of Onset  . Migraines Sister   . Hypertension Brother   . Heart disease Maternal Grandmother   . Cancer Maternal Grandfather     prostate  . Stroke Paternal Grandmother   . Cancer Paternal Grandfather     prostate  .  Stroke Paternal Grandfather   . Atrial fibrillation Father     Social History: Social History  Substance Use Topics  . Smoking status: Never Smoker  . Smokeless tobacco: Never Used  . Alcohol use No     Comment: occassional    Allergies:  Allergies  Allergen Reactions  . Peanut-Containing Drug Products Anaphylaxis and Hives  . Propranolol Shortness Of Breath  . Cephalexin Hives    Keflex. Pt states she can tolerate Amoxicillin  . Imitrex [Sumatriptan] Hives  . Cephalexin Rash    Meds:  No prescriptions prior to admission.    I have reviewed patient's Past Medical Hx, Surgical Hx, Family Hx, Social Hx, medications and allergies.   ROS:  A comprehensive ROS was negative except per HPI.    Physical Exam   Patient Vitals for the past 24 hrs:  BP Temp Temp src Pulse Resp SpO2 Height Weight  02/11/17 1308 (!) 119/44 98.7 F (37.1 C) - (!) 101 20 100 % - -  02/11/17 0926 135/82 98.6 F (37 C) Oral (!) 117 18 98 % - -  02/11/17 0905 - - - - - - 5\' 5"  (1.651 m) 286 lb (129.7 kg)   Constitutional: Well-developed,  well-nourished morbidly obese female in no acute distress, mildly ill appearing.  Cardiovascular: Mildly tachycardic, normal rhythm, no murmurs Respiratory: normal effort, CTAB GI: Abd soft, non-tender, gravid appropriate for gestational age. Pos BS x 4 MS: Extremities nontender, no edema, normal ROM Neurologic: Alert and oriented x 4.  GU: Neg CVAT.    Labs: Results for orders placed or performed during the hospital encounter of 02/11/17 (from the past 24 hour(s))  Urinalysis, Routine w reflex microscopic     Status: Abnormal   Collection Time: 02/11/17  9:01 AM  Result Value Ref Range   Color, Urine YELLOW YELLOW   APPearance HAZY (A) CLEAR   Specific Gravity, Urine 1.024 1.005 - 1.030   pH 6.0 5.0 - 8.0   Glucose, UA NEGATIVE NEGATIVE mg/dL   Hgb urine dipstick NEGATIVE NEGATIVE   Bilirubin Urine NEGATIVE NEGATIVE   Ketones, ur 80 (A) NEGATIVE mg/dL    Protein, ur 161 (A) NEGATIVE mg/dL   Nitrite NEGATIVE NEGATIVE   Leukocytes, UA NEGATIVE NEGATIVE   RBC / HPF 0-5 0 - 5 RBC/hpf   WBC, UA 0-5 0 - 5 WBC/hpf   Bacteria, UA MANY (A) NONE SEEN   Squamous Epithelial / LPF 6-30 (A) NONE SEEN   Mucous PRESENT   CBC with Differential/Platelet     Status: Abnormal   Collection Time: 02/11/17 10:55 AM  Result Value Ref Range   WBC 8.8 4.0 - 10.5 K/uL   RBC 4.55 3.87 - 5.11 MIL/uL   Hemoglobin 12.2 12.0 - 15.0 g/dL   HCT 09.6 04.5 - 40.9 %   MCV 80.4 78.0 - 100.0 fL   MCH 26.8 26.0 - 34.0 pg   MCHC 33.3 30.0 - 36.0 g/dL   RDW 81.1 91.4 - 78.2 %   Platelets 264 150 - 400 K/uL   Neutrophils Relative % 91 %   Neutro Abs 8.1 (H) 1.7 - 7.7 K/uL   Lymphocytes Relative 5 %   Lymphs Abs 0.4 (L) 0.7 - 4.0 K/uL   Monocytes Relative 4 %   Monocytes Absolute 0.3 0.1 - 1.0 K/uL   Eosinophils Relative 0 %   Eosinophils Absolute 0.0 0.0 - 0.7 K/uL   Basophils Relative 0 %   Basophils Absolute 0.0 0.0 - 0.1 K/uL  Comprehensive metabolic panel     Status: Abnormal   Collection Time: 02/11/17 10:55 AM  Result Value Ref Range   Sodium 132 (L) 135 - 145 mmol/L   Potassium 4.3 3.5 - 5.1 mmol/L   Chloride 103 101 - 111 mmol/L   CO2 24 22 - 32 mmol/L   Glucose, Bld 94 65 - 99 mg/dL   BUN 8 6 - 20 mg/dL   Creatinine, Ser 9.56 0.44 - 1.00 mg/dL   Calcium 8.8 (L) 8.9 - 10.3 mg/dL   Total Protein 6.9 6.5 - 8.1 g/dL   Albumin 3.1 (L) 3.5 - 5.0 g/dL   AST 20 15 - 41 U/L   ALT 15 14 - 54 U/L   Alkaline Phosphatase 92 38 - 126 U/L   Total Bilirubin 0.6 0.3 - 1.2 mg/dL   GFR calc non Af Amer >60 >60 mL/min   GFR calc Af Amer >60 >60 mL/min   Anion gap 5 5 - 15  Lipase, blood     Status: None   Collection Time: 02/11/17 10:55 AM  Result Value Ref Range   Lipase 14 11 - 51 U/L    Imaging:  US Ob Follow Up  Result Date: 01/26/2017  FOLLOW UP SONOGRAM Patricia [name] is in the office for a follow up sonogram for EFW/AFI. She is a 26 y.o. year old  G44P1011 with Estimated Date of Delivery: 04/20/17 by early ultrasound now at  [redacted]w[redacted]d weeks gestation. Thus far the pregnancy has been complicated by CHTN,obesity., GESTATION: SINGLETON PRESENTATION: cephalic FETAL ACTIVITY:          Heart rate         142          The fetus is active. AMNIOTIC FLUID: The amniotic fluid volume is  normal, 20 cm. PLACENTA LOCALIZATION:  anterior GRADE 2 CERVIX: Measures 5.2 cm ADNEXA: The ovaries are normal. GESTATIONAL AGE AND  BIOMETRICS: Gestational criteria: Estimated Date of Delivery: 04/20/17 by early ultrasound now at [redacted]w[redacted]d Previous Scans:2          BIPARIETAL DIAMETER           7.07 cm         28+3 weeks HEAD CIRCUMFERENCE           25.48 cm         27+5 weeks ABDOMINAL CIRCUMFERENCE           24.01 cm         28+2 weeks FEMUR LENGTH           5.38 cm         28+4 weeks                                                       AVERAGE EGA(BY THIS SCAN):  28+2 weeks                                                 ESTIMATED FETAL WEIGHT:       1253  grams, 63 % ANATOMICAL SURVEY                                                                            COMMENTS CEREBRAL VENTRICLES yes normal  CHOROID PLEXUS yes normal  CEREBELLUM yes normal  CISTERNA MAGNA yes normal  NUCHAL REGION yes normal      NASAL BONE yes normal  NOSE/LIP yes normal  FACIAL PROFILE yes normal  4 CHAMBERED HEART yes normal      DIAPHRAGM yes normal  STOMACH yes normal  RENAL REGION yes normal  BLADDER yes normal  CORD INSERTION yes normal  3 VESSEL CORD yes normal  SPINE yes normal  ARMS/HANDS yes normal  LEGS/FEET yes normal  GENITALIA yes normal female     SUSPECTED ABNORMALITIES:  no QUALITY OF SCAN: satisfactory TECHNICIAN COMMENTS: [place] 27+6 wks,cephalic,cx 5.3 cm,normal ov's bilat,AFI 20 cm,ant pl gr 2,fhr 142 bpm,efw 1253 g 63% A copy of this report including all images has been saved and backed up to a second source for retrieval if needed. All measures and details of the anatomical scan, placentation, fluid  volume and pelvic anatomy are contained in that report. Amber Flora LippsJ Carl 01/25/2017 10:47 AM Clinical Impression and recommendations: I have reviewed the sonogram results above, combined with the patient's current clinical course, below are my impressions and any appropriate recommendations for management based on the sonographic findings. 1.  [id number] Estimated Date of Delivery: 04/20/17 by serial sonographic evaluations 2.  Fetal sonographic surveillance findings: a). Borderline Normal fluid volume, re evlauate during [facility] b). Normal growth percentile with appropriate interval growth, 63% 3.  Normal general sonographic findings Recommend continued prenatal evaluations and care based on this sonogram and as clinically indicated from the patient's clinical course. EURE,LUTHER H 01/26/2017 2:07 PM    MAU Course: IVF bolus IV Zofran Stool cultures CBC/CMP/Lipase PO challenge  I personally reviewed the patient's NST today, found to be REACTIVE. 140 bpm, mod var, +accels (10x10 and some 15x15), no decels. CTX: None.   MDM: Plan of care reviewed with patient, including labs and tests ordered and medical treatment. Patient felt better after Zofran was given, able to tolerate PO intake. Stool cultures collected, will call patient with results. Supportive care reviewed with the patient, home with Zofran. Stable for discharge.   Assessment: 1. Viral enteritis     Plan: Discharge home in stable condition.  Preterm Labor precautions and fetal kick counts Supportive care Will follow up on stool cultures and contact patient if positive    Allergies as of 02/11/2017      Reactions   Peanut-containing Drug Products Anaphylaxis, Hives   Propranolol Shortness Of Breath   Cephalexin Hives   Keflex. Pt states she can tolerate Amoxicillin   Imitrex [sumatriptan] Hives   Cephalexin Rash      Medication List    STOP taking these medications   butalbital-acetaminophen-caffeine  50-325-40 MG tablet Commonly known as:  FIORICET, ESGIC     TAKE these medications   aspirin EC 81 MG tablet Take 81 mg by mouth daily.   COMPLETE NATAL DHA 29-1-200 & 250 MG Misc Take daily   Doxylamine-Pyridoxine 10-10 MG Tbec Commonly known as:  DICLEGIS 2 tabs q hs, if sx persist add 1 tab q am on day 3, if sx persist add 1 tab q afternoon on day 4   labetalol 200 MG tablet Commonly known as:  NORMODYNE Take 1 tablet (200 mg total) by mouth 2 (two) times daily.   omeprazole 10 MG capsule Commonly known as:  PRILOSEC Take 1 capsule (10 mg total) by mouth daily.   ondansetron 4 MG disintegrating tablet Commonly known as:  ZOFRAN ODT Take 1 tablet (4 mg total) by mouth every 8 (eight) hours as needed for nausea or vomiting.   PROAIR HFA 108 (90 Base) MCG/ACT inhaler Generic drug:  albuterol Inhale 2 puffs into the lungs every 4 (four) hours as needed for wheezing or shortness of breath.       Jen MowElizabeth Mumaw, DO OB Fellow [facility] 02/11/2017 1:31 PM

## 2017-02-11 NOTE — Progress Notes (Signed)
CRITICAL VALUE ALERT  Critical value received:  Positive Norovirus  Date of notification:  02/11/2017  Time of notification:  1700  Critical value read back:Yes.    Nurse who received alert:  K.Marzella Miracle,RN  MD notified (1st page):  Judeth HornErin Lawrence, NP  Time of first page:  On Unit

## 2017-02-17 ENCOUNTER — Ambulatory Visit (INDEPENDENT_AMBULATORY_CARE_PROVIDER_SITE_OTHER): Payer: 59 | Admitting: Women's Health

## 2017-02-17 ENCOUNTER — Encounter: Payer: Self-pay | Admitting: Women's Health

## 2017-02-17 VITALS — BP 120/60 | HR 74 | Wt 294.4 lb

## 2017-02-17 DIAGNOSIS — O10919 Unspecified pre-existing hypertension complicating pregnancy, unspecified trimester: Secondary | ICD-10-CM

## 2017-02-17 DIAGNOSIS — Z1389 Encounter for screening for other disorder: Secondary | ICD-10-CM

## 2017-02-17 DIAGNOSIS — O10913 Unspecified pre-existing hypertension complicating pregnancy, third trimester: Secondary | ICD-10-CM

## 2017-02-17 DIAGNOSIS — Z331 Pregnant state, incidental: Secondary | ICD-10-CM

## 2017-02-17 DIAGNOSIS — O0993 Supervision of high risk pregnancy, unspecified, third trimester: Secondary | ICD-10-CM

## 2017-02-17 LAB — POCT URINALYSIS DIPSTICK
Blood, UA: NEGATIVE
Glucose, UA: NEGATIVE
Ketones, UA: NEGATIVE
Leukocytes, UA: NEGATIVE
Nitrite, UA: NEGATIVE
PROTEIN UA: NEGATIVE

## 2017-02-17 NOTE — Progress Notes (Signed)
High Risk Pregnancy Diagnosis(es): CHTN G3P1011 4270w1d Estimated Date of Delivery: 04/20/17 BP 120/60   Pulse 74   Wt 294 lb 6.4 oz (133.5 kg)   LMP 07/22/2016 (Exact Date)   BMI 48.99 kg/m   Urinalysis: Negative HPI:  Doing well, no complaints BP, weight, and urine reviewed.  Reports good fm. Denies regular uc's, lof, vb, uti s/s.   Fundal Height:  38, U+21 Fetal Heart rate:  153 Edema:  trace  Reviewed ptl s/s, fkc, pn2 results All questions were answered Assessment: 9770w1d CHTN Medication(s) Plans:  Labetalol 200mg  bid  Treatment Plan: Growth u/s @ 34, 38wks     2x/wk testing nst/sono @ 32wks    Deliver @  39wks (meds) Follow up on Tues for high-risk OB appt and bpp/dopp u/s, begin 2x/wk testing (wants to do Tues/Fri)

## 2017-02-17 NOTE — Patient Instructions (Signed)
Call the office ([phone]) or go to [facility] if:  You begin to have strong, frequent contractions  Your water breaks.  Sometimes it is a big gush of fluid, sometimes it is just a trickle that keeps getting your panties wet or running down your legs  You have vaginal bleeding.  It is normal to have a small amount of spotting if your cervix was checked.   You don't feel your baby moving like normal.  If you don't, get you something to eat and drink and lay down and focus on feeling your baby move.  You should feel at least 10 movements in 2 hours.  If you don't, you should call the office or go to [facility].     Preterm Labor and Birth Information The normal length of a pregnancy is 39-41 weeks. Preterm labor is when labor starts before 37 completed weeks of pregnancy. What are the risk factors for preterm labor? Preterm labor is more likely to occur in women who:  Have certain infections during pregnancy such as a bladder infection, sexually transmitted infection, or infection inside the uterus (chorioamnionitis).  Have a shorter-than-normal cervix.  Have gone into preterm labor before.  Have had surgery on their cervix.  Are younger than age 17 or older than age 35.  Are African American.  Are pregnant with twins or multiple babies (multiple gestation).  Take street drugs or smoke while pregnant.  Do not gain enough weight while pregnant.  Became pregnant shortly after having been pregnant. What are the symptoms of preterm labor? Symptoms of preterm labor include:  Cramps similar to those that can happen during a menstrual period. The cramps may happen with diarrhea.  Pain in the abdomen or lower back.  Regular uterine contractions that may feel like tightening of the abdomen.  A feeling of increased pressure in the pelvis.  Increased watery or bloody mucus discharge from the vagina.  Water breaking (ruptured amniotic sac). Why is it important to  recognize signs of preterm labor? It is important to recognize signs of preterm labor because babies who are born prematurely may not be fully developed. This can put them at an increased risk for:  Long-term (chronic) heart and lung problems.  Difficulty immediately after birth with regulating body systems, including blood sugar, body temperature, heart rate, and breathing rate.  Bleeding in the brain.  Cerebral palsy.  Learning difficulties.  Death. These risks are highest for babies who are born before 34 weeks of pregnancy. How is preterm labor treated? Treatment depends on the length of your pregnancy, your condition, and the health of your baby. It may involve:  Having a stitch (suture) placed in your cervix to prevent your cervix from opening too early (cerclage).  Taking or being given medicines, such as:  Hormone medicines. These may be given early in pregnancy to help support the pregnancy.  Medicine to stop contractions.  Medicines to help mature the baby's lungs. These may be prescribed if the risk of delivery is high.  Medicines to prevent your baby from developing cerebral palsy. If the labor happens before 34 weeks of pregnancy, you may need to stay in the hospital. What should I do if I think I am in preterm labor? If you think that you are going into preterm labor, call your health care provider right away. How can I prevent preterm labor in future pregnancies? To increase your chance of having a full-term pregnancy:  Do not use any tobacco products, such   as cigarettes, chewing tobacco, and e-cigarettes. If you need help quitting, ask your health care provider.  Do not use street drugs or medicines that have not been prescribed to you during your pregnancy.  Talk with your health care provider before taking any herbal supplements, even if you have been taking them regularly.  Make sure you gain a healthy amount of weight during your pregnancy.  Watch for  infection. If you think that you might have an infection, get it checked right away.  Make sure to tell your health care provider if you have gone into preterm labor before. This information is not intended to replace advice given to you by your health care provider. Make sure you discuss any questions you have with your health care provider. Document Released: 01/29/2004 Document Revised: 04/20/2016 Document Reviewed: 03/31/2016 Elsevier Interactive Patient Education  2017 [employer]  

## 2017-02-19 ENCOUNTER — Other Ambulatory Visit: Payer: Self-pay | Admitting: Obstetrics and Gynecology

## 2017-02-22 ENCOUNTER — Ambulatory Visit (INDEPENDENT_AMBULATORY_CARE_PROVIDER_SITE_OTHER): Payer: 59

## 2017-02-22 ENCOUNTER — Ambulatory Visit (INDEPENDENT_AMBULATORY_CARE_PROVIDER_SITE_OTHER): Payer: 59 | Admitting: Women's Health

## 2017-02-22 ENCOUNTER — Encounter: Payer: Self-pay | Admitting: Women's Health

## 2017-02-22 ENCOUNTER — Other Ambulatory Visit: Payer: Self-pay | Admitting: Obstetrics and Gynecology

## 2017-02-22 VITALS — BP 126/78 | HR 76 | Wt 292.0 lb

## 2017-02-22 DIAGNOSIS — O0990 Supervision of high risk pregnancy, unspecified, unspecified trimester: Secondary | ICD-10-CM

## 2017-02-22 DIAGNOSIS — O10919 Unspecified pre-existing hypertension complicating pregnancy, unspecified trimester: Secondary | ICD-10-CM

## 2017-02-22 DIAGNOSIS — O0993 Supervision of high risk pregnancy, unspecified, third trimester: Secondary | ICD-10-CM

## 2017-02-22 DIAGNOSIS — Z331 Pregnant state, incidental: Secondary | ICD-10-CM

## 2017-02-22 DIAGNOSIS — Z1389 Encounter for screening for other disorder: Secondary | ICD-10-CM

## 2017-02-22 LAB — POCT URINALYSIS DIPSTICK
Glucose, UA: NEGATIVE
KETONES UA: NEGATIVE
LEUKOCYTES UA: NEGATIVE
Nitrite, UA: NEGATIVE
PROTEIN UA: NEGATIVE
RBC UA: NEGATIVE

## 2017-02-22 MED ORDER — LABETALOL HCL 200 MG PO TABS: 200.0000 mg | ORAL_TABLET | Freq: Two times a day (BID) | ORAL | 3 refills | Status: DC

## 2017-02-22 NOTE — Progress Notes (Signed)
High Risk Pregnancy Diagnosis(es): CHTN G3P1011 [redacted]w[redacted]d Estimated Date of Delivery: 04/20/17 BP 126/78   Pulse 76   Wt 292 lb (132.5 kg)   LMP 07/22/2016 (Exact Date)   BMI 48.59 kg/m   Urinalysis: Negative HPI:  Doing well, some carpal tunnel, wrist splints not really helping- offered referral to ortho, declines at this time BP, weight, and urine reviewed.  Reports good fm. Denies regular uc's, lof, vb, uti s/s.   Fundal Height:  38 Fetal Heart rate:  164 u/s Edema:  trace  Reviewed today's u/s: bpp 8/8, dopplers normal, afi 16cm. Discussed ptl s/s, fkc All questions were answered Assessment: [redacted]w[redacted]d CHTN Medication(s) Plans:  Labetalol  BID Treatment Plan:  Growth u/s @ 34, 38wks     2x/wk testing nst/sono @ 32wks    Deliver @ 39wks (meds) Follow up in 3d for high-risk OB appt and NST

## 2017-02-22 NOTE — Progress Notes (Signed)
Refilled labetalol at 200 bid

## 2017-02-22 NOTE — Progress Notes (Signed)
[place] 31+6 wks,cephalic,ant pl gr 2,BPP 8/8,FHR 164 BPM,AFI 16 cm,RI .65,.70 76%

## 2017-02-22 NOTE — Patient Instructions (Signed)
Call the office ([phone]) or go to [facility] if:  You begin to have strong, frequent contractions  Your water breaks.  Sometimes it is a big gush of fluid, sometimes it is just a trickle that keeps getting your panties wet or running down your legs  You have vaginal bleeding.  It is normal to have a small amount of spotting if your cervix was checked.   You don't feel your baby moving like normal.  If you don't, get you something to eat and drink and lay down and focus on feeling your baby move.  You should feel at least 10 movements in 2 hours.  If you don't, you should call the office or go to [facility].     Preterm Labor and Birth Information The normal length of a pregnancy is 39-41 weeks. Preterm labor is when labor starts before 37 completed weeks of pregnancy. What are the risk factors for preterm labor? Preterm labor is more likely to occur in women who:  Have certain infections during pregnancy such as a bladder infection, sexually transmitted infection, or infection inside the uterus (chorioamnionitis).  Have a shorter-than-normal cervix.  Have gone into preterm labor before.  Have had surgery on their cervix.  Are younger than age 17 or older than age 35.  Are African American.  Are pregnant with twins or multiple babies (multiple gestation).  Take street drugs or smoke while pregnant.  Do not gain enough weight while pregnant.  Became pregnant shortly after having been pregnant. What are the symptoms of preterm labor? Symptoms of preterm labor include:  Cramps similar to those that can happen during a menstrual period. The cramps may happen with diarrhea.  Pain in the abdomen or lower back.  Regular uterine contractions that may feel like tightening of the abdomen.  A feeling of increased pressure in the pelvis.  Increased watery or bloody mucus discharge from the vagina.  Water breaking (ruptured amniotic sac). Why is it important to  recognize signs of preterm labor? It is important to recognize signs of preterm labor because babies who are born prematurely may not be fully developed. This can put them at an increased risk for:  Long-term (chronic) heart and lung problems.  Difficulty immediately after birth with regulating body systems, including blood sugar, body temperature, heart rate, and breathing rate.  Bleeding in the brain.  Cerebral palsy.  Learning difficulties.  Death. These risks are highest for babies who are born before 34 weeks of pregnancy. How is preterm labor treated? Treatment depends on the length of your pregnancy, your condition, and the health of your baby. It may involve:  Having a stitch (suture) placed in your cervix to prevent your cervix from opening too early (cerclage).  Taking or being given medicines, such as:  Hormone medicines. These may be given early in pregnancy to help support the pregnancy.  Medicine to stop contractions.  Medicines to help mature the baby's lungs. These may be prescribed if the risk of delivery is high.  Medicines to prevent your baby from developing cerebral palsy. If the labor happens before 34 weeks of pregnancy, you may need to stay in the hospital. What should I do if I think I am in preterm labor? If you think that you are going into preterm labor, call your health care provider right away. How can I prevent preterm labor in future pregnancies? To increase your chance of having a full-term pregnancy:  Do not use any tobacco products, such   as cigarettes, chewing tobacco, and e-cigarettes. If you need help quitting, ask your health care provider.  Do not use street drugs or medicines that have not been prescribed to you during your pregnancy.  Talk with your health care provider before taking any herbal supplements, even if you have been taking them regularly.  Make sure you gain a healthy amount of weight during your pregnancy.  Watch for  infection. If you think that you might have an infection, get it checked right away.  Make sure to tell your health care provider if you have gone into preterm labor before. This information is not intended to replace advice given to you by your health care provider. Make sure you discuss any questions you have with your health care provider. Document Released: 01/29/2004 Document Revised: 04/20/2016 Document Reviewed: 03/31/2016 Elsevier Interactive Patient Education  2017 [employer]  

## 2017-02-25 ENCOUNTER — Encounter: Payer: Self-pay | Admitting: Obstetrics & Gynecology

## 2017-02-25 ENCOUNTER — Ambulatory Visit (INDEPENDENT_AMBULATORY_CARE_PROVIDER_SITE_OTHER): Payer: 59 | Admitting: Obstetrics & Gynecology

## 2017-02-25 ENCOUNTER — Other Ambulatory Visit: Payer: Self-pay | Admitting: Obstetrics & Gynecology

## 2017-02-25 ENCOUNTER — Other Ambulatory Visit (INDEPENDENT_AMBULATORY_CARE_PROVIDER_SITE_OTHER): Payer: 59

## 2017-02-25 VITALS — BP 124/72 | HR 100 | Wt 290.0 lb

## 2017-02-25 DIAGNOSIS — Z331 Pregnant state, incidental: Secondary | ICD-10-CM

## 2017-02-25 DIAGNOSIS — O10913 Unspecified pre-existing hypertension complicating pregnancy, third trimester: Secondary | ICD-10-CM | POA: Diagnosis not present

## 2017-02-25 DIAGNOSIS — O288 Other abnormal findings on antenatal screening of mother: Secondary | ICD-10-CM

## 2017-02-25 DIAGNOSIS — O0990 Supervision of high risk pregnancy, unspecified, unspecified trimester: Secondary | ICD-10-CM

## 2017-02-25 DIAGNOSIS — O10919 Unspecified pre-existing hypertension complicating pregnancy, unspecified trimester: Secondary | ICD-10-CM

## 2017-02-25 DIAGNOSIS — O09893 Supervision of other high risk pregnancies, third trimester: Secondary | ICD-10-CM

## 2017-02-25 DIAGNOSIS — Z1389 Encounter for screening for other disorder: Secondary | ICD-10-CM

## 2017-02-25 LAB — POCT URINALYSIS DIPSTICK
Glucose, UA: NEGATIVE
KETONES UA: NEGATIVE
Leukocytes, UA: NEGATIVE
Nitrite, UA: NEGATIVE
RBC UA: NEGATIVE

## 2017-02-25 NOTE — Progress Notes (Signed)
Fetal Surveillance Testing today:  NR NST BPP 8/8 with normal Doppler ratios   High Risk Pregnancy Diagnosis(es):   CHTN  G3P1011 [redacted]w[redacted]d Estimated Date of Delivery: 04/20/17  Blood pressure 124/72, pulse 100, weight 290 lb (131.5 kg), last menstrual period 07/22/2016, unknown if currently breastfeeding.  Urinalysis: Negative   HPI: The patient is being seen today for ongoing management of CHTN. Today she reports no complaints   BP weight and urine results all reviewed and noted. Patient reports good fetal movement, denies any bleeding and no rupture of membranes symptoms or regular contractions.  Fundal Height:  36 Fetal Heart rate:  130 Edema:  none  Patient is without complaints other than noted in her HPI. All questions were answered.  All lab and sonogram results have been reviewed. Comments:    Assessment:  1.  Pregnancy at [redacted]w[redacted]d,  Estimated Date of Delivery: 04/20/17 :                          2.  CHTN                        3.    Medication(s) Plans:  Labetalol 200 BID  Treatment Plan:  BPP next Thursday since had 2 in a row  Return in about 6 days (around 03/03/2017) for NST, HROB. for appointment for high risk OB care  No orders of the defined types were placed in this encounter.  Orders Placed This Encounter  Procedures  . POCT urinalysis dipstick

## 2017-02-25 NOTE — Progress Notes (Signed)
[place] 32+2 wks,breech,BPP 8/8,FHR 148 BPM,ant pl gr 2,RI .62,.62,54%,AFI 12.9 cm

## 2017-03-01 ENCOUNTER — Telehealth: Payer: Self-pay | Admitting: *Deleted

## 2017-03-01 NOTE — Telephone Encounter (Signed)
Verbal order called to [facility] Labetalol.

## 2017-03-03 ENCOUNTER — Encounter: Payer: Self-pay | Admitting: Obstetrics and Gynecology

## 2017-03-03 ENCOUNTER — Ambulatory Visit (INDEPENDENT_AMBULATORY_CARE_PROVIDER_SITE_OTHER): Payer: 59 | Admitting: Obstetrics and Gynecology

## 2017-03-03 VITALS — BP 122/60 | HR 97 | Wt 297.2 lb

## 2017-03-03 DIAGNOSIS — Z1389 Encounter for screening for other disorder: Secondary | ICD-10-CM

## 2017-03-03 DIAGNOSIS — O10919 Unspecified pre-existing hypertension complicating pregnancy, unspecified trimester: Secondary | ICD-10-CM

## 2017-03-03 DIAGNOSIS — O0990 Supervision of high risk pregnancy, unspecified, unspecified trimester: Secondary | ICD-10-CM

## 2017-03-03 DIAGNOSIS — Z331 Pregnant state, incidental: Secondary | ICD-10-CM

## 2017-03-03 LAB — POCT URINALYSIS DIPSTICK
Blood, UA: NEGATIVE
GLUCOSE UA: NEGATIVE
Ketones, UA: NEGATIVE
LEUKOCYTES UA: NEGATIVE
NITRITE UA: NEGATIVE

## 2017-03-03 NOTE — Progress Notes (Signed)
Dictation #1 [id number]  [id number]  High Risk Pregnancy HROB Diagnosis(es):   CHTN  G3P1011 [redacted]w[redacted]d Estimated Date of Delivery: 04/20/17    HPI: The patient is being seen today for ongoing management of above. Today she has no complaints  Patient reports good fetal movement, denies any bleeding and no rupture of membranes symptoms or regular contractions.   BP weight and urine results reviewed and noted. Blood pressure 122/60, pulse 97, weight 297 lb 3.2 oz (134.8 kg), last menstrual period 07/22/2016, unknown if currently breastfeeding.  Fundal Height:  35 cm Fetal Heart rate:  130  Physical Examination: Abdomen - soft, nontender, nondistended, no masses or organomegaly                                     Pelvic - examination not indicated                                     Edema:  trace  Urinalysis: trace protein  Fetal Surveillance Testing today:  NST, reactive  Lab and sonogram results have been reviewed. Comments:   Assessment:  1.  Pregnancy at [redacted]w[redacted]d,  G3P1011   :  Estimated Date of Delivery: 04/20/17                         2.  CHTN                        3.   Medication(s) Plans:  Labetalol 200 BID  Treatment Plan:  Twice weekly testing, alternating BPP and NST  Follow up in 4 days for appointment for high risk OB care, BPP  By signing my name below, I, Sonum Patel, attest that this documentation has been prepared under the direction and in the presence of Tilda Burrow, MD. Electronically Signed: Sonum Patel, Neurosurgeon. 03/03/17. 3:45 PM.  I personally performed the services described in this documentation, which was SCRIBED in my presence. The recorded information has been reviewed and considered accurate. It has been edited as necessary during review. Tilda Burrow, MD

## 2017-03-04 ENCOUNTER — Other Ambulatory Visit: Payer: Self-pay | Admitting: Obstetrics & Gynecology

## 2017-03-04 DIAGNOSIS — I10 Essential (primary) hypertension: Secondary | ICD-10-CM

## 2017-03-07 ENCOUNTER — Ambulatory Visit (INDEPENDENT_AMBULATORY_CARE_PROVIDER_SITE_OTHER): Payer: 59 | Admitting: Obstetrics & Gynecology

## 2017-03-07 ENCOUNTER — Ambulatory Visit (INDEPENDENT_AMBULATORY_CARE_PROVIDER_SITE_OTHER): Payer: 59

## 2017-03-07 ENCOUNTER — Encounter: Payer: Self-pay | Admitting: Obstetrics & Gynecology

## 2017-03-07 VITALS — BP 144/70 | HR 86 | Wt 295.4 lb

## 2017-03-07 DIAGNOSIS — O10913 Unspecified pre-existing hypertension complicating pregnancy, third trimester: Secondary | ICD-10-CM

## 2017-03-07 DIAGNOSIS — I10 Essential (primary) hypertension: Secondary | ICD-10-CM

## 2017-03-07 DIAGNOSIS — O10919 Unspecified pre-existing hypertension complicating pregnancy, unspecified trimester: Secondary | ICD-10-CM

## 2017-03-07 DIAGNOSIS — O0993 Supervision of high risk pregnancy, unspecified, third trimester: Secondary | ICD-10-CM | POA: Diagnosis not present

## 2017-03-07 DIAGNOSIS — Z1389 Encounter for screening for other disorder: Secondary | ICD-10-CM

## 2017-03-07 DIAGNOSIS — O0990 Supervision of high risk pregnancy, unspecified, unspecified trimester: Secondary | ICD-10-CM

## 2017-03-07 DIAGNOSIS — Z331 Pregnant state, incidental: Secondary | ICD-10-CM

## 2017-03-07 LAB — POCT URINALYSIS DIPSTICK
Blood, UA: NEGATIVE
Glucose, UA: NEGATIVE
KETONES UA: NEGATIVE
Nitrite, UA: NEGATIVE
PROTEIN UA: NEGATIVE

## 2017-03-07 NOTE — Progress Notes (Signed)
Fetal Surveillance Testing today:  BPP 8/8 with excellent Doppler flow studies   High Risk Pregnancy Diagnosis(es):   CHTN  G3P1011 [redacted]w[redacted]d Estimated Date of Delivery: 04/20/17  Blood pressure (!) 144/70, pulse 86, weight 295 lb 6.4 oz (134 kg), last menstrual period 07/22/2016, unknown if currently breastfeeding.  Urinalysis: Negative   HPI: The patient is being seen today for ongoing management of as above. Today she reports no complaints   BP weight and urine results all reviewed and noted. Patient reports good fetal movement, denies any bleeding and no rupture of membranes symptoms or regular contractions.  Fundal Height:  36 Fetal Heart rate:  135 Edema:  none  Patient is without complaints other than noted in her HPI. All questions were answered.  All lab and sonogram results have been reviewed. Comments:    Assessment:  1.  Pregnancy at [redacted]w[redacted]d,  Estimated Date of Delivery: 04/20/17 :                          2.  CHTN                        3.    Medication(s) Plans:  lbetalol 200 BID, baby ASA  Treatment Plan:  Cont meds, twice weekly surveillance, sonogram alternating with NST  Return in about 3 days (around 03/10/2017) for NST, HROB. for appointment for high risk OB care  No orders of the defined types were placed in this encounter.  Orders Placed This Encounter  Procedures  . POCT Urinalysis Dipstick

## 2017-03-07 NOTE — Progress Notes (Signed)
[place] 33+5 wks,cephalic,ant pl gr 3,normal ovaries bilat,afi 14.3 cm,fhr 142 bpm,BPP 8/8,efw 2654 g 71%,RI .65,.61-64%

## 2017-03-10 ENCOUNTER — Encounter: Payer: Self-pay | Admitting: Women's Health

## 2017-03-10 ENCOUNTER — Ambulatory Visit (INDEPENDENT_AMBULATORY_CARE_PROVIDER_SITE_OTHER): Payer: 59 | Admitting: Women's Health

## 2017-03-10 VITALS — BP 132/60 | HR 104 | Wt 292.5 lb

## 2017-03-10 DIAGNOSIS — O0993 Supervision of high risk pregnancy, unspecified, third trimester: Secondary | ICD-10-CM

## 2017-03-10 DIAGNOSIS — O10913 Unspecified pre-existing hypertension complicating pregnancy, third trimester: Secondary | ICD-10-CM

## 2017-03-10 DIAGNOSIS — Z1389 Encounter for screening for other disorder: Secondary | ICD-10-CM

## 2017-03-10 DIAGNOSIS — O10919 Unspecified pre-existing hypertension complicating pregnancy, unspecified trimester: Secondary | ICD-10-CM

## 2017-03-10 DIAGNOSIS — Z331 Pregnant state, incidental: Secondary | ICD-10-CM

## 2017-03-10 LAB — POCT URINALYSIS DIPSTICK
Blood, UA: NEGATIVE
GLUCOSE UA: NEGATIVE
Ketones, UA: NEGATIVE
Nitrite, UA: NEGATIVE
Protein, UA: NEGATIVE

## 2017-03-10 NOTE — Progress Notes (Signed)
High Risk Pregnancy Diagnosis(es): CHTN G3P1011 [redacted]w[redacted]d Estimated Date of Delivery: 04/20/17 BP 132/60   Pulse (!) 104   Wt 292 lb 8 oz (132.7 kg)   LMP 07/22/2016 (Exact Date)   BMI 48.67 kg/m   Urinalysis: Positive for tr leuks HPI:  Doing well BP, weight, and urine reviewed.  Reports good fm. Denies regular uc's, lof, vb, uti s/s. No complaints.  Fundal Height:  40, U+21 Fetal Heart rate:  140, reactive NST Edema:  1+  Reviewed ptl s/s, fkc, pre-e s/s All questions were answered Assessment: [redacted]w[redacted]d CHTN Medication(s) Plans:  Labetalol  BID, baby asa Treatment Plan:  2x/wk testing nst alt w/ bpp/dopp, growth u/s @ 38wks, IOL @ 39wks or earlier if clinically indicated Follow up on Monday for high-risk OB appt and bpp/dopp u/s

## 2017-03-10 NOTE — Patient Instructions (Signed)
Call the office ([phone]) or go to [facility] if:  You begin to have strong, frequent contractions  Your water breaks.  Sometimes it is a big gush of fluid, sometimes it is just a trickle that keeps getting your panties wet or running down your legs  You have vaginal bleeding.  It is normal to have a small amount of spotting if your cervix was checked.   You don't feel your baby moving like normal.  If you don't, get you something to eat and drink and lay down and focus on feeling your baby move.  You should feel at least 10 movements in 2 hours.  If you don't, you should call the office or go to [facility].   Call the office ([phone]) or go to [facility] for these signs of pre-eclampsia:  Severe headache that does not go away with Tylenol  Visual changes- seeing spots, double, blurred vision  Pain under your right breast or upper abdomen that does not go away with Tums or heartburn medicine  Nausea and/or vomiting  Severe swelling in your hands, feet, and face      Preterm Labor and Birth Information The normal length of a pregnancy is 39-41 weeks. Preterm labor is when labor starts before 37 completed weeks of pregnancy. What are the risk factors for preterm labor? Preterm labor is more likely to occur in women who:  Have certain infections during pregnancy such as a bladder infection, sexually transmitted infection, or infection inside the uterus (chorioamnionitis).  Have a shorter-than-normal cervix.  Have gone into preterm labor before.  Have had surgery on their cervix.  Are younger than age 17 or older than age 35.  Are African American.  Are pregnant with twins or multiple babies (multiple gestation).  Take street drugs or smoke while pregnant.  Do not gain enough weight while pregnant.  Became pregnant shortly after having been pregnant. What are the symptoms of preterm labor? Symptoms of preterm labor include:  Cramps similar to those  that can happen during a menstrual period. The cramps may happen with diarrhea.  Pain in the abdomen or lower back.  Regular uterine contractions that may feel like tightening of the abdomen.  A feeling of increased pressure in the pelvis.  Increased watery or bloody mucus discharge from the vagina.  Water breaking (ruptured amniotic sac). Why is it important to recognize signs of preterm labor? It is important to recognize signs of preterm labor because babies who are born prematurely may not be fully developed. This can put them at an increased risk for:  Long-term (chronic) heart and lung problems.  Difficulty immediately after birth with regulating body systems, including blood sugar, body temperature, heart rate, and breathing rate.  Bleeding in the brain.  Cerebral palsy.  Learning difficulties.  Death. These risks are highest for babies who are born before 34 weeks of pregnancy. How is preterm labor treated? Treatment depends on the length of your pregnancy, your condition, and the health of your baby. It may involve:  Having a stitch (suture) placed in your cervix to prevent your cervix from opening too early (cerclage).  Taking or being given medicines, such as:  Hormone medicines. These may be given early in pregnancy to help support the pregnancy.  Medicine to stop contractions.  Medicines to help mature the baby's lungs. These may be prescribed if the risk of delivery is high.  Medicines to prevent your baby from developing cerebral palsy. If the labor happens before   34 weeks of pregnancy, you may need to stay in the hospital. What should I do if I think I am in preterm labor? If you think that you are going into preterm labor, call your health care provider right away. How can I prevent preterm labor in future pregnancies? To increase your chance of having a full-term pregnancy:  Do not use any tobacco products, such as cigarettes, chewing tobacco, and  e-cigarettes. If you need help quitting, ask your health care provider.  Do not use street drugs or medicines that have not been prescribed to you during your pregnancy.  Talk with your health care provider before taking any herbal supplements, even if you have been taking them regularly.  Make sure you gain a healthy amount of weight during your pregnancy.  Watch for infection. If you think that you might have an infection, get it checked right away.  Make sure to tell your health care provider if you have gone into preterm labor before. This information is not intended to replace advice given to you by your health care provider. Make sure you discuss any questions you have with your health care provider. Document Released: 01/29/2004 Document Revised: 04/20/2016 Document Reviewed: 03/31/2016 Elsevier Interactive Patient Education  2017 [employer] Inc.  

## 2017-03-14 ENCOUNTER — Ambulatory Visit (INDEPENDENT_AMBULATORY_CARE_PROVIDER_SITE_OTHER): Payer: 59

## 2017-03-14 ENCOUNTER — Encounter: Payer: Self-pay | Admitting: Obstetrics and Gynecology

## 2017-03-14 ENCOUNTER — Ambulatory Visit (INDEPENDENT_AMBULATORY_CARE_PROVIDER_SITE_OTHER): Payer: 59 | Admitting: Obstetrics and Gynecology

## 2017-03-14 VITALS — BP 140/70 | HR 78 | Wt 289.4 lb

## 2017-03-14 DIAGNOSIS — Z331 Pregnant state, incidental: Secondary | ICD-10-CM

## 2017-03-14 DIAGNOSIS — O0990 Supervision of high risk pregnancy, unspecified, unspecified trimester: Secondary | ICD-10-CM

## 2017-03-14 DIAGNOSIS — O0993 Supervision of high risk pregnancy, unspecified, third trimester: Secondary | ICD-10-CM

## 2017-03-14 DIAGNOSIS — O10913 Unspecified pre-existing hypertension complicating pregnancy, third trimester: Secondary | ICD-10-CM | POA: Diagnosis not present

## 2017-03-14 DIAGNOSIS — Z1389 Encounter for screening for other disorder: Secondary | ICD-10-CM

## 2017-03-14 LAB — POCT URINALYSIS DIPSTICK
Glucose, UA: NEGATIVE
Leukocytes, UA: NEGATIVE
Nitrite, UA: NEGATIVE
RBC UA: NEGATIVE

## 2017-03-14 NOTE — Progress Notes (Signed)
Patient ID: Patricia [name], female   DOB: 14-Jan-1991, 26 y.o.   MRN: [id number]   High Risk Pregnancy HROB Diagnosis(es):   CHTN  G3P1011 [redacted]w[redacted]d Estimated Date of Delivery: 04/20/17     HPI: The patient is being seen today for ongoing management of the above.  Chief Complaint  Patient presents with  . Routine Prenatal Visit    bpp  ____  Today she reports she is doing well. She tries to remain active during breaks at work.   Patient reports good fetal movement. She denies any bleeding and no rupture of membranes symptoms or regular contractions.  BP weight and urine results reviewed and noted. Blood pressure 140/70, pulse 78, weight 289 lb 6.4 oz (131.3 kg), last menstrual period 07/22/2016, unknown if currently breastfeeding.  Fundal Height:  37 cm, U+18 cm Fetal Heart rate:  150 bpm Physical Examination: Abdomen - soft, nontender, nondistended, no masses or organomegaly                              Edema:  none  Urinalysis: POSITIVE for small ketones, trace protein   Fetal Surveillance Testing today:  BPP 8/8, dopp u/s  Lab and sonogram results have been reviewed.  Assessment:  1.  Pregnancy at [redacted]w[redacted]d,  G3P1011   :  Estimated Date of Delivery: 04/20/17                         2.  CHTN  Medication(s) Plans:  Labetalol  BID, baby asa   Treatment Plan:   1. 2x/wk testing nst alt w/ bpp/dopp 2. growth u/s @ 38wks 3. IOL @ 39wks or earlier if clinically indicated   Follow up in 3 days for appointment for high risk OB care, NST   By signing my name below, I, Doreatha Martin, attest that this documentation has been prepared under the direction and in the presence of Tilda Burrow, MD. Electronically Signed: Doreatha Martin, ED Scribe. 03/14/17. 9:26 AM.  I personally performed the services described in this documentation, which was SCRIBED in my presence. The recorded information has been reviewed and considered accurate. It has been edited as necessary during  review. Tilda Burrow, MD

## 2017-03-14 NOTE — Progress Notes (Signed)
[place] 34+5 wks,cephalic,ant pl gr 3,BPP 8/8,normal ovaries bilat,fhr 150 bpm,afi 16 cm,RI .61,.54,S/D 2.1,2.5  51%

## 2017-03-17 ENCOUNTER — Encounter: Payer: Self-pay | Admitting: Obstetrics & Gynecology

## 2017-03-17 ENCOUNTER — Ambulatory Visit (INDEPENDENT_AMBULATORY_CARE_PROVIDER_SITE_OTHER): Payer: 59 | Admitting: Obstetrics & Gynecology

## 2017-03-17 ENCOUNTER — Other Ambulatory Visit: Payer: 59 | Admitting: Obstetrics & Gynecology

## 2017-03-17 VITALS — BP 130/70 | HR 103 | Wt 295.0 lb

## 2017-03-17 DIAGNOSIS — O10919 Unspecified pre-existing hypertension complicating pregnancy, unspecified trimester: Secondary | ICD-10-CM

## 2017-03-17 DIAGNOSIS — O0993 Supervision of high risk pregnancy, unspecified, third trimester: Secondary | ICD-10-CM

## 2017-03-17 DIAGNOSIS — O0990 Supervision of high risk pregnancy, unspecified, unspecified trimester: Secondary | ICD-10-CM

## 2017-03-17 DIAGNOSIS — O10913 Unspecified pre-existing hypertension complicating pregnancy, third trimester: Secondary | ICD-10-CM

## 2017-03-17 DIAGNOSIS — Z331 Pregnant state, incidental: Secondary | ICD-10-CM

## 2017-03-17 DIAGNOSIS — Z1389 Encounter for screening for other disorder: Secondary | ICD-10-CM

## 2017-03-17 LAB — POCT URINALYSIS DIPSTICK
Blood, UA: NEGATIVE
Glucose, UA: NEGATIVE
Ketones, UA: NEGATIVE
Leukocytes, UA: NEGATIVE
Nitrite, UA: NEGATIVE
Protein, UA: NEGATIVE

## 2017-03-17 NOTE — Progress Notes (Signed)
Patient ID: Patricia [name], female   DOB: 1991-09-29, 26 y.o.   MRN: [id number] Fetal Surveillance Testing today:  Reactive NST   High Risk Pregnancy Diagnosis(es):   CHTN on meds  G3P1011 [redacted]w[redacted]d Estimated Date of Delivery: 04/20/17  Blood pressure 130/70, pulse (!) 103, weight 295 lb (133.8 kg), last menstrual period 07/22/2016, unknown if currently breastfeeding.  Urinalysis: Negative   HPI: The patient is being seen today for ongoing management of CHTN. Today she reports no headache or CNS symptoms   BP weight and urine results all reviewed and noted. Patient reports good fetal movement, denies any bleeding and no rupture of membranes symptoms or regular contractions.  Fundal Height:  38 Fetal Heart rate:  135 Edema:  1+  Patient is without complaints other than noted in her HPI. All questions were answered.  All lab and sonogram results have been reviewed. Comments:    Assessment:  1.  Pregnancy at [redacted]w[redacted]d,  Estimated Date of Delivery: 04/20/17 :                          2.  CHTN                        3.    Medication(s) Plans:  Labetalol 200 BID  Treatment Plan:  Twice weekly surveillance, sonogram alternating with NST, induction at 39 weeks or as clinically indicated   Return in about 4 days (around 03/21/2017) for BPP/sono, HROB. for appointment for high risk OB care  No orders of the defined types were placed in this encounter.  Orders Placed This Encounter  Procedures  . US FETAL BPP W/NONSTRESS  . [place] UA Cord Doppler  . POCT urinalysis dipstick

## 2017-03-21 ENCOUNTER — Ambulatory Visit (INDEPENDENT_AMBULATORY_CARE_PROVIDER_SITE_OTHER): Payer: 59 | Admitting: Obstetrics & Gynecology

## 2017-03-21 ENCOUNTER — Ambulatory Visit (INDEPENDENT_AMBULATORY_CARE_PROVIDER_SITE_OTHER): Payer: 59

## 2017-03-21 ENCOUNTER — Encounter: Payer: Self-pay | Admitting: Obstetrics & Gynecology

## 2017-03-21 VITALS — BP 136/78 | HR 88 | Wt 292.0 lb

## 2017-03-21 DIAGNOSIS — O0990 Supervision of high risk pregnancy, unspecified, unspecified trimester: Secondary | ICD-10-CM

## 2017-03-21 DIAGNOSIS — O10913 Unspecified pre-existing hypertension complicating pregnancy, third trimester: Secondary | ICD-10-CM

## 2017-03-21 DIAGNOSIS — Z331 Pregnant state, incidental: Secondary | ICD-10-CM

## 2017-03-21 DIAGNOSIS — O10919 Unspecified pre-existing hypertension complicating pregnancy, unspecified trimester: Secondary | ICD-10-CM

## 2017-03-21 DIAGNOSIS — Z1389 Encounter for screening for other disorder: Secondary | ICD-10-CM

## 2017-03-21 LAB — POCT URINALYSIS DIPSTICK
GLUCOSE UA: NEGATIVE
Ketones, UA: NEGATIVE
Nitrite, UA: NEGATIVE
RBC UA: NEGATIVE

## 2017-03-21 NOTE — Progress Notes (Signed)
Fetal Surveillance Testing today:  BPP 8/8   High Risk Pregnancy Diagnosis(es):   CHTN  G3P1011 [redacted]w[redacted]d Estimated Date of Delivery: 04/20/17  Blood pressure 136/78, pulse 88, weight 292 lb (132.5 kg), last menstrual period 07/22/2016, unknown if currently breastfeeding.  Urinalysis: Positive for trace protein   HPI: The patient is being seen today for ongoing management of as above. Today she reports no complaints   BP weight and urine results all reviewed and noted. Patient reports good fetal movement, denies any bleeding and no rupture of membranes symptoms or regular contractions.  Fundal Height:  38 Fetal Heart rate:  135 Edema:  none  Patient is without complaints other than noted in her HPI. All questions were answered.  All lab and sonogram results have been reviewed. Comments:    Assessment:  1.  Pregnancy at [redacted]w[redacted]d,  Estimated Date of Delivery: 04/20/17 :                          2.  CHTN                        3.    Medication(s) Plans:  Labetalol 200 BID, baby ASA  Treatment Plan:  Twice weekly surveillance, induction 39 weeks  Return in about 3 days (around 03/24/2017) for NST, HROB. for appointment for high risk OB care  No orders of the defined types were placed in this encounter.  Orders Placed This Encounter  Procedures  . POCT urinalysis dipstick

## 2017-03-21 NOTE — Progress Notes (Signed)
[place] 35+6 wks,cephalic,BPP 8/8,ant pl gr 3,normal ov's bilat,fhr 137 bpm,afi 13.8 cm,RI .54,.64 71%

## 2017-03-24 ENCOUNTER — Encounter: Payer: Self-pay | Admitting: Women's Health

## 2017-03-24 ENCOUNTER — Other Ambulatory Visit: Payer: 59 | Admitting: Women's Health

## 2017-03-24 ENCOUNTER — Ambulatory Visit (INDEPENDENT_AMBULATORY_CARE_PROVIDER_SITE_OTHER): Payer: 59 | Admitting: Women's Health

## 2017-03-24 VITALS — BP 140/68 | HR 101 | Wt 294.5 lb

## 2017-03-24 DIAGNOSIS — Z331 Pregnant state, incidental: Secondary | ICD-10-CM

## 2017-03-24 DIAGNOSIS — O0993 Supervision of high risk pregnancy, unspecified, third trimester: Secondary | ICD-10-CM

## 2017-03-24 DIAGNOSIS — O10913 Unspecified pre-existing hypertension complicating pregnancy, third trimester: Secondary | ICD-10-CM

## 2017-03-24 DIAGNOSIS — Z1389 Encounter for screening for other disorder: Secondary | ICD-10-CM

## 2017-03-24 DIAGNOSIS — O10919 Unspecified pre-existing hypertension complicating pregnancy, unspecified trimester: Secondary | ICD-10-CM

## 2017-03-24 LAB — POCT URINALYSIS DIPSTICK
Blood, UA: NEGATIVE
Glucose, UA: NEGATIVE
KETONES UA: NEGATIVE
Nitrite, UA: NEGATIVE
PROTEIN UA: NEGATIVE

## 2017-03-24 NOTE — Progress Notes (Signed)
High Risk Pregnancy Diagnosis(es): CHTN G3P1011 4423w1d Estimated Date of Delivery: 04/20/17 BP 140/68   Pulse (!) 101   Wt 294 lb 8 oz (133.6 kg)   LMP 07/22/2016 (Exact Date)   BMI 49.01 kg/m   Urinalysis: Positive for small leuks HPI:  Doing well, no complaints BP, weight, and urine reviewed.  Reports good fm. Denies regular uc's, lof, vb, uti s/s.   Fundal Height:  42 Fetal Heart rate:  135, reactive NST Edema:  +2  Reviewed labor s/s, fkc All questions were answered Assessment: 6323w1d CHTN Medication(s) Plans:  Labetalol 200mg  BID, baby asa Treatment Plan:  Growth u/s @ 38wks     2x/wk testing nst/sono   Deliver 39wks (meds) Follow up on Monday for high-risk OB appt, GBS, and bpp/dopp u/s

## 2017-03-24 NOTE — Patient Instructions (Signed)
Call the office ([phone]) or go to [facility] if:  You begin to have strong, frequent contractions  Your water breaks.  Sometimes it is a big gush of fluid, sometimes it is just a trickle that keeps getting your panties wet or running down your legs  You have vaginal bleeding.  It is normal to have a small amount of spotting if your cervix was checked.   You don't feel your baby moving like normal.  If you don't, get you something to eat and drink and lay down and focus on feeling your baby move.  You should feel at least 10 movements in 2 hours.  If you don't, you should call the office or go to [facility].     Braxton Hicks Contractions Contractions of the uterus can occur throughout pregnancy, but they are not always a sign that you are in labor. You may have practice contractions called Braxton Hicks contractions. These false labor contractions are sometimes confused with true labor. What are Braxton Hicks contractions? Braxton Hicks contractions are tightening movements that occur in the muscles of the uterus before labor. Unlike true labor contractions, these contractions do not result in opening (dilation) and thinning of the cervix. Toward the end of pregnancy (32-34 weeks), Braxton Hicks contractions can happen more often and may become stronger. These contractions are sometimes difficult to tell apart from true labor because they can be very uncomfortable. You should not feel embarrassed if you go to the hospital with false labor. Sometimes, the only way to tell if you are in true labor is for your health care provider to look for changes in the cervix. The health care provider will do a physical exam and may monitor your contractions. If you are not in true labor, the exam should show that your cervix is not dilating and your water has not broken. If there are no prenatal problems or other health problems associated with your pregnancy, it is completely safe for you to be sent  home with false labor. You may continue to have Braxton Hicks contractions until you go into true labor. How can I tell the difference between true labor and false labor?  Differences ? False labor ? Contractions last 30-70 seconds.: Contractions are usually shorter and not as strong as true labor contractions. ? Contractions become very regular.: Contractions are usually irregular. ? Discomfort is usually felt in the top of the uterus, and it spreads to the lower abdomen and low back.: Contractions are often felt in the front of the lower abdomen and in the groin. ? Contractions do not go away with walking.: Contractions may go away when you walk around or change positions while lying down. ? Contractions usually become more intense and increase in frequency.: Contractions get weaker and are shorter-lasting as time goes on. ? The cervix dilates and gets thinner.: The cervix usually does not dilate or become thin. Follow these instructions at home:  Take over-the-counter and prescription medicines only as told by your health care provider.  Keep up with your usual exercises and follow other instructions from your health care provider.  Eat and drink lightly if you think you are going into labor.  If Braxton Hicks contractions are making you uncomfortable: ? Change your position from lying down or resting to walking, or change from walking to resting. ? Sit and rest in a tub of warm water. ? Drink enough fluid to keep your urine clear or pale yellow. Dehydration may cause these contractions. ?   Do slow and deep breathing several times an hour.  Keep all follow-up prenatal visits as told by your health care provider. This is important. Contact a health care provider if:  You have a fever.  You have continuous pain in your abdomen. Get help right away if:  Your contractions become stronger, more regular, and closer together.  You have fluid leaking or gushing from your vagina.  You  pass blood-tinged mucus (bloody show).  You have bleeding from your vagina.  You have low back pain that you never had before.  You feel your baby's head pushing down and causing pelvic pressure.  Your baby is not moving inside you as much as it used to. Summary  Contractions that occur before labor are called Braxton Hicks contractions, false labor, or practice contractions.  Braxton Hicks contractions are usually shorter, weaker, farther apart, and less regular than true labor contractions. True labor contractions usually become progressively stronger and regular and they become more frequent.  Manage discomfort from Braxton Hicks contractions by changing position, resting in a warm bath, drinking plenty of water, or practicing deep breathing. This information is not intended to replace advice given to you by your health care provider. Make sure you discuss any questions you have with your health care provider. Document Released: 11/08/2005 Document Revised: 09/27/2016 Document Reviewed: 09/27/2016 Elsevier Interactive Patient Education  2017 [employer] Inc[employer]  

## 2017-03-28 ENCOUNTER — Ambulatory Visit (INDEPENDENT_AMBULATORY_CARE_PROVIDER_SITE_OTHER): Payer: 59 | Admitting: Obstetrics & Gynecology

## 2017-03-28 ENCOUNTER — Ambulatory Visit (INDEPENDENT_AMBULATORY_CARE_PROVIDER_SITE_OTHER): Payer: 59

## 2017-03-28 ENCOUNTER — Encounter: Payer: Self-pay | Admitting: Obstetrics & Gynecology

## 2017-03-28 VITALS — BP 120/60 | HR 78 | Wt 296.0 lb

## 2017-03-28 DIAGNOSIS — Z3A36 36 weeks gestation of pregnancy: Secondary | ICD-10-CM

## 2017-03-28 DIAGNOSIS — Z3483 Encounter for supervision of other normal pregnancy, third trimester: Secondary | ICD-10-CM

## 2017-03-28 DIAGNOSIS — Z1389 Encounter for screening for other disorder: Secondary | ICD-10-CM

## 2017-03-28 DIAGNOSIS — O10913 Unspecified pre-existing hypertension complicating pregnancy, third trimester: Secondary | ICD-10-CM | POA: Diagnosis not present

## 2017-03-28 DIAGNOSIS — Z331 Pregnant state, incidental: Secondary | ICD-10-CM

## 2017-03-28 LAB — POCT URINALYSIS DIPSTICK
Blood, UA: NEGATIVE
GLUCOSE UA: NEGATIVE
KETONES UA: NEGATIVE
LEUKOCYTES UA: NEGATIVE
Nitrite, UA: NEGATIVE

## 2017-03-28 NOTE — Progress Notes (Signed)
US 36+5 wks,cephalic,fhr 146 bpm,BPP 8/8,ant pl gr 3,afi 8 cm, RI .64,.62 78%

## 2017-03-28 NOTE — Progress Notes (Signed)
Fetal Surveillance Testing today:  BPP 8/8 with normal Dopplers   High Risk Pregnancy Diagnosis(es):   CHTN  G3P1011 9160w5d Estimated Date of Delivery: 04/20/17  Blood pressure 120/60, pulse 78, weight 296 lb (134.3 kg), last menstrual period 07/22/2016, unknown if currently breastfeeding.  Urinalysis: Negative   HPI: The patient is being seen today for ongoing management of as above. Today she reports no complaints   BP weight and urine results all reviewed and noted. Patient reports good fetal movement, denies any bleeding and no rupture of membranes symptoms or regular contractions.  Fundal Height:  39 Fetal Heart rate:  144 Edema:  none  Patient is without complaints other than noted in her HPI. All questions were answered.  All lab and sonogram results have been reviewed. Comments:    Assessment:  1.  Pregnancy at 3360w5d,  Estimated Date of Delivery: 04/20/17 :                          2.  CHTN                        3.    Medication(s) Plans:  Labetalol 200 BID, baby asa  Treatment Plan:  Twice weekly surveillance, NST alt sono induction 39 weeks  Return in about 3 days (around 03/31/2017) for NST, HROB. for appointment for high risk OB care  No orders of the defined types were placed in this encounter.  Orders Placed This Encounter  Procedures  . GC/Chlamydia Probe Amp  . Strep Gp B NAA  . POCT urinalysis dipstick

## 2017-03-29 LAB — OB RESULTS CONSOLE GBS: GBS: NEGATIVE

## 2017-03-30 LAB — GC/CHLAMYDIA PROBE AMP
CHLAMYDIA, DNA PROBE: NEGATIVE
NEISSERIA GONORRHOEAE BY PCR: NEGATIVE

## 2017-03-30 LAB — STREP GP B NAA: STREP GROUP B AG: NEGATIVE

## 2017-03-31 ENCOUNTER — Telehealth (HOSPITAL_COMMUNITY): Payer: Self-pay | Admitting: MS"

## 2017-03-31 ENCOUNTER — Ambulatory Visit (INDEPENDENT_AMBULATORY_CARE_PROVIDER_SITE_OTHER): Payer: 59 | Admitting: Women's Health

## 2017-03-31 ENCOUNTER — Encounter: Payer: Self-pay | Admitting: Women's Health

## 2017-03-31 VITALS — BP 140/78 | HR 102 | Wt 297.0 lb

## 2017-03-31 DIAGNOSIS — Z331 Pregnant state, incidental: Secondary | ICD-10-CM

## 2017-03-31 DIAGNOSIS — O10913 Unspecified pre-existing hypertension complicating pregnancy, third trimester: Secondary | ICD-10-CM

## 2017-03-31 DIAGNOSIS — O1213 Gestational proteinuria, third trimester: Secondary | ICD-10-CM

## 2017-03-31 DIAGNOSIS — O0993 Supervision of high risk pregnancy, unspecified, third trimester: Secondary | ICD-10-CM

## 2017-03-31 DIAGNOSIS — O10919 Unspecified pre-existing hypertension complicating pregnancy, unspecified trimester: Secondary | ICD-10-CM

## 2017-03-31 DIAGNOSIS — Z1389 Encounter for screening for other disorder: Secondary | ICD-10-CM

## 2017-03-31 LAB — POCT URINALYSIS DIPSTICK
Glucose, UA: NEGATIVE
KETONES UA: NEGATIVE
NITRITE UA: NEGATIVE
RBC UA: NEGATIVE

## 2017-03-31 NOTE — Progress Notes (Signed)
High Risk Pregnancy Diagnosis(es): CHTN G3P1011 3372w1d Estimated Date of Delivery: 04/20/17 BP 140/78   Pulse (!) 102   Wt 297 lb (134.7 kg)   LMP 07/22/2016 (Exact Date)   BMI 49.42 kg/m   Urinalysis: Positive for 1+ protein, tr leuks HPI:  Doing well. Denies ha, visual changes, ruq/epigastric pain, n/v.   BP, weight, and urine reviewed.  Reports good fm. Denies regular uc's, lof, vb, uti s/s. No complaints.  Fundal Height:  42, U+24 Fetal Heart rate:  135, reactive NST Edema:  2+, DTRs 2+, no clonus 1+ proteinuria, has intermittently had trace; BP stable, no sx  Reviewed labor s/s, fkc, pre-e s/s, gbs neg All questions were answered Assessment: 6772w1d CHTN Medication(s) Plans:  Labetalol 200mg  BID, baby asa Treatment Plan:   Growth u/s @  38wks     2x/wk testing nst/sono @ 32wks    Deliver @ 39wks  Follow up on Monday for high-risk OB appt and bpp/dopp/efw/afi u/s Will send urine P:C ratio today

## 2017-03-31 NOTE — Patient Instructions (Addendum)
Call the office ([phone]) or go to [facility] if:  You begin to have strong, frequent contractions  Your water breaks.  Sometimes it is a big gush of fluid, sometimes it is just a trickle that keeps getting your panties wet or running down your legs  You have vaginal bleeding.  It is normal to have a small amount of spotting if your cervix was checked.   You don't feel your baby moving like normal.  If you don't, get you something to eat and drink and lay down and focus on feeling your baby move.  You should feel at least 10 movements in 2 hours.  If you don't, you should call the office or go to [facility].    Call the office ([phone]) or [facility] for these signs of pre-eclampsia:  Severe headache that does not go away with Tylenol  Visual changes- seeing spots, double, blurred vision  Pain under your right breast or upper abdomen that does not go away with Tums or heartburn medicine  Nausea and/or vomiting  Severe swelling in your hands, feet, and face      Braxton Hicks Contractions Contractions of the uterus can occur throughout pregnancy, but they are not always a sign that you are in labor. You may have practice contractions called Braxton Hicks contractions. These false labor contractions are sometimes confused with true labor. What are Braxton Hicks contractions? Braxton Hicks contractions are tightening movements that occur in the muscles of the uterus before labor. Unlike true labor contractions, these contractions do not result in opening (dilation) and thinning of the cervix. Toward the end of pregnancy (32-34 weeks), Braxton Hicks contractions can happen more often and may become stronger. These contractions are sometimes difficult to tell apart from true labor because they can be very uncomfortable. You should not feel embarrassed if you go to the hospital with false labor. Sometimes, the only way to tell if you are in true labor is for your  health care provider to look for changes in the cervix. The health care provider will do a physical exam and may monitor your contractions. If you are not in true labor, the exam should show that your cervix is not dilating and your water has not broken. If there are no prenatal problems or other health problems associated with your pregnancy, it is completely safe for you to be sent home with false labor. You may continue to have Braxton Hicks contractions until you go into true labor. How can I tell the difference between true labor and false labor?  Differences  False labor  Contractions last 30-70 seconds.: Contractions are usually shorter and not as strong as true labor contractions.  Contractions become very regular.: Contractions are usually irregular.  Discomfort is usually felt in the top of the uterus, and it spreads to the lower abdomen and low back.: Contractions are often felt in the front of the lower abdomen and in the groin.  Contractions do not go away with walking.: Contractions may go away when you walk around or change positions while lying down.  Contractions usually become more intense and increase in frequency.: Contractions get weaker and are shorter-lasting as time goes on.  The cervix dilates and gets thinner.: The cervix usually does not dilate or become thin. Follow these instructions at home:  Take over-the-counter and prescription medicines only as told by your health care provider.  Keep up with your usual exercises and follow other instructions from your health   care provider.  Eat and drink lightly if you think you are going into labor.  If Braxton Hicks contractions are making you uncomfortable:  Change your position from lying down or resting to walking, or change from walking to resting.  Sit and rest in a tub of warm water.  Drink enough fluid to keep your urine clear or pale yellow. Dehydration may cause these contractions.  Do slow and deep  breathing several times an hour.  Keep all follow-up prenatal visits as told by your health care provider. This is important. Contact a health care provider if:  You have a fever.  You have continuous pain in your abdomen. Get help right away if:  Your contractions become stronger, more regular, and closer together.  You have fluid leaking or gushing from your vagina.  You pass blood-tinged mucus (bloody show).  You have bleeding from your vagina.  You have low back pain that you never had before.  You feel your baby's head pushing down and causing pelvic pressure.  Your baby is not moving inside you as much as it used to. Summary  Contractions that occur before labor are called Braxton Hicks contractions, false labor, or practice contractions.  Braxton Hicks contractions are usually shorter, weaker, farther apart, and less regular than true labor contractions. True labor contractions usually become progressively stronger and regular and they become more frequent.  Manage discomfort from Braxton Hicks contractions by changing position, resting in a warm bath, drinking plenty of water, or practicing deep breathing. This information is not intended to replace advice given to you by your health care provider. Make sure you discuss any questions you have with your health care provider. Document Released: 11/08/2005 Document Revised: 09/27/2016 Document Reviewed: 09/27/2016 Elsevier Interactive Patient Education  2017 Else[employer] Inc.  

## 2017-03-31 NOTE — Telephone Encounter (Signed)
Ms. [name] called to request that information be placed in her chart to denote her desire for postnatal chromosome testing for her baby. She stated that she is scheduled to be induced on 04/13/17. Ms. Seward MethBroadnax previously had noninvasive prenatal screening, which indicated an increased risk for XXY (Klinefelter), associated with a 29% positive predictive value, but subsequent NIPS via different laboratory methods was within normal limits. The patient would like to pursue postnatal chromosome confirmation given these prenatal screening results. We discussed that I placed a note in the pink "sticky note" in her OB chart that indicates this desire. Discussed that the baby's pediatrician would be the ordering physician for chromosome analysis. Dr. Antonietta BarcelonaMark Bucy in [place] is the pediatrician, and thus, [facility]. We reviewed that if chromosome analysis is not performed during the hospital stay, it can also be performed for the baby as an outpatient through her regular pediatrician. Ms. Seward MethBroadnax was encouraged to call with additional questions.   Clydie BraunKaren Maurisha Mongeau 03/31/2017 9:57 AM

## 2017-03-31 NOTE — Addendum Note (Signed)
Addended by: Cheral MarkerBOOKER, Madelina Sanda R on: 03/31/2017 04:13 PM   Modules accepted: Orders

## 2017-04-01 ENCOUNTER — Encounter: Payer: Self-pay | Admitting: Women's Health

## 2017-04-01 LAB — PROTEIN / CREATININE RATIO, URINE
Creatinine, Urine: 143.7 mg/dL
PROTEIN/CREAT RATIO: 211 mg/g{creat} — AB (ref 0–200)
Protein, Ur: 30.3 mg/dL

## 2017-04-02 LAB — URINE CULTURE

## 2017-04-04 ENCOUNTER — Encounter: Payer: Self-pay | Admitting: Obstetrics & Gynecology

## 2017-04-04 ENCOUNTER — Ambulatory Visit (INDEPENDENT_AMBULATORY_CARE_PROVIDER_SITE_OTHER): Payer: 59 | Admitting: Obstetrics & Gynecology

## 2017-04-04 ENCOUNTER — Ambulatory Visit (INDEPENDENT_AMBULATORY_CARE_PROVIDER_SITE_OTHER): Payer: 59

## 2017-04-04 VITALS — BP 138/84 | HR 97 | Wt 295.0 lb

## 2017-04-04 DIAGNOSIS — Z331 Pregnant state, incidental: Secondary | ICD-10-CM

## 2017-04-04 DIAGNOSIS — O0993 Supervision of high risk pregnancy, unspecified, third trimester: Secondary | ICD-10-CM

## 2017-04-04 DIAGNOSIS — O10913 Unspecified pre-existing hypertension complicating pregnancy, third trimester: Secondary | ICD-10-CM | POA: Diagnosis not present

## 2017-04-04 DIAGNOSIS — Z1389 Encounter for screening for other disorder: Secondary | ICD-10-CM

## 2017-04-04 DIAGNOSIS — O10919 Unspecified pre-existing hypertension complicating pregnancy, unspecified trimester: Secondary | ICD-10-CM

## 2017-04-04 LAB — POCT URINALYSIS DIPSTICK
Blood, UA: NEGATIVE
Glucose, UA: NEGATIVE
Ketones, UA: NEGATIVE
NITRITE UA: NEGATIVE
PROTEIN UA: NEGATIVE

## 2017-04-04 NOTE — Progress Notes (Signed)
Fetal Surveillance Testing today:  BPP 8/8 with excellent Doppler flow   High Risk Pregnancy Diagnosis(es):   CHTN  G3P1011 10877w5d Estimated Date of Delivery: 04/20/17  Blood pressure 138/84, pulse 97, weight 295 lb (133.8 kg), last menstrual period 07/22/2016, unknown if currently breastfeeding.  Urinalysis: Negative   HPI: The patient is being seen today for ongoing management of CHTN. Today she reports no complaints   BP weight and urine results all reviewed and noted. Patient reports good fetal movement, denies any bleeding and no rupture of membranes symptoms or regular contractions.  Fundal Height:  42 Fetal Heart rate:  145 Edema:  1+  Patient is without complaints other than noted in her HPI. All questions were answered.  All lab and sonogram results have been reviewed. Comments:    Assessment:  1.  Pregnancy at 4077w5d,  Estimated Date of Delivery: 04/20/17 :                          2.  CHTN on labetalol 200 BID                        3.    Medication(s) Plans:  Continue labetalol 200 BID  Treatment Plan:  Scheduled induction 04/13/2017@0730 , NST 04/07/2017, HROB  Return in about 3 days (around 04/07/2017) for NST, HROB. for appointment for high risk OB care  No orders of the defined types were placed in this encounter.  Orders Placed This Encounter  Procedures  . POCT urinalysis dipstick

## 2017-04-04 NOTE — Progress Notes (Signed)
US 37+5 wks,cephalic,fhr 137 bpm,ant pl gr 3,afi 16.8 cm,BPP 8/8,RI .57,.53 47%,efw 3368 g 64%

## 2017-04-04 NOTE — Treatment Plan (Signed)
   Induction Assessment Scheduling Form: [facility] to [facility]:  [phone]  [name]                                                                                   DOB:  Dec 29, 1990                                                            MRN:  [id number]                                                                     Phone #:   [phone]                         Provider:  [facility]  GP:  [id number]                                                            Estimated Date of Delivery: 04/20/17  Dating Criteria: 1st trimester sonogram    Medical Indications for induction:  Chronic Hypertension Admission Date/Time:  04/13/2017@0730  Gestational age on admission:  4069w0d   Filed Weights   04/04/17 1500  Weight: 295 lb (133.8 kg)   HIV:  Non Reactive (03/06 0859) GBS: Negative (05/07 1645)  Cervical exam pending   Method of induction(proposed):  cytotec   Scheduling Provider Signature:  Patricia [name],Patricia Madonna H, MD                                            Today's Date:  04/04/2017  3:28 PM

## 2017-04-07 ENCOUNTER — Ambulatory Visit (INDEPENDENT_AMBULATORY_CARE_PROVIDER_SITE_OTHER): Payer: 59 | Admitting: Obstetrics and Gynecology

## 2017-04-07 ENCOUNTER — Encounter: Payer: Self-pay | Admitting: Obstetrics and Gynecology

## 2017-04-07 VITALS — BP 146/78 | HR 98 | Wt 296.5 lb

## 2017-04-07 DIAGNOSIS — O0990 Supervision of high risk pregnancy, unspecified, unspecified trimester: Secondary | ICD-10-CM

## 2017-04-07 DIAGNOSIS — Z1389 Encounter for screening for other disorder: Secondary | ICD-10-CM | POA: Diagnosis not present

## 2017-04-07 DIAGNOSIS — O0993 Supervision of high risk pregnancy, unspecified, third trimester: Secondary | ICD-10-CM

## 2017-04-07 DIAGNOSIS — Z331 Pregnant state, incidental: Secondary | ICD-10-CM

## 2017-04-07 LAB — POCT URINALYSIS DIPSTICK
Blood, UA: NEGATIVE
GLUCOSE UA: NEGATIVE
Ketones, UA: NEGATIVE
Leukocytes, UA: NEGATIVE
NITRITE UA: NEGATIVE
Protein, UA: NEGATIVE

## 2017-04-07 NOTE — Progress Notes (Signed)
Patient ID: Patricia FallingJudy C [name], female   DOB: March 15, 1991, 26 y.o.   MRN: [id number]   High Risk Pregnancy HROB Diagnosis(es):   CHTN  G3P1011 3257w1d Estimated Date of Delivery: 04/20/17     HPI: The patient is being seen today for ongoing management of the above.  Today she reports she is doing well   Patient reports good fetal movement. She denies any bleeding and no rupture of membranes symptoms or regular contractions.  BP weight and urine results reviewed and noted. Blood pressure (!) 146/78, pulse 98, weight 296 lb 8 oz (134.5 kg), last menstrual period 07/22/2016, unknown if currently breastfeeding.  Fundal Height:  41 cm Fetal Heart rate:  140 bpm Physical Examination: Abdomen - soft, nontender, nondistended, no masses or organomegaly                                     Pelvic - VULVA: normal appearing vulva with no masses, tenderness or lesions, VAGINA: normal appearing vagina with normal color and discharge, no lesions, CERVIX: normal appearing cervix without discharge or lesions, posterior, closed, soft -3  vertex                                     Edema:  none  Urinalysis:NEGATIVE for all  Fetal Surveillance Testing today:  NST reactive   Lab and sonogram results have been reviewed.  Assessment:  1.  Pregnancy at 1957w1d,  G3P1011   :  Estimated Date of Delivery: 04/20/17                         2.  CHTN               Medication(s) Plans:  Labetalol 200 mg bid   Treatment Plan:   1. Scheduled induction 04/13/2017@0730   2. 2x/wk testing nst alt w/ bpp/dopp   Follow up in 4 days for appointment for high risk OB care, BPP IOL scheduled for 39.0 wk  By signing my name below, I, Doreatha MartinEva Mathews, attest that this documentation has been prepared under the direction and in the presence of Tilda BurrowFerguson, Kaeden Mester V, MD. Electronically Signed: Doreatha MartinEva Mathews, ED Scribe. 04/07/17. 4:02 PM.  I personally performed the services described in this documentation, which was SCRIBED in my presence. The  recorded information has been reviewed and considered accurate. It has been edited as necessary during review. Tilda BurrowFERGUSON,Amaree Leeper V, MD

## 2017-04-08 ENCOUNTER — Other Ambulatory Visit: Payer: Self-pay | Admitting: Obstetrics and Gynecology

## 2017-04-08 ENCOUNTER — Other Ambulatory Visit: Payer: Self-pay | Admitting: Advanced Practice Midwife

## 2017-04-08 DIAGNOSIS — O10913 Unspecified pre-existing hypertension complicating pregnancy, third trimester: Secondary | ICD-10-CM

## 2017-04-10 ENCOUNTER — Encounter (HOSPITAL_COMMUNITY): Payer: Self-pay

## 2017-04-10 ENCOUNTER — Inpatient Hospital Stay (HOSPITAL_COMMUNITY)
Admission: AD | Admit: 2017-04-10 | Discharge: 2017-04-10 | Disposition: A | Discharge status: Home / Self Care | Payer: 59 | Source: Ambulatory Visit | Attending: Obstetrics & Gynecology | Admitting: Obstetrics & Gynecology

## 2017-04-10 DIAGNOSIS — O1002 Pre-existing essential hypertension complicating childbirth: Secondary | ICD-10-CM | POA: Diagnosis not present

## 2017-04-10 DIAGNOSIS — O10919 Unspecified pre-existing hypertension complicating pregnancy, unspecified trimester: Secondary | ICD-10-CM

## 2017-04-10 DIAGNOSIS — O479 False labor, unspecified: Secondary | ICD-10-CM

## 2017-04-10 LAB — CBC WITH DIFFERENTIAL/PLATELET
BASOS ABS: 0 10*3/uL (ref 0.0–0.1)
Basophils Relative: 0 %
Eosinophils Absolute: 0.1 10*3/uL (ref 0.0–0.7)
Eosinophils Relative: 1 %
HEMATOCRIT: 34 % — AB (ref 36.0–46.0)
Hemoglobin: 11.3 g/dL — ABNORMAL LOW (ref 12.0–15.0)
Lymphocytes Relative: 19 %
Lymphs Abs: 1.6 10*3/uL (ref 0.7–4.0)
MCH: 26.5 pg (ref 26.0–34.0)
MCHC: 33.2 g/dL (ref 30.0–36.0)
MCV: 79.8 fL (ref 78.0–100.0)
Monocytes Absolute: 0.9 10*3/uL (ref 0.1–1.0)
Monocytes Relative: 11 %
NEUTROS ABS: 6 10*3/uL (ref 1.7–7.7)
NEUTROS PCT: 70 %
Platelets: 242 10*3/uL (ref 150–400)
RBC: 4.26 MIL/uL (ref 3.87–5.11)
RDW: 14.3 % (ref 11.5–15.5)
WBC: 8.7 10*3/uL (ref 4.0–10.5)

## 2017-04-10 LAB — URINALYSIS, ROUTINE W REFLEX MICROSCOPIC
Bilirubin Urine: NEGATIVE
Glucose, UA: NEGATIVE mg/dL
Hgb urine dipstick: NEGATIVE
Ketones, ur: NEGATIVE mg/dL
LEUKOCYTES UA: NEGATIVE
NITRITE: NEGATIVE
PROTEIN: NEGATIVE mg/dL
Specific Gravity, Urine: 1.013 (ref 1.005–1.030)
pH: 7 (ref 5.0–8.0)

## 2017-04-10 LAB — COMPREHENSIVE METABOLIC PANEL
ALBUMIN: 2.8 g/dL — AB (ref 3.5–5.0)
ALT: 15 U/L (ref 14–54)
AST: 19 U/L (ref 15–41)
Alkaline Phosphatase: 95 U/L (ref 38–126)
Anion gap: 8 (ref 5–15)
BILIRUBIN TOTAL: 0.4 mg/dL (ref 0.3–1.2)
BUN: 7 mg/dL (ref 6–20)
CHLORIDE: 106 mmol/L (ref 101–111)
CO2: 20 mmol/L — ABNORMAL LOW (ref 22–32)
CREATININE: 0.64 mg/dL (ref 0.44–1.00)
Calcium: 9.2 mg/dL (ref 8.9–10.3)
GFR calc Af Amer: >60 mL/min (ref >60)
GLUCOSE: 111 mg/dL — AB (ref 65–99)
POTASSIUM: 3.8 mmol/L (ref 3.5–5.1)
Sodium: 134 mmol/L — ABNORMAL LOW (ref 135–145)
TOTAL PROTEIN: 6.5 g/dL (ref 6.5–8.1)

## 2017-04-10 LAB — PROTEIN / CREATININE RATIO, URINE
CREATININE, URINE: 68 mg/dL
Protein Creatinine Ratio: 0.22 mg/mg{Cre} — ABNORMAL HIGH (ref 0.00–0.15)
Total Protein, Urine: 15 mg/dL

## 2017-04-10 NOTE — MAU Note (Signed)
Pt reports contractions since 10 pm, denies bleeding or ROM

## 2017-04-10 NOTE — Discharge Instructions (Signed)

## 2017-04-11 ENCOUNTER — Other Ambulatory Visit: Payer: Self-pay | Admitting: Obstetrics and Gynecology

## 2017-04-11 ENCOUNTER — Ambulatory Visit (INDEPENDENT_AMBULATORY_CARE_PROVIDER_SITE_OTHER): Payer: 59 | Admitting: Obstetrics and Gynecology

## 2017-04-11 ENCOUNTER — Telehealth (HOSPITAL_COMMUNITY): Payer: Self-pay | Admitting: *Deleted

## 2017-04-11 ENCOUNTER — Encounter: Payer: Self-pay | Admitting: Obstetrics and Gynecology

## 2017-04-11 ENCOUNTER — Ambulatory Visit (INDEPENDENT_AMBULATORY_CARE_PROVIDER_SITE_OTHER): Payer: 59

## 2017-04-11 VITALS — BP 138/78 | HR 80 | Wt 296.4 lb

## 2017-04-11 DIAGNOSIS — Z1389 Encounter for screening for other disorder: Secondary | ICD-10-CM

## 2017-04-11 DIAGNOSIS — O10913 Unspecified pre-existing hypertension complicating pregnancy, third trimester: Secondary | ICD-10-CM

## 2017-04-11 DIAGNOSIS — Z331 Pregnant state, incidental: Secondary | ICD-10-CM

## 2017-04-11 DIAGNOSIS — O10919 Unspecified pre-existing hypertension complicating pregnancy, unspecified trimester: Secondary | ICD-10-CM

## 2017-04-11 LAB — POCT URINALYSIS DIPSTICK
Glucose, UA: NEGATIVE
Ketones, UA: NEGATIVE
NITRITE UA: NEGATIVE
Protein, UA: NEGATIVE
RBC UA: NEGATIVE

## 2017-04-11 NOTE — Progress Notes (Signed)
Patient ID: Patricia [name], female   DOB: [birth year], 26 y.o.   MRN: [id number] Fetal Surveillance Testing today:  BPP   High Risk Pregnancy Diagnosis(es):   CHTN  G3P1011 1465w5d Estimated Date of Delivery: 04/20/17  Blood pressure 138/78, pulse 80, weight 296 lb 6.4 oz (134.4 kg), last menstrual period 07/22/2016, unknown if currently breastfeeding.  Urinalysis: trace of leukocytes, otherwise negative   HPI: The patient is being seen today for ongoing management of CHTN. Today she reports no complaints at this time. Pt notes that she is getting the nexplanon following delivery. Denies any other symptoms.    BP weight and urine results all reviewed and noted. Patient reports good fetal movement, denies any bleeding and no rupture of membranes symptoms or regular contractions.  Fundal Height: 39 cm Fetal Heart rate: 154 per US Edema:   Patient is without complaints other than noted in her HPI. All questions were answered.  All lab and sonogram results have been reviewed. Comments: normal  Assessment:  1.  Pregnancy at 6265w5d,  Estimated Date of Delivery: 04/20/17 :for IOL on 5/23                        2.  CHTN  Medication(s) Plans:   Treatment Plan: follow up in 1 week following delivery for blood pressure check   No Follow-up on file. for appointment for high risk OB care  No orders of the defined types were placed in this encounter.  Orders Placed This Encounter  Procedures  . POCT urinalysis dipstick   By signing my name below, I, Soijett Blue, attest that this documentation has been prepared under the direction and in the presence of Tilda BurrowFerguson, Gaspare Netzel V, MD. Electronically Signed: Soijett Blue, ED Scribe. 04/11/17. 3:49 PM.  I personally performed the services described in this documentation, which was SCRIBED in my presence. The recorded information has been reviewed and considered accurate. It has been edited as necessary during review. Tilda BurrowFERGUSON,Tiler Brandis V, MD

## 2017-04-11 NOTE — Progress Notes (Signed)
US 38+5 wks,cephalic,BPP 8/8,ant.pl gr 3,FHR 154 bpm,afi 11.8 cm,RI .62.Marland Kitchen.59.   74%

## 2017-04-11 NOTE — Telephone Encounter (Signed)
Preadmission screen  

## 2017-04-13 ENCOUNTER — Inpatient Hospital Stay (HOSPITAL_COMMUNITY)
Admission: RE | Admit: 2017-04-13 | Discharge: 2017-04-16 | DRG: 774 | Disposition: A | Discharge status: Home / Self Care | Payer: 59 | Source: Ambulatory Visit | Attending: Obstetrics & Gynecology | Admitting: Obstetrics & Gynecology

## 2017-04-13 ENCOUNTER — Encounter (HOSPITAL_COMMUNITY): Payer: Self-pay

## 2017-04-13 DIAGNOSIS — Z888 Allergy status to other drugs, medicaments and biological substances status: Secondary | ICD-10-CM | POA: Diagnosis not present

## 2017-04-13 DIAGNOSIS — Z8249 Family history of ischemic heart disease and other diseases of the circulatory system: Secondary | ICD-10-CM | POA: Diagnosis not present

## 2017-04-13 DIAGNOSIS — O1002 Pre-existing essential hypertension complicating childbirth: Secondary | ICD-10-CM | POA: Diagnosis present

## 2017-04-13 DIAGNOSIS — O1092 Unspecified pre-existing hypertension complicating childbirth: Secondary | ICD-10-CM | POA: Diagnosis not present

## 2017-04-13 DIAGNOSIS — Z7982 Long term (current) use of aspirin: Secondary | ICD-10-CM | POA: Diagnosis not present

## 2017-04-13 DIAGNOSIS — Z3A39 39 weeks gestation of pregnancy: Secondary | ICD-10-CM

## 2017-04-13 DIAGNOSIS — Z823 Family history of stroke: Secondary | ICD-10-CM

## 2017-04-13 DIAGNOSIS — O10919 Unspecified pre-existing hypertension complicating pregnancy, unspecified trimester: Secondary | ICD-10-CM | POA: Diagnosis present

## 2017-04-13 DIAGNOSIS — Z881 Allergy status to other antibiotic agents status: Secondary | ICD-10-CM | POA: Diagnosis not present

## 2017-04-13 HISTORY — DX: Unspecified asthma, uncomplicated: J45.909

## 2017-04-13 LAB — PROTEIN / CREATININE RATIO, URINE
Creatinine, Urine: 238 mg/dL
PROTEIN CREATININE RATIO: 0.24 mg/mg{creat} — AB (ref 0.00–0.15)
Total Protein, Urine: 57 mg/dL

## 2017-04-13 LAB — COMPREHENSIVE METABOLIC PANEL
ALBUMIN: 2.7 g/dL — AB (ref 3.5–5.0)
ALK PHOS: 93 U/L (ref 38–126)
ALT: 16 U/L (ref 14–54)
ANION GAP: 8 (ref 5–15)
AST: 23 U/L (ref 15–41)
BUN: 7 mg/dL (ref 6–20)
CALCIUM: 9.3 mg/dL (ref 8.9–10.3)
CHLORIDE: 106 mmol/L (ref 101–111)
CO2: 21 mmol/L — AB (ref 22–32)
Creatinine, Ser: 0.55 mg/dL (ref 0.44–1.00)
GFR calc non Af Amer: >60 mL/min (ref >60)
GLUCOSE: 133 mg/dL — AB (ref 65–99)
Potassium: 4.1 mmol/L (ref 3.5–5.1)
SODIUM: 135 mmol/L (ref 135–145)
Total Bilirubin: 0.3 mg/dL (ref 0.3–1.2)
Total Protein: 5.8 g/dL — ABNORMAL LOW (ref 6.5–8.1)

## 2017-04-13 LAB — CBC
HEMATOCRIT: 34.5 % — AB (ref 36.0–46.0)
HEMOGLOBIN: 11.4 g/dL — AB (ref 12.0–15.0)
MCH: 26.6 pg (ref 26.0–34.0)
MCHC: 33 g/dL (ref 30.0–36.0)
MCV: 80.4 fL (ref 78.0–100.0)
Platelets: 251 10*3/uL (ref 150–400)
RBC: 4.29 MIL/uL (ref 3.87–5.11)
RDW: 14.5 % (ref 11.5–15.5)
WBC: 8.3 10*3/uL (ref 4.0–10.5)

## 2017-04-13 LAB — TYPE AND SCREEN
ABO/RH(D): A POS
Antibody Screen: NEGATIVE

## 2017-04-13 LAB — RPR: RPR: NONREACTIVE

## 2017-04-13 MED ORDER — ONDANSETRON HCL 4 MG/2ML IJ SOLN: 4.0000 mg | Freq: Four times a day (QID) | INTRAMUSCULAR | Status: DC | PRN

## 2017-04-13 MED ORDER — OXYTOCIN BOLUS FROM INFUSION: 500.0000 mL | Freq: Once | INTRAVENOUS | Status: DC

## 2017-04-13 MED ORDER — LABETALOL HCL 5 MG/ML IV SOLN
20.0000 mg | INTRAVENOUS | Status: DC | PRN
  Administered 2017-04-13: 20 mg via INTRAVENOUS
  Administered 2017-04-13: 40 mg via INTRAVENOUS
  Filled 2017-04-13: qty 8

## 2017-04-13 MED ORDER — LABETALOL HCL 5 MG/ML IV SOLN
INTRAVENOUS | Status: AC
  Administered 2017-04-13: 20 mg via INTRAVENOUS
  Filled 2017-04-13: qty 4

## 2017-04-13 MED ORDER — OXYCODONE-ACETAMINOPHEN 5-325 MG PO TABS: 2.0000 | ORAL_TABLET | ORAL | Status: DC | PRN

## 2017-04-13 MED ORDER — OXYTOCIN 40 UNITS IN LACTATED RINGERS INFUSION - SIMPLE MED: 2.5000 [IU]/h | INTRAVENOUS | Status: DC

## 2017-04-13 MED ORDER — OXYCODONE-ACETAMINOPHEN 5-325 MG PO TABS: 1.0000 | ORAL_TABLET | ORAL | Status: DC | PRN

## 2017-04-13 MED ORDER — MISOPROSTOL 25 MCG QUARTER TABLET
25.0000 ug | ORAL_TABLET | ORAL | Status: DC | PRN
  Administered 2017-04-13: 25 ug via VAGINAL
  Filled 2017-04-13 (×2): qty 1

## 2017-04-13 MED ORDER — TERBUTALINE SULFATE 1 MG/ML IJ SOLN: 0.2500 mg | Freq: Once | INTRAMUSCULAR | Status: DC | PRN

## 2017-04-13 MED ORDER — LABETALOL HCL 200 MG PO TABS
200.0000 mg | ORAL_TABLET | Freq: Two times a day (BID) | ORAL | Status: DC
  Administered 2017-04-13 – 2017-04-14 (×4): 200 mg via ORAL
  Filled 2017-04-13 (×4): qty 1

## 2017-04-13 MED ORDER — LACTATED RINGERS IV SOLN: 500.0000 mL | INTRAVENOUS | Status: DC | PRN

## 2017-04-13 MED ORDER — ACETAMINOPHEN 325 MG PO TABS: 650.0000 mg | ORAL_TABLET | ORAL | Status: DC | PRN

## 2017-04-13 MED ORDER — LACTATED RINGERS IV SOLN
INTRAVENOUS | Status: DC
  Administered 2017-04-14 (×2): via INTRAVENOUS

## 2017-04-13 MED ORDER — OXYTOCIN BOLUS FROM INFUSION
500.0000 mL | Freq: Once | INTRAVENOUS | Status: AC
  Administered 2017-04-14: 500 mL via INTRAVENOUS

## 2017-04-13 MED ORDER — MISOPROSTOL 50MCG HALF TABLET
50.0000 ug | ORAL_TABLET | Freq: Once | ORAL | Status: AC
  Administered 2017-04-13: 50 ug via ORAL
  Filled 2017-04-13: qty 1

## 2017-04-13 MED ORDER — FLEET ENEMA 7-19 GM/118ML RE ENEM: 1.0000 | ENEMA | RECTAL | Status: DC | PRN

## 2017-04-13 MED ORDER — LIDOCAINE HCL (PF) 1 % IJ SOLN
30.0000 mL | INTRAMUSCULAR | Status: DC | PRN
  Filled 2017-04-13: qty 30

## 2017-04-13 MED ORDER — SOD CITRATE-CITRIC ACID 500-334 MG/5ML PO SOLN
30.0000 mL | ORAL | Status: DC | PRN
  Filled 2017-04-13: qty 15

## 2017-04-13 MED ORDER — FENTANYL CITRATE (PF) 100 MCG/2ML IJ SOLN
100.0000 ug | INTRAMUSCULAR | Status: DC | PRN
  Administered 2017-04-14: 100 ug via INTRAVENOUS
  Filled 2017-04-13: qty 2

## 2017-04-13 MED ORDER — OXYTOCIN 40 UNITS IN LACTATED RINGERS INFUSION - SIMPLE MED
1.0000 m[IU]/min | INTRAVENOUS | Status: DC
  Administered 2017-04-13: 2 m[IU]/min via INTRAVENOUS
  Administered 2017-04-14: 18 m[IU]/min via INTRAVENOUS

## 2017-04-13 MED ORDER — HYDRALAZINE HCL 20 MG/ML IJ SOLN: 10.0000 mg | Freq: Once | INTRAMUSCULAR | Status: DC | PRN

## 2017-04-13 MED ORDER — MISOPROSTOL 200 MCG PO TABS
50.0000 ug | ORAL_TABLET | ORAL | Status: DC
  Administered 2017-04-13: 50 ug via ORAL
  Filled 2017-04-13: qty 1

## 2017-04-13 MED ORDER — TERBUTALINE SULFATE 1 MG/ML IJ SOLN
0.2500 mg | Freq: Once | INTRAMUSCULAR | Status: DC | PRN
  Filled 2017-04-13: qty 1

## 2017-04-13 MED ORDER — LIDOCAINE HCL (PF) 1 % IJ SOLN: 30.0000 mL | INTRAMUSCULAR | Status: DC | PRN

## 2017-04-13 MED ORDER — SOD CITRATE-CITRIC ACID 500-334 MG/5ML PO SOLN
30.0000 mL | ORAL | Status: DC | PRN
  Administered 2017-04-13: 30 mL via ORAL
  Filled 2017-04-13: qty 15

## 2017-04-13 MED ORDER — OXYTOCIN 40 UNITS IN LACTATED RINGERS INFUSION - SIMPLE MED
2.5000 [IU]/h | INTRAVENOUS | Status: DC
  Filled 2017-04-13 (×2): qty 1000

## 2017-04-13 MED ORDER — LACTATED RINGERS IV SOLN
INTRAVENOUS | Status: DC
  Administered 2017-04-13: 08:00:00 via INTRAVENOUS

## 2017-04-13 NOTE — Progress Notes (Signed)
Labor Progress Note  Patricia [name] is a 26 y.o. G3P1011 at 3161w0d  admitted for induction of labor due to Hypertension, chronic.  S: Still with painful contractions though wants to continue with nitrous oxide. Amenable to starting pit if no change with next cervix check at 2300.   O:  BP (!) 166/64   Pulse 92   Temp 98.1 F (36.7 C) (Oral)   Resp 18   Ht 5\' 5"  (1.651 m)   Wt 296 lb (134.3 kg)   LMP 07/22/2016 (Exact Date)   BMI 49.26 kg/m   No intake/output data recorded.  FHT:  FHR: 140 bpm, variability: minimal ,  accelerations:  Present,  decelerations:  Absent SVE:   Dilation: 3.5 Effacement (%): 60, 70 Station: -2 Exam by:: Melburn PopperMichelle Williams, RN SROM/AROM: intact   Labs: Lab Results  Component Value Date   WBC 8.3 04/13/2017   HGB 11.4 (L) 04/13/2017   HCT 34.5 (L) 04/13/2017   MCV 80.4 04/13/2017   PLT 251 04/13/2017    Assessment / Plan: 26 y.o. G3P1011 8361w0d presenting for IOL for cHTN in early labor. Augmentation of labor, progressing well  Labor: Progressing normally. Will consider beginning pit if patient unchanged at next cervical check at 2300.  Fetal Wellbeing:  Category I Pain Control:  Nitrous Oxide Anticipated MOD:  NSVD  Expectant management   Tarri AbernethyAbigail J Montreal Steidle, MD, MPH PGY-2 [facility]

## 2017-04-13 NOTE — Progress Notes (Signed)
LABOR ADMISSION HISTORY AND PHYSICAL  Patricia [name] is a 26 y.o. female G3P1011 with IUP at 6357w0d by 8 week ultrasound presenting for IOL for chronic HTN. She reports +FM, no contractions, No LOF, no VB, no blurry vision, headaches or peripheral edema, no RUQ pain.  She plans on breast feeding. She request nexplanon for birth control.  She reports her blood pressures have been controlled throughout pregnancy with labetalol.   Prenatal History/Complications:  Past Medical History: Past Medical History:  Diagnosis Date  . Cough 08/01/2015  . Migraine   . Migraines   . Nausea and vomiting during pregnancy prior to [redacted] weeks gestation 08/01/2015  . PCO (polycystic ovaries) 03/08/2014  . Polycystic disease, ovaries   . Pregnancy induced hypertension   . Pregnant 05/27/2015  . URI (upper respiratory infection) 08/01/2015    Past Surgical History: Past Surgical History:  Procedure Laterality Date  . TONSILLECTOMY    . WISDOM TOOTH EXTRACTION      Obstetrical History: OB History    Gravida Para Term Preterm AB Living   3 1 1   1 1    SAB TAB Ectopic Multiple Live Births   1     0 1      Social History: Social History   Social History  . Marital status: Single    Spouse name: N/A  . Number of children: N/A  . Years of education: N/A   Social History Main Topics  . Smoking status: Never Smoker  . Smokeless tobacco: Never Used  . Alcohol use No     Comment: occassional  . Drug use: No  . Sexual activity: Yes    Birth control/ protection: None   Other Topics Concern  . None   Social History Narrative  . None    Family History: Family History  Problem Relation Age of Onset  . Migraines Sister   . Hypertension Brother   . Heart disease Maternal Grandmother   . Cancer Maternal Grandfather        prostate  . Stroke Paternal Grandmother   . Cancer Paternal Grandfather        prostate  . Stroke Paternal Grandfather   . Atrial fibrillation Father      Allergies: Allergies  Allergen Reactions  . Peanut-Containing Drug Products Anaphylaxis and Hives  . Propranolol Shortness Of Breath  . Cephalexin Hives  . Imitrex [Sumatriptan] Hives    Prescriptions Prior to Admission  Medication Sig Dispense Refill Last Dose  . aspirin EC 81 MG tablet Take 81 mg by mouth daily.   Taking  . Doxylamine-Pyridoxine (DICLEGIS) 10-10 MG TBEC Take 2 tablets by mouth at bedtime as needed (for nausea).   Taking  . labetalol (NORMODYNE) 200 MG tablet Take 1 tablet (200 mg total) by mouth 2 (two) times daily. 60 tablet 3 Taking  . omeprazole (PRILOSEC) 10 MG capsule Take 1 capsule (10 mg total) by mouth daily. 30 capsule 6 Taking  . Prenat-FeBis-FePro-FA-CA-Omega (COMPLETE NATAL DHA) 29-1-200 & 250 MG MISC Take 1 tablet by mouth every evening.   Taking  . PROAIR HFA 108 (90 Base) MCG/ACT inhaler Inhale 2 puffs into the lungs every 4 (four) hours as needed for wheezing or shortness of breath. 1 Inhaler 4 Taking     Review of Systems   All systems reviewed and negative except as stated in HPI  BP 128/72   Pulse 93   Temp 98 F (36.7 C) (Oral)   Resp 18   Ht 5'  5" (1.651 m)   Wt 296 lb (134.3 kg)   LMP 07/22/2016 (Exact Date)   BMI 49.26 kg/m  General appearance: alert, cooperative, appears stated age and no distress Lungs: clear to auscultation bilaterally Heart: regular rate and rhythm Abdomen: soft, non-tender; bowel sounds normal Extremities: Homans sign is negative, no sign of DVT, edema  Fetal monitoringBaseline: 130 bpm, moderate variability, accels, no decels Uterine activity: Occasional contractions    Prenatal labs: ABO, Rh: --/--/A POS (05/23 0755) Antibody: NEG (05/23 0755) Rubella: Immune RPR: Non Reactive (03/06 0859)  HBsAg: Negative (11/06 1636)  HIV: Non Reactive (03/06 0859)  GBS: Negative (05/07 1645)  2 hr Glucola normal Genetic screening  Informaseq (12wks): abnormal, normal panorma w/ genetic counseling  AFP  (16wks pe LHE): nl Anatomy US Normal Female  Prenatal Transfer Tool  Maternal Diabetes: No Genetic Screening:  Informaseq (12wks): abnormal, normal panorma w/ genetic counseling  AFP (16wks pe LHE): nl Maternal Ultrasounds/Referrals: Normal Fetal Ultrasounds or other Referrals:  None Maternal Substance Abuse:  No Significant Maternal Medications:  Meds include: Other: labetolol Significant Maternal Lab Results: Lab values include: Group B Strep negative  Results for orders placed or performed during the hospital encounter of 04/13/17 (from the past 24 hour(s))  CBC   Collection Time: 04/13/17  7:55 AM  Result Value Ref Range   WBC 8.3 4.0 - 10.5 K/uL   RBC 4.29 3.87 - 5.11 MIL/uL   Hemoglobin 11.4 (L) 12.0 - 15.0 g/dL   HCT 16.1 (L) 09.6 - 04.5 %   MCV 80.4 78.0 - 100.0 fL   MCH 26.6 26.0 - 34.0 pg   MCHC 33.0 30.0 - 36.0 g/dL   RDW 40.9 81.1 - 91.4 %   Platelets 251 150 - 400 K/uL  Type and screen   Collection Time: 04/13/17  7:55 AM  Result Value Ref Range   ABO/RH(D) A POS    Antibody Screen NEG    Sample Expiration 04/16/2017     Patient Active Problem List   Diagnosis Date Noted  . 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 Chronic hypertension in pregnancy 04/13/2017  . Chronic hypertension affecting pregnancy 11/08/2016  . 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 Supervision of high risk pregnancy, antepartum 09/27/2016  . History of gestational hypertension 09/27/2016  . History of shoulder dystocia in prior pregnancy, currently pregnant 09/27/2016  . URI (upper respiratory infection) 08/01/2015  . Persistent cough 08/01/2015  . Headache 09/23/2014  . PCO (polycystic ovaries) 03/08/2014    Assessment: Patricia [name] is a 26 y.o. G3P1011 at [redacted]w[redacted]d here for IOL for chronic HTN controlled on labetolol.  #Labor:Initiate cytotec q4H per vagina #Pain: Well controlled, start with IV meds #FWB: Category I  tracing #ID:  GBS negative #MOF: Breast #MOC:Nexplanon #Circ:  Inpatient # cHTN: check CMP, CBC continue labetolol and plan to transition norvasc post partum.   Patricia Pouch, MD PGY-1 [facility]  OB FELLOW HISTORY AND PHYSICAL ATTESTATION  I confirm that I have verified the information documented in the resident's note and that I have also personally reperformed the physical exam and all medical decision making activities.  Ernestina Penna 04/13/2017, 11:08 AM

## 2017-04-13 NOTE — Progress Notes (Signed)
Labor Progress Note  Patricia [name] is a [age] y.o. G3P1011 at 2732w0d  admitted for induction of labor due to Hypertension, chronic.  S: Patient doing well. Is feeling pain with contractions. Pain manageable currently with nitrous oxide. Foley bulb out.   O:  BP (!) 150/91   Pulse 96   Temp 98.1 F (36.7 C) (Oral)   Resp 20   Ht 5\' 5"  (1.651 m)   Wt 296 lb (134.3 kg)   LMP 07/22/2016 (Exact Date)   BMI 49.26 kg/m   No intake/output data recorded.  FHT:  FHR: 140 bpm, variability: minimal ,  accelerations:  Present,  decelerations:  Absent SVE:   Dilation: 3.5 Effacement (%): 60, 70 Station: -2 Exam by:: Melburn PopperMichelle Williams, RN SROM/AROM: intact   Labs: Lab Results  Component Value Date   WBC 8.3 04/13/2017   HGB 11.4 (L) 04/13/2017   HCT 34.5 (L) 04/13/2017   MCV 80.4 04/13/2017   PLT 251 04/13/2017    Assessment / Plan: 26 y.o. G3P1011 2752w0d presenting for IOL for cHTN in early labor. Augmentation of labor, progressing well  Labor: Progressing normally. Foley out, however received cytotec at 1900. Consider beginning pitocin around 2300 if necessary and patient agreeable (patient with noted aversion to pitocin unless absolutely necessary) Fetal Wellbeing:  Category I Pain Control:  Nitrous Oxide Anticipated MOD:  NSVD  Expectant management   Tarri AbernethyAbigail J Osha Errico, MD, MPH PGY-2 [facility]

## 2017-04-13 NOTE — Anesthesia Pain Management Evaluation Note (Signed)
  CRNA Pain Management Visit Note  Patient: [name], 26 y.o., female  "Hello I am a [facility]. We have an anesthesia team available at all times to provide care throughout the hospital, including epidural management and anesthesia for C-section. I don't know your plan for the delivery whether it a natural birth, water birth, IV sedation, nitrous supplementation, doula or epidural, but we want to meet your pain goals."   1.Was your pain managed to your expectations on prior hospitalizations?   Yes   2.What is your expectation for pain management during this hospitalization?     Labor support without medications  3.How can we help you reach that goal?   Record the patient's initial score and the patient's pain goal.   Pain: 0  Pain Goal: 10 [facility] wants you to be able to say your pain was always managed very well.  Laban EmperorMalinova,Kathern Lobosco Hristova 04/13/2017

## 2017-04-13 NOTE — H&P (Signed)
LABOR ADMISSION HISTORY AND PHYSICAL  Patricia [name] is a 26 y.o. female G3P1011 with IUP at [redacted]w[redacted]d by 8 week ultrasound presenting for IOL for chronic HTN. She reports +FM, no contractions, No LOF, no VB, no blurry vision, headaches or peripheral edema, no RUQ pain.  She plans on breast feeding. She request nexplanon for birth control.  She reports her blood pressures have been controlled throughout pregnancy with labetalol.   Prenatal History/Complications:  Past Medical History:     Past Medical History:  Diagnosis Date  . Cough 08/01/2015  . Migraine   . Migraines   . Nausea and vomiting during pregnancy prior to [redacted] weeks gestation 08/01/2015  . PCO (polycystic ovaries) 03/08/2014  . Polycystic disease, ovaries   . Pregnancy induced hypertension   . Pregnant 05/27/2015  . URI (upper respiratory infection) 08/01/2015    Past Surgical History:      Past Surgical History:  Procedure Laterality Date  . TONSILLECTOMY    . WISDOM TOOTH EXTRACTION      Obstetrical History:         OB History    Gravida Para Term Preterm AB Living   3 1 1   1 1    SAB TAB Ectopic Multiple Live Births   1     0 1      Social History: Social History        Social History  . Marital status: Single    Spouse name: N/A  . Number of children: N/A  . Years of education: N/A         Social History Main Topics  . Smoking status: Never Smoker  . Smokeless tobacco: Never Used  . Alcohol use No     Comment: occassional  . Drug use: No  . Sexual activity: Yes    Birth control/ protection: None       Other Topics Concern  . None      Social History Narrative  . None    Family History:      Family History  Problem Relation Age of Onset  . Migraines Sister   . Hypertension Brother   . Heart disease Maternal Grandmother   . Cancer Maternal Grandfather        prostate  . Stroke Paternal Grandmother   . Cancer Paternal Grandfather         prostate  . Stroke Paternal Grandfather   . Atrial fibrillation Father     Allergies:     Allergies  Allergen Reactions  . Peanut-Containing Drug Products Anaphylaxis and Hives  . Propranolol Shortness Of Breath  . Cephalexin Hives  . Imitrex [Sumatriptan] Hives           Prescriptions Prior to Admission  Medication Sig Dispense Refill Last Dose  . aspirin EC 81 MG tablet Take 81 mg by mouth daily.   Taking  . Doxylamine-Pyridoxine (DICLEGIS) 10-10 MG TBEC Take 2 tablets by mouth at bedtime as needed (for nausea).   Taking  . labetalol (NORMODYNE) 200 MG tablet Take 1 tablet (200 mg total) by mouth 2 (two) times daily. 60 tablet 3 Taking  . omeprazole (PRILOSEC) 10 MG capsule Take 1 capsule (10 mg total) by mouth daily. 30 capsule 6 Taking  . Prenat-FeBis-FePro-FA-CA-Omega (COMPLETE NATAL DHA) 29-1-200 & 250 MG MISC Take 1 tablet by mouth every evening.   Taking  . PROAIR HFA 108 (90 Base) MCG/ACT inhaler Inhale 2 puffs into the lungs every 4 (four) hours as needed for  wheezing or shortness of breath. 1 Inhaler 4 Taking     Review of Systems   All systems reviewed and negative except as stated in HPI  BP 128/72   Pulse 93   Temp 98 F (36.7 C) (Oral)   Resp 18   Ht 5\' 5"  (1.651 m)   Wt 296 lb (134.3 kg)   LMP 07/22/2016 (Exact Date)   BMI 49.26 kg/m  General appearance: alert, cooperative, appears stated age and no distress Lungs: clear to auscultation bilaterally Heart: regular rate and rhythm Abdomen: soft, non-tender; bowel sounds normal Extremities: Homans sign is negative, no sign of DVT, edema  Fetal monitoringBaseline: 130 bpm, moderate variability, accels, no decels Uterine activity: Occasional contractions  Prenatal labs: ABO, Rh: --/--/A POS (05/23 0755) Antibody: NEG (05/23 0755) Rubella: Immune RPR: Non Reactive (03/06 0859)  HBsAg: Negative (11/06 1636)  HIV: Non Reactive (03/06 0859)  GBS: Negative (05/07 1645)  2 hr Glucola  normal Genetic screening  Informaseq (12wks): abnormal, normal panorma w/ genetic counseling  AFP (16wks pe LHE): nl Anatomy US Normal Female  Prenatal Transfer Tool  Maternal Diabetes: No Genetic Screening:  Informaseq (12wks): abnormal, normal panorma w/ genetic counseling  AFP (16wks pe LHE): nl Maternal Ultrasounds/Referrals: Normal Fetal Ultrasounds or other Referrals:  None Maternal Substance Abuse:  No Significant Maternal Medications:  Meds include: Other: labetolol Significant Maternal Lab Results: Lab values include: Group B Strep negative       Results for orders placed or performed during the hospital encounter of 04/13/17 (from the past 24 hour(s))  CBC   Collection Time: 04/13/17  7:55 AM  Result Value Ref Range   WBC 8.3 4.0 - 10.5 K/uL   RBC 4.29 3.87 - 5.11 MIL/uL   Hemoglobin 11.4 (L) 12.0 - 15.0 g/dL   HCT 16.134.5 (L) 09.636.0 - 04.546.0 %   MCV 80.4 78.0 - 100.0 fL   MCH 26.6 26.0 - 34.0 pg   MCHC 33.0 30.0 - 36.0 g/dL   RDW 40.914.5 81.111.5 - 91.415.5 %   Platelets 251 150 - 400 K/uL  Type and screen   Collection Time: 04/13/17  7:55 AM  Result Value Ref Range   ABO/RH(D) A POS    Antibody Screen NEG    Sample Expiration 04/16/2017         Patient Active Problem List   Diagnosis Date Noted  . 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 Chronic hypertension in pregnancy 04/13/2017  . Chronic hypertension affecting pregnancy 11/08/2016  . [facility]  . Supervision of high risk pregnancy, antepartum 09/27/2016  . History of gestational hypertension 09/27/2016  . History of shoulder dystocia in prior pregnancy, currently pregnant 09/27/2016  . URI (upper respiratory infection) 08/01/2015  . Persistent cough 08/01/2015  . Headache 09/23/2014  . PCO (polycystic ovaries) 03/08/2014    Assessment: [name] is a 26 y.o. G3P1011 at 2862w0d here for IOL for chronic HTN  controlled on labetolol.  #Labor:Initiate cytotec q4H per vagina #Pain:  Well controlled, start with IV meds #FWB: Category I tracing #ID:      GBS negative #MOF: Breast #MOC:Nexplanon #Circ:   Inpatient # cHTN: check CMP, CBC continue labetolol and plan to transition norvasc post partum.   Howard PouchLauren Feng, MD PGY-1 [facility]  OB FELLOW HISTORY AND PHYSICAL ATTESTATION  I confirm that I have verified the information documented in the resident's note and that I have also  personally reperformed the physical exam and all medical decision making activities.  Ernestina Penna 04/13/2017, 11:08 AM  .`````Attestation of Attending Supervision of Advanced Practitioner: Evaluation and management procedures were performed by the PA/NP/CNM/OB Fellow under my supervision/collaboration. Chart reviewed and agree with management and plan. Patient well known to me from care at [facility].  Kathelyn Gombos V 04/13/2017 11:41 PM

## 2017-04-13 NOTE — Progress Notes (Signed)
Dr Genevie AnnSchenk notified of BP.  Plans to proceed with PO labetalol as ordered before progressing to gestational hypertension protocol

## 2017-04-13 NOTE — Progress Notes (Signed)
Patient seen & examined for progress of labor. Exam 2/50%/-3. Foley placed @1850 .   FHT: 140 bpm, mod var, +accels, no decels TOCO: Occasional  A/P: - oral cytotec 50 mcg q4H while foley is in - consider starting pitocin once foley comes out - pain controlled with IV meds - Category I strip  Continue expectant management Anticipate SVD

## 2017-04-14 ENCOUNTER — Inpatient Hospital Stay (HOSPITAL_COMMUNITY): Payer: 59 | Admitting: Anesthesiology

## 2017-04-14 ENCOUNTER — Encounter (HOSPITAL_COMMUNITY): Payer: Self-pay

## 2017-04-14 DIAGNOSIS — O1092 Unspecified pre-existing hypertension complicating childbirth: Secondary | ICD-10-CM

## 2017-04-14 DIAGNOSIS — Z3A39 39 weeks gestation of pregnancy: Secondary | ICD-10-CM

## 2017-04-14 LAB — CBC
HEMATOCRIT: 33.7 % — AB (ref 36.0–46.0)
HEMATOCRIT: 34.3 % — AB (ref 36.0–46.0)
Hemoglobin: 11.3 g/dL — ABNORMAL LOW (ref 12.0–15.0)
Hemoglobin: 11.5 g/dL — ABNORMAL LOW (ref 12.0–15.0)
MCH: 27.2 pg (ref 26.0–34.0)
MCH: 27.1 pg (ref 26.0–34.0)
MCHC: 33.5 g/dL (ref 30.0–36.0)
MCHC: 33.5 g/dL (ref 30.0–36.0)
MCV: 81.2 fL (ref 78.0–100.0)
MCV: 80.9 fL (ref 78.0–100.0)
Platelets: 229 10*3/uL (ref 150–400)
Platelets: 219 10*3/uL (ref 150–400)
RBC: 4.15 MIL/uL (ref 3.87–5.11)
RBC: 4.24 MIL/uL (ref 3.87–5.11)
RDW: 14.6 % (ref 11.5–15.5)
RDW: 14.6 % (ref 11.5–15.5)
WBC: 10.2 10*3/uL (ref 4.0–10.5)
WBC: 13.2 10*3/uL — ABNORMAL HIGH (ref 4.0–10.5)

## 2017-04-14 MED ORDER — LACTATED RINGERS IV SOLN: 500.0000 mL | Freq: Once | INTRAVENOUS | Status: DC

## 2017-04-14 MED ORDER — FENTANYL 2.5 MCG/ML BUPIVACAINE 1/10 % EPIDURAL INFUSION (WH - ANES): 14.0000 mL/h | INTRAMUSCULAR | Status: DC | PRN

## 2017-04-14 MED ORDER — PHENYLEPHRINE 40 MCG/ML (10ML) SYRINGE FOR IV PUSH (FOR BLOOD PRESSURE SUPPORT): 80.0000 ug | PREFILLED_SYRINGE | INTRAVENOUS | Status: DC | PRN

## 2017-04-14 MED ORDER — PHENYLEPHRINE 40 MCG/ML (10ML) SYRINGE FOR IV PUSH (FOR BLOOD PRESSURE SUPPORT)
80.0000 ug | PREFILLED_SYRINGE | INTRAVENOUS | Status: DC | PRN
  Filled 2017-04-14: qty 10
  Filled 2017-04-14: qty 5
  Filled 2017-04-14: qty 10

## 2017-04-14 MED ORDER — LIDOCAINE HCL (PF) 1 % IJ SOLN
INTRAMUSCULAR | Status: DC | PRN
  Administered 2017-04-14 (×2): 5 mL via EPIDURAL

## 2017-04-14 MED ORDER — LACTATED RINGERS IV SOLN
500.0000 mL | Freq: Once | INTRAVENOUS | Status: AC
  Administered 2017-04-14: 500 mL via INTRAVENOUS

## 2017-04-14 MED ORDER — DIPHENHYDRAMINE HCL 50 MG/ML IJ SOLN: 12.5000 mg | INTRAMUSCULAR | Status: DC | PRN

## 2017-04-14 MED ORDER — EPHEDRINE 5 MG/ML INJ: 10.0000 mg | INTRAVENOUS | Status: DC | PRN

## 2017-04-14 MED ORDER — BUPIVACAINE HCL (PF) 0.25 % IJ SOLN
INTRAMUSCULAR | Status: DC | PRN
  Administered 2017-04-14: 4 mL via EPIDURAL

## 2017-04-14 MED ORDER — FENTANYL 2.5 MCG/ML BUPIVACAINE 1/10 % EPIDURAL INFUSION (WH - ANES)
14.0000 mL/h | INTRAMUSCULAR | Status: DC | PRN
  Administered 2017-04-14 (×2): 14 mL/h via EPIDURAL
  Filled 2017-04-14 (×2): qty 100

## 2017-04-14 MED ORDER — PHENYLEPHRINE 40 MCG/ML (10ML) SYRINGE FOR IV PUSH (FOR BLOOD PRESSURE SUPPORT)
80.0000 ug | PREFILLED_SYRINGE | INTRAVENOUS | Status: DC | PRN
  Administered 2017-04-14: 80 ug via INTRAVENOUS
  Filled 2017-04-14: qty 5

## 2017-04-14 MED ORDER — EPHEDRINE 5 MG/ML INJ
10.0000 mg | INTRAVENOUS | Status: DC | PRN
  Administered 2017-04-14: 10 mg via INTRAVENOUS
  Filled 2017-04-14: qty 2

## 2017-04-14 MED ORDER — SODIUM BICARBONATE 8.4 % IV SOLN
INTRAVENOUS | Status: DC | PRN
  Administered 2017-04-14: 7 mL via EPIDURAL
  Administered 2017-04-14: 3 mL via EPIDURAL

## 2017-04-14 MED ORDER — IBUPROFEN 600 MG PO TABS
600.0000 mg | ORAL_TABLET | Freq: Four times a day (QID) | ORAL | Status: DC
  Administered 2017-04-14 – 2017-04-16 (×6): 600 mg via ORAL
  Filled 2017-04-14 (×6): qty 1

## 2017-04-14 MED ORDER — EPHEDRINE 5 MG/ML INJ
10.0000 mg | INTRAVENOUS | Status: DC | PRN
  Administered 2017-04-14: 10 mg via INTRAVENOUS
  Filled 2017-04-14: qty 2
  Filled 2017-04-14: qty 4

## 2017-04-14 NOTE — Progress Notes (Signed)
S: Patient seen & examined for progress of labor. Using nitrous for pain control.   O:  Vitals:   04/14/17 1524 04/14/17 1525 04/14/17 1529 04/14/17 1531  BP:  134/73  137/63  Pulse:  90  89  Resp:  20    Temp:      TempSrc:      SpO2: 96%  99%   Weight:      Height:        Dilation: 5.5 Effacement (%): 80 Cervical Position: Posterior Station: -3 Presentation: Vertex Exam by:: Dr. Genevie AnnSchenk   FHT: 120 bpm, mod var, +accels, no decels TOCO: q43min   A/P: Category 1 tracing Pain controlled with nitrous oxice Labor progressing normally   Continue expectant management Anticipate SVD

## 2017-04-14 NOTE — Anesthesia Procedure Notes (Signed)
Epidural Patient location during procedure: OB Start time: 04/14/2017 3:09 PM End time: 04/14/2017 3:18 PM  Staffing Anesthesiologist: Chaney MallingHODIERNE, Sanja Elizardo Performed: anesthesiologist   Preanesthetic Checklist Completed: patient identified, site marked, pre-op evaluation, timeout performed, IV checked, risks and benefits discussed and monitors and equipment checked  Epidural Patient position: sitting Prep: DuraPrep Patient monitoring: heart rate, cardiac monitor, continuous pulse ox and blood pressure Approach: midline Location: L2-L3 Injection technique: LOR saline  Needle:  Needle type: Tuohy  Needle gauge: 17 G Needle length: 9 cm Needle insertion depth: 8 cm Catheter type: closed end flexible Catheter size: 19 Gauge Catheter at skin depth: 14 cm Test dose: negative and Other  Assessment Events: blood not aspirated, injection not painful, no injection resistance and negative IV test  Additional Notes Informed consent obtained prior to proceeding including risk of failure, 1% risk of PDPH, risk of minor discomfort and bruising.  Discussed rare but serious complications including epidural abscess, permanent nerve injury, epidural hematoma.  Discussed alternatives to epidural analgesia and patient desires to proceed.  Timeout performed pre-procedure verifying patient name, procedure, and platelet count.  Patient tolerated procedure well. Reason for block:procedure for pain

## 2017-04-14 NOTE — Anesthesia Preprocedure Evaluation (Signed)
Anesthesia Evaluation  Patient identified by MRN, date of birth, ID band Patient awake    Reviewed: Allergy & Precautions, H&P , NPO status , Patient's Chart, lab work & pertinent test results  Airway Mallampati: II   Neck ROM: full    Dental   Pulmonary neg pulmonary ROS,    breath sounds clear to auscultation       Cardiovascular hypertension,  Rhythm:regular Rate:Normal     Neuro/Psych  Headaches,    GI/Hepatic   Endo/Other    Renal/GU      Musculoskeletal   Abdominal   Peds  Hematology   Anesthesia Other Findings   Reproductive/Obstetrics (+) Pregnancy                             Anesthesia Physical Anesthesia Plan  ASA: II  Anesthesia Plan: Epidural   Post-op Pain Management:    Induction: Intravenous  Airway Management Planned: Natural Airway  Additional Equipment:   Intra-op Plan:   Post-operative Plan:   Informed Consent: I have reviewed the patients History and Physical, chart, labs and discussed the procedure including the risks, benefits and alternatives for the proposed anesthesia with the patient or authorized representative who has indicated his/her understanding and acceptance.     Plan Discussed with: CRNA, Anesthesiologist and Surgeon  Anesthesia Plan Comments:         Anesthesia Quick Evaluation

## 2017-04-14 NOTE — Progress Notes (Signed)
S: Patient seen & examined for progress of labor. Patient comfortable with nitrous.   O:  Vitals:   04/14/17 0700 04/14/17 0731 04/14/17 0801 04/14/17 0834  BP: (!) 129/53 (!) 132/46 129/76 137/71  Pulse: 80 83 92 89  Resp: 18 20 18 20   Temp:  97.9 F (36.6 C)    TempSrc:  Oral    Weight:      Height:        Dilation: 3.5 Effacement (%): 60, 70 Cervical Position: Posterior Station: -2 Presentation: Vertex Exam by:: Melburn PopperMichelle Williams, RN   FHT: 140 bpm, mod var, +accels, no decels TOCO: occasional   A/P:  Labor: Progressing normally. S/p cytotec and foley, now with pitocin Pain: Well controlled with nitrous, patient may have epidural on request Fetal well-being: Category I tracing   Continue expectant management Anticipate SVD

## 2017-04-14 NOTE — Progress Notes (Signed)
Vitals:   04/14/17 1931 04/14/17 2007  BP: (!) 100/59 (!) 104/53  Pulse: 89 85  Resp: 18 16  Temp: 98.2 F (36.8 C)     Had some late decels after dosing epidural, labor never really good and adequate.  Pitocin off for 2 hours, will restart at 3410mu/min FHR cat 1 now

## 2017-04-14 NOTE — Progress Notes (Signed)
Labor Progress Note  Patricia [name] is a 26 y.o. G3P1011 at 6450w0d  admitted for induction of labor due to Hypertension, chronic.  S: Pit started due to no change in cervical dilation. Patient still experiencing painful contractions.   O:  BP (!) 123/53   Pulse 100   Temp 98.2 F (36.8 C) (Oral)   Resp 18   Ht 5\' 5"  (1.651 m)   Wt 296 lb (134.3 kg)   LMP 07/22/2016 (Exact Date)   BMI 49.26 kg/m   No intake/output data recorded.  FHT:  FHR: 140 bpm, variability: minimal ,  accelerations:  Present,  decelerations:  Absent  Uterine activity: Contractions irregular SVE:   Dilation: 3.5 Effacement (%): 60, 70 Station: -2 Exam by:: Melburn PopperMichelle Williams, RN SROM/AROM: intact   Labs: Lab Results  Component Value Date   WBC 8.3 04/13/2017   HGB 11.4 (L) 04/13/2017   HCT 34.5 (L) 04/13/2017   MCV 80.4 04/13/2017   PLT 251 04/13/2017    Assessment / Plan: 26 y.o. G3P1011 4050w0d presenting for IOL for cHTN in early labor. Augmentation of labor, progressing well  Labor: Progressing on Pitocin, will continue to increase then AROM.  Fetal Wellbeing:  Category I Pain Control:  Nitrous Oxide Anticipated MOD:  NSVD  Expectant management   Tarri AbernethyAbigail J Shemika Robbs, MD, MPH PGY-2 [facility]

## 2017-04-14 NOTE — Progress Notes (Signed)
Labor Progress Note  Patricia GraveJudy C [name] is a 26 y.o. G3P1011 at 8869w0d  admitted for induction of labor due to Hypertension, chronic.  S: Sleeping comfortably  O:  BP (!) 141/92   Pulse 83   Temp 98 F (36.7 C) (Oral)   Resp 16   Ht 5\' 5"  (1.651 m)   Wt 296 lb (134.3 kg)   LMP 07/22/2016 (Exact Date)   BMI 49.26 kg/m   No intake/output data recorded.  FHT:  FHR: 130s bpm, variability: minimal ,  accelerations:  Present,  decelerations:  Absent  Uterine activity: Contractions irregular SVE:   Dilation: 3.5 Effacement (%): 60, 70 Station: -2 Exam by:: Melburn PopperMichelle Williams, RN SROM/AROM: intact  Pitocin at 16 mu/min  Labs: Lab Results  Component Value Date   WBC 8.3 04/13/2017   HGB 11.4 (L) 04/13/2017   HCT 34.5 (L) 04/13/2017   MCV 80.4 04/13/2017   PLT 251 04/13/2017    Assessment / Plan: 26 y.o. G3P1011 2869w0d presenting for IOL for cHTN in early labor. Augmentation of labor, progressing well  Labor: Progressing on Pitocin, will continue to increase then AROM.  Fetal Wellbeing:  Category I Pain Control:  Nitrous Oxide Anticipated MOD:  NSVD  Expectant management   Tarri AbernethyAbigail J Lucresia Simic, MD, MPH PGY-2 [facility]

## 2017-04-15 MED ORDER — OXYCODONE HCL 5 MG PO TABS: 10.0000 mg | ORAL_TABLET | ORAL | Status: DC | PRN

## 2017-04-15 MED ORDER — SIMETHICONE 80 MG PO CHEW: 80.0000 mg | CHEWABLE_TABLET | ORAL | Status: DC | PRN

## 2017-04-15 MED ORDER — DIPHENHYDRAMINE HCL 25 MG PO CAPS: 25.0000 mg | ORAL_CAPSULE | Freq: Four times a day (QID) | ORAL | Status: DC | PRN

## 2017-04-15 MED ORDER — FERROUS SULFATE 325 (65 FE) MG PO TABS
325.0000 mg | ORAL_TABLET | Freq: Two times a day (BID) | ORAL | Status: DC
  Administered 2017-04-15 – 2017-04-16 (×3): 325 mg via ORAL
  Filled 2017-04-15 (×3): qty 1

## 2017-04-15 MED ORDER — PRENATAL MULTIVITAMIN CH
1.0000 | ORAL_TABLET | Freq: Every day | ORAL | Status: DC
  Administered 2017-04-15: 1 via ORAL
  Filled 2017-04-15: qty 1

## 2017-04-15 MED ORDER — ACETAMINOPHEN 325 MG PO TABS: 650.0000 mg | ORAL_TABLET | ORAL | Status: DC | PRN

## 2017-04-15 MED ORDER — TETANUS-DIPHTH-ACELL PERTUSSIS 5-2.5-18.5 LF-MCG/0.5 IM SUSP: 0.5000 mL | Freq: Once | INTRAMUSCULAR | Status: DC

## 2017-04-15 MED ORDER — METHYLERGONOVINE MALEATE 0.2 MG PO TABS: 0.2000 mg | ORAL_TABLET | ORAL | Status: DC | PRN

## 2017-04-15 MED ORDER — WITCH HAZEL-GLYCERIN EX PADS: 1.0000 "application " | MEDICATED_PAD | CUTANEOUS | Status: DC | PRN

## 2017-04-15 MED ORDER — METHYLERGONOVINE MALEATE 0.2 MG/ML IJ SOLN: 0.2000 mg | INTRAMUSCULAR | Status: DC | PRN

## 2017-04-15 MED ORDER — MEASLES, MUMPS & RUBELLA VAC ~~LOC~~ INJ
0.5000 mL | INJECTION | Freq: Once | SUBCUTANEOUS | Status: DC
Start: 2017-04-15 — End: 2017-04-16
  Filled 2017-04-15: qty 0.5

## 2017-04-15 MED ORDER — OXYCODONE HCL 5 MG PO TABS
5.0000 mg | ORAL_TABLET | ORAL | Status: DC | PRN
  Administered 2017-04-15: 5 mg via ORAL

## 2017-04-15 MED ORDER — DOCUSATE SODIUM 100 MG PO CAPS
100.0000 mg | ORAL_CAPSULE | Freq: Two times a day (BID) | ORAL | Status: DC
  Administered 2017-04-15 (×2): 100 mg via ORAL
  Filled 2017-04-15 (×2): qty 1

## 2017-04-15 MED ORDER — BISACODYL 10 MG RE SUPP: 10.0000 mg | Freq: Every day | RECTAL | Status: DC | PRN

## 2017-04-15 MED ORDER — ONDANSETRON HCL 4 MG/2ML IJ SOLN: 4.0000 mg | INTRAMUSCULAR | Status: DC | PRN

## 2017-04-15 MED ORDER — FLEET ENEMA 7-19 GM/118ML RE ENEM: 1.0000 | ENEMA | Freq: Every day | RECTAL | Status: DC | PRN

## 2017-04-15 MED ORDER — BENZOCAINE-MENTHOL 20-0.5 % EX AERO
1.0000 "application " | INHALATION_SPRAY | CUTANEOUS | Status: DC | PRN
  Administered 2017-04-15: 1 via TOPICAL
  Filled 2017-04-15: qty 56

## 2017-04-15 MED ORDER — ZOLPIDEM TARTRATE 5 MG PO TABS: 5.0000 mg | ORAL_TABLET | Freq: Every evening | ORAL | Status: DC | PRN

## 2017-04-15 MED ORDER — DIBUCAINE 1 % RE OINT: 1.0000 "application " | TOPICAL_OINTMENT | RECTAL | Status: DC | PRN

## 2017-04-15 MED ORDER — ONDANSETRON HCL 4 MG PO TABS: 4.0000 mg | ORAL_TABLET | ORAL | Status: DC | PRN

## 2017-04-15 MED ORDER — COCONUT OIL OIL: 1.0000 "application " | TOPICAL_OIL | Status: DC | PRN

## 2017-04-15 NOTE — Progress Notes (Signed)
Post Partum Day #1 Subjective: no complaints, up ad lib, voiding, tolerating PO and reports normal lochia  Objective: Blood pressure (!) 141/67, pulse (!) 101, temperature 97.4 F (36.3 C), temperature source Oral, resp. rate 18, height 5\' 5"  (1.651 m), weight 134.3 kg (296 lb), last menstrual period 07/22/2016, SpO2 96 %, unknown if currently breastfeeding.  Physical Exam:  General: alert Lochia: appropriate Uterine Fundus: firm DVT Evaluation: No evidence of DVT seen on physical exam.   Recent Labs  04/14/17 1420 04/14/17 2318  HGB 11.3* 11.5*  HCT 33.7* 34.3*    Assessment/Plan: Plan for discharge tomorrow   LOS: 2 days   Allie BossierMyra C Durenda Pechacek 04/15/2017, 7:38 AM

## 2017-04-15 NOTE — Progress Notes (Signed)
CSW attempted to meet with MOB to offer support due to possible chromosomal abnormality and note that FOB is in jail.  MOB had a visitor, was eating lunch and was on the phone when CSW arrived.  She was pleasant and asked that CSW return at a later time.  CSW will attempt again if possible, however, notes no concerns in MOB's chart that would prevent her from discharging prior to CSW assessment. 

## 2017-04-15 NOTE — Anesthesia Postprocedure Evaluation (Signed)
Anesthesia Post Note  Patient: Patricia [name]  Procedure(s) Performed: * No procedures listed *  Patient location during evaluation: Mother Baby Anesthesia Type: Epidural Level of consciousness: awake and alert and oriented Pain management: satisfactory to patient Vital Signs Assessment: post-procedure vital signs reviewed and stable Respiratory status: spontaneous breathing and nonlabored ventilation Cardiovascular status: stable Postop Assessment: no headache, no backache, no signs of nausea or vomiting, adequate PO intake and patient able to bend at knees (patient up walking) Anesthetic complications: no        Last Vitals:  Vitals:   04/15/17 0107 04/15/17 0524  BP: (!) 134/57 (!) 141/67  Pulse: (!) 103 (!) 101  Resp:  18  Temp:  36.3 C    Last Pain:  Vitals:   04/15/17 0524  TempSrc: Oral  PainSc: 0-No pain   Pain Goal:                 Madison HickmanGREGORY,[name]

## 2017-04-16 MED ORDER — IBUPROFEN 600 MG PO TABS: 600.0000 mg | ORAL_TABLET | Freq: Four times a day (QID) | ORAL | 0 refills | Status: DC | PRN

## 2017-04-16 MED ORDER — AMLODIPINE BESYLATE 5 MG PO TABS: 5.0000 mg | ORAL_TABLET | Freq: Every day | ORAL | 3 refills | Status: DC

## 2017-04-16 NOTE — Plan of Care (Signed)
Problem: Health Behavior/Discharge Planning: Goal: Ability to manage health-related needs will improve Anticipate discharge today

## 2017-04-16 NOTE — Lactation Note (Signed)
This note was copied from a baby's chart. Lactation Consultation Note Experienced BF mom BF her 1st child for 4 months exclusively then breast/formula for 2 months.  Mom has large pendulum breast w/everted elongated nipples at the bottom end of breast turning inwards towards abd.. Mom stated baby has been BF well, excepts prefers the Right breast. Has a hard time latching to Lt. Breast. Mom stated she had been BF in football position. Assisted to Lt. Breast. Baby fussed at breast. Assessed baby, positioning to see if baby was uncomfortable. Used props under moms hand, pillows close to breast w/good body alignment, mom sitting up straight and comfortable. Baby finally latched. Would hold in mouth after suckling a short time, just looking around. Then started suckling well w/breast massage.  Hand expressed easy flow of colostrum. Had difficulty getting baby to open wide for latching. Encouraged mom to not let baby on w/small flange prying nipple into mouth. Encouraged to stimulate top lip. Reviewed newborn feeding habits, cluster feeding, I&O, supply and demand, feeding q 2-3 hrs. WH/LC brochure given w/resources, support groups and LC services. Encouraged to call for questions or concerns. Mom has WIC.  Patient Name: Patricia [name] ZOXWR'UToday's Date: 04/16/2017 Reason for consult: Initial assessment   Maternal Data Has patient been taught Hand Expression?: Yes Does the patient have breastfeeding experience prior to this delivery?: Yes  Feeding Feeding Type: Breast Fed Length of feed: 20 min  LATCH Score/Interventions Latch: Repeated attempts needed to sustain latch, nipple held in mouth throughout feeding, stimulation needed to elicit sucking reflex. Intervention(s): Adjust position;Assist with latch;Breast massage;Breast compression  Audible Swallowing: A few with stimulation Intervention(s): Hand expression;Alternate breast massage  Type of Nipple: Everted at rest and after  stimulation  Comfort (Breast/Nipple): Soft / non-tender     Hold (Positioning): Assistance needed to correctly position infant at breast and maintain latch. Intervention(s): Breastfeeding basics reviewed;Support Pillows;Position options;Skin to skin  LATCH Score: 7  Lactation Tools Discussed/Used Tools: Pump Breast pump type: Manual WIC Program: Yes   Consult Status Consult Status: Follow-up Date: 04/16/17 Follow-up type: In-patient    Charyl DancerCARVER, Christien Berthelot G 04/16/2017, 3:41 AM

## 2017-04-16 NOTE — Lactation Note (Signed)
This note was copied from a baby's chart. Lactation Consultation Note  Baby 4336 hours old.  Mother has hx of PCOS and hoping to be discharged today. Upon entering mother attempting to latch baby.  He was mouthing nipple but did not sustain latch. Baby recently bf for 24 min. Reviewed hand expression.  Drops expressed.  Encouraged hand expression before feedings and often. Noted lingual frenulum tightness limited infant's ability to protrude tongue.  Suggest discussing it with Pediatrician. Discussed undressing baby for feedings and compressing breast during feeding to keep baby active. Mother states feedings have improved. Suggest she hold baby STS since he recently fed and does not seem interested now. Mother has manual pump and plans on getting DEBP. Suggest she post pump a few times a day and give baby back volume pumped and to help stimulate milk supply. Pacifier use not recommended at this time.  Mom encouraged to feed baby 8-12 times/24 hours and with feeding cues.  Mom made aware of O/P services, breastfeeding support groups, community resources, and our phone # for post-discharge questions.    Patient Name: Boy Luana ShuJudy Broadnax NWGNF'AToday's Date: 04/16/2017 Reason for consult: Follow-up assessment   Maternal Data    Feeding Feeding Type: Breast Fed Length of feed: 23 min  LATCH Score/Interventions                      Lactation Tools Discussed/Used     Consult Status Consult Status: Complete    Hardie PulleyBerkelhammer, Rigdon Macomber Boschen 04/16/2017, 10:48 AM

## 2017-04-16 NOTE — Discharge Summary (Signed)
OB Discharge Summary     Patient Name: Patricia [name] DOB: 1990-12-16 MRN: [id number]  Date of admission: 04/13/2017 Delivering MD: Marquette SaaLANCASTER, ABIGAIL JOSEPH   Date of discharge: 04/16/2017  Admitting diagnosis: INDUCTION Intrauterine pregnancy: [redacted]w[redacted]d     Secondary diagnosis:  Active Problems:   Chronic hypertension complicating or reason for care during childbirth   Chronic hypertension in pregnancy  Additional problems: none     Discharge diagnosis: Term Pregnancy Delivered and CHTN                                                                                                Post partum procedures:none  Augmentation: AROM, Pitocin and Cytotec  Complications: None  Hospital course:  Induction of Labor With Vaginal Delivery   26 y.o. yo [id number] at 749w1d was admitted to the hospital 04/13/2017 for induction of labor.  Indication for induction: CHTN.  Patient had an uncomplicated labor course as follows: Membrane Rupture Time/Date: 10:32 AM ,04/14/2017   Intrapartum Procedures: Episiotomy: None [1]                                         Lacerations:  None [1]  Patient had delivery of a Viable infant.  Information for the patient's newborn:  Patricia [name], Boy Patricia [name] [[id number]][[id number]]  Delivery Method: Vaginal, Spontaneous Delivery (Filed from Delivery Summary)   04/14/2017  Details of delivery can be found in separate delivery note.  Patient had a routine postpartum course. Patient is discharged home 04/16/17.  Physical exam  Vitals:   04/15/17 0524 04/15/17 1853 04/15/17 2353 04/16/17 0645  BP: (!) 141/67 (!) 145/76 (!) 146/83 131/69  Pulse: (!) 101 98  80  Resp: 18 18  18   Temp: 97.4 F (36.3 C) 98.4 F (36.9 C)  98 F (36.7 C)  TempSrc: Oral Oral  Oral  SpO2:      Weight:      Height:       General: alert, cooperative and no distress Lochia: appropriate Uterine Fundus: firm Incision: N/A DVT Evaluation: No evidence of DVT seen on physical exam. Negative Homan's  sign. No cords or calf tenderness. No significant calf/ankle edema. Labs: Lab Results  Component Value Date   WBC 13.2 (H) 04/14/2017   HGB 11.5 (L) 04/14/2017   HCT 34.3 (L) 04/14/2017   MCV 80.9 04/14/2017   PLT 219 04/14/2017   CMP Latest Ref Rng & Units 04/13/2017  Glucose 65 - 99 mg/dL 130(Q133(H)  BUN 6 - 20 mg/dL 7  Creatinine 6.570.44 - 8.461.00 mg/dL 9.620.55  Sodium 952135 - 841145 mmol/L 135  Potassium 3.5 - 5.1 mmol/L 4.1  Chloride 101 - 111 mmol/L 106  CO2 22 - 32 mmol/L 21(L)  Calcium 8.9 - 10.3 mg/dL 9.3  Total Protein 6.5 - 8.1 g/dL 3.2(G5.8(L)  Total Bilirubin 0.3 - 1.2 mg/dL 0.3  Alkaline Phos 38 - 126 U/L 93  AST 15 - 41 U/L 23  ALT 14 - 54 U/L 16    Discharge instruction:  per After Visit Summary and "Baby and Me Booklet".  After visit meds:  Allergies as of 04/16/2017      Reactions   Peanut-containing Drug Products Anaphylaxis, Hives   Propranolol Shortness Of Breath   Cephalexin Hives   Imitrex [sumatriptan] Hives      Medication List    STOP taking these medications   aspirin EC 81 MG tablet   DICLEGIS 10-10 MG Tbec Generic drug:  Doxylamine-Pyridoxine   labetalol 200 MG tablet Commonly known as:  NORMODYNE     TAKE these medications   amLODipine 5 MG tablet Commonly known as:  NORVASC Take 1 tablet (5 mg total) by mouth daily.   COMPLETE NATAL DHA 29-1-200 & 250 MG Misc Take 1 tablet by mouth every evening.   ibuprofen 600 MG tablet Commonly known as:  ADVIL,MOTRIN Take 1 tablet (600 mg total) by mouth every 6 (six) hours as needed for mild pain, moderate pain or cramping.   omeprazole 10 MG capsule Commonly known as:  PRILOSEC Take 1 capsule (10 mg total) by mouth daily.   PROAIR HFA 108 (90 Base) MCG/ACT inhaler Generic drug:  albuterol Inhale 2 puffs into the lungs every 4 (four) hours as needed for wheezing or shortness of breath.       Diet: routine diet  Activity: Advance as tolerated. Pelvic rest for 6 weeks.   Outpatient follow up:5/31  as scheduled for bp check Follow up Appt:Future Appointments Date Time Provider Department Center  04/21/2017 12:00 PM Cresenzo-Dishmon, Scarlette Calico, CNM [facility]   Follow up Visit:No Follow-up on file.  Postpartum contraception: abstinence until nexplanon  Newborn Data: Live born female  Birth Weight: 7 lb 7 oz (3374 g) APGAR: 9, 9  Baby Feeding: Breast Disposition:home with mother  CHTN, no meds prior to pregnancy, labetalol 200mg  BID during pregnancy, states she hasn't been receiving bp meds pp and her bp was high last night, so she took one of her own labetalol from home. To stop labetalol, rx norvasc 5mg , stop 2d prior to pp visit.    04/16/2017 [name], CNM

## 2017-04-16 NOTE — Discharge Summary (Signed)
Obstetric Discharge Summary Reason for Admission: induction of labor Prenatal Procedures: none Intrapartum Procedures: spontaneous vaginal delivery Postpartum Procedures: none Complications-Operative and Postpartum: none  Delivery Note At 10:21 PM a healthy female was delivered via Vaginal, Spontaneous Delivery (Presentation: occiput; anterior).  APGAR: 9, 9; weight pending.   Placenta status: intact.  Cord: 3V with the following complications: none.  Cord pH: N/A  Anesthesia:  Epidural Episiotomy: None Lacerations: None Est. Blood Loss (mL): 234  Mom to postpartum.  Baby to Couplet care / Skin to Skin.  Upon arrival patient was complete and pushing. She pushed with good maternal effort to deliver a healthy baby boy. Baby delivered without difficulty, was noted to have good tone and place on maternal abdomen for oral suctioning, drying and stimulation. Delayed cord clamping performed. Placenta delivered intact with 3V cord. Vaginal canal and perineum was inspected and no lacerations noted; hemostatic. Pitocin was started and uterus massaged until bleeding slowed. Counts of sharps, instruments, and lap pads were all correct.   Hospital Course:  Active Problems:   Chronic hypertension complicating or reason for care during childbirth   Chronic hypertension in pregnancy   Patricia [name] is a 26 y.o. [id number] s/p SVD.  Patient was admitted for IOL due to chronic HTN.  She has postpartum course that was uncomplicated including no problems with ambulating, PO intake, urination, pain, or bleeding. The pt feels ready to go home and  will be discharged with outpatient follow-up.   Today: No acute events overnight.  Pt denies problems with ambulating, voiding or po intake.  She denies nausea or vomiting.  Pain is well controlled.  She has had flatus. She has had bowel movement.  Lochia Moderate.  Plan for birth control is  Nexplanon.  Method of Feeding: Breast  Physical Exam:  General:  alert, cooperative and no distress Lochia: appropriate Uterine Fundus: firm DVT Evaluation: No evidence of DVT seen on physical exam.  H/H: Lab Results  Component Value Date/Time   HGB 11.5 (L) 04/14/2017 11:18 PM   HCT 34.3 (L) 04/14/2017 11:18 PM   HCT 33.3 (L) 01/25/2017 08:59 AM    Discharge Diagnoses: Term Pregnancy-delivered  Discharge Information: Date: 04/16/2017 Activity: pelvic rest Diet: routine  Medications: PNV and Ibuprofen Breast feeding:  Yes Condition: stable Instructions: refer to handout Discharge to: home   Discharge Instructions    Call MD for:  difficulty breathing, headache or visual disturbances    Complete by:  As directed    Call MD for:  extreme fatigue    Complete by:  As directed    Call MD for:  hives    Complete by:  As directed    Call MD for:  persistant dizziness or light-headedness    Complete by:  As directed    Call MD for:  persistant nausea and vomiting    Complete by:  As directed    Call MD for:  redness, tenderness, or signs of infection (pain, swelling, redness, odor or green/yellow discharge around incision site)    Complete by:  As directed    Call MD for:  severe uncontrolled pain    Complete by:  As directed    Call MD for:  temperature >100.4    Complete by:  As directed    Driving restriction     Complete by:  As directed    Avoid driving for at least 2 weeks.   Lifting restrictions    Complete by:  As directed  Weight restriction of 10 lbs.   Sexual acrtivity    Complete by:  As directed    Pelvic rest (no sex or tampons) for at least six weeks     Allergies as of 04/16/2017      Reactions   Peanut-containing Drug Products Anaphylaxis, Hives   Propranolol Shortness Of Breath   Cephalexin Hives   Imitrex [sumatriptan] Hives      Medication List    STOP taking these medications   aspirin EC 81 MG tablet   DICLEGIS 10-10 MG Tbec Generic drug:  Doxylamine-Pyridoxine     TAKE these medications    COMPLETE NATAL DHA 29-1-200 & 250 MG Misc Take 1 tablet by mouth every evening.   labetalol 200 MG tablet Commonly known as:  NORMODYNE Take 1 tablet (200 mg total) by mouth 2 (two) times daily.   omeprazole 10 MG capsule Commonly known as:  PRILOSEC Take 1 capsule (10 mg total) by mouth daily.   PROAIR HFA 108 (90 Base) MCG/ACT inhaler Generic drug:  albuterol Inhale 2 puffs into the lungs every 4 (four) hours as needed for wheezing or shortness of breath.        Patricia AbernethyAbigail J Richerd [name] ,MD 04/16/2017,7:26 AM

## 2017-04-16 NOTE — Discharge Instructions (Signed)
NO SEX UNTIL AFTER YOU GET YOUR BIRTH CONTROL  ° ° °Postpartum Care After Vaginal Delivery °The period of time right after you deliver your newborn is called the postpartum period. °What kind of medical care will I receive? °· You may continue to receive fluids and medicines through an IV tube inserted into one of your veins. °· If an incision was made near your vagina (episiotomy) or if you had some vaginal tearing during delivery, cold compresses may be placed on your episiotomy or your tear. This helps to reduce pain and swelling. °· You may be given a squirt bottle to use when you go to the bathroom. You may use this until you are comfortable wiping as usual. To use the squirt bottle, follow these steps: °¨ Before you urinate, fill the squirt bottle with warm water. Do not use hot water. °¨ After you urinate, while you are sitting on the toilet, use the squirt bottle to rinse the area around your urethra and vaginal opening. This rinses away any urine and blood. °¨ You may do this instead of wiping. As you start healing, you may use the squirt bottle before wiping yourself. Make sure to wipe gently. °¨ Fill the squirt bottle with clean water every time you use the bathroom. °· You will be given sanitary pads to wear. °How can I expect to feel? °· You may not feel the need to urinate for several hours after delivery. °· You will have some soreness and pain in your abdomen and vagina. °· If you are breastfeeding, you may have uterine contractions every time you breastfeed for up to several weeks postpartum. Uterine contractions help your uterus return to its normal size. °· It is normal to have vaginal bleeding (lochia) after delivery. The amount and appearance of lochia is often similar to a menstrual period in the first week after delivery. It will gradually decrease over the next few weeks to a dry, yellow-brown discharge. For most women, lochia stops completely by 6-8 weeks after delivery. Vaginal bleeding can  vary from woman to woman. °· Within the first few days after delivery, you may have breast engorgement. This is when your breasts feel heavy, full, and uncomfortable. Your breasts may also throb and feel hard, tightly stretched, warm, and tender. After this occurs, you may have milk leaking from your breasts. Your health care provider can help you relieve discomfort due to breast engorgement. Breast engorgement should go away within a few days. °· You may feel more sad or worried than normal due to hormonal changes after delivery. These feelings should not last more than a few days. If these feelings do not go away after several days, speak with your health care provider. °How should I care for myself? °· Tell your health care provider if you have pain or discomfort. °· Drink enough water to keep your urine clear or pale yellow. °· Wash your hands thoroughly with soap and water for at least 20 seconds after changing your sanitary pads, after using the toilet, and before holding or feeding your baby. °· If you are not breastfeeding, avoid touching your breasts a lot. Doing this can make your breasts produce more milk. °· If you become weak or lightheaded, or you feel like you might faint, ask for help before: °¨ Getting out of bed. °¨ Showering. °· Change your sanitary pads frequently. Watch for any changes in your flow, such as a sudden increase in volume, a change in color, the passing of large blood   clots. If you pass a blood clot from your vagina, save it to show to your health care provider. Do not flush blood clots down the toilet without having your health care provider look at them. °· Make sure that all your vaccinations are up to date. This can help protect you and your baby from getting certain diseases. You may need to have immunizations done before you leave the hospital. °· If desired, talk with your health care provider about methods of family planning or birth control (contraception). °How can I start  bonding with my baby? °Spending as much time as possible with your baby is very important. During this time, you and your baby can get to know each other and develop a bond. Having your baby stay with you in your room (rooming in) can give you time to get to know your baby. Rooming in can also help you become comfortable caring for your baby. Breastfeeding can also help you bond with your baby. °How can I plan for returning home with my baby? °· Make sure that you have a car seat installed in your vehicle. °¨ Your car seat should be checked by a certified car seat installer to make sure that it is installed safely. °¨ Make sure that your baby fits into the car seat safely. °· Ask your health care provider any questions you have about caring for yourself or your baby. Make sure that you are able to contact your health care provider with any questions after leaving the hospital. °This information is not intended to replace advice given to you by your health care provider. Make sure you discuss any questions you have with your health care provider. °Document Released: 09/05/2007 Document Revised: 04/12/2016 Document Reviewed: 10/13/2015 °Elsevier Interactive Patient Education © 2017 [employer] ° ° °Breastfeeding °Deciding to breastfeed is one of the best choices you can make for you and your baby. A change in hormones during pregnancy causes your breast tissue to grow and increases the number and size of your milk ducts. These hormones also allow proteins, sugars, and fats from your blood supply to make breast milk in your milk-producing glands. Hormones prevent breast milk from being released before your baby is born as well as prompt milk flow after birth. Once breastfeeding has begun, thoughts of your baby, as well as his or her sucking or crying, can stimulate the release of milk from your milk-producing glands. °Benefits of breastfeeding °For Your Baby °· Your first milk (colostrum) helps your baby's digestive  system function better. °· There are antibodies in your milk that help your baby fight off infections. °· Your baby has a lower incidence of asthma, allergies, and sudden infant death syndrome. °· The nutrients in breast milk are better for your baby than infant formulas and are designed uniquely for your baby’s needs. °· Breast milk improves your baby's brain development. °· Your baby is less likely to develop other conditions, such as childhood obesity, asthma, or type 2 diabetes mellitus. °For You °· Breastfeeding helps to create a very special bond between you and your baby. °· Breastfeeding is convenient. Breast milk is always available at the correct temperature and costs nothing. °· Breastfeeding helps to burn calories and helps you lose the weight gained during pregnancy. °· Breastfeeding makes your uterus contract to its prepregnancy size faster and slows bleeding (lochia) after you give birth. °· Breastfeeding helps to lower your risk of developing type 2 diabetes mellitus, osteoporosis, and breast or ovarian cancer later   in life. °Signs that your baby is hungry °Early Signs of Hunger °· Increased alertness or activity. °· Stretching. °· Movement of the head from side to side. °· Movement of the head and opening of the mouth when the corner of the mouth or cheek is stroked (rooting). °· Increased sucking sounds, smacking lips, cooing, sighing, or squeaking. °· Hand-to-mouth movements. °· Increased sucking of fingers or hands. °Late Signs of Hunger °· Fussing. °· Intermittent crying. °Extreme Signs of Hunger  °Signs of extreme hunger will require calming and consoling before your baby will be able to breastfeed successfully. Do not wait for the following signs of extreme hunger to occur before you initiate breastfeeding: °· Restlessness. °· A loud, strong cry. °· Screaming. °Breastfeeding basics ° Breastfeeding Initiation °· Find a comfortable place to sit or lie down, with your neck and back well  supported. °· Place a pillow or rolled up blanket under your baby to bring him or her to the level of your breast (if you are seated). Nursing pillows are specially designed to help support your arms and your baby while you breastfeed. °· Make sure that your baby's abdomen is facing your abdomen. °· Gently massage your breast. With your fingertips, massage from your chest wall toward your nipple in a circular motion. This encourages milk flow. You may need to continue this action during the feeding if your milk flows slowly. °· Support your breast with 4 fingers underneath and your thumb above your nipple. Make sure your fingers are well away from your nipple and your baby’s mouth. °· Stroke your baby's lips gently with your finger or nipple. °· When your baby's mouth is open wide enough, quickly bring your baby to your breast, placing your entire nipple and as much of the colored area around your nipple (areola) as possible into your baby's mouth. °¨ More areola should be visible above your baby's upper lip than below the lower lip. °¨ Your baby's tongue should be between his or her lower gum and your breast. °· Ensure that your baby's mouth is correctly positioned around your nipple (latched). Your baby's lips should create a seal on your breast and be turned out (everted). °· It is common for your baby to suck about 2-3 minutes in order to start the flow of breast milk. °Latching  °Teaching your baby how to latch on to your breast properly is very important. An improper latch can cause nipple pain and decreased milk supply for you and poor weight gain in your baby. Also, if your baby is not latched onto your nipple properly, he or she may swallow some air during feeding. This can make your baby fussy. Burping your baby when you switch breasts during the feeding can help to get rid of the air. However, teaching your baby to latch on properly is still the best way to prevent fussiness from swallowing air while  breastfeeding. °Signs that your baby has successfully latched on to your nipple: °· Silent tugging or silent sucking, without causing you pain. °· Swallowing heard between every 3-4 sucks. °· Muscle movement above and in front of his or her ears while sucking. °Signs that your baby has not successfully latched on to nipple: °· Sucking sounds or smacking sounds from your baby while breastfeeding. °· Nipple pain. °If you think your baby has not latched on correctly, slip your finger into the corner of your baby’s mouth to break the suction and place it between your baby's gums. Attempt breastfeeding initiation   again. °Signs of Successful Breastfeeding  °Signs from your baby: °· A gradual decrease in the number of sucks or complete cessation of sucking. °· Falling asleep. °· Relaxation of his or her body. °· Retention of a small amount of milk in his or her mouth. °· Letting go of your breast by himself or herself. °Signs from you: °· Breasts that have increased in firmness, weight, and size 1-3 hours after feeding. °· Breasts that are softer immediately after breastfeeding. °· Increased milk volume, as well as a change in milk consistency and color by the fifth day of breastfeeding. °· Nipples that are not sore, cracked, or bleeding. °Signs That Your Baby is Getting Enough Milk °· Wetting at least 1-2 diapers during the first 24 hours after birth. °· Wetting at least 5-6 diapers every 24 hours for the first week after birth. The urine should be clear or pale yellow by 5 days after birth. °· Wetting 6-8 diapers every 24 hours as your baby continues to grow and develop. °· At least 3 stools in a 24-hour period by age 5 days. The stool should be soft and yellow. °· At least 3 stools in a 24-hour period by age 7 days. The stool should be seedy and yellow. °· No loss of weight greater than 10% of birth weight during the first 3 days of age. °· Average weight gain of 4-7 ounces (113-198 g) per week after age 4  days. °· Consistent daily weight gain by age 5 days, without weight loss after the age of 2 weeks. °After a feeding, your baby may spit up a small amount. This is common. °Breastfeeding frequency and duration °Frequent feeding will help you make more milk and can prevent sore nipples and breast engorgement. Breastfeed when you feel the need to reduce the fullness of your breasts or when your baby shows signs of hunger. This is called "breastfeeding on demand." Avoid introducing a pacifier to your baby while you are working to establish breastfeeding (the first 4-6 weeks after your baby is born). After this time you may choose to use a pacifier. Research has shown that pacifier use during the first year of a baby's life decreases the risk of sudden infant death syndrome (SIDS). °Allow your baby to feed on each breast as long as he or she wants. Breastfeed until your baby is finished feeding. When your baby unlatches or falls asleep while feeding from the first breast, offer the second breast. Because newborns are often sleepy in the first few weeks of life, you may need to awaken your baby to get him or her to feed. °Breastfeeding times will vary from baby to baby. However, the following rules can serve as a guide to help you ensure that your baby is properly fed: °· Newborns (babies 4 weeks of age or younger) may breastfeed every 1-3 hours. °· Newborns should not go longer than 3 hours during the day or 5 hours during the night without breastfeeding. °· You should breastfeed your baby a minimum of 8 times in a 24-hour period until you begin to introduce solid foods to your baby at around 6 months of age. °Breast milk pumping °Pumping and storing breast milk allows you to ensure that your baby is exclusively fed your breast milk, even at times when you are unable to breastfeed. This is especially important if you are going back to work while you are still breastfeeding or when you are not able to be present during  feedings. Your   lactation consultant can give you guidelines on how long it is safe to store breast milk. °A breast pump is a machine that allows you to pump milk from your breast into a sterile bottle. The pumped breast milk can then be stored in a refrigerator or freezer. Some breast pumps are operated by hand, while others use electricity. Ask your lactation consultant which type will work best for you. Breast pumps can be purchased, but some hospitals and breastfeeding support groups lease breast pumps on a monthly basis. A lactation consultant can teach you how to hand express breast milk, if you prefer not to use a pump. °Caring for your breasts while you breastfeed °Nipples can become dry, cracked, and sore while breastfeeding. The following recommendations can help keep your breasts moisturized and healthy: °· Avoid using soap on your nipples. °· Wear a supportive bra. Although not required, special nursing bras and tank tops are designed to allow access to your breasts for breastfeeding without taking off your entire bra or top. Avoid wearing underwire-style bras or extremely tight bras. °· Air dry your nipples for 3-4 minutes after each feeding. °· Use only cotton bra pads to absorb leaked breast milk. Leaking of breast milk between feedings is normal. °· Use lanolin on your nipples after breastfeeding. Lanolin helps to maintain your skin's normal moisture barrier. If you use pure lanolin, you do not need to wash it off before feeding your baby again. Pure lanolin is not toxic to your baby. You may also hand express a few drops of breast milk and gently massage that milk into your nipples and allow the milk to air dry. °In the first few weeks after giving birth, some women experience extremely full breasts (engorgement). Engorgement can make your breasts feel heavy, warm, and tender to the touch. Engorgement peaks within 3-5 days after you give birth. The following recommendations can help ease  engorgement: °· Completely empty your breasts while breastfeeding or pumping. You may want to start by applying warm, moist heat (in the shower or with warm water-soaked hand towels) just before feeding or pumping. This increases circulation and helps the milk flow. If your baby does not completely empty your breasts while breastfeeding, pump any extra milk after he or she is finished. °· Wear a snug bra (nursing or regular) or tank top for 1-2 days to signal your body to slightly decrease milk production. °· Apply ice packs to your breasts, unless this is too uncomfortable for you. °· Make sure that your baby is latched on and positioned properly while breastfeeding. °If engorgement persists after 48 hours of following these recommendations, contact your health care provider or a lactation consultant. °Overall health care recommendations while breastfeeding °· Eat healthy foods. Alternate between meals and snacks, eating 3 of each per day. Because what you eat affects your breast milk, some of the foods may make your baby more irritable than usual. Avoid eating these foods if you are sure that they are negatively affecting your baby. °· Drink milk, fruit juice, and water to satisfy your thirst (about 10 glasses a day). °· Rest often, relax, and continue to take your prenatal vitamins to prevent fatigue, stress, and anemia. °· Continue breast self-awareness checks. °· Avoid chewing and smoking tobacco. Chemicals from cigarettes that pass into breast milk and exposure to secondhand smoke may harm your baby. °· Avoid alcohol and drug use, including marijuana. °Some medicines that may be harmful to your baby can pass through breast milk. It is important   to ask your health care provider before taking any medicine, including all over-the-counter and prescription medicine as well as vitamin and herbal supplements. °It is possible to become pregnant while breastfeeding. If birth control is desired, ask your health care  provider about options that will be safe for your baby. °Contact a health care provider if: °· You feel like you want to stop breastfeeding or have become frustrated with breastfeeding. °· You have painful breasts or nipples. °· Your nipples are cracked or bleeding. °· Your breasts are red, tender, or warm. °· You have a swollen area on either breast. °· You have a fever or chills. °· You have nausea or vomiting. °· You have drainage other than breast milk from your nipples. °· Your breasts do not become full before feedings by the fifth day after you give birth. °· You feel sad and depressed. °· Your baby is too sleepy to eat well. °· Your baby is having trouble sleeping. °· Your baby is wetting less than 3 diapers in a 24-hour period. °· Your baby has less than 3 stools in a 24-hour period. °· Your baby's skin or the white part of his or her eyes becomes yellow. °· Your baby is not gaining weight by 5 days of age. °Get help right away if: °· Your baby is overly tired (lethargic) and does not want to wake up and feed. °· Your baby develops an unexplained fever. °This information is not intended to replace advice given to you by your health care provider. Make sure you discuss any questions you have with your health care provider. °Document Released: 11/08/2005 Document Revised: 04/21/2016 Document Reviewed: 05/02/2013 °Elsevier Interactive Patient Education © 2017 [employer] ° ° °

## 2017-04-21 ENCOUNTER — Encounter: Payer: Self-pay | Admitting: Advanced Practice Midwife

## 2017-04-21 ENCOUNTER — Ambulatory Visit (INDEPENDENT_AMBULATORY_CARE_PROVIDER_SITE_OTHER): Payer: 59 | Admitting: Advanced Practice Midwife

## 2017-04-21 VITALS — BP 142/86 | HR 72 | Wt 277.0 lb

## 2017-04-21 DIAGNOSIS — O135 Gestational [pregnancy-induced] hypertension without significant proteinuria, complicating the puerperium: Secondary | ICD-10-CM | POA: Diagnosis not present

## 2017-04-21 DIAGNOSIS — O10919 Unspecified pre-existing hypertension complicating pregnancy, unspecified trimester: Secondary | ICD-10-CM

## 2017-04-21 NOTE — Progress Notes (Signed)
[facility] Visit  Patient name: Patricia [name] MRN [id number]  Date of birth: [birth year]  CC & HPI:  Patricia [name] is a 26 y.o. African American female presenting today for BP check. She had HTN dx at 12 weeks pegnant; pt says she had normal bp in between her pregnancies. was delivered 1 weeks ago.  Sent home on Norvasc 5mg . Denies problems  Pertinent History Reviewed:  Medical & Surgical Hx:   Past Medical History:  Diagnosis Date  . Asthma   . Cough 08/01/2015  . Migraine   . Migraines   . Nausea and vomiting during pregnancy prior to [redacted] weeks gestation 08/01/2015  . PCO (polycystic ovaries) 03/08/2014  . Polycystic disease, ovaries   . Pregnancy induced hypertension   . Pregnant 05/27/2015  . URI (upper respiratory infection) 08/01/2015   Past Surgical History:  Procedure Laterality Date  . TONSILLECTOMY    . WISDOM TOOTH EXTRACTION     Family History  Problem Relation Age of Onset  . Migraines Sister   . Hypertension Brother   . Heart disease Maternal Grandmother   . Cancer Maternal Grandfather        prostate  . Stroke Paternal Grandmother   . Cancer Paternal Grandfather        prostate  . Stroke Paternal Grandfather   . Atrial fibrillation Father     Current Outpatient Prescriptions:  .  amLODipine (NORVASC) 5 MG tablet, Take 1 tablet (5 mg total) by mouth daily., Disp: 30 tablet, Rfl: 3 .  ibuprofen (ADVIL,MOTRIN) 600 MG tablet, Take 1 tablet (600 mg total) by mouth every 6 (six) hours as needed for mild pain, moderate pain or cramping., Disp: 30 tablet, Rfl: 0 .  Prenat-FeBis-FePro-FA-CA-Omega (COMPLETE NATAL DHA) 29-1-200 & 250 MG MISC, Take 1 tablet by mouth every evening., Disp: , Rfl:  .  PROAIR HFA 108 (90 Base) MCG/ACT inhaler, Inhale 2 puffs into the lungs every 4 (four) hours as needed for wheezing or shortness of breath., Disp: 1 Inhaler, Rfl: 4 .  omeprazole (PRILOSEC) 10 MG capsule, Take 1 capsule (10 mg total) by mouth daily. (Patient not  taking: Reported on 04/21/2017), Disp: 30 capsule, Rfl: 6 Social History: Reviewed -  reports that she has never smoked. She has never used smokeless tobacco.  Review of Systems:   Constitutional: Negative for fever and chills Eyes: Negative for visual disturbances Respiratory: Negative for shortness of breath, dyspnea Cardiovascular: Negative for chest pain or palpitations  Gastrointestinal: Negative for vomiting, diarrhea and constipation; no abdominal pain Genitourinary: Negative for dysuria and urgency, vaginal irritation or itching Musculoskeletal: Negative for back pain, joint pain, myalgias  Neurological: Negative for dizziness and headaches    Objective Findings:    Physical Examination: General appearance - well appearing, and in no distress Mental status - alert, oriented to person, place, and time Chest:  Normal respiratory effort Heart - normal rate and regular rhythm Abdomen:  Soft, nontender Pelvic: deferred Musculoskeletal:  Normal range of motion without pain Extremities:  No edema   Vitals:   04/21/17 1214 04/21/17 1252  BP: 140/80 (!) 142/86  Pulse: 72     No results found for this or any previous visit (from the past 24 hour(s)).   146/92  Assessment & Plan:  A:   GHTN P:  Increase Norvasc to 10mg    Return for 3 weeks for postpartum and 4 weeks for nexplanon (please order).  CRESENZO-DISHMAN,Delorice Bannister CNM 04/21/2017 3:46 PM

## 2017-05-09 DIAGNOSIS — R112 Nausea with vomiting, unspecified: Secondary | ICD-10-CM | POA: Diagnosis not present

## 2017-05-09 DIAGNOSIS — R0602 Shortness of breath: Secondary | ICD-10-CM | POA: Diagnosis not present

## 2017-05-09 DIAGNOSIS — R072 Precordial pain: Secondary | ICD-10-CM | POA: Diagnosis not present

## 2017-05-09 DIAGNOSIS — R079 Chest pain, unspecified: Secondary | ICD-10-CM | POA: Diagnosis not present

## 2017-05-09 DIAGNOSIS — Z79899 Other long term (current) drug therapy: Secondary | ICD-10-CM | POA: Diagnosis not present

## 2017-05-09 DIAGNOSIS — R061 Stridor: Secondary | ICD-10-CM | POA: Diagnosis not present

## 2017-05-09 DIAGNOSIS — R0789 Other chest pain: Secondary | ICD-10-CM | POA: Diagnosis not present

## 2017-05-12 ENCOUNTER — Ambulatory Visit (INDEPENDENT_AMBULATORY_CARE_PROVIDER_SITE_OTHER): Payer: 59 | Admitting: Advanced Practice Midwife

## 2017-05-12 ENCOUNTER — Encounter: Payer: Self-pay | Admitting: Advanced Practice Midwife

## 2017-05-12 NOTE — Progress Notes (Signed)
Patricia GraveJudy C [name] is a 26 y.o. who presents for a postpartum visit. She is 4 weeks postpartum following a spontaneous vaginal delivery. I have fully reviewed the prenatal and intrapartum course. The delivery was at 39 gestational weeks. IOL for CHTN, (athough dx at 12 weeks, pt says its gestational!_) Anesthesia: epidural. Postpartum course has been unevnetful. Baby's course has been uneventful. Baby is feeding by bottle. Bleeding: no bleeding. Bowel function is normal. Bladder function is normal. Patient is not sexually active. Contraception method is none. Postpartum depression screening: negative.   Current Outpatient Prescriptions:  .  Prenat-FeBis-FePro-FA-CA-Omega (COMPLETE NATAL DHA) 29-1-200 & 250 MG MISC, Take 1 tablet by mouth every evening., Disp: , Rfl:  .  omeprazole (PRILOSEC) 10 MG capsule, Take 1 capsule (10 mg total) by mouth daily. (Patient not taking: Reported on 04/21/2017), Disp: 30 capsule, Rfl: 6 .  PROAIR HFA 108 (90 Base) MCG/ACT inhaler, Inhale 2 puffs into the lungs every 4 (four) hours as needed for wheezing or shortness of breath. (Patient not taking: Reported on 05/12/2017), Disp: 1 Inhaler, Rfl: 4  Review of Systems   Constitutional: Negative for fever and chills Eyes: Negative for visual disturbances Respiratory: Negative for shortness of breath, dyspnea Cardiovascular: Negative for chest pain or palpitations  Gastrointestinal: Negative for vomiting, diarrhea and constipation Genitourinary: Negative for dysuria and urgency Musculoskeletal: Negative for back pain, joint pain, myalgias  Neurological: Negative for dizziness and headaches    Objective:     Vitals:   05/12/17 1429  BP: 130/82  Pulse: 70   General:  alert, cooperative and no distress   Breasts:  negative  Lungs: clear to auscultation bilaterally  Heart:  regular rate and rhythm  Abdomen: Soft, nontender   Vulva:  normal  Vagina: normal vagina  Cervix:  closed  Corpus: Well involuted      Rectal Exam: no hemorrhoids        Assessment:    nomral postpartum exam. CHTN, not needing meds.  Last med Friday  Plan:   1. Contraception: Nexplanon 2. Follow up in: as scheduled for NExplanon 3. Has a BP cuff at home. Keep eye ob BP and Notify if >140/90         or as needed.

## 2017-05-19 ENCOUNTER — Encounter: Payer: Self-pay | Admitting: Advanced Practice Midwife

## 2017-05-19 ENCOUNTER — Ambulatory Visit (INDEPENDENT_AMBULATORY_CARE_PROVIDER_SITE_OTHER): Payer: 59 | Admitting: Advanced Practice Midwife

## 2017-05-19 VITALS — BP 122/80 | HR 72 | Wt 254.0 lb

## 2017-05-19 DIAGNOSIS — Z30017 Encounter for initial prescription of implantable subdermal contraceptive: Secondary | ICD-10-CM

## 2017-05-19 DIAGNOSIS — Z3202 Encounter for pregnancy test, result negative: Secondary | ICD-10-CM | POA: Diagnosis not present

## 2017-05-19 DIAGNOSIS — Z3046 Encounter for surveillance of implantable subdermal contraceptive: Secondary | ICD-10-CM | POA: Diagnosis not present

## 2017-05-19 NOTE — Progress Notes (Signed)
  HPI:  Patricia [name] is a 26 y.o. year old African American female here for Nexplanon insertion.  She is postparutm and her pregnancy test today was negative.  Risks/benefits/side effects of Nexplanon have been discussed and her questions have been answered.  Specifically, a failure rate of 11/998 has been reported, with an increased failure rate if pt takes St. John's Wort and/or antiseizure medicaitons.  Patricia [name] is aware of the common side effect of irregular bleeding, which the incidence of decreases over time.   Past Medical History: Past Medical History:  Diagnosis Date  . Asthma   . Cough 08/01/2015  . Migraine   . Migraines   . Nausea and vomiting during pregnancy prior to [redacted] weeks gestation 08/01/2015  . PCO (polycystic ovaries) 03/08/2014  . Polycystic disease, ovaries   . Pregnancy induced hypertension   . Pregnant 05/27/2015  . URI (upper respiratory infection) 08/01/2015    Past Surgical History: Past Surgical History:  Procedure Laterality Date  . TONSILLECTOMY    . WISDOM TOOTH EXTRACTION      Family History: Family History  Problem Relation Age of Onset  . Migraines Sister   . Hypertension Brother   . Heart disease Maternal Grandmother   . Cancer Maternal Grandfather        prostate  . Stroke Paternal Grandmother   . Cancer Paternal Grandfather        prostate  . Stroke Paternal Grandfather   . Atrial fibrillation Father     Social History: Social History  Substance Use Topics  . Smoking status: Never Smoker  . Smokeless tobacco: Never Used  . Alcohol use No     Comment: occassional    Allergies:  Allergies  Allergen Reactions  . Peanut-Containing Drug Products Anaphylaxis and Hives  . Propranolol Shortness Of Breath  . Cephalexin Hives  . Imitrex [Sumatriptan] Hives      Her left arm, approximatly 4 inches proximal from the elbow, was cleansed with alcohol and anesthetized with 2cc of 2% Lidocaine.  The area was cleansed again and the  Nexplanon was inserted without difficulty.  A pressure bandage was applied.  Pt was instructed to remove pressure bandage in a few hours, and keep insertion site covered with a bandaid for 3 days.  Back up contraception was recommended for 2 weeks.  Follow-up scheduled PRN problems  CRESENZO-DISHMAN,Tesa Meadors 05/19/2017 2:44 PM

## 2017-05-23 DIAGNOSIS — M79659 Pain in unspecified thigh: Secondary | ICD-10-CM | POA: Diagnosis not present

## 2017-05-26 DIAGNOSIS — J40 Bronchitis, not specified as acute or chronic: Secondary | ICD-10-CM | POA: Diagnosis not present

## 2017-05-26 DIAGNOSIS — R1011 Right upper quadrant pain: Secondary | ICD-10-CM | POA: Diagnosis not present

## 2017-05-26 DIAGNOSIS — R05 Cough: Secondary | ICD-10-CM | POA: Diagnosis not present

## 2017-05-27 ENCOUNTER — Telehealth: Payer: Self-pay | Admitting: Obstetrics & Gynecology

## 2017-05-27 ENCOUNTER — Telehealth: Payer: Self-pay | Admitting: *Deleted

## 2017-05-27 ENCOUNTER — Encounter (HOSPITAL_COMMUNITY): Payer: Self-pay | Admitting: Emergency Medicine

## 2017-05-27 ENCOUNTER — Emergency Department (HOSPITAL_COMMUNITY)
Admission: EM | Admit: 2017-05-27 | Discharge: 2017-05-27 | Disposition: A | Discharge status: Home / Self Care | Payer: 59 | Attending: Emergency Medicine | Admitting: Emergency Medicine

## 2017-05-27 ENCOUNTER — Emergency Department (HOSPITAL_COMMUNITY): Payer: 59

## 2017-05-27 DIAGNOSIS — Z91010 Allergy to peanuts: Secondary | ICD-10-CM | POA: Insufficient documentation

## 2017-05-27 DIAGNOSIS — R05 Cough: Secondary | ICD-10-CM | POA: Diagnosis not present

## 2017-05-27 DIAGNOSIS — J40 Bronchitis, not specified as acute or chronic: Secondary | ICD-10-CM

## 2017-05-27 DIAGNOSIS — Z79899 Other long term (current) drug therapy: Secondary | ICD-10-CM | POA: Insufficient documentation

## 2017-05-27 DIAGNOSIS — J45909 Unspecified asthma, uncomplicated: Secondary | ICD-10-CM | POA: Diagnosis not present

## 2017-05-27 MED ORDER — BENZONATATE 100 MG PO CAPS
200.0000 mg | ORAL_CAPSULE | Freq: Once | ORAL | Status: AC
Start: 2017-05-27 — End: 2017-05-27
  Administered 2017-05-27: 200 mg via ORAL
  Filled 2017-05-27: qty 2

## 2017-05-27 MED ORDER — PREDNISONE 20 MG PO TABS: 40.0000 mg | ORAL_TABLET | Freq: Every day | ORAL | 0 refills | Status: DC

## 2017-05-27 MED ORDER — ALBUTEROL SULFATE (2.5 MG/3ML) 0.083% IN NEBU
5.0000 mg | INHALATION_SOLUTION | Freq: Once | RESPIRATORY_TRACT | Status: AC
  Administered 2017-05-27: 5 mg via RESPIRATORY_TRACT
  Filled 2017-05-27: qty 6

## 2017-05-27 MED ORDER — BENZONATATE 100 MG PO CAPS: 100.0000 mg | ORAL_CAPSULE | Freq: Three times a day (TID) | ORAL | 0 refills | Status: DC

## 2017-05-27 MED ORDER — PREDNISONE 20 MG PO TABS
40.0000 mg | ORAL_TABLET | Freq: Once | ORAL | Status: AC
  Administered 2017-05-27: 40 mg via ORAL
  Filled 2017-05-27: qty 2

## 2017-05-27 MED ORDER — ALBUTEROL SULFATE HFA 108 (90 BASE) MCG/ACT IN AERS: 2.0000 | INHALATION_SPRAY | RESPIRATORY_TRACT | 3 refills | Status: DC | PRN

## 2017-05-27 NOTE — ED Triage Notes (Signed)
Pt c/o cough and sob x 2 weeks. Pt has hx of asthma and states inhaler is not working.

## 2017-05-27 NOTE — ED Provider Notes (Signed)
[facility] DEPT Provider Note   CSN: [id number] Arrival date & time: 05/27/17  2027     History   Chief Complaint Chief Complaint  Patient presents with  . Cough    HPI Patricia [name] is a 26 y.o. female.  HPI  The patient is a 26 year old female who has a history of reactive airway disease, she states that it is not specifically asthma. She reports that she has had a cough for approximately 2 weeks. It is a dry cough, she has no chest pain, she has minimal shortness of breath with this and no fevers. Denies any swelling of the legs or any other risk factors for pulmonary embolism other than birth control. She states that she has been using her albuterol inhaler with minimal improvement. She continues to cough. No fevers, no chills, no swelling of the legs. She denies any travel. She does state that she has some seasonal allergies and has taken a Claritin but states it did not help and denies any runny nose or itchy watery eyes at this time.  Past Medical History:  Diagnosis Date  . Asthma   . Cough 08/01/2015  . Migraine   . Migraines   . Nausea and vomiting during pregnancy prior to [redacted] weeks gestation 08/01/2015  . PCO (polycystic ovaries) 03/08/2014  . Polycystic disease, ovaries   . Pregnancy induced hypertension   . Pregnant 05/27/2015  . URI (upper respiratory infection) 08/01/2015    Patient Active Problem List   Diagnosis Date Noted  . 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 Chronic hypertension in pregnancy 04/13/2017  . Chronic hypertension affecting pregnancy 11/08/2016  . 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 Supervision of high risk pregnancy, antepartum 09/27/2016  . History of gestational hypertension 09/27/2016  . History of shoulder dystocia in prior pregnancy, currently pregnant 09/27/2016  . URI (upper respiratory infection) 08/01/2015  . Persistent cough 08/01/2015  . Headache 09/23/2014  .  PCO (polycystic ovaries) 03/08/2014    Past Surgical History:  Procedure Laterality Date  . TONSILLECTOMY    . WISDOM TOOTH EXTRACTION      OB History    Gravida Para Term Preterm AB Living   3 2 2   1 2    SAB TAB Ectopic Multiple Live Births   1     0 2       Home Medications    Prior to Admission medications   Medication Sig Start Date End Date Taking? Authorizing Provider  acetaminophen (TYLENOL) 500 MG tablet Take 500 mg by mouth every 6 (six) hours as needed.   Yes [provider]  butalbital-acetaminophen-caffeine (FIORICET, ESGIC) 50-325-40 MG tablet TAKE ONE TO TWO TABLETS BY MOUTH EVERY 6 HOURS AS NEEDED FOR HEADACHES 05/23/17  Yes [provider]  loratadine (CLARITIN) 10 MG tablet Take 10 mg by mouth daily as needed for allergies.   Yes [provider]  Prenat-FeBis-FePro-FA-CA-Omega (COMPLETE NATAL DHA) 29-1-200 & 250 MG MISC Take 1 tablet by mouth every evening.   Yes [provider]  PREVIDENT 5000 BOOSTER PLUS 1.1 % PSTE Apply topically daily.  05/23/17  Yes [provider]  albuterol (PROVENTIL HFA;VENTOLIN HFA) 108 (90 Base) MCG/ACT inhaler Inhale 2 puffs into the lungs every 4 (four) hours as needed for wheezing or shortness of breath. 05/27/17   Eber HongMiller, Kyson Kupper, MD  benzonatate (TESSALON) 100 MG capsule Take 1 capsule (100 mg total) by mouth every 8 (eight) hours. 05/27/17  Eber Hong, MD  predniSONE (DELTASONE) 20 MG tablet Take 2 tablets (40 mg total) by mouth daily. 05/27/17   Eber Hong, MD    Family History Family History  Problem Relation Age of Onset  . Migraines Sister   . Hypertension Brother   . Heart disease Maternal Grandmother   . Cancer Maternal Grandfather        prostate  . Stroke Paternal Grandmother   . Cancer Paternal Grandfather        prostate  . Stroke Paternal Grandfather   . Atrial fibrillation Father     Social History Social History  Substance Use Topics  . Smoking status: Never  Smoker  . Smokeless tobacco: Never Used  . Alcohol use No     Comment: occassional     Allergies   Peanut-containing drug products; Propranolol; Cephalexin; and Imitrex [sumatriptan]   Review of Systems Review of Systems  All other systems reviewed and are negative.    Physical Exam Updated Vital Signs BP 111/82 (BP Location: Left Arm)   Pulse 90   Temp 98.3 F (36.8 C) (Oral)   Resp 18   Ht 5\' 5"  (1.651 m)   Wt 115.2 kg (254 lb)   SpO2 98%   BMI 42.27 kg/m   Physical Exam  Constitutional: She appears well-developed and well-nourished. No distress.  HENT:  Head: Normocephalic and atraumatic.  Mouth/Throat: Oropharynx is clear and moist. No oropharyngeal exudate.  Oropharynx clear and moist  Eyes: Conjunctivae and EOM are normal. Pupils are equal, round, and reactive to light. Right eye exhibits no discharge. Left eye exhibits no discharge. No scleral icterus.  Neck: Normal range of motion. Neck supple. No JVD present. No thyromegaly present.  Cardiovascular: Normal rate, regular rhythm, normal heart sounds and intact distal pulses.  Exam reveals no gallop and no friction rub.   No murmur heard. Pulmonary/Chest: Effort normal and breath sounds normal. No respiratory distress. She has no wheezes. She has no rales.  The patient coughs frequently but has no abnormal lung sounds  Abdominal: Soft. Bowel sounds are normal. She exhibits no distension and no mass. There is no tenderness.  Musculoskeletal: Normal range of motion. She exhibits no edema or tenderness.  Lymphadenopathy:    She has no cervical adenopathy.  Neurological: She is alert. Coordination normal.  Skin: Skin is warm and dry. No rash noted. No erythema.  Psychiatric: She has a normal mood and affect. Her behavior is normal.  Nursing note and vitals reviewed.    ED Treatments / Results  Labs (all labs ordered are listed, but only abnormal results are displayed) Labs Reviewed - No data to  display   Radiology Dg Chest 2 View  Result Date: 05/27/2017 CLINICAL DATA:  Nonproductive cough and dyspnea x2 weeks. EXAM: CHEST  2 VIEW COMPARISON:  None. FINDINGS: The heart size and mediastinal contours are within normal limits. Mild interstitial prominence bilaterally which may represent acute bronchitic change. No alveolar consolidation to suggest pneumonia. No effusion or pneumothorax. The visualized skeletal structures are unremarkable. IMPRESSION: Interstitial prominence which may reflect acute bronchitic change of the lungs. Electronically Signed   By: Tollie Eth M.D.   On: 05/27/2017 22:14    Procedures Procedures (including critical care time)  Medications Ordered in ED Medications  albuterol (PROVENTIL) (2.5 MG/3ML) 0.083% nebulizer solution 5 mg (5 mg Nebulization Given 05/27/17 2146)  predniSONE (DELTASONE) tablet 40 mg (40 mg Oral Given 05/27/17 2138)  benzonatate (TESSALON) capsule 200 mg (200 mg Oral Given  05/27/17 2138)     Initial Impression / Assessment and Plan / ED Course  I have reviewed the triage vital signs and the nursing notes.  Pertinent labs & imaging results that were available during my care of the patient were reviewed by me and considered in my medical decision making (see chart for details).     Ongoing coughing in a patient with reactive airway disease with an otherwise normal exam. We'll obtain a chest x-ray, albuterol treatment, likely steroids cough medicines anticipate discharge.  Xray neg Bronchitis Pt in agreement.  Final Clinical Impressions(s) / ED Diagnoses   Final diagnoses:  Bronchitis    New Prescriptions New Prescriptions   ALBUTEROL (PROVENTIL HFA;VENTOLIN HFA) 108 (90 BASE) MCG/ACT INHALER    Inhale 2 puffs into the lungs every 4 (four) hours as needed for wheezing or shortness of breath.   BENZONATATE (TESSALON) 100 MG CAPSULE    Take 1 capsule (100 mg total) by mouth every 8 (eight) hours.   PREDNISONE (DELTASONE) 20 MG  TABLET    Take 2 tablets (40 mg total) by mouth daily.     Eber Hong, MD 05/27/17 2225

## 2017-05-27 NOTE — Telephone Encounter (Signed)
LMOVM returning call 

## 2017-05-27 NOTE — Discharge Instructions (Signed)
Please obtain all of your results from medical records or have your doctors office obtain the results - share them with your doctor - you should be seen at your doctors office in the next 2 days. Call today to arrange your follow up. Take the medications as prescribed. Please review all of the medicines and only take them if you do not have an allergy to them. Please be aware that if you are taking birth control pills, taking other prescriptions, ESPECIALLY ANTIBIOTICS may make the birth control ineffective - if this is the case, either do not engage in sexual activity or use alternative methods of birth control such as condoms until you have finished the medicine and your family doctor says it is OK to restart them. If you are on a blood thinner such as COUMADIN, be aware that any other medicine that you take may cause the coumadin to either work too much, or not enough - you should have your coumadin level rechecked in next 7 days if this is the case.  ?  It is also a possibility that you have an allergic reaction to any of the medicines that you have been prescribed - Everybody reacts differently to medications and while MOST people have no trouble with most medicines, you may have a reaction such as nausea, vomiting, rash, swelling, shortness of breath. If this is the case, please stop taking the medicine immediately and contact your physician.  ?  You should return to the ER if you develop severe or worsening symptoms.   Tessalon - 100mg  by mouth 3 times daily as needed for coughing Albuterol - every 4 hours as needed for cough / wheezing Prednisone - 40mg  by mouth daily for 5 days.

## 2017-05-27 NOTE — Telephone Encounter (Signed)
Pt called stating that she would like to speak with Tish, Pt did not state the reason why. Please contact pt

## 2017-05-27 NOTE — ED Notes (Signed)
Pt ambulatory to waiting room. Pt verbalized understanding of discharge instructions.   

## 2017-05-27 NOTE — Telephone Encounter (Signed)
Patient called to ask if prednisone is safe to take while breastfeeding. Discussed with Dr Despina HiddenEure who stated it was fine. Pt verbalized understanding.

## 2017-05-27 NOTE — ED Notes (Signed)
Patient transported to X-ray 

## 2017-06-04 ENCOUNTER — Encounter (HOSPITAL_COMMUNITY): Payer: Self-pay | Admitting: Emergency Medicine

## 2017-06-04 ENCOUNTER — Inpatient Hospital Stay (HOSPITAL_COMMUNITY)
Admission: EM | Admit: 2017-06-04 | Discharge: 2017-06-08 | DRG: 418 | Disposition: A | Discharge status: Home / Self Care | Payer: 59 | Attending: General Surgery | Admitting: General Surgery

## 2017-06-04 ENCOUNTER — Emergency Department (HOSPITAL_COMMUNITY): Payer: 59

## 2017-06-04 DIAGNOSIS — Z8759 Personal history of other complications of pregnancy, childbirth and the puerperium: Secondary | ICD-10-CM | POA: Diagnosis not present

## 2017-06-04 DIAGNOSIS — R1011 Right upper quadrant pain: Secondary | ICD-10-CM

## 2017-06-04 DIAGNOSIS — G43909 Migraine, unspecified, not intractable, without status migrainosus: Secondary | ICD-10-CM | POA: Diagnosis present

## 2017-06-04 DIAGNOSIS — R945 Abnormal results of liver function studies: Secondary | ICD-10-CM | POA: Diagnosis not present

## 2017-06-04 DIAGNOSIS — R112 Nausea with vomiting, unspecified: Secondary | ICD-10-CM | POA: Diagnosis not present

## 2017-06-04 DIAGNOSIS — Z888 Allergy status to other drugs, medicaments and biological substances status: Secondary | ICD-10-CM | POA: Diagnosis not present

## 2017-06-04 DIAGNOSIS — R740 Nonspecific elevation of levels of transaminase and lactic acid dehydrogenase [LDH]: Secondary | ICD-10-CM | POA: Diagnosis present

## 2017-06-04 DIAGNOSIS — J45909 Unspecified asthma, uncomplicated: Secondary | ICD-10-CM | POA: Diagnosis present

## 2017-06-04 DIAGNOSIS — Z91010 Allergy to peanuts: Secondary | ICD-10-CM

## 2017-06-04 DIAGNOSIS — R197 Diarrhea, unspecified: Secondary | ICD-10-CM | POA: Diagnosis not present

## 2017-06-04 DIAGNOSIS — R7989 Other specified abnormal findings of blood chemistry: Secondary | ICD-10-CM | POA: Diagnosis present

## 2017-06-04 DIAGNOSIS — K8010 Calculus of gallbladder with chronic cholecystitis without obstruction: Secondary | ICD-10-CM | POA: Diagnosis not present

## 2017-06-04 DIAGNOSIS — K8050 Calculus of bile duct without cholangitis or cholecystitis without obstruction: Secondary | ICD-10-CM

## 2017-06-04 DIAGNOSIS — E282 Polycystic ovarian syndrome: Secondary | ICD-10-CM | POA: Diagnosis present

## 2017-06-04 DIAGNOSIS — R748 Abnormal levels of other serum enzymes: Secondary | ICD-10-CM | POA: Diagnosis present

## 2017-06-04 DIAGNOSIS — Z79899 Other long term (current) drug therapy: Secondary | ICD-10-CM

## 2017-06-04 DIAGNOSIS — K8000 Calculus of gallbladder with acute cholecystitis without obstruction: Principal | ICD-10-CM | POA: Diagnosis present

## 2017-06-04 DIAGNOSIS — Z881 Allergy status to other antibiotic agents status: Secondary | ICD-10-CM | POA: Diagnosis not present

## 2017-06-04 DIAGNOSIS — R7401 Elevation of levels of liver transaminase levels: Secondary | ICD-10-CM

## 2017-06-04 DIAGNOSIS — R109 Unspecified abdominal pain: Secondary | ICD-10-CM | POA: Diagnosis present

## 2017-06-04 DIAGNOSIS — N3001 Acute cystitis with hematuria: Secondary | ICD-10-CM | POA: Diagnosis not present

## 2017-06-04 DIAGNOSIS — K8020 Calculus of gallbladder without cholecystitis without obstruction: Secondary | ICD-10-CM | POA: Diagnosis not present

## 2017-06-04 DIAGNOSIS — Z6841 Body Mass Index (BMI) 40.0 and over, adult: Secondary | ICD-10-CM

## 2017-06-04 DIAGNOSIS — I10 Essential (primary) hypertension: Secondary | ICD-10-CM | POA: Diagnosis not present

## 2017-06-04 DIAGNOSIS — K810 Acute cholecystitis: Secondary | ICD-10-CM | POA: Diagnosis not present

## 2017-06-04 LAB — DIFFERENTIAL
Basophils Absolute: 0 10*3/uL (ref 0.0–0.1)
Basophils Relative: 0 %
EOS PCT: 0 %
Eosinophils Absolute: 0 10*3/uL (ref 0.0–0.7)
LYMPHS ABS: 1.6 10*3/uL (ref 0.7–4.0)
Lymphocytes Relative: 17 %
MONO ABS: 0.6 10*3/uL (ref 0.1–1.0)
MONOS PCT: 6 %
NEUTROS ABS: 7.4 10*3/uL (ref 1.7–7.7)
Neutrophils Relative %: 77 %

## 2017-06-04 LAB — CBC
HCT: 40.8 % (ref 36.0–46.0)
HEMOGLOBIN: 13.4 g/dL (ref 12.0–15.0)
MCH: 26.1 pg (ref 26.0–34.0)
MCHC: 32.8 g/dL (ref 30.0–36.0)
MCV: 79.5 fL (ref 78.0–100.0)
PLATELETS: 316 10*3/uL (ref 150–400)
RBC: 5.13 MIL/uL — ABNORMAL HIGH (ref 3.87–5.11)
RDW: 14.3 % (ref 11.5–15.5)
WBC: 10 10*3/uL (ref 4.0–10.5)

## 2017-06-04 LAB — COMPREHENSIVE METABOLIC PANEL
ALBUMIN: 3.9 g/dL (ref 3.5–5.0)
ALK PHOS: 173 U/L — AB (ref 38–126)
ALT: 306 U/L — AB (ref 14–54)
AST: 546 U/L — ABNORMAL HIGH (ref 15–41)
Anion gap: 8 (ref 5–15)
BILIRUBIN TOTAL: 2.4 mg/dL — AB (ref 0.3–1.2)
BUN: 12 mg/dL (ref 6–20)
CALCIUM: 9 mg/dL (ref 8.9–10.3)
CO2: 28 mmol/L (ref 22–32)
CREATININE: 0.75 mg/dL (ref 0.44–1.00)
Chloride: 104 mmol/L (ref 101–111)
GFR calc non Af Amer: >60 mL/min (ref >60)
GLUCOSE: 111 mg/dL — AB (ref 65–99)
Potassium: 4.1 mmol/L (ref 3.5–5.1)
Sodium: 140 mmol/L (ref 135–145)
TOTAL PROTEIN: 7.6 g/dL (ref 6.5–8.1)

## 2017-06-04 LAB — URINALYSIS, ROUTINE W REFLEX MICROSCOPIC
Bacteria, UA: NONE SEEN
GLUCOSE, UA: NEGATIVE mg/dL
Ketones, ur: NEGATIVE mg/dL
NITRITE: NEGATIVE
PH: 6 (ref 5.0–8.0)
PROTEIN: NEGATIVE mg/dL
Specific Gravity, Urine: 1.039 — ABNORMAL HIGH (ref 1.005–1.030)

## 2017-06-04 LAB — PREGNANCY, URINE: PREG TEST UR: NEGATIVE

## 2017-06-04 LAB — BILIRUBIN, FRACTIONATED(TOT/DIR/INDIR)
BILIRUBIN INDIRECT: 1.2 mg/dL — AB (ref 0.3–0.9)
Bilirubin, Direct: 1.5 mg/dL — ABNORMAL HIGH (ref 0.1–0.5)
Total Bilirubin: 2.7 mg/dL — ABNORMAL HIGH (ref 0.3–1.2)

## 2017-06-04 LAB — LIPASE, BLOOD: Lipase: 27 U/L (ref 11–51)

## 2017-06-04 MED ORDER — PRENATAL MULTIVITAMIN CH
1.0000 | ORAL_TABLET | Freq: Every evening | ORAL | Status: DC
  Administered 2017-06-05 – 2017-06-08 (×4): 1 via ORAL
  Filled 2017-06-04 (×5): qty 1

## 2017-06-04 MED ORDER — MORPHINE SULFATE (PF) 4 MG/ML IV SOLN
4.0000 mg | INTRAVENOUS | Status: AC | PRN
  Administered 2017-06-04 (×2): 4 mg via INTRAVENOUS
  Filled 2017-06-04 (×2): qty 1

## 2017-06-04 MED ORDER — SODIUM CHLORIDE 0.9 % IV BOLUS (SEPSIS)
500.0000 mL | Freq: Once | INTRAVENOUS | Status: AC
  Administered 2017-06-04: 500 mL via INTRAVENOUS

## 2017-06-04 MED ORDER — MORPHINE SULFATE (PF) 2 MG/ML IV SOLN
2.0000 mg | INTRAVENOUS | Status: DC | PRN
  Administered 2017-06-05 – 2017-06-07 (×5): 2 mg via INTRAVENOUS
  Filled 2017-06-04 (×5): qty 1

## 2017-06-04 MED ORDER — ONDANSETRON HCL 4 MG/2ML IJ SOLN
4.0000 mg | INTRAMUSCULAR | Status: AC | PRN
  Administered 2017-06-04 (×2): 4 mg via INTRAVENOUS
  Filled 2017-06-04 (×2): qty 2

## 2017-06-04 MED ORDER — DEXTROSE-NACL 5-0.9 % IV SOLN
INTRAVENOUS | Status: DC
  Administered 2017-06-04 – 2017-06-08 (×8): via INTRAVENOUS

## 2017-06-04 MED ORDER — SODIUM CHLORIDE 0.9 % IV SOLN
INTRAVENOUS | Status: DC
  Administered 2017-06-04: 17:00:00 via INTRAVENOUS

## 2017-06-04 MED ORDER — IOPAMIDOL (ISOVUE-300) INJECTION 61%
100.0000 mL | Freq: Once | INTRAVENOUS | Status: AC | PRN
  Administered 2017-06-04: 100 mL via INTRAVENOUS

## 2017-06-04 MED ORDER — ONDANSETRON HCL 4 MG/2ML IJ SOLN
4.0000 mg | Freq: Four times a day (QID) | INTRAMUSCULAR | Status: DC | PRN
  Administered 2017-06-05 – 2017-06-07 (×2): 4 mg via INTRAVENOUS
  Filled 2017-06-04 (×3): qty 2

## 2017-06-04 NOTE — ED Triage Notes (Signed)
Intermittent RUQ pain with n/v x 4 weeks. Today's episode began at 0800 this am. Pt is 7 weeks post partum with history of gestational htn.

## 2017-06-04 NOTE — H&P (Signed)
History and Physical    Patricia [name]:[id number] DOB: 03/17/1991 DOA: 06/04/2017  PCP: Benita Stabile, MD  Patient coming from: Home.    Chief Complaint: abdominal pain, acute, and severe.   HPI: Patricia [name] is an 26 y.o. female s/p recent vaginal delivery May 2018, currently breast feeding, hx of PCOS, gestational HTN, obesity, hx of migraine, presents to the ER with several weeks of intermittent RUQ pain radiating to the right flank.  She has no fever, chills, or any other GI or GU symptoms.  Evaluation in the ER showed elevated LFTs to the 500's, with alkphos 175, and total bili of 2.7.  CT of her abd/pelvic showed borderline hepatomegaly and perihepatic edema.  She has no fever, and no leukocytosis.   She was given IV morphine, and IVF, and felt better.  EDP spoke with GI, Dr Kendell Bane, and with Surgery Dr Earlene Plater, and it was felt that she may have had a passed stone.  Hospitalist was asked to admit her for same, to tx symptoms, follow up with LFTs, and for consideration of lap CCY.   ED Course:  See above.  Rewiew of Systems:  Constitutional: Negative for malaise, fever and chills. No significant weight loss or weight gain Eyes: Negative for eye pain, redness and discharge, diplopia, visual changes, or flashes of light. ENMT: Negative for ear pain, hoarseness, nasal congestion, sinus pressure and sore throat. No headaches; tinnitus, drooling, or problem swallowing. Cardiovascular: Negative for chest pain, palpitations, diaphoresis, dyspnea and peripheral edema. ; No orthopnea, PND Respiratory: Negative for cough, hemoptysis, wheezing and stridor. No pleuritic chestpain. Gastrointestinal: Negative for diarrhea, constipation,  melena, blood in stool, hematemesis, jaundice and rectal bleeding.    Genitourinary: Negative for frequency, dysuria, incontinence,flank pain and hematuria; Musculoskeletal: Negative for back pain and neck pain. Negative for swelling and trauma.;  Skin: . Negative  for pruritus, rash, abrasions, bruising and skin lesion.; ulcerations Neuro: Negative for headache, lightheadedness and neck stiffness. Negative for weakness, altered level of consciousness , altered mental status, extremity weakness, burning feet, involuntary movement, seizure and syncope.  Psych: negative for anxiety, depression, insomnia, tearfulness, panic attacks, hallucinations, paranoia, suicidal or homicidal ideation    Past Medical History:  Diagnosis Date  . Asthma   . Cough 08/01/2015  . Migraine   . Migraines   . Nausea and vomiting during pregnancy prior to [redacted] weeks gestation 08/01/2015  . PCO (polycystic ovaries) 03/08/2014  . Polycystic disease, ovaries   . Pregnancy induced hypertension   . Pregnant 05/27/2015  . URI (upper respiratory infection) 08/01/2015    Past Surgical History:  Procedure Laterality Date  . TONSILLECTOMY    . WISDOM TOOTH EXTRACTION       reports that she has never smoked. She has never used smokeless tobacco. She reports that she does not drink alcohol or use drugs.  Allergies  Allergen Reactions  . Peanut-Containing Drug Products Anaphylaxis and Hives  . Propranolol Shortness Of Breath  . Cephalexin Hives  . Imitrex [Sumatriptan] Hives    Family History  Problem Relation Age of Onset  . Migraines Sister   . Hypertension Brother   . Heart disease Maternal Grandmother   . Cancer Maternal Grandfather        prostate  . Stroke Paternal Grandmother   . Cancer Paternal Grandfather        prostate  . Stroke Paternal Grandfather   . Atrial fibrillation Father      Prior to Admission medications  Medication Sig Start Date End Date Taking? Authorizing Provider  albuterol (PROVENTIL HFA;VENTOLIN HFA) 108 (90 Base) MCG/ACT inhaler Inhale 2 puffs into the lungs every 4 (four) hours as needed for wheezing or shortness of breath. 05/27/17  Yes Eber Hong, MD  Biotin 5000 MCG CAPS Take 1 capsule by mouth daily.   Yes [provider]    butalbital-acetaminophen-caffeine (FIORICET, ESGIC) 50-325-40 MG tablet TAKE ONE TO TWO TABLETS BY MOUTH EVERY 6 HOURS AS NEEDED FOR HEADACHES 05/23/17  Yes [provider]  etonogestrel (NEXPLANON) 68 MG IMPL implant 1 each by Subdermal route once.   Yes [provider]  loratadine (CLARITIN) 10 MG tablet Take 10 mg by mouth daily as needed for allergies.   Yes [provider]  Prenat-FeBis-FePro-FA-CA-Omega (COMPLETE NATAL DHA) 29-1-200 & 250 MG MISC Take 1 tablet by mouth every evening.   Yes [provider]  PREVIDENT 5000 BOOSTER PLUS 1.1 % PSTE Apply topically daily.  05/23/17   [provider]    Physical Exam: Vitals:   06/04/17 1525 06/04/17 1526 06/04/17 1805 06/04/17 1830  BP: (!) 149/93  129/70 122/76  Pulse: 63  (!) 54   Resp: 18  18   Temp: 99.2 F (37.3 C)     SpO2: 99%  97% 100%  Weight:  114.8 kg (253 lb)    Height:  5\' 5"  (1.651 m)      Constitutional: NAD, calm, comfortable Vitals:   06/04/17 1525 06/04/17 1526 06/04/17 1805 06/04/17 1830  BP: (!) 149/93  129/70 122/76  Pulse: 63  (!) 54   Resp: 18  18   Temp: 99.2 F (37.3 C)     SpO2: 99%  97% 100%  Weight:  114.8 kg (253 lb)    Height:  5\' 5"  (1.651 m)     Eyes: PERRL, lids and conjunctivae normal ENMT: Mucous membranes are moist. Posterior pharynx clear of any exudate or lesions.Normal dentition.  Neck: normal, supple, no masses, no thyromegaly Respiratory: clear to auscultation bilaterally, no wheezing, no crackles. Normal respiratory effort. No accessory muscle use.  Cardiovascular: Regular rate and rhythm, no murmurs / rubs / gallops. No extremity edema. 2+ pedal pulses. No carotid bruits.  Abdomen: no tenderness, no masses palpated. No hepatosplenomegaly. Bowel sounds positive.  Musculoskeletal: no clubbing / cyanosis. No joint deformity upper and lower extremities. Good ROM, no contractures. Normal muscle tone.  Skin: no rashes, lesions, ulcers. No  induration Neurologic: CN 2-12 grossly intact. Sensation intact, DTR normal. Strength 5/5 in all 4.  Psychiatric: Normal judgment and insight. Alert and oriented x 3. Normal mood.    Labs on Admission: I have personally reviewed following labs and imaging studies  CBC:  Recent Labs Lab 06/04/17 1539 06/04/17 1619  WBC 10.0  --   NEUTROABS  --  7.4  HGB 13.4  --   HCT 40.8  --   MCV 79.5  --   PLT 316  --    Basic Metabolic Panel:  Recent Labs Lab 06/04/17 1539  NA 140  K 4.1  CL 104  CO2 28  GLUCOSE 111*  BUN 12  CREATININE 0.75  CALCIUM 9.0   GFR: Estimated Creatinine Clearance: 134.8 mL/min (by C-G formula based on SCr of 0.75 mg/dL). Liver Function Tests:  Recent Labs Lab 06/04/17 1539 06/04/17 1832  AST 546*  --   ALT 306*  --   ALKPHOS 173*  --   BILITOT 2.4* 2.7*  PROT 7.6  --   ALBUMIN  3.9  --     Recent Labs Lab 06/04/17 1539  LIPASE 27   Urine analysis:    Component Value Date/Time   COLORURINE AMBER (A) 06/04/2017 1529   APPEARANCEUR CLEAR 06/04/2017 1529   APPEARANCEUR Cloudy (A) 09/27/2016 1636   LABSPEC 1.039 (H) 06/04/2017 1529   PHURINE 6.0 06/04/2017 1529   GLUCOSEU NEGATIVE 06/04/2017 1529   HGBUR SMALL (A) 06/04/2017 1529   BILIRUBINUR SMALL (A) 06/04/2017 1529   BILIRUBINUR Negative 09/27/2016 1636   KETONESUR NEGATIVE 06/04/2017 1529   PROTEINUR NEGATIVE 06/04/2017 1529   UROBILINOGEN 0.2 08/08/2015 0734   NITRITE NEGATIVE 06/04/2017 1529   LEUKOCYTESUR TRACE (A) 06/04/2017 1529   LEUKOCYTESUR Negative 09/27/2016 1636     Radiological Exams on Admission: Ct Abdomen Pelvis W Contrast  Result Date: 06/04/2017 CLINICAL DATA:  Upper abdominal pain.  Elevated LFTs. EXAM: CT ABDOMEN AND PELVIS WITH CONTRAST TECHNIQUE: Multidetector CT imaging of the abdomen and pelvis was performed using the standard protocol following bolus administration of intravenous contrast. CONTRAST:  100mL ISOVUE-300 IOPAMIDOL (ISOVUE-300) INJECTION  61% COMPARISON:  [facility]pelvis [facility]. FINDINGS: Lower chest: The lung bases are clear. No pleural fluid or consolidation. Hepatobiliary: The liver is prominent size spanning 20 cm craniocaudal. No focal hepatic lesion. There is minimal periportal edema. Gallbladder physiologically distended, no calcified stone. No biliary dilatation. Pancreas: No ductal dilatation or inflammation. Spleen: Normal in size without focal abnormality. Adrenals/Urinary Tract: Normal adrenal glands. Excreted contrast in both renal collecting systems likely residual from CT earlier this day. Homogeneous enhancement. No hydronephrosis or perinephric edema. Physiologically distended. Excreted intravenous contrast from prior CT. No bladder wall thickening. Stomach/Bowel: Stomach physiologically distended. No bowel obstruction or inflammation. Enteric contrast from prior CT is seen throughout the colon. Normal appendix. Vascular/Lymphatic: No significant vascular findings are present. No enlarged abdominal or pelvic lymph nodes. Reproductive: Uterus and bilateral adnexa are unremarkable. Other: No free air, free fluid, or intra-abdominal fluid collection. Musculoskeletal: There are no acute or suspicious osseous abnormalities. IMPRESSION: 1. Borderline mild hepatomegaly. Mild periportal edema, may be due to hydration status. Given elevated LFTs, hepatitis could produce a similar appearance. 2. Excreted intravenous contrast in the renal collecting systems and urinary bladder from CT earlier this day. Electronically Signed   By: Rubye OaksMelanie  Ehinger M.D.   On: 06/04/2017 17:42    EKG: Independently reviewed.   Assessment/Plan Principal Problem:   Abdominal pain Active Problems:   PCO (polycystic ovaries)   History of gestational hypertension    PLAN:   Abdominal pain:  RUQ with elevated LFTs.  It s possible she has a passed stone.  Will repeat LFTs in the am.  Obtain RUQ US and made NPO for  tonight. Will give IVF.   Can use morphine and Zofran for symptomatic Tx.  She will pump her breast mild for her enfant.  Discard mild the next 24 hours s/p constrast studies.  GI and Surgery will follow up tomorrow.  Will obtain acute hepatitis panel.   PCOS:  Noted.   Gestational HTN:  BP is controlled.    DVT prophylaxis: SCD>  Code Status: FULL CODE.  Family Communication: none at bedside.  Disposition Plan: Home.  Consults called: Surgery and GI.  Admission status: inpatient.    Kailena Lubas MD FACP. [facility]  If 7PM-7AM, please contact night-coverage [email or web address] Password [email or web address]  06/04/2017, 8:23 PM

## 2017-06-04 NOTE — ED Provider Notes (Signed)
[facility]-EMERGENCY DEPT Provider Note   CSN: [id number] Arrival date & time: 06/04/17  1512     History   Chief Complaint Chief Complaint  Patient presents with  . Abdominal Pain    HPI Patricia [name] is a 26 y.o. female.  HPI  Pt was seen at 1620.  Per pt, c/o gradual onset and persistence of constant RUQ abd "pain" since this morning. Pt states she has had this same pain intermittently over the past 4 weeks. Has been associated with multiple intermittent episodes of N/V/D.  Describes the abd pain as worsening after eating and worse at night. Pt states her only other evaluation was speaking with her midwife who "wanted me to wait until 12 weeks post-partum to get my pregnancy hormones down." Pt s/p NSVD on 04/14/2017 and is breast feeding.  Denies fevers, no back pain, no rash, no CP/SOB, no black or blood in stools or emesis.      Past Medical History:  Diagnosis Date  . Asthma   . Cough 08/01/2015  . Migraine   . Migraines   . Nausea and vomiting during pregnancy prior to [redacted] weeks gestation 08/01/2015  . PCO (polycystic ovaries) 03/08/2014  . Polycystic disease, ovaries   . Pregnancy induced hypertension   . Pregnant 05/27/2015  . URI (upper respiratory infection) 08/01/2015    Patient Active Problem List   Diagnosis Date Noted  . 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 Chronic hypertension in pregnancy 04/13/2017  . Chronic hypertension affecting pregnancy 11/08/2016  . 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 Supervision of high risk pregnancy, antepartum 09/27/2016  . History of gestational hypertension 09/27/2016  . History of shoulder dystocia in prior pregnancy, currently pregnant 09/27/2016  . URI (upper respiratory infection) 08/01/2015  . Persistent cough 08/01/2015  . Headache 09/23/2014  . PCO (polycystic ovaries) 03/08/2014    Past Surgical History:  Procedure Laterality Date  . TONSILLECTOMY    .  WISDOM TOOTH EXTRACTION      OB History    Gravida Para Term Preterm AB Living   3 2 2   1 2    SAB TAB Ectopic Multiple Live Births   1     0 2       Home Medications    Prior to Admission medications   Medication Sig Start Date End Date Taking? Authorizing Provider  acetaminophen (TYLENOL) 500 MG tablet Take 500 mg by mouth every 6 (six) hours as needed.    [provider]  albuterol (PROVENTIL HFA;VENTOLIN HFA) 108 (90 Base) MCG/ACT inhaler Inhale 2 puffs into the lungs every 4 (four) hours as needed for wheezing or shortness of breath. 05/27/17   Eber Hong, MD  benzonatate (TESSALON) 100 MG capsule Take 1 capsule (100 mg total) by mouth every 8 (eight) hours. 05/27/17   Eber Hong, MD  butalbital-acetaminophen-caffeine (FIORICET, ESGIC) 50-325-40 MG tablet TAKE ONE TO TWO TABLETS BY MOUTH EVERY 6 HOURS AS NEEDED FOR HEADACHES 05/23/17   [provider]  loratadine (CLARITIN) 10 MG tablet Take 10 mg by mouth daily as needed for allergies.    [provider]  predniSONE (DELTASONE) 20 MG tablet Take 2 tablets (40 mg total) by mouth daily. 05/27/17   Eber Hong, MD  Prenat-FeBis-FePro-FA-CA-Omega (COMPLETE NATAL DHA) 29-1-200 & 250 MG MISC Take 1 tablet by mouth every evening.    [provider]  PREVIDENT 5000 BOOSTER PLUS 1.1 % PSTE Apply topically daily.  05/23/17  [provider]    Family History Family History  Problem Relation Age of Onset  . Migraines Sister   . Hypertension Brother   . Heart disease Maternal Grandmother   . Cancer Maternal Grandfather        prostate  . Stroke Paternal Grandmother   . Cancer Paternal Grandfather        prostate  . Stroke Paternal Grandfather   . Atrial fibrillation Father     Social History Social History  Substance Use Topics  . Smoking status: Never Smoker  . Smokeless tobacco: Never Used  . Alcohol use No     Comment: occassional     Allergies   Peanut-containing drug  products; Propranolol; Cephalexin; and Imitrex [sumatriptan]   Review of Systems Review of Systems ROS: Statement: All systems negative except as marked or noted in the HPI; Constitutional: Negative for fever and chills. ; ; Eyes: Negative for eye pain, redness and discharge. ; ; ENMT: Negative for ear pain, hoarseness, nasal congestion, sinus pressure and sore throat. ; ; Cardiovascular: Negative for chest pain, palpitations, diaphoresis, dyspnea and peripheral edema. ; ; Respiratory: Negative for cough, wheezing and stridor. ; ; Gastrointestinal: +N/V/D, abd pain. Negative for blood in stool, hematemesis, jaundice and rectal bleeding. . ; ; Genitourinary: Negative for dysuria, flank pain and hematuria. ; ; Musculoskeletal: Negative for back pain and neck pain. Negative for swelling and trauma.; ; Skin: Negative for pruritus, rash, abrasions, blisters, bruising and skin lesion.; ; Neuro: Negative for headache, lightheadedness and neck stiffness. Negative for weakness, altered level of consciousness, altered mental status, extremity weakness, paresthesias, involuntary movement, seizure and syncope.       Physical Exam Updated Vital Signs BP (!) 149/93   Pulse 63   Temp 99.2 F (37.3 C)   Resp 18   Ht 5\' 5"  (1.651 m)   Wt 114.8 kg (253 lb)   LMP 05/28/2017   SpO2 99%   BMI 42.10 kg/m   BP 129/70 (BP Location: Left Arm)   Pulse (!) 54   Temp 99.2 F (37.3 C)   Resp 18   Ht 5\' 5"  (1.651 m)   Wt 114.8 kg (253 lb)   LMP 05/28/2017   SpO2 97%   BMI 42.10 kg/m    Physical Exam 1625: Physical examination:  Nursing notes reviewed; Vital signs and O2 SAT reviewed;  Constitutional: Well developed, Well nourished, Well hydrated, Uncomfortable appearing.; Head:  Normocephalic, atraumatic; Eyes: EOMI, PERRL, No scleral icterus; ENMT: Mouth and pharynx normal, Mucous membranes moist; Neck: Supple, Full range of motion, No lymphadenopathy; Cardiovascular: Regular rate and rhythm, No gallop;  Respiratory: Breath sounds clear & equal bilaterally, No wheezes.  Speaking full sentences with ease, Normal respiratory effort/excursion; Chest: Nontender, Movement normal; Abdomen: Soft, +RUQ tenderness to palp. No rebound or guarding.  Nondistended, Normal bowel sounds; Genitourinary: No CVA tenderness; Extremities: Pulses normal, No tenderness, No edema, No calf edema or asymmetry.; Neuro: AA&Ox3, Major CN grossly intact.  Speech clear. No gross focal motor or sensory deficits in extremities.; Skin: Color normal, Warm, Dry.   ED Treatments / Results  Labs (all labs ordered are listed, but only abnormal results are displayed)   EKG  EKG Interpretation None       Radiology   Procedures Procedures (including critical care time)  Medications Ordered in ED Medications  0.9 %  sodium chloride infusion ( Intravenous New Bag/Given 06/04/17 1634)  ondansetron (ZOFRAN) injection 4 mg (4 mg Intravenous Given 06/04/17 1633)  morphine 4 MG/ML injection 4 mg (4 mg Intravenous Given 06/04/17 1634)     Initial Impression / Assessment and Plan / ED Course  I have reviewed the triage vital signs and the nursing notes.  Pertinent labs & imaging results that were available during my care of the patient were reviewed by me and considered in my medical decision making (see chart for details).  MDM Reviewed: previous chart, nursing note and vitals Reviewed previous: labs Interpretation: labs and CT scan   Results for orders placed or performed during the hospital encounter of 06/04/17  Lipase, blood  Result Value Ref Range   Lipase 27 11 - 51 U/L  Comprehensive metabolic panel  Result Value Ref Range   Sodium 140 135 - 145 mmol/L   Potassium 4.1 3.5 - 5.1 mmol/L   Chloride 104 101 - 111 mmol/L   CO2 28 22 - 32 mmol/L   Glucose, Bld 111 (H) 65 - 99 mg/dL   BUN 12 6 - 20 mg/dL   Creatinine, Ser 1.32 0.44 - 1.00 mg/dL   Calcium 9.0 8.9 - 44.0 mg/dL   Total Protein 7.6 6.5 - 8.1 g/dL    Albumin 3.9 3.5 - 5.0 g/dL   AST 102 (H) 15 - 41 U/L   ALT 306 (H) 14 - 54 U/L   Alkaline Phosphatase 173 (H) 38 - 126 U/L   Total Bilirubin 2.4 (H) 0.3 - 1.2 mg/dL   GFR calc non Af Amer >60 >60 mL/min   GFR calc Af Amer >60 >60 mL/min   Anion gap 8 5 - 15  CBC  Result Value Ref Range   WBC 10.0 4.0 - 10.5 K/uL   RBC 5.13 (H) 3.87 - 5.11 MIL/uL   Hemoglobin 13.4 12.0 - 15.0 g/dL   HCT 72.5 36.6 - 44.0 %   MCV 79.5 78.0 - 100.0 fL   MCH 26.1 26.0 - 34.0 pg   MCHC 32.8 30.0 - 36.0 g/dL   RDW 34.7 42.5 - 95.6 %   Platelets 316 150 - 400 K/uL  Urinalysis, Routine w reflex microscopic  Result Value Ref Range   Color, Urine AMBER (A) YELLOW   APPearance CLEAR CLEAR   Specific Gravity, Urine 1.039 (H) 1.005 - 1.030   pH 6.0 5.0 - 8.0   Glucose, UA NEGATIVE NEGATIVE mg/dL   Hgb urine dipstick SMALL (A) NEGATIVE   Bilirubin Urine SMALL (A) NEGATIVE   Ketones, ur NEGATIVE NEGATIVE mg/dL   Protein, ur NEGATIVE NEGATIVE mg/dL   Nitrite NEGATIVE NEGATIVE   Leukocytes, UA TRACE (A) NEGATIVE   RBC / HPF 0-5 0 - 5 RBC/hpf   WBC, UA 0-5 0 - 5 WBC/hpf   Bacteria, UA NONE SEEN NONE SEEN   Squamous Epithelial / LPF 0-5 (A) NONE SEEN   Mucous PRESENT   Differential  Result Value Ref Range   Neutrophils Relative % 77 %   Neutro Abs 7.4 1.7 - 7.7 K/uL   Lymphocytes Relative 17 %   Lymphs Abs 1.6 0.7 - 4.0 K/uL   Monocytes Relative 6 %   Monocytes Absolute 0.6 0.1 - 1.0 K/uL   Eosinophils Relative 0 %   Eosinophils Absolute 0.0 0.0 - 0.7 K/uL   Basophils Relative 0 %   Basophils Absolute 0.0 0.0 - 0.1 K/uL  Pregnancy, urine  Result Value Ref Range   Preg Test, Ur NEGATIVE NEGATIVE    Ct Abdomen Pelvis W Contrast Result Date: 06/04/2017 CLINICAL DATA:  Upper abdominal pain.  Elevated  LFTs. EXAM: CT ABDOMEN AND PELVIS WITH CONTRAST TECHNIQUE: Multidetector CT imaging of the abdomen and pelvis was performed using the standard protocol following bolus administration of intravenous  contrast. CONTRAST:  ISOVUE-300 IOPAMIDOL (ISOVUE-300) INJECTION 61% COMPARISON:  CT abdomen/pelvis 13 hours prior at [facility]. FINDINGS: Lower chest: The lung bases are clear. No pleural fluid or consolidation. Hepatobiliary: The liver is prominent size spanning 20 cm craniocaudal. No focal hepatic lesion. There is minimal periportal edema. Gallbladder physiologically distended, no calcified stone. No biliary dilatation. Pancreas: No ductal dilatation or inflammation. Spleen: Normal in size without focal abnormality. Adrenals/Urinary Tract: Normal adrenal glands. Excreted contrast in both renal collecting systems likely residual from CT earlier this day. Homogeneous enhancement. No hydronephrosis or perinephric edema. Physiologically distended. Excreted intravenous contrast from prior CT. No bladder wall thickening. Stomach/Bowel: Stomach physiologically distended. No bowel obstruction or inflammation. Enteric contrast from prior CT is seen throughout the colon. Normal appendix. Vascular/Lymphatic: No significant vascular findings are present. No enlarged abdominal or pelvic lymph nodes. Reproductive: Uterus and bilateral adnexa are unremarkable. Other: No free air, free fluid, or intra-abdominal fluid collection. Musculoskeletal: There are no acute or suspicious osseous abnormalities. IMPRESSION: 1. Borderline mild hepatomegaly. Mild periportal edema, may be due to hydration status. Given elevated LFTs, hepatitis could produce a similar appearance. 2. Excreted intravenous contrast in the renal collecting systems and urinary bladder from CT earlier this day. Electronically Signed   By: Rubye Oaks M.D.   On: 06/04/2017 17:42    1630:  Pt told me and ED RN in exam room with me that she was breast feeding. I explained pain/nausea meds may not be appropriate for baby, and offered either to forgo meds at this time, or she would need to "pump and dump." Pt requested I give her meds; ED RN in  room during this conversation.   1800:   Pt questioned regarding CT statement above (previous CT scan today approximately 12 hours ago): pt now states she did go to another ED and have a CT completed, she stated she was told it, as well as her liver function tests, were normal.  Given new LFT elevation in the past 12 hours, concern for CBD stone.  T/C to General Surgery Dr. Mikki Santee, case discussed, including:  HPI, pertinent PM/SHx, VS/PE, dx testing, ED course and treatment:  Agrees that possible CBD stone causing recurrent symptoms and new LFT elevation over the course of the day, ageeable to consult, requests to obtain indirect/direct bilirubin, call GI to consult as pt will need ERCP/MRCP before GB removal; and [facility] to admit. [facility] ED records requested.   1815:  T/C to GI Dr. Jena Gauss, case discussed, including:  HPI, pertinent PM/SHx, VS/PE, dx testing, ED course and treatment:  Agreeable to consult, requests to admit to [facility], pt will need [place] RUQ and repeat LFT's tomorrow morning.   1920:  Dx and testing d/w pt.  Questions answered.  Verb understanding, agreeable to admit. T/C to [facility] Dr. Conley Rolls, case discussed, including:  HPI, pertinent PM/SHx, VS/PE, dx testing, ED course and treatment:  Agreeable to admit.   2025:  [facility] records received:  Tbili 0.4, AST 108.8, ALT 51, alkphos 116, lipase 46.    Final Clinical Impressions(s) / ED Diagnoses   Final diagnoses:  None    New Prescriptions New Prescriptions   No medications on file     Samuel Jester, DO 06/08/17 1610

## 2017-06-05 ENCOUNTER — Inpatient Hospital Stay (HOSPITAL_COMMUNITY): Payer: 59

## 2017-06-05 DIAGNOSIS — K8020 Calculus of gallbladder without cholecystitis without obstruction: Secondary | ICD-10-CM

## 2017-06-05 DIAGNOSIS — R1011 Right upper quadrant pain: Secondary | ICD-10-CM

## 2017-06-05 DIAGNOSIS — K8050 Calculus of bile duct without cholangitis or cholecystitis without obstruction: Secondary | ICD-10-CM

## 2017-06-05 DIAGNOSIS — Z8759 Personal history of other complications of pregnancy, childbirth and the puerperium: Secondary | ICD-10-CM

## 2017-06-05 LAB — COMPREHENSIVE METABOLIC PANEL
ALBUMIN: 3.4 g/dL — AB (ref 3.5–5.0)
ALK PHOS: 209 U/L — AB (ref 38–126)
ALT: 408 U/L — AB (ref 14–54)
ANION GAP: 7 (ref 5–15)
AST: 502 U/L — ABNORMAL HIGH (ref 15–41)
BILIRUBIN TOTAL: 3.5 mg/dL — AB (ref 0.3–1.2)
BUN: 9 mg/dL (ref 6–20)
CALCIUM: 8.9 mg/dL (ref 8.9–10.3)
CO2: 26 mmol/L (ref 22–32)
CREATININE: 0.71 mg/dL (ref 0.44–1.00)
Chloride: 107 mmol/L (ref 101–111)
GFR calc non Af Amer: >60 mL/min (ref >60)
GLUCOSE: 91 mg/dL (ref 65–99)
Potassium: 3.8 mmol/L (ref 3.5–5.1)
Sodium: 140 mmol/L (ref 135–145)
TOTAL PROTEIN: 6.7 g/dL (ref 6.5–8.1)

## 2017-06-05 LAB — CBC
HCT: 38.7 % (ref 36.0–46.0)
HEMOGLOBIN: 12.5 g/dL (ref 12.0–15.0)
MCH: 26 pg (ref 26.0–34.0)
MCHC: 32.3 g/dL (ref 30.0–36.0)
MCV: 80.6 fL (ref 78.0–100.0)
PLATELETS: 281 10*3/uL (ref 150–400)
RBC: 4.8 MIL/uL (ref 3.87–5.11)
RDW: 14.4 % (ref 11.5–15.5)
WBC: 6 10*3/uL (ref 4.0–10.5)

## 2017-06-05 LAB — TSH: TSH: 0.619 u[IU]/mL (ref 0.350–4.500)

## 2017-06-05 NOTE — Progress Notes (Signed)
Medication COMPLETE NATAL DHA 29-1-200 & 250 MG MISC 1 tablet not available.  Patient instructed to have medication brought from home.

## 2017-06-05 NOTE — Progress Notes (Signed)
PROGRESS NOTE    Patricia [name]  ZOX:[id number] DOB: 26-Aug-1991 DOA: 06/04/2017 PCP: Benita Stabile, MD    Brief Narrative:  26 year old female who recently had a vaginal delivery in 03/2017, currently breast-feeding, presents to the hospital with abdominal pain and elevated liver function tests. Workup has revealed cholelithiasis and passage of stone is likely the cause of her elevated liver enzymes. She is being followed by GI and general surgery. MRCP is planned for a.m. She will likely need cholecystectomy.   Assessment & Plan:   Principal Problem:   Abdominal pain Active Problems:   PCO (polycystic ovaries)   History of gestational hypertension   Biliary colic   Gall stones, common bile duct   1. Abdominal pain. Suspect is related to biliary colic. Overall her symptoms have improved since admission. Continue supportive management. 2. Transaminitis. Suspect this is related to cholelithiasis and probably passage of stone through Bile duct. Liver enzymes remain elevated. She will need a cholecystectomy. GI and general surgery following . MRCP planned for a.m. Repeat labs in a.m. Hepatitis panel has also been sent. She's been nothing by mouth after midnight. 3. Gestational hypertension. Blood pressure is currently stable.   DVT prophylaxis: early ambulation/SCDs Code Status: Full code Family Communication: No family present Disposition Plan: Discharge home once improved   Consultants:   Gastroenterology  General surgery  Procedures:     Antimicrobials:      Subjective: Continues to have pain and right upper quadrant. No vomiting.  Objective: Vitals:   06/04/17 1830 06/04/17 2119 06/05/17 0602 06/05/17 1500  BP: 122/76 110/70 136/82 130/78  Pulse:  73 69 63  Resp:  18 18 18   Temp:  98 F (36.7 C) 98.2 F (36.8 C) 97.8 F (36.6 C)  TempSrc:  Oral Oral Oral  SpO2: 100% 100% 100% 100%  Weight:  114.7 kg (252 lb 13.2 oz)    Height:  5\' 5"  (1.651 m)       Intake/Output Summary (Last 24 hours) at 06/05/17 1603 Last data filed at 06/05/17 0900  Gross per 24 hour  Intake          2539.58 ml  Output                0 ml  Net          2539.58 ml   Filed Weights   06/04/17 1526 06/04/17 2119  Weight: 114.8 kg (253 lb) 114.7 kg (252 lb 13.2 oz)    Examination:  General exam: Appears calm and comfortable  Respiratory system: Clear to auscultation. Respiratory effort normal. Cardiovascular system: S1 & S2 heard, RRR. No JVD, murmurs, rubs, gallops or clicks. No pedal edema. Gastrointestinal system: Abdomen is nondistended, soft tender in RUQ. No organomegaly or masses felt. Normal bowel sounds heard. Central nervous system: Alert and oriented. No focal neurological deficits. Extremities: Symmetric 5 x 5 power. Skin: No rashes, lesions or ulcers Psychiatry: Judgement and insight appear normal. Mood & affect appropriate.     Data Reviewed: I have personally reviewed following labs and imaging studies  CBC:  Recent Labs Lab 06/04/17 1539 06/04/17 1619 06/05/17 0547  WBC 10.0  --  6.0  NEUTROABS  --  7.4  --   HGB 13.4  --  12.5  HCT 40.8  --  38.7  MCV 79.5  --  80.6  PLT 316  --  281   Basic Metabolic Panel:  Recent Labs Lab 06/04/17 1539 06/05/17 0547  NA 140 140  K 4.1 3.8  CL 104 107  CO2 28 26  GLUCOSE 111* 91  BUN 12 9  CREATININE 0.75 0.71  CALCIUM 9.0 8.9   GFR: Estimated Creatinine Clearance: 134.8 mL/min (by C-G formula based on SCr of 0.71 mg/dL). Liver Function Tests:  Recent Labs Lab 06/04/17 1539 06/04/17 1832 06/05/17 0547  AST 546*  --  502*  ALT 306*  --  408*  ALKPHOS 173*  --  209*  BILITOT 2.4* 2.7* 3.5*  PROT 7.6  --  6.7  ALBUMIN 3.9  --  3.4*    Recent Labs Lab 06/04/17 1539  LIPASE 27   No results for input(s): AMMONIA in the last 168 hours. Coagulation Profile: No results for input(s): INR, PROTIME in the last 168 hours. Cardiac Enzymes: No results for input(s):  CKTOTAL, CKMB, CKMBINDEX, TROPONINI in the last 168 hours. BNP (last 3 results) No results for input(s): PROBNP in the last 8760 hours. HbA1C: No results for input(s): HGBA1C in the last 72 hours. CBG: No results for input(s): GLUCAP in the last 168 hours. Lipid Profile: No results for input(s): CHOL, HDL, LDLCALC, TRIG, CHOLHDL, LDLDIRECT in the last 72 hours. Thyroid Function Tests:  Recent Labs  06/05/17 0547  TSH 0.619   Anemia Panel: No results for input(s): VITAMINB12, FOLATE, FERRITIN, TIBC, IRON, RETICCTPCT in the last 72 hours. Sepsis Labs: No results for input(s): PROCALCITON, LATICACIDVEN in the last 168 hours.  No results found for this or any previous visit (from the past 240 hour(s)).       Radiology Studies: Ct Abdomen Pelvis W Contrast  Result Date: 06/04/2017 CLINICAL DATA:  Upper abdominal pain.  Elevated LFTs. EXAM: CT ABDOMEN AND PELVIS WITH CONTRAST TECHNIQUE: Multidetector CT imaging of the abdomen and pelvis was performed using the standard protocol following bolus administration of intravenous contrast. CONTRAST:  ISOVUE-300 IOPAMIDOL (ISOVUE-300) INJECTION 61% COMPARISON:  CT abdomen/pelvis 13 hours prior at [facility]. FINDINGS: Lower chest: The lung bases are clear. No pleural fluid or consolidation. Hepatobiliary: The liver is prominent size spanning 20 cm craniocaudal. No focal hepatic lesion. There is minimal periportal edema. Gallbladder physiologically distended, no calcified stone. No biliary dilatation. Pancreas: No ductal dilatation or inflammation. Spleen: Normal in size without focal abnormality. Adrenals/Urinary Tract: Normal adrenal glands. Excreted contrast in both renal collecting systems likely residual from CT earlier this day. Homogeneous enhancement. No hydronephrosis or perinephric edema. Physiologically distended. Excreted intravenous contrast from prior CT. No bladder wall thickening. Stomach/Bowel: Stomach  physiologically distended. No bowel obstruction or inflammation. Enteric contrast from prior CT is seen throughout the colon. Normal appendix. Vascular/Lymphatic: No significant vascular findings are present. No enlarged abdominal or pelvic lymph nodes. Reproductive: Uterus and bilateral adnexa are unremarkable. Other: No free air, free fluid, or intra-abdominal fluid collection. Musculoskeletal: There are no acute or suspicious osseous abnormalities. IMPRESSION: 1. Borderline mild hepatomegaly. Mild periportal edema, may be due to hydration status. Given elevated LFTs, hepatitis could produce a similar appearance. 2. Excreted intravenous contrast in the renal collecting systems and urinary bladder from CT earlier this day. Electronically Signed   By: Rubye Oaks M.D.   On: 06/04/2017 17:42   US Abdomen Limited Ruq  Result Date: 06/05/2017 CLINICAL DATA:  Abdominal pain EXAM: ULTRASOUND ABDOMEN LIMITED RIGHT UPPER QUADRANT COMPARISON:  CT 06/04/2017 FINDINGS: Gallbladder: Layering small stones within the gallbladder. No wall thickening. Negative sonographic Murphy's. Common bile duct: Diameter: Upper limits normal at 7 mm Liver: No focal lesion identified. Within normal limits  in parenchymal echogenicity. IMPRESSION: Cholelithiasis.  No sonographic evidence of acute cholecystitis. Electronically Signed   By: Charlett NoseKevin  Dover M.D.   On: 06/05/2017 10:28        Scheduled Meds: . prenatal multivitamin  1 tablet Oral QPM   Continuous Infusions: . dextrose 5 % and 0.9% NaCl 125 mL/hr at 06/05/17 1503     LOS: 1 day    Time spent: 25mins    MEMON,JEHANZEB, MD [facility] Pager [phone]  If 7PM-7AM, please contact night-coverage [email or web address] Password [email or web address] 06/05/2017, 4:03 PM

## 2017-06-05 NOTE — Consult Note (Signed)
Reason for Consult: Abdominal Pain Referring Physician: Venicia Vandall is an 26 y.o. female.  HPI: 26 y/o female admitted to the hospital service overnight for 24 hours of ongoing abdominal pain.  She states that she has been having post prandial RUQ pain most nights for nearly 4 weeks.  She had one previous episode after her first delivery but not until four weeks ago.  This last episode was constant with associated nausea and vomiting.  She initially went to an outside facility yesterday morning and underwent laboratory studies and results were negative so they discharged her home.  Only for her to return 12 hour later to [facility].  She then underwent repeat labs and CT A/P.  She now was noted to have elevated ALT/AST as well as TBili.  Ct demonstrates a dilated gallbladder but no identifiable stone.  I've been asked to evaluate for surgical recommendations  Past Medical History:  Diagnosis Date  . Asthma   . Cough 08/01/2015  . Migraine   . Migraines   . Nausea and vomiting during pregnancy prior to [redacted] weeks gestation 08/01/2015  . PCO (polycystic ovaries) 03/08/2014  . Polycystic disease, ovaries   . Pregnancy induced hypertension   . Pregnant 05/27/2015  . URI (upper respiratory infection) 08/01/2015    Past Surgical History:  Procedure Laterality Date  . TONSILLECTOMY    . WISDOM TOOTH EXTRACTION      Family History  Problem Relation Age of Onset  . Migraines Sister   . Hypertension Brother   . Heart disease Maternal Grandmother   . Cancer Maternal Grandfather        prostate  . Stroke Paternal Grandmother   . Cancer Paternal Grandfather        prostate  . Stroke Paternal Grandfather   . Atrial fibrillation Father     Social History:  reports that she has never smoked. She has never used smokeless tobacco. She reports that she does not drink alcohol or use drugs.  Allergies:  Allergies  Allergen Reactions  . Peanut-Containing Drug Products Anaphylaxis and Hives   . Propranolol Shortness Of Breath  . Cephalexin Hives  . Imitrex [Sumatriptan] Hives    Medications: I have reviewed the patient's current medications.  Results for orders placed or performed during the hospital encounter of 06/04/17 (from the past 48 hour(s))  Urinalysis, Routine w reflex microscopic     Status: Abnormal   Collection Time: 06/04/17  3:29 PM  Result Value Ref Range   Color, Urine AMBER (A) YELLOW    Comment: BIOCHEMICALS MAY BE AFFECTED BY COLOR   APPearance CLEAR CLEAR   Specific Gravity, Urine 1.039 (H) 1.005 - 1.030   pH 6.0 5.0 - 8.0   Glucose, UA NEGATIVE NEGATIVE mg/dL   Hgb urine dipstick SMALL (A) NEGATIVE   Bilirubin Urine SMALL (A) NEGATIVE   Ketones, ur NEGATIVE NEGATIVE mg/dL   Protein, ur NEGATIVE NEGATIVE mg/dL   Nitrite NEGATIVE NEGATIVE   Leukocytes, UA TRACE (A) NEGATIVE   RBC / HPF 0-5 0 - 5 RBC/hpf   WBC, UA 0-5 0 - 5 WBC/hpf   Bacteria, UA NONE SEEN NONE SEEN   Squamous Epithelial / LPF 0-5 (A) NONE SEEN   Mucous PRESENT   Pregnancy, urine     Status: None   Collection Time: 06/04/17  3:29 PM  Result Value Ref Range   Preg Test, Ur NEGATIVE NEGATIVE    Comment:        THE  SENSITIVITY OF THIS METHODOLOGY IS >20 mIU/mL.   Lipase, blood     Status: None   Collection Time: 06/04/17  3:39 PM  Result Value Ref Range   Lipase 27 11 - 51 U/L  Comprehensive metabolic panel     Status: Abnormal   Collection Time: 06/04/17  3:39 PM  Result Value Ref Range   Sodium 140 135 - 145 mmol/L   Potassium 4.1 3.5 - 5.1 mmol/L   Chloride 104 101 - 111 mmol/L   CO2 28 22 - 32 mmol/L   Glucose, Bld 111 (H) 65 - 99 mg/dL   BUN 12 6 - 20 mg/dL   Creatinine, Ser 0.75 0.44 - 1.00 mg/dL   Calcium 9.0 8.9 - 10.3 mg/dL   Total Protein 7.6 6.5 - 8.1 g/dL   Albumin 3.9 3.5 - 5.0 g/dL   AST 546 (H) 15 - 41 U/L   ALT 306 (H) 14 - 54 U/L   Alkaline Phosphatase 173 (H) 38 - 126 U/L   Total Bilirubin 2.4 (H) 0.3 - 1.2 mg/dL   GFR calc non Af Amer >60 >60  mL/min   GFR calc Af Amer >60 >60 mL/min    Comment: (NOTE) The eGFR has been calculated using the CKD EPI equation. This calculation has not been validated in all clinical situations. eGFR's persistently <60 mL/min signify possible Chronic Kidney Disease.    Anion gap 8 5 - 15  CBC     Status: Abnormal   Collection Time: 06/04/17  3:39 PM  Result Value Ref Range   WBC 10.0 4.0 - 10.5 K/uL   RBC 5.13 (H) 3.87 - 5.11 MIL/uL   Hemoglobin 13.4 12.0 - 15.0 g/dL   HCT 40.8 36.0 - 46.0 %   MCV 79.5 78.0 - 100.0 fL   MCH 26.1 26.0 - 34.0 pg   MCHC 32.8 30.0 - 36.0 g/dL   RDW 14.3 11.5 - 15.5 %   Platelets 316 150 - 400 K/uL  Differential     Status: None   Collection Time: 06/04/17  4:19 PM  Result Value Ref Range   Neutrophils Relative % 77 %   Neutro Abs 7.4 1.7 - 7.7 K/uL   Lymphocytes Relative 17 %   Lymphs Abs 1.6 0.7 - 4.0 K/uL   Monocytes Relative 6 %   Monocytes Absolute 0.6 0.1 - 1.0 K/uL   Eosinophils Relative 0 %   Eosinophils Absolute 0.0 0.0 - 0.7 K/uL   Basophils Relative 0 %   Basophils Absolute 0.0 0.0 - 0.1 K/uL  Bilirubin, fractionated(tot/dir/indir)     Status: Abnormal   Collection Time: 06/04/17  6:32 PM  Result Value Ref Range   Total Bilirubin 2.7 (H) 0.3 - 1.2 mg/dL   Bilirubin, Direct 1.5 (H) 0.1 - 0.5 mg/dL   Indirect Bilirubin 1.2 (H) 0.3 - 0.9 mg/dL  TSH     Status: None   Collection Time: 06/05/17  5:47 AM  Result Value Ref Range   TSH 0.619 0.350 - 4.500 uIU/mL    Comment: Performed by a 3rd Generation assay with a functional sensitivity of <=0.01 uIU/mL.  Comprehensive metabolic panel     Status: Abnormal   Collection Time: 06/05/17  5:47 AM  Result Value Ref Range   Sodium 140 135 - 145 mmol/L   Potassium 3.8 3.5 - 5.1 mmol/L   Chloride 107 101 - 111 mmol/L   CO2 26 22 - 32 mmol/L   Glucose, Bld 91 65 - 99 mg/dL  BUN 9 6 - 20 mg/dL   Creatinine, Ser 0.71 0.44 - 1.00 mg/dL   Calcium 8.9 8.9 - 10.3 mg/dL   Total Protein 6.7 6.5 - 8.1  g/dL   Albumin 3.4 (L) 3.5 - 5.0 g/dL   AST 502 (H) 15 - 41 U/L   ALT 408 (H) 14 - 54 U/L   Alkaline Phosphatase 209 (H) 38 - 126 U/L   Total Bilirubin 3.5 (H) 0.3 - 1.2 mg/dL   GFR calc non Af Amer >60 >60 mL/min   GFR calc Af Amer >60 >60 mL/min    Comment: (NOTE) The eGFR has been calculated using the CKD EPI equation. This calculation has not been validated in all clinical situations. eGFR's persistently <60 mL/min signify possible Chronic Kidney Disease.    Anion gap 7 5 - 15  CBC     Status: None   Collection Time: 06/05/17  5:47 AM  Result Value Ref Range   WBC 6.0 4.0 - 10.5 K/uL   RBC 4.80 3.87 - 5.11 MIL/uL   Hemoglobin 12.5 12.0 - 15.0 g/dL   HCT 38.7 36.0 - 46.0 %   MCV 80.6 78.0 - 100.0 fL   MCH 26.0 26.0 - 34.0 pg   MCHC 32.3 30.0 - 36.0 g/dL   RDW 14.4 11.5 - 15.5 %   Platelets 281 150 - 400 K/uL    Ct Abdomen Pelvis W Contrast  Result Date: 06/04/2017 CLINICAL DATA:  Upper abdominal pain.  Elevated LFTs. EXAM: CT ABDOMEN AND PELVIS WITH CONTRAST TECHNIQUE: Multidetector CT imaging of the abdomen and pelvis was performed using the standard protocol following bolus administration of intravenous contrast. CONTRAST:  153m ISOVUE-300 IOPAMIDOL (ISOVUE-300) INJECTION 61% COMPARISON:  [place] [facility] [place] [facility] [place] chest: The lung bases are clear. No pleural fluid or consolidation. Hepatobiliary: The liver is prominent size spanning 20 cm craniocaudal. No focal hepatic lesion. There is minimal periportal edema. Gallbladder physiologically distended, no calcified stone. No biliary dilatation. Pancreas: No ductal dilatation or inflammation. Spleen: Normal in size without focal abnormality. Adrenals/Urinary Tract: Normal adrenal glands. Excreted contrast in both renal collecting systems likely residual from CT earlier this day. Homogeneous enhancement. No hydronephrosis or perinephric edema. Physiologically distended. Excreted  intravenous contrast from prior CT. No bladder wall thickening. Stomach/Bowel: Stomach physiologically distended. No bowel obstruction or inflammation. Enteric contrast from prior CT is seen throughout the colon. Normal appendix. Vascular/Lymphatic: No significant vascular findings are present. No enlarged abdominal or pelvic lymph nodes. Reproductive: Uterus and bilateral adnexa are unremarkable. Other: No free air, free fluid, or intra-abdominal fluid collection. Musculoskeletal: There are no acute or suspicious osseous abnormalities. IMPRESSION: 1. Borderline mild hepatomegaly. Mild periportal edema, may be due to hydration status. Given elevated LFTs, hepatitis could produce a similar appearance. 2. Excreted intravenous contrast in the renal collecting systems and urinary bladder from CT earlier this day. Electronically Signed   By: MJeb LeveringM.D.   On: 06/04/2017 17:42    Review of Systems  All other systems reviewed and are negative.   10 point ROS is negative except otherwise noted in HPI  Blood pressure 136/82, pulse 69, temperature 98.2 F (36.8 C), temperature source Oral, resp. rate 18, height 5' 5" (1.651 m), weight 252 lb 13.2 oz (114.7 kg), last menstrual period 05/28/2017, SpO2 100 %, currently breastfeeding. Physical Exam  Constitutional: She is oriented to person, place, and time. She appears well-developed and well-nourished.  HENT:  Head: Normocephalic and atraumatic.  Eyes: Conjunctivae and EOM are normal. No scleral icterus.  Neck: Normal range of motion. Neck supple. No tracheal deviation present. No thyromegaly present.  Cardiovascular: Normal rate and intact distal pulses.   Respiratory: Effort normal and breath sounds normal. No respiratory distress.  GI: Soft. She exhibits no distension. There is tenderness. There is no rebound and no guarding.  Mild RUQ tenderness on deep palpation  Musculoskeletal: Normal range of motion. She exhibits no deformity.   Neurological: She is alert and oriented to person, place, and time. No cranial nerve deficit.  Skin: Skin is warm and dry.  Psychiatric: She has a normal mood and affect. Her behavior is normal. Judgment and thought content normal.   Physical Exam  Constitutional: She is oriented to person, place, and time. She appears well-developed and well-nourished.  HENT:  Head: Normocephalic and atraumatic.  Eyes: Conjunctivae and EOM are normal. No scleral icterus.  Neck: Normal range of motion. Neck supple. No tracheal deviation present. No thyromegaly present.  Cardiovascular: Normal rate and intact distal pulses.   Pulmonary/Chest: Effort normal and breath sounds normal. No respiratory distress.  Abdominal: Soft. She exhibits no distension. There is tenderness. There is no rebound and no guarding.  Mild RUQ tenderness on deep palpation  Musculoskeletal: Normal range of motion. She exhibits no deformity.  Neurological: She is alert and oriented to person, place, and time. No cranial nerve deficit.  Skin: Skin is warm and dry.  Psychiatric: She has a normal mood and affect. Her behavior is normal. Judgment and thought content normal.    Assessment/Plan: 26 y/o female with possible choledocholithiasis.  Her exam, history and labs suggest a CBD stone as her Bili continues to rise this am.  Anticipate she needs an ERCP prior to surgery.  [place] and GI evaluation pending.  Dr. Arnoldo Morale will be seeing the patient in the am and I have notified the patient as well.    Andres Labrum 06/05/2017, 9:05 AM

## 2017-06-05 NOTE — Consult Note (Signed)
Referring Provider: No ref. provider found Primary Care Physician:  Benita Stabile, MD Primary Gastroenterologist:  Dr.Kaila Devries  Reason for Consultation:  Right upper quadrant abdominal pain. Increased LFTs.  HPI: 26 year old lady with a month history of intermittent right upper quadrant abdominal pain becoming became more severe over the last 48 hours. Presented to [facility]. Evaluation there including CT. Apparently discharged without a definitive diagnosis. She presented to [facility] within the subsequent 24 hours. Further evaluation revealed a significantly elevated ALT AST and total bilirubin. She had a second CT within 24 hours revealing only a slightly distended gallbladder no biliary dilation or other right upper quadrant abnormality. Overnight her LFTs have modestly improved although her bilirubin ticked up a bit. Abdominal pain has subsided. Ultrasound this morning demonstrates cholelithiasis and a common bile duct diameter 7 mm. No overt signs of cholecystitis.  Normal vaginal delivery 2 months ago. It was reported to me by Dr. Clarene Duke the patient was told her LFTs were completely normal at [facility]. Patient tells me this morning she wasn't told of "anything" about labs in [place], [place].  Patient denies nausea, vomiting hematemesis, melena or hematochezia. Has had some diarrhea recently. Good during pregnancy-resolved. No dysphagia. No family history of chronic GI/liver disease or malignancy.  No nonsteroidal agents, alcohol or illicit drugs.  Past Medical History:  Diagnosis Date  . Asthma   . Cough 08/01/2015  . Migraine   . Migraines   . Nausea and vomiting during pregnancy prior to [redacted] weeks gestation 08/01/2015  . PCO (polycystic ovaries) 03/08/2014  . Polycystic disease, ovaries   . Pregnancy induced hypertension   . Pregnant 05/27/2015  . URI (upper respiratory infection) 08/01/2015    Past Surgical History:  Procedure Laterality Date  . TONSILLECTOMY    . WISDOM  TOOTH EXTRACTION      Prior to Admission medications   Medication Sig Start Date End Date Taking? Authorizing Provider  albuterol (PROVENTIL HFA;VENTOLIN HFA) 108 (90 Base) MCG/ACT inhaler Inhale 2 puffs into the lungs every 4 (four) hours as needed for wheezing or shortness of breath. 05/27/17  Yes Eber Hong, MD  Biotin 5000 MCG CAPS Take 1 capsule by mouth daily.   Yes [provider]  butalbital-acetaminophen-caffeine (FIORICET, ESGIC) 50-325-40 MG tablet TAKE ONE TO TWO TABLETS BY MOUTH EVERY 6 HOURS AS NEEDED FOR HEADACHES 05/23/17  Yes [provider]  etonogestrel (NEXPLANON) 68 MG IMPL implant 1 each by Subdermal route once.   Yes [provider]  loratadine (CLARITIN) 10 MG tablet Take 10 mg by mouth daily as needed for allergies.   Yes [provider]  Prenat-FeBis-FePro-FA-CA-Omega (COMPLETE NATAL DHA) 29-1-200 & 250 MG MISC Take 1 tablet by mouth every evening.   Yes [provider]  PREVIDENT 5000 BOOSTER PLUS 1.1 % PSTE Apply topically daily.  05/23/17   [provider]    Current Facility-Administered Medications  Medication Dose Route Frequency Provider Last Rate Last Dose  . dextrose 5 %-0.9 % sodium chloride infusion   Intravenous Continuous Houston Siren, MD 125 mL/hr at 06/04/17 2153    . morphine 2 MG/ML injection 2 mg  2 mg Intravenous Q2H PRN Houston Siren, MD   2 mg at 06/05/17 0929  . ondansetron (ZOFRAN) injection 4 mg  4 mg Intravenous Q6H PRN Houston Siren, MD      . prenatal multivitamin tablet 1 tablet  1 tablet Oral QPM Houston Siren, MD        Allergies  as of 06/04/2017 - Review Complete 06/04/2017  Allergen Reaction Noted  . Peanut-containing drug products Anaphylaxis and Hives 04/27/2012  . Propranolol Shortness Of Breath 09/23/2014  . Cephalexin Hives 12/22/2011  . Imitrex [sumatriptan] Hives 10/12/2013    Family History  Problem Relation Age of Onset  . Migraines Sister   . Hypertension Brother   . Heart disease  Maternal Grandmother   . Cancer Maternal Grandfather        prostate  . Stroke Paternal Grandmother   . Cancer Paternal Grandfather        prostate  . Stroke Paternal Grandfather   . Atrial fibrillation Father     Social History   Social History  . Marital status: Single    Spouse name: N/A  . Number of children: N/A  . Years of education: N/A   Occupational History  . Not on file.   Social History Main Topics  . Smoking status: Never Smoker  . Smokeless tobacco: Never Used  . Alcohol use No     Comment: occassional  . Drug use: No  . Sexual activity: Not Currently    Birth control/ protection: None   Other Topics Concern  . Not on file   Social History Narrative  . No narrative on file    Review of Systems:   Physical Exam: Vital signs in last 24 hours: Temp:  [98 F (36.7 C)-99.2 F (37.3 C)] 98.2 F (36.8 C) (07/15 0602) Pulse Rate:  [54-73] 69 (07/15 0602) Resp:  [18] 18 (07/15 0602) BP: (110-149)/(70-93) 136/82 (07/15 0602) SpO2:  [97 %-100 %] 100 % (07/15 0602) Weight:  [252 lb 13.2 oz (114.7 kg)-253 lb (114.8 kg)] 252 lb 13.2 oz (114.7 kg) (07/14 2119)   General:   Alert,   pleasant and cooperative in NAD Head:  Normocephalic and atraumatic. Lungs:  Clear throughout to auscultation.   No wheezes, crackles, or rhonchi. No acute distress. Heart:  Regular rate and rhythm; no murmurs, clicks, rubs,  or gallops. Abdomen: Nondistended. Positive bowel sounds. Soft with minimal right upper quadrant tenderness to palpation. No appreciable mass or gadolinium megaly. Intake/Output from previous day: 07/14 0701 - 07/15 0700 In: 2539.6 [I.V.:639.6; IV Piggyback:1900] Out: -  Intake/Output this shift: No intake/output data recorded.  Lab Results:  Recent Labs  06/04/17 1539 06/05/17 0547  WBC 10.0 6.0  HGB 13.4 12.5  HCT 40.8 38.7  PLT 316 281   BMET  Recent Labs  06/04/17 1539 06/05/17 0547  NA 140 140  K 4.1 3.8  CL 104 107  CO2 28 26    GLUCOSE 111* 91  BUN 12 9  CREATININE 0.75 0.71  CALCIUM 9.0 8.9   LFT  Recent Labs  06/04/17 1832 06/05/17 0547  PROT  --  6.7  ALBUMIN  --  3.4*  AST  --  502*  ALT  --  408*  ALKPHOS  --  209*  BILITOT 2.7* 3.5*  BILIDIR 1.5*  --   IBILI 1.2*  --     Impression: Pleasant 26 year old lady presents with some right upper quadrant abdominal pain and elevated LFTs. She has cholelithiasis without overt evidence of cholecystitis or biliary dilation.  Almost certainly, patient has recently passed a gallstone into her common duct. Transaminases have improved modestly;  bilirubin has ticked up a bit likely as result of a "Lag" phenomenon.  Scenario not consistent with luminal pathology in her upper GI tract such as reflux, gastritis or peptic ulcer disease. Recent cross-sectional imaging  is reassuring in that no other pathology is appreciated which could be contributing to her presentation.  She may or may not have persisted indwelling common bile duct stone. Clinically, does not have cholangitis.  Recommendations:  Patient needs a cholecystectomy. Prior to surgery, we'll go ahead and pursue an MRCP to assess for occult common duct stones.   Let's follow LFTs.  No indication for ERCP at the moment.  Further recommendations to follow.          Notice:  This dictation was prepared with Dragon dictation along with smaller phrase technology. Any transcriptional errors that result from this process are unintentional and may not be corrected upon review.

## 2017-06-06 ENCOUNTER — Inpatient Hospital Stay (HOSPITAL_COMMUNITY): Payer: 59

## 2017-06-06 ENCOUNTER — Telehealth: Payer: Self-pay | Admitting: Nurse Practitioner

## 2017-06-06 ENCOUNTER — Encounter: Payer: Self-pay | Admitting: Nurse Practitioner

## 2017-06-06 DIAGNOSIS — R112 Nausea with vomiting, unspecified: Secondary | ICD-10-CM

## 2017-06-06 DIAGNOSIS — R7401 Elevation of levels of liver transaminase levels: Secondary | ICD-10-CM

## 2017-06-06 DIAGNOSIS — R945 Abnormal results of liver function studies: Principal | ICD-10-CM

## 2017-06-06 DIAGNOSIS — R197 Diarrhea, unspecified: Secondary | ICD-10-CM

## 2017-06-06 DIAGNOSIS — R7989 Other specified abnormal findings of blood chemistry: Secondary | ICD-10-CM

## 2017-06-06 DIAGNOSIS — R1011 Right upper quadrant pain: Secondary | ICD-10-CM

## 2017-06-06 DIAGNOSIS — R740 Nonspecific elevation of levels of transaminase and lactic acid dehydrogenase [LDH]: Secondary | ICD-10-CM

## 2017-06-06 LAB — HEPATIC FUNCTION PANEL
ALBUMIN: 3.5 g/dL (ref 3.5–5.0)
ALT: 308 U/L — ABNORMAL HIGH (ref 14–54)
AST: 170 U/L — AB (ref 15–41)
Alkaline Phosphatase: 226 U/L — ABNORMAL HIGH (ref 38–126)
BILIRUBIN TOTAL: 1.5 mg/dL — AB (ref 0.3–1.2)
Bilirubin, Direct: 0.4 mg/dL (ref 0.1–0.5)
Indirect Bilirubin: 1.1 mg/dL — ABNORMAL HIGH (ref 0.3–0.9)
TOTAL PROTEIN: 6.8 g/dL (ref 6.5–8.1)

## 2017-06-06 MED ORDER — GADOBENATE DIMEGLUMINE 529 MG/ML IV SOLN
20.0000 mL | Freq: Once | INTRAVENOUS | Status: AC | PRN
  Administered 2017-06-06: 20 mL via INTRAVENOUS

## 2017-06-06 NOTE — Telephone Encounter (Signed)
Please call patient Friday morning 7/20) and remind her to have her labs checked on 7/20 (hospital followup for elevated LFTs/likely passed stone). Thanks

## 2017-06-06 NOTE — Progress Notes (Signed)
Initial Nutrition Assessment  DOCUMENTATION CODES:  Obesity     INTERVENTION:  Heart Healthy diet  Milk with all meals   NUTRITION DIAGNOSIS:   Altered GI function related to acute illness (abdominal pain, elevated LFT's) as evidenced by per patient/family report.   GOAL:   Patient will meet greater than or equal to 90% of their needs   MONITOR:   PO intake, Labs, Weight trends  REASON FOR ASSESSMENT:   Malnutrition Screening Tool    ASSESSMENT:   Patient is a 26 yo who had a baby boy in May. She presents with abdominal pain which has improved. Her diet has been fully advanced and she is antcipatiing her lunch.  Possible cholestectomy as outpatient.  Her appetite is good and intake reported as consistent up until onset of abdominal pain.   The patient wt hx show loss which she associates with her pregnancy and considered desireable. She has returned to pre-pregnancy wt and expects to continue to decrease wt associated with breast-feeding. Dr. April MansonYalcinkaya has set a goal wt for her of 200# (90.7 kg.  Recent Labs Lab 06/04/17 1539 06/05/17 0547  NA 140 140  K 4.1 3.8  CL 104 107  CO2 28 26  BUN 12 9  CREATININE 0.75 0.71  CALCIUM 9.0 8.9  GLUCOSE 111* 91   Labs and meds reviewed.  Nutrition-Focused physical exam completed. Findings are no fat depletion, no muscle depletion, and no edema. The patient does not meet criteria for malnutrition.     Diet Order:  Diet Heart Room service appropriate? Yes; Fluid consistency: Thin  Skin:  Reviewed, no issues  Last BM:  unknown   Height:   Ht Readings from Last 1 Encounters:  06/04/17 5\' 5"  (1.651 m)    Weight:   Wt Readings from Last 1 Encounters:  06/04/17 252 lb 13.2 oz (114.7 kg)    Ideal Body Weight:  57 kg  BMI:  Body mass index is 42.07 kg/m.  Estimated Nutritional Needs:   Kcal:   [id number]  Protein:  115-125 gr  Fluid:  >2.0 liters per day  EDUCATION NEEDS:   Education needs  addressed  Royann ShiversLynn Francia Verry MS,RD,CSG,LDN Office: [phone] Pager: [phone]

## 2017-06-06 NOTE — Progress Notes (Signed)
PROGRESS NOTE    Patricia [name]  WUJ:[id number] DOB: January 21, 1991 DOA: 06/04/2017 PCP: Benita Stabile, MD    Brief Narrative:  26 year old female who recently had a vaginal delivery in 03/2017, currently breast-feeding, presents to the hospital with abdominal pain and elevated liver function tests. Workup has revealed cholelithiasis and passage of stone is likely the cause of her elevated liver enzymes. MRCP did not show any retained stone in bile duct . General surgery planning on cholecystectomy 7/18   Assessment & Plan:   Principal Problem:   Abdominal pain Active Problems:   PCO (polycystic ovaries)   Obesity, Class III, BMI 40-49.9 (morbid obesity) (HCC)   History of gestational hypertension   Biliary colic   Gall stones, common bile duct   Nausea vomiting and diarrhea   RUQ abdominal pain   Transaminitis   1. Abdominal pain. Suspect is related to biliary colic. Overall her symptoms have improved since admission. Continue supportive management. 2. Transaminitis. Suspect this is related to cholelithiasis and probably passage of stone through Bile duct. Liver enzymes are trending down. She will need a cholecystectomy. GI and general surgery following . MRCP done today did not show any retained stone in the bile duct. Repeat labs in a.m. general surgery planning on cholecystectomy 7/18. 3. Gestational hypertension. Blood pressure is currently stable. 4. Morbid obesity. Counseled on the importance of diet and exercise.   DVT prophylaxis: early ambulation/SCDs Code Status: Full code Family Communication: No family present Disposition Plan: Discharge home after cholecystectomy   Consultants:   Gastroenterology  General surgery  Procedures:     Antimicrobials:      Subjective: Feeling better today. Abdominal pain has resolved.  Objective: Vitals:   06/05/17 0602 06/05/17 1500 06/05/17 2129 06/06/17 0637  BP: 136/82 130/78 121/81 132/82  Pulse: 69 63 74 68  Resp:  18 18 18 18   Temp: 98.2 F (36.8 C) 97.8 F (36.6 C) 97.8 F (36.6 C) 98 F (36.7 C)  TempSrc: Oral Oral Oral Oral  SpO2: 100% 100% 100% 99%  Weight:      Height:        Intake/Output Summary (Last 24 hours) at 06/06/17 1527 Last data filed at 06/06/17 0951  Gross per 24 hour  Intake              720 ml  Output                0 ml  Net              720 ml   Filed Weights   06/04/17 1526 06/04/17 2119  Weight: 114.8 kg (253 lb) 114.7 kg (252 lb 13.2 oz)    Examination:  General exam: Alert, awake, oriented x 3 Respiratory system: Clear to auscultation. Respiratory effort normal. Cardiovascular system:RRR. No murmurs, rubs, gallops. Gastrointestinal system: Abdomen is nondistended, soft and nontender. No organomegaly or masses felt. Normal bowel sounds heard. Central nervous system: Alert and oriented. No focal neurological deficits. Extremities: No C/C/E, +pedal pulses Skin: No rashes, lesions or ulcers Psychiatry: Judgement and insight appear normal. Mood & affect appropriate.     Data Reviewed: I have personally reviewed following labs and imaging studies  CBC:  Recent Labs Lab 06/04/17 1539 06/04/17 1619 06/05/17 0547  WBC 10.0  --  6.0  NEUTROABS  --  7.4  --   HGB 13.4  --  12.5  HCT 40.8  --  38.7  MCV 79.5  --  80.6  PLT  316  --  281   Basic Metabolic Panel:  Recent Labs Lab 06/04/17 1539 06/05/17 0547  NA 140 140  K 4.1 3.8  CL 104 107  CO2 28 26  GLUCOSE 111* 91  BUN 12 9  CREATININE 0.75 0.71  CALCIUM 9.0 8.9   GFR: Estimated Creatinine Clearance: 134.8 mL/min (by C-G formula based on SCr of 0.71 mg/dL). Liver Function Tests:  Recent Labs Lab 06/04/17 1539 06/04/17 1832 06/05/17 0547 06/06/17 0716  AST 546*  --  502* 170*  ALT 306*  --  408* 308*  ALKPHOS 173*  --  209* 226*  BILITOT 2.4* 2.7* 3.5* 1.5*  PROT 7.6  --  6.7 6.8  ALBUMIN 3.9  --  3.4* 3.5    Recent Labs Lab 06/04/17 1539  LIPASE 27   No results for  input(s): AMMONIA in the last 168 hours. Coagulation Profile: No results for input(s): INR, PROTIME in the last 168 hours. Cardiac Enzymes: No results for input(s): CKTOTAL, CKMB, CKMBINDEX, TROPONINI in the last 168 hours. BNP (last 3 results) No results for input(s): PROBNP in the last 8760 hours. HbA1C: No results for input(s): HGBA1C in the last 72 hours. CBG: No results for input(s): GLUCAP in the last 168 hours. Lipid Profile: No results for input(s): CHOL, HDL, LDLCALC, TRIG, CHOLHDL, LDLDIRECT in the last 72 hours. Thyroid Function Tests:  Recent Labs  06/05/17 0547  TSH 0.619   Anemia Panel: No results for input(s): VITAMINB12, FOLATE, FERRITIN, TIBC, IRON, RETICCTPCT in the last 72 hours. Sepsis Labs: No results for input(s): PROCALCITON, LATICACIDVEN in the last 168 hours.  No results found for this or any previous visit (from the past 240 hour(s)).       Radiology Studies: Ct Abdomen Pelvis W Contrast  Result Date: 06/04/2017 CLINICAL DATA:  Upper abdominal pain.  Elevated LFTs. EXAM: CT ABDOMEN AND PELVIS WITH CONTRAST TECHNIQUE: Multidetector CT imaging of the abdomen and pelvis was performed using the standard protocol following bolus administration of intravenous contrast. CONTRAST:  ISOVUE-300 IOPAMIDOL (ISOVUE-300) INJECTION 61% COMPARISON:  CT abdomen/pelvis 13 hours prior at [facility]. FINDINGS: Lower chest: The lung bases are clear. No pleural fluid or consolidation. Hepatobiliary: The liver is prominent size spanning 20 cm craniocaudal. No focal hepatic lesion. There is minimal periportal edema. Gallbladder physiologically distended, no calcified stone. No biliary dilatation. Pancreas: No ductal dilatation or inflammation. Spleen: Normal in size without focal abnormality. Adrenals/Urinary Tract: Normal adrenal glands. Excreted contrast in both renal collecting systems likely residual from CT earlier this day. Homogeneous enhancement. No  hydronephrosis or perinephric edema. Physiologically distended. Excreted intravenous contrast from prior CT. No bladder wall thickening. Stomach/Bowel: Stomach physiologically distended. No bowel obstruction or inflammation. Enteric contrast from prior CT is seen throughout the colon. Normal appendix. Vascular/Lymphatic: No significant vascular findings are present. No enlarged abdominal or pelvic lymph nodes. Reproductive: Uterus and bilateral adnexa are unremarkable. Other: No free air, free fluid, or intra-abdominal fluid collection. Musculoskeletal: There are no acute or suspicious osseous abnormalities. IMPRESSION: 1. Borderline mild hepatomegaly. Mild periportal edema, may be due to hydration status. Given elevated LFTs, hepatitis could produce a similar appearance. 2. Excreted intravenous contrast in the renal collecting systems and urinary bladder from CT earlier this day. Electronically Signed   By: Rubye Oaks M.D.   On: 06/04/2017 17:42   Mr 3d Recon At Scanner  Result Date: 06/06/2017 CLINICAL DATA:  Upper abdominal pain for 3 days. Elevated liver function tests. Cholelithiasis. EXAM: MRI  ABDOMEN WITHOUT AND WITH CONTRAST (INCLUDING MRCP) TECHNIQUE: Multiplanar multisequence MR imaging of the abdomen was performed both before and after the administration of intravenous contrast. Heavily T2-weighted images of the biliary and pancreatic ducts were obtained, and three-dimensional MRCP images were rendered by post processing. CONTRAST:  20mL MULTIHANCE GADOBENATE DIMEGLUMINE 529 MG/ML IV SOLN COMPARISON:  Ultrasound 06/05/2017 and CT on 06/04/2017. FINDINGS: Lower chest: No acute findings. Hepatobiliary: No masses identified. No evidence of steatosis on chemical shift imaging. Tiny gallstones are seen, however there is no evidence of cholecystitis. No evidence of biliary ductal dilatation, with common bile duct measuring 5 mm. No evidence of choledocholithiasis. Pancreas: No mass or inflammatory  changes. No evidence of pancreatic ductal dilatation or pancreas divisum. Spleen:  Within normal limits in size and appearance. Adrenals/Urinary Tract: No masses identified. No evidence of hydronephrosis. Stomach/Bowel: Visualized portions within the abdomen are unremarkable. Vascular/Lymphatic: No pathologically enlarged lymph nodes identified. No abdominal aortic aneurysm. Other:  None. Musculoskeletal:  No suspicious bone lesions identified. IMPRESSION: Tiny gallstones. No radiographic evidence of cholecystitis or other acute findings. No evidence of biliary ductal dilatation or choledocholithiasis. No hepatic abnormality identified. Electronically Signed   By: Myles RosenthalJohn  Stahl M.D.   On: 06/06/2017 09:46   Mr Abdomen Mrcp Vivien RossettiW Wo Contast  Result Date: 06/06/2017 CLINICAL DATA:  Upper abdominal pain for 3 days. Elevated liver function tests. Cholelithiasis. EXAM: MRI ABDOMEN WITHOUT AND WITH CONTRAST (INCLUDING MRCP) TECHNIQUE: Multiplanar multisequence MR imaging of the abdomen was performed both before and after the administration of intravenous contrast. Heavily T2-weighted images of the biliary and pancreatic ducts were obtained, and three-dimensional MRCP images were rendered by post processing. CONTRAST:  20mL MULTIHANCE GADOBENATE DIMEGLUMINE 529 MG/ML IV SOLN COMPARISON:  Ultrasound 06/05/2017 and CT on 06/04/2017. FINDINGS: Lower chest: No acute findings. Hepatobiliary: No masses identified. No evidence of steatosis on chemical shift imaging. Tiny gallstones are seen, however there is no evidence of cholecystitis. No evidence of biliary ductal dilatation, with common bile duct measuring 5 mm. No evidence of choledocholithiasis. Pancreas: No mass or inflammatory changes. No evidence of pancreatic ductal dilatation or pancreas divisum. Spleen:  Within normal limits in size and appearance. Adrenals/Urinary Tract: No masses identified. No evidence of hydronephrosis. Stomach/Bowel: Visualized portions within the  abdomen are unremarkable. Vascular/Lymphatic: No pathologically enlarged lymph nodes identified. No abdominal aortic aneurysm. Other:  None. Musculoskeletal:  No suspicious bone lesions identified. IMPRESSION: Tiny gallstones. No radiographic evidence of cholecystitis or other acute findings. No evidence of biliary ductal dilatation or choledocholithiasis. No hepatic abnormality identified. Electronically Signed   By: Myles RosenthalJohn  Stahl M.D.   On: 06/06/2017 09:46   [place] Abdomen Limited Ruq  Result Date: 06/05/2017 CLINICAL DATA:  Abdominal pain EXAM: ULTRASOUND ABDOMEN LIMITED RIGHT UPPER QUADRANT COMPARISON:  CT 06/04/2017 FINDINGS: Gallbladder: Layering small stones within the gallbladder. No wall thickening. Negative sonographic Murphy's. Common bile duct: Diameter: Upper limits normal at 7 mm Liver: No focal lesion identified. Within normal limits in parenchymal echogenicity. IMPRESSION: Cholelithiasis.  No sonographic evidence of acute cholecystitis. Electronically Signed   By: Charlett NoseKevin  Dover M.D.   On: 06/05/2017 10:28        Scheduled Meds: . prenatal multivitamin  1 tablet Oral QPM   Continuous Infusions: . dextrose 5 % and 0.9% NaCl 125 mL/hr at 06/06/17 0741     LOS: 2 days    Time spent: 25mins    MEMON,JEHANZEB, MD [facility] Pager [phone]  If 7PM-7AM, please contact night-coverage [email or web address] Password [email or web address] 06/06/2017, 3:27  PM

## 2017-06-06 NOTE — Progress Notes (Signed)
Subjective: Denies abdominal pain, N/V, chest pain, dizziness, shortness of breath, hematochezia, melena. Is hungry and wants to eat. No other GI complaints.  Objective: Vital signs in last 24 hours: Temp:  [97.8 F (36.6 C)-98 F (36.7 C)] 98 F (36.7 C) (07/16 0637) Pulse Rate:  [63-74] 68 (07/16 0637) Resp:  [18] 18 (07/16 0637) BP: (121-132)/(78-82) 132/82 (07/16 0637) SpO2:  [99 %-100 %] 99 % (07/16 3559)   General:   Alert and oriented, pleasant Head:  Normocephalic and atraumatic. Eyes:  No icterus, sclera clear. Conjuctiva pink.  Mouth:  Without lesions, mucosa pink and moist.  Neck:  Supple, without thyromegaly or masses.  Heart:  S1, S2 present, no murmurs noted.  Lungs: Clear to auscultation bilaterally, without wheezing, rales, or rhonchi.  Abdomen:  Bowel sounds present, rounded but soft, non-tender, non-distended. No HSM or hernias noted. No rebound or guarding. No masses appreciated  Msk:  Symmetrical without gross deformities. Pulses:  Normal bilateral DP pulses noted. Extremities:  Without clubbing or edema. Neurologic:  Alert and  oriented x4;  grossly normal neurologically. Psych:  Alert and cooperative. Normal mood and affect.  Intake/Output from previous day: 07/15 0701 - 07/16 0700 In: 780 [P.O.:780] Out: -  Intake/Output this shift: No intake/output data recorded.  Lab Results:  Recent Labs  06/04/17 1539 06/05/17 0547  WBC 10.0 6.0  HGB 13.4 12.5  HCT 40.8 38.7  PLT 316 281   BMET  Recent Labs  06/04/17 1539 06/05/17 0547  NA 140 140  K 4.1 3.8  CL 104 107  CO2 28 26  GLUCOSE 111* 91  BUN 12 9  CREATININE 0.75 0.71  CALCIUM 9.0 8.9   LFT  Recent Labs  06/04/17 1539 06/04/17 1832 06/05/17 0547 06/06/17 0716  PROT 7.6  --  6.7 6.8  ALBUMIN 3.9  --  3.4* 3.5  AST 546*  --  502* 170*  ALT 306*  --  408* 308*  ALKPHOS 173*  --  209* 226*  BILITOT 2.4* 2.7* 3.5* 1.5*  BILIDIR  --  1.5*  --  0.4  IBILI  --  1.2*  --   1.1*   PT/INR No results for input(s): LABPROT, INR in the last 72 hours. Hepatitis Panel No results for input(s): HEPBSAG, HCVAB, HEPAIGM, HEPBIGM in the last 72 hours.   Studies/Results: Ct Abdomen Pelvis W Contrast  Result Date: 06/04/2017 CLINICAL DATA:  Upper abdominal pain.  Elevated LFTs. EXAM: CT ABDOMEN AND PELVIS WITH CONTRAST TECHNIQUE: Multidetector CT imaging of the abdomen and pelvis was performed using the standard protocol following bolus administration of intravenous contrast. CONTRAST:  125m ISOVUE-300 IOPAMIDOL (ISOVUE-300) INJECTION 61% COMPARISON:  CT abdomen/pelvis 13 hours prior [facility] Lower chest: The lung bases are clear. No pleural fluid or consolidation. Hepatobiliary: The liver is prominent size spanning 20 cm craniocaudal. No focal hepatic lesion. There is minimal periportal edema. Gallbladder physiologically distended, no calcified stone. No biliary dilatation. Pancreas: No ductal dilatation or inflammation. Spleen: Normal in size without focal abnormality. Adrenals/Urinary Tract: Normal adrenal glands. Excreted contrast in both renal collecting systems likely residual from CT earlier this day. Homogeneous enhancement. No hydronephrosis or perinephric edema. Physiologically distended. Excreted intravenous contrast from prior CT. No bladder wall thickening. Stomach/Bowel: Stomach physiologically distended. No bowel obstruction or inflammation. Enteric contrast from prior CT is seen throughout the colon. Normal appendix. Vascular/Lymphatic: No significant vascular findings are present. No enlarged abdominal or pelvic lymph nodes. Reproductive: Uterus and bilateral  adnexa are unremarkable. Other: No free air, free fluid, or intra-abdominal fluid collection. Musculoskeletal: There are no acute or suspicious osseous abnormalities. IMPRESSION: 1. Borderline mild hepatomegaly. Mild periportal edema, may be due to hydration status. Given elevated LFTs,  hepatitis could produce a similar appearance. 2. Excreted intravenous contrast in the renal collecting systems and urinary bladder from CT earlier this day. Electronically Signed   By: Jeb Levering M.D.   On: 06/04/2017 17:42   US Abdomen Limited Ruq  Result Date: 06/05/2017 CLINICAL DATA:  Abdominal pain EXAM: ULTRASOUND ABDOMEN LIMITED RIGHT UPPER QUADRANT COMPARISON:  CT 06/04/2017 FINDINGS: Gallbladder: Layering small stones within the gallbladder. No wall thickening. Negative sonographic Murphy's. Common bile duct: Diameter: Upper limits normal at 7 mm Liver: No focal lesion identified. Within normal limits in parenchymal echogenicity. IMPRESSION: Cholelithiasis.  No sonographic evidence of acute cholecystitis. Electronically Signed   By: Rolm Baptise M.D.   On: 06/05/2017 10:28    Assessment: 26 year old female who had an uncomplicated vaginal delivery of a healthy baby boy [place] 7 weeks ago presented with elevated LFTs and abdominal pain. Noted cholelithiasis without overt cholecystitis or biliary dilation. LFTs have been trending down with expected lag in bilirubin. Her labs today show continued improvement in her LFTs. MRCP ordered and completed this morning, but not resulted. Remains NPO. She is without GI complaint today.  Clinically she seems much improved today. AST/ALT decreased to 170/308, mild likely inconsequential increase in alk phos from yesterday (209 to 226). Bilirubin significantly improved today to 1.5 from 3.5. MRCP pending.   Plan: 1. Remain NPO until MRCP resulted 2. Continue current medications 3. Monitor for any changes in LFTs (check tomorrow) 4. Likely needs outpatient cholecystectomy 5. Will need GI followup as outpatient to follow labs to baseline   Thank you for allowing [place] to participate in the care of Patricia [name]  Walden Field, DNP, AGNP-C Adult & Gerontological Nurse Practitioner [facility]     LOS: 2 days     06/06/2017, 9:21 AM   ADDENDUM: MRCP resulted and small gallstones noted, no cholecystitis, no CBD dilation (5 mm) and no evidence of choledocholithiasis.   Plan: 1. Cholecystectomy per surgeon.  2. Will follow labs to normal.  3. Plan outpatient follow-up in our office in 2 weeks after d/c.  4. Check LFTs Friday. (labs entered into the system).   5. We will sign off for now  Walden Field, DNP, AGNP-C Adult & Gerontological Nurse Practitioner [facility]

## 2017-06-06 NOTE — Progress Notes (Signed)
Subjective: Patient had a carb healthy diet for lunch. She tolerated that well. She denies any significant abdominal pain or emesis. She is currently pumping breastmilk.  Objective: Vital signs in last 24 hours: Temp:  [97.8 F (36.6 C)-98 F (36.7 C)] 98 F (36.7 C) (07/16 0637) Pulse Rate:  [63-74] 68 (07/16 0637) Resp:  [18] 18 (07/16 0637) BP: (121-132)/(78-82) 132/82 (07/16 0637) SpO2:  [99 %-100 %] 99 % (07/16 0637)    Intake/Output from previous day: 07/15 0701 - 07/16 0700 In: 780 [P.O.:780] Out: -  Intake/Output this shift: Total I/O In: 360 [P.O.:360] Out: -   General appearance: alert, cooperative and no distress  Lab Results:   Recent Labs  06/04/17 1539 06/05/17 0547  WBC 10.0 6.0  HGB 13.4 12.5  HCT 40.8 38.7  PLT 316 281   BMET  Recent Labs  06/04/17 1539 06/05/17 0547  NA 140 140  K 4.1 3.8  CL 104 107  CO2 28 26  GLUCOSE 111* 91  BUN 12 9  CREATININE 0.75 0.71  CALCIUM 9.0 8.9   PT/INR No results for input(s): LABPROT, INR in the last 72 hours.  Studies/Results: Ct Abdomen Pelvis W Contrast  Result Date: 06/04/2017 CLINICAL DATA:  Upper abdominal pain.  Elevated LFTs. EXAM: CT ABDOMEN AND PELVIS WITH CONTRAST TECHNIQUE: Multidetector CT imaging of the abdomen and pelvis was performed using the standard protocol following bolus administration of intravenous contrast. CONTRAST:  ISOVUE-300 IOPAMIDOL (ISOVUE-300) INJECTION 61% COMPARISON:  CT abdomen/pelvis 13 hours prior at [facility]. FINDINGS: Lower chest: The lung bases are clear. No pleural fluid or consolidation. Hepatobiliary: The liver is prominent size spanning 20 cm craniocaudal. No focal hepatic lesion. There is minimal periportal edema. Gallbladder physiologically distended, no calcified stone. No biliary dilatation. Pancreas: No ductal dilatation or inflammation. Spleen: Normal in size without focal abnormality. Adrenals/Urinary Tract: Normal adrenal glands.  Excreted contrast in both renal collecting systems likely residual from CT earlier this day. Homogeneous enhancement. No hydronephrosis or perinephric edema. Physiologically distended. Excreted intravenous contrast from prior CT. No bladder wall thickening. Stomach/Bowel: Stomach physiologically distended. No bowel obstruction or inflammation. Enteric contrast from prior CT is seen throughout the colon. Normal appendix. Vascular/Lymphatic: No significant vascular findings are present. No enlarged abdominal or pelvic lymph nodes. Reproductive: Uterus and bilateral adnexa are unremarkable. Other: No free air, free fluid, or intra-abdominal fluid collection. Musculoskeletal: There are no acute or suspicious osseous abnormalities. IMPRESSION: 1. Borderline mild hepatomegaly. Mild periportal edema, may be due to hydration status. Given elevated LFTs, hepatitis could produce a similar appearance. 2. Excreted intravenous contrast in the renal collecting systems and urinary bladder from CT earlier this day. Electronically Signed   By: Rubye Oaks M.D.   On: 06/04/2017 17:42   Mr 3d Recon At Scanner  Result Date: 06/06/2017 CLINICAL DATA:  Upper abdominal pain for 3 days. Elevated liver function tests. Cholelithiasis. EXAM: MRI ABDOMEN WITHOUT AND WITH CONTRAST (INCLUDING MRCP) TECHNIQUE: Multiplanar multisequence MR imaging of the abdomen was performed both before and after the administration of intravenous contrast. Heavily T2-weighted images of the biliary and pancreatic ducts were obtained, and three-dimensional MRCP images were rendered by post processing. CONTRAST:  20mL MULTIHANCE GADOBENATE DIMEGLUMINE 529 MG/ML IV SOLN COMPARISON:  Ultrasound 06/05/2017 and CT on 06/04/2017. FINDINGS: Lower chest: No acute findings. Hepatobiliary: No masses identified. No evidence of steatosis on chemical shift imaging. Tiny gallstones are seen, however there is no evidence of cholecystitis. No evidence of biliary ductal  dilatation,  with common bile duct measuring 5 mm. No evidence of choledocholithiasis. Pancreas: No mass or inflammatory changes. No evidence of pancreatic ductal dilatation or pancreas divisum. Spleen:  Within normal limits in size and appearance. Adrenals/Urinary Tract: No masses identified. No evidence of hydronephrosis. Stomach/Bowel: Visualized portions within the abdomen are unremarkable. Vascular/Lymphatic: No pathologically enlarged lymph nodes identified. No abdominal aortic aneurysm. Other:  None. Musculoskeletal:  No suspicious bone lesions identified. IMPRESSION: Tiny gallstones. No radiographic evidence of cholecystitis or other acute findings. No evidence of biliary ductal dilatation or choledocholithiasis. No hepatic abnormality identified. Electronically Signed   By: Myles RosenthalJohn  Stahl M.D.   On: 06/06/2017 09:46   Mr Abdomen Mrcp Vivien RossettiW Wo Contast  Result Date: 06/06/2017 CLINICAL DATA:  Upper abdominal pain for 3 days. Elevated liver function tests. Cholelithiasis. EXAM: MRI ABDOMEN WITHOUT AND WITH CONTRAST (INCLUDING MRCP) TECHNIQUE: Multiplanar multisequence MR imaging of the abdomen was performed both before and after the administration of intravenous contrast. Heavily T2-weighted images of the biliary and pancreatic ducts were obtained, and three-dimensional MRCP images were rendered by post processing. CONTRAST:  20mL MULTIHANCE GADOBENATE DIMEGLUMINE 529 MG/ML IV SOLN COMPARISON:  Ultrasound 06/05/2017 and CT on 06/04/2017. FINDINGS: Lower chest: No acute findings. Hepatobiliary: No masses identified. No evidence of steatosis on chemical shift imaging. Tiny gallstones are seen, however there is no evidence of cholecystitis. No evidence of biliary ductal dilatation, with common bile duct measuring 5 mm. No evidence of choledocholithiasis. Pancreas: No mass or inflammatory changes. No evidence of pancreatic ductal dilatation or pancreas divisum. Spleen:  Within normal limits in size and appearance.  Adrenals/Urinary Tract: No masses identified. No evidence of hydronephrosis. Stomach/Bowel: Visualized portions within the abdomen are unremarkable. Vascular/Lymphatic: No pathologically enlarged lymph nodes identified. No abdominal aortic aneurysm. Other:  None. Musculoskeletal:  No suspicious bone lesions identified. IMPRESSION: Tiny gallstones. No radiographic evidence of cholecystitis or other acute findings. No evidence of biliary ductal dilatation or choledocholithiasis. No hepatic abnormality identified. Electronically Signed   By: Myles RosenthalJohn  Stahl M.D.   On: 06/06/2017 09:46   [place] Abdomen Limited Ruq  Result Date: 06/05/2017 CLINICAL DATA:  Abdominal pain EXAM: ULTRASOUND ABDOMEN LIMITED RIGHT UPPER QUADRANT COMPARISON:  CT 06/04/2017 FINDINGS: Gallbladder: Layering small stones within the gallbladder. No wall thickening. Negative sonographic Murphy's. Common bile duct: Diameter: Upper limits normal at 7 mm Liver: No focal lesion identified. Within normal limits in parenchymal echogenicity. IMPRESSION: Cholelithiasis.  No sonographic evidence of acute cholecystitis. Electronically Signed   By: Charlett NoseKevin  Dover M.D.   On: 06/05/2017 10:28    Anti-infectives: Anti-infectives    None      Assessment/Plan: Impression: MRCP negative for choledocholithiasis. Liver enzyme tests are trending downward. Plan: Will check labs in a.m. Anticipate laparoscopic cholecystectomy on 06/08/2017.  LOS: 2 days    Franky MachoMark Lando Alcalde 06/06/2017

## 2017-06-07 LAB — HEPATITIS PANEL, ACUTE
HCV AB: 0.1 {s_co_ratio} (ref 0.0–0.9)
HEP A IGM: NEGATIVE
Hep B C IgM: NEGATIVE
Hepatitis B Surface Ag: NEGATIVE

## 2017-06-07 LAB — HEPATIC FUNCTION PANEL
ALT: 184 U/L — ABNORMAL HIGH (ref 14–54)
AST: 53 U/L — AB (ref 15–41)
Albumin: 3.3 g/dL — ABNORMAL LOW (ref 3.5–5.0)
Alkaline Phosphatase: 180 U/L — ABNORMAL HIGH (ref 38–126)
BILIRUBIN DIRECT: 0.2 mg/dL (ref 0.1–0.5)
BILIRUBIN TOTAL: 0.8 mg/dL (ref 0.3–1.2)
Indirect Bilirubin: 0.6 mg/dL (ref 0.3–0.9)
Total Protein: 6.3 g/dL — ABNORMAL LOW (ref 6.5–8.1)

## 2017-06-07 MED ORDER — CHLORHEXIDINE GLUCONATE CLOTH 2 % EX PADS: 6.0000 | MEDICATED_PAD | Freq: Once | CUTANEOUS | Status: DC

## 2017-06-07 NOTE — Progress Notes (Signed)
Subjective: Patient has no abdominal pain. Tolerating diet well.  Objective: Vital signs in last 24 hours: Temp:  [98.4 F (36.9 C)] 98.4 F (36.9 C) (07/17 0500) Pulse Rate:  [62-65] 62 (07/17 0500) Resp:  [18] 18 (07/17 0500) BP: (114-145)/(66-96) 114/66 (07/17 0500) SpO2:  [98 %-100 %] 100 % (07/17 0500)    Intake/Output from previous day: 07/16 0701 - 07/17 0700 In: 600 [P.O.:600] Out: -  Intake/Output this shift: No intake/output data recorded.  General appearance: alert, cooperative and no distress GI: soft, non-tender; bowel sounds normal; no masses,  no organomegaly  Lab Results:   Recent Labs  06/04/17 1539 06/05/17 0547  WBC 10.0 6.0  HGB 13.4 12.5  HCT 40.8 38.7  PLT 316 281   BMET  Recent Labs  06/04/17 1539 06/05/17 0547  NA 140 140  K 4.1 3.8  CL 104 107  CO2 28 26  GLUCOSE 111* 91  BUN 12 9  CREATININE 0.75 0.71  CALCIUM 9.0 8.9   PT/INR No results for input(s): LABPROT, INR in the last 72 hours.  Studies/Results: Mr 3d Recon At Scanner  Result Date: 06/06/2017 CLINICAL DATA:  Upper abdominal pain for 3 days. Elevated liver function tests. Cholelithiasis. EXAM: MRI ABDOMEN WITHOUT AND WITH CONTRAST (INCLUDING MRCP) TECHNIQUE: Multiplanar multisequence MR imaging of the abdomen was performed both before and after the administration of intravenous contrast. Heavily T2-weighted images of the biliary and pancreatic ducts were obtained, and three-dimensional MRCP images were rendered by post processing. CONTRAST:  20mL MULTIHANCE GADOBENATE DIMEGLUMINE 529 MG/ML IV SOLN COMPARISON:  Ultrasound 06/05/2017 and CT on 06/04/2017. FINDINGS: Lower chest: No acute findings. Hepatobiliary: No masses identified. No evidence of steatosis on chemical shift imaging. Tiny gallstones are seen, however there is no evidence of cholecystitis. No evidence of biliary ductal dilatation, with common bile duct measuring 5 mm. No evidence of choledocholithiasis.  Pancreas: No mass or inflammatory changes. No evidence of pancreatic ductal dilatation or pancreas divisum. Spleen:  Within normal limits in size and appearance. Adrenals/Urinary Tract: No masses identified. No evidence of hydronephrosis. Stomach/Bowel: Visualized portions within the abdomen are unremarkable. Vascular/Lymphatic: No pathologically enlarged lymph nodes identified. No abdominal aortic aneurysm. Other:  None. Musculoskeletal:  No suspicious bone lesions identified. IMPRESSION: Tiny gallstones. No radiographic evidence of cholecystitis or other acute findings. No evidence of biliary ductal dilatation or choledocholithiasis. No hepatic abnormality identified. Electronically Signed   By: Myles Rosenthal M.D.   On: 06/06/2017 09:46   Mr Abdomen Mrcp Vivien Rossetti Contast  Result Date: 06/06/2017 CLINICAL DATA:  Upper abdominal pain for 3 days. Elevated liver function tests. Cholelithiasis. EXAM: MRI ABDOMEN WITHOUT AND WITH CONTRAST (INCLUDING MRCP) TECHNIQUE: Multiplanar multisequence MR imaging of the abdomen was performed both before and after the administration of intravenous contrast. Heavily T2-weighted images of the biliary and pancreatic ducts were obtained, and three-dimensional MRCP images were rendered by post processing. CONTRAST:  20mL MULTIHANCE GADOBENATE DIMEGLUMINE 529 MG/ML IV SOLN COMPARISON:  Ultrasound 06/05/2017 and CT on 06/04/2017. FINDINGS: Lower chest: No acute findings. Hepatobiliary: No masses identified. No evidence of steatosis on chemical shift imaging. Tiny gallstones are seen, however there is no evidence of cholecystitis. No evidence of biliary ductal dilatation, with common bile duct measuring 5 mm. No evidence of choledocholithiasis. Pancreas: No mass or inflammatory changes. No evidence of pancreatic ductal dilatation or pancreas divisum. Spleen:  Within normal limits in size and appearance. Adrenals/Urinary Tract: No masses identified. No evidence of hydronephrosis.  Stomach/Bowel: Visualized portions within the  abdomen are unremarkable. Vascular/Lymphatic: No pathologically enlarged lymph nodes identified. No abdominal aortic aneurysm. Other:  None. Musculoskeletal:  No suspicious bone lesions identified. IMPRESSION: Tiny gallstones. No radiographic evidence of cholecystitis or other acute findings. No evidence of biliary ductal dilatation or choledocholithiasis. No hepatic abnormality identified. Electronically Signed   By: Myles RosenthalJohn  Stahl M.D.   On: 06/06/2017 09:46   [place] Abdomen Limited Ruq  Result Date: 06/05/2017 CLINICAL DATA:  Abdominal pain EXAM: ULTRASOUND ABDOMEN LIMITED RIGHT UPPER QUADRANT COMPARISON:  CT 06/04/2017 FINDINGS: Gallbladder: Layering small stones within the gallbladder. No wall thickening. Negative sonographic Murphy's. Common bile duct: Diameter: Upper limits normal at 7 mm Liver: No focal lesion identified. Within normal limits in parenchymal echogenicity. IMPRESSION: Cholelithiasis.  No sonographic evidence of acute cholecystitis. Electronically Signed   By: Charlett NoseKevin  Dover M.D.   On: 06/05/2017 10:28    Anti-infectives: Anti-infectives    None      Assessment/Plan: Impression: Cholecystitis with cholelithiasis. Liver enzyme tests are normalizing. Plan: We'll proceed with laparoscopic cholecystectomy tomorrow. The risks and benefits of the procedure including bleeding, infection, hepatobiliary injury, and the possibility of an open procedure were fully explained to the patient, who gave informed consent.  LOS: 3 days    Franky MachoMark Rasheda Ledger 06/07/2017

## 2017-06-08 ENCOUNTER — Encounter (HOSPITAL_COMMUNITY): Payer: Self-pay

## 2017-06-08 ENCOUNTER — Inpatient Hospital Stay (HOSPITAL_COMMUNITY): Payer: 59 | Admitting: Anesthesiology

## 2017-06-08 ENCOUNTER — Encounter (HOSPITAL_COMMUNITY)
Admission: EM | Disposition: A | Discharge status: Home / Self Care | Payer: Self-pay | Source: Home / Self Care | Attending: Internal Medicine

## 2017-06-08 DIAGNOSIS — K8000 Calculus of gallbladder with acute cholecystitis without obstruction: Secondary | ICD-10-CM

## 2017-06-08 HISTORY — PX: CHOLECYSTECTOMY: SHX55

## 2017-06-08 LAB — SURGICAL PCR SCREEN
MRSA, PCR: NEGATIVE
Staphylococcus aureus: NEGATIVE

## 2017-06-08 SURGERY — LAPAROSCOPIC CHOLECYSTECTOMY
Anesthesia: General

## 2017-06-08 MED ORDER — MIDAZOLAM HCL 2 MG/2ML IJ SOLN
1.0000 mg | INTRAMUSCULAR | Status: DC
  Administered 2017-06-08: 2 mg via INTRAVENOUS

## 2017-06-08 MED ORDER — HYDROMORPHONE HCL 1 MG/ML IJ SOLN
0.2500 mg | INTRAMUSCULAR | Status: DC | PRN
  Administered 2017-06-08 (×2): 0.5 mg via INTRAVENOUS
  Filled 2017-06-08 (×2): qty 1

## 2017-06-08 MED ORDER — CHLORHEXIDINE GLUCONATE CLOTH 2 % EX PADS
6.0000 | MEDICATED_PAD | Freq: Once | CUTANEOUS | Status: AC
  Administered 2017-06-08: 6 via TOPICAL

## 2017-06-08 MED ORDER — HEMOSTATIC AGENTS (NO CHARGE) OPTIME
TOPICAL | Status: DC | PRN
  Administered 2017-06-08: 1 via TOPICAL

## 2017-06-08 MED ORDER — DIPHENHYDRAMINE HCL 50 MG/ML IJ SOLN: 25.0000 mg | Freq: Four times a day (QID) | INTRAMUSCULAR | Status: DC | PRN

## 2017-06-08 MED ORDER — POVIDONE-IODINE 10 % EX OINT
TOPICAL_OINTMENT | CUTANEOUS | Status: AC
  Filled 2017-06-08: qty 1

## 2017-06-08 MED ORDER — SUCCINYLCHOLINE CHLORIDE 20 MG/ML IJ SOLN
INTRAMUSCULAR | Status: DC | PRN
  Administered 2017-06-08: 140 mg via INTRAVENOUS

## 2017-06-08 MED ORDER — LACTATED RINGERS IV SOLN
INTRAVENOUS | Status: DC
  Administered 2017-06-08 (×2): via INTRAVENOUS

## 2017-06-08 MED ORDER — ACETAMINOPHEN 325 MG PO TABS: 650.0000 mg | ORAL_TABLET | Freq: Four times a day (QID) | ORAL | Status: DC | PRN

## 2017-06-08 MED ORDER — PROPOFOL 10 MG/ML IV BOLUS
INTRAVENOUS | Status: DC | PRN
  Administered 2017-06-08: 100 mg via INTRAVENOUS
  Administered 2017-06-08: 150 mg via INTRAVENOUS

## 2017-06-08 MED ORDER — DEXTROSE 5 % IV SOLN
INTRAVENOUS | Status: DC | PRN
  Administered 2017-06-08: 13:00:00 via INTRAVENOUS

## 2017-06-08 MED ORDER — POVIDONE-IODINE 10 % OINT PACKET
TOPICAL_OINTMENT | CUTANEOUS | Status: DC | PRN
  Administered 2017-06-08: 1 via TOPICAL

## 2017-06-08 MED ORDER — GLYCOPYRROLATE 0.2 MG/ML IJ SOLN
0.2000 mg | Freq: Once | INTRAMUSCULAR | Status: AC
  Administered 2017-06-08: 0.2 mg via INTRAVENOUS

## 2017-06-08 MED ORDER — FENTANYL CITRATE (PF) 100 MCG/2ML IJ SOLN
INTRAMUSCULAR | Status: DC | PRN
  Administered 2017-06-08 (×5): 50 ug via INTRAVENOUS

## 2017-06-08 MED ORDER — BUPIVACAINE HCL (PF) 0.5 % IJ SOLN
INTRAMUSCULAR | Status: DC | PRN
  Administered 2017-06-08: 14 mL

## 2017-06-08 MED ORDER — LIDOCAINE HCL (PF) 1 % IJ SOLN
INTRAMUSCULAR | Status: AC
  Filled 2017-06-08: qty 5

## 2017-06-08 MED ORDER — BUPIVACAINE HCL (PF) 0.5 % IJ SOLN
INTRAMUSCULAR | Status: AC
  Filled 2017-06-08: qty 30

## 2017-06-08 MED ORDER — ROCURONIUM BROMIDE 100 MG/10ML IV SOLN
INTRAVENOUS | Status: DC | PRN
  Administered 2017-06-08: 20 mg via INTRAVENOUS
  Administered 2017-06-08: 10 mg via INTRAVENOUS

## 2017-06-08 MED ORDER — ONDANSETRON HCL 4 MG/2ML IJ SOLN
4.0000 mg | Freq: Once | INTRAMUSCULAR | Status: AC
  Administered 2017-06-08: 4 mg via INTRAVENOUS

## 2017-06-08 MED ORDER — PROPOFOL 10 MG/ML IV BOLUS
INTRAVENOUS | Status: AC
  Filled 2017-06-08: qty 20

## 2017-06-08 MED ORDER — CIPROFLOXACIN IN D5W 400 MG/200ML IV SOLN
400.0000 mg | INTRAVENOUS | Status: AC
  Administered 2017-06-08: 400 mg via INTRAVENOUS
  Filled 2017-06-08: qty 200

## 2017-06-08 MED ORDER — 0.9 % SODIUM CHLORIDE (POUR BTL) OPTIME
TOPICAL | Status: DC | PRN
  Administered 2017-06-08: 1000 mL

## 2017-06-08 MED ORDER — GLYCOPYRROLATE 0.2 MG/ML IJ SOLN
INTRAMUSCULAR | Status: AC
  Filled 2017-06-08: qty 1

## 2017-06-08 MED ORDER — SIMETHICONE 80 MG PO CHEW: 40.0000 mg | CHEWABLE_TABLET | Freq: Four times a day (QID) | ORAL | Status: DC | PRN

## 2017-06-08 MED ORDER — GLYCOPYRROLATE 0.2 MG/ML IJ SOLN
INTRAMUSCULAR | Status: AC
  Filled 2017-06-08: qty 3

## 2017-06-08 MED ORDER — HYDROCODONE-ACETAMINOPHEN 5-325 MG PO TABS: 1.0000 | ORAL_TABLET | ORAL | 0 refills | Status: DC | PRN

## 2017-06-08 MED ORDER — DEXTROSE-NACL 5-0.9 % IV SOLN
INTRAVENOUS | Status: DC
  Administered 2017-06-08: 05:00:00 via INTRAVENOUS

## 2017-06-08 MED ORDER — ONDANSETRON HCL 4 MG/2ML IJ SOLN: 4.0000 mg | Freq: Four times a day (QID) | INTRAMUSCULAR | Status: DC | PRN

## 2017-06-08 MED ORDER — BUPIVACAINE-EPINEPHRINE (PF) 0.5% -1:200000 IJ SOLN
INTRAMUSCULAR | Status: AC
  Filled 2017-06-08: qty 30

## 2017-06-08 MED ORDER — ACETAMINOPHEN 650 MG RE SUPP: 650.0000 mg | Freq: Four times a day (QID) | RECTAL | Status: DC | PRN

## 2017-06-08 MED ORDER — ONDANSETRON 4 MG PO TBDP: 4.0000 mg | ORAL_TABLET | Freq: Four times a day (QID) | ORAL | Status: DC | PRN

## 2017-06-08 MED ORDER — KETOROLAC TROMETHAMINE 30 MG/ML IJ SOLN
30.0000 mg | Freq: Once | INTRAMUSCULAR | Status: AC
  Administered 2017-06-08: 30 mg via INTRAVENOUS
  Filled 2017-06-08: qty 1

## 2017-06-08 MED ORDER — DIPHENHYDRAMINE HCL 25 MG PO CAPS: 25.0000 mg | ORAL_CAPSULE | Freq: Four times a day (QID) | ORAL | Status: DC | PRN

## 2017-06-08 MED ORDER — FENTANYL CITRATE (PF) 250 MCG/5ML IJ SOLN
INTRAMUSCULAR | Status: AC
  Filled 2017-06-08: qty 5

## 2017-06-08 MED ORDER — ENOXAPARIN SODIUM 40 MG/0.4ML ~~LOC~~ SOLN
40.0000 mg | Freq: Once | SUBCUTANEOUS | Status: AC
  Administered 2017-06-08: 40 mg via SUBCUTANEOUS
  Filled 2017-06-08: qty 0.4

## 2017-06-08 MED ORDER — LIDOCAINE HCL (CARDIAC) 20 MG/ML IV SOLN
INTRAVENOUS | Status: DC | PRN
  Administered 2017-06-08: 30 mg via INTRAVENOUS

## 2017-06-08 MED ORDER — HYDROCODONE-ACETAMINOPHEN 5-325 MG PO TABS
1.0000 | ORAL_TABLET | ORAL | Status: DC | PRN
  Administered 2017-06-08: 1 via ORAL
  Filled 2017-06-08: qty 1

## 2017-06-08 MED ORDER — MIDAZOLAM HCL 2 MG/2ML IJ SOLN
INTRAMUSCULAR | Status: AC
  Filled 2017-06-08: qty 2

## 2017-06-08 MED ORDER — LACTATED RINGERS IV SOLN
INTRAVENOUS | Status: DC
  Administered 2017-06-08: 17:00:00 via INTRAVENOUS

## 2017-06-08 MED ORDER — GLYCOPYRROLATE 0.2 MG/ML IJ SOLN
INTRAMUSCULAR | Status: DC | PRN
  Administered 2017-06-08: 0.2 mg via INTRAVENOUS
  Administered 2017-06-08: 0.6 mg via INTRAVENOUS

## 2017-06-08 MED ORDER — ROCURONIUM BROMIDE 50 MG/5ML IV SOLN
INTRAVENOUS | Status: AC
  Filled 2017-06-08: qty 1

## 2017-06-08 MED ORDER — NEOSTIGMINE METHYLSULFATE 10 MG/10ML IV SOLN
INTRAVENOUS | Status: DC | PRN
  Administered 2017-06-08: 4 mg via INTRAVENOUS

## 2017-06-08 SURGICAL SUPPLY — 45 items
APPLIER CLIP LAPSCP 10X32 DD (CLIP) ×2 IMPLANT
BAG HAMPER (MISCELLANEOUS) ×2 IMPLANT
BAG RETRIEVAL 10 (BASKET) ×1
BANDAGE ELASTIC 2 LF NS (GAUZE/BANDAGES/DRESSINGS) ×8 IMPLANT
CHLORAPREP W/TINT 26ML (MISCELLANEOUS) ×2 IMPLANT
CLOTH BEACON ORANGE TIMEOUT ST (SAFETY) ×2 IMPLANT
COVER LIGHT HANDLE STERIS (MISCELLANEOUS) ×4 IMPLANT
DECANTER SPIKE VIAL GLASS SM (MISCELLANEOUS) ×2 IMPLANT
ELECT REM PT RETURN 9FT ADLT (ELECTROSURGICAL) ×2
ELECTRODE REM PT RTRN 9FT ADLT (ELECTROSURGICAL) ×1 IMPLANT
FILTER SMOKE EVAC LAPAROSHD (FILTER) ×2 IMPLANT
FORMALIN 10 PREFIL 120ML (MISCELLANEOUS) ×2 IMPLANT
GLOVE BIOGEL PI IND STRL 7.0 (GLOVE) ×1 IMPLANT
GLOVE BIOGEL PI IND STRL 7.5 (GLOVE) ×2 IMPLANT
GLOVE BIOGEL PI INDICATOR 7.0 (GLOVE) ×1
GLOVE BIOGEL PI INDICATOR 7.5 (GLOVE) ×2
GLOVE SURG SS PI 7.5 STRL IVOR (GLOVE) ×2 IMPLANT
GOWN STRL REUS W/ TWL XL LVL3 (GOWN DISPOSABLE) ×1 IMPLANT
GOWN STRL REUS W/TWL LRG LVL3 (GOWN DISPOSABLE) ×4 IMPLANT
GOWN STRL REUS W/TWL XL LVL3 (GOWN DISPOSABLE) ×1
HEMOSTAT SNOW SURGICEL 2X4 (HEMOSTASIS) ×2 IMPLANT
INST SET LAPROSCOPIC AP (KITS) ×2 IMPLANT
IV NS IRRIG 3000ML ARTHROMATIC (IV SOLUTION) IMPLANT
KIT ROOM TURNOVER APOR (KITS) ×2 IMPLANT
MANIFOLD NEPTUNE II (INSTRUMENTS) ×2 IMPLANT
NEEDLE INSUFFLATION 14GA 120MM (NEEDLE) ×2 IMPLANT
NS IRRIG 1000ML POUR BTL (IV SOLUTION) ×2 IMPLANT
PACK LAP CHOLE LZT030E (CUSTOM PROCEDURE TRAY) ×2 IMPLANT
PAD ARMBOARD 7.5X6 YLW CONV (MISCELLANEOUS) ×2 IMPLANT
PENCIL HANDSWITCHING (ELECTRODE) ×2 IMPLANT
SET BASIN LINEN APH (SET/KITS/TRAYS/PACK) ×2 IMPLANT
SET TUBE IRRIG SUCTION NO TIP (IRRIGATION / IRRIGATOR) IMPLANT
SLEEVE ENDOPATH XCEL 5M (ENDOMECHANICALS) ×2 IMPLANT
SPONGE GAUZE 2X2 8PLY STRL LF (GAUZE/BANDAGES/DRESSINGS) ×8 IMPLANT
STAPLER VISISTAT (STAPLE) ×2 IMPLANT
SUT VICRYL 0 UR6 27IN ABS (SUTURE) ×2 IMPLANT
SYS BAG RETRIEVAL 10MM (BASKET) ×1
SYSTEM BAG RETRIEVAL 10MM (BASKET) ×1 IMPLANT
TAPE HYPAFIX 4 X30'CHARGABLE (GAUZE/BANDAGES/DRESSINGS) ×2 IMPLANT
TROCAR ENDO BLADELESS 11MM (ENDOMECHANICALS) ×2 IMPLANT
TROCAR XCEL NON-BLD 5MMX100MML (ENDOMECHANICALS) ×2 IMPLANT
TROCAR XCEL UNIV SLVE 11M 100M (ENDOMECHANICALS) ×2 IMPLANT
TUBE CONNECTING 12X1/4 (SUCTIONS) ×2 IMPLANT
TUBING INSUFFLATION (TUBING) ×2 IMPLANT
WARMER LAPAROSCOPE (MISCELLANEOUS) ×2 IMPLANT

## 2017-06-08 NOTE — Anesthesia Procedure Notes (Signed)
Procedure Name: Intubation Date/Time: 06/08/2017 1:22 PM Performed by: Andree Elk, AMY A Pre-anesthesia Checklist: Patient identified, Patient being monitored, Timeout performed, Emergency Drugs available and Suction available Patient Re-evaluated:Patient Re-evaluated prior to induction Oxygen Delivery Method: Circle System Utilized Preoxygenation: Pre-oxygenation with 100% oxygen Induction Type: IV induction, Rapid sequence and Cricoid Pressure applied Ventilation: Mask ventilation without difficulty Laryngoscope Size: Glidescope and 4 Grade View: Grade II Tube type: Oral Tube size: 7.0 mm Number of attempts: 2 (DLx1 Mac 3 then Glidescope) Airway Equipment and Method: Stylet Placement Confirmation: ETT inserted through vocal cords under direct vision,  positive ETCO2 and breath sounds checked- equal and bilateral Secured at: 21 cm Tube secured with: Tape Dental Injury: Teeth and Oropharynx as per pre-operative assessment

## 2017-06-08 NOTE — Anesthesia Postprocedure Evaluation (Signed)
Anesthesia Post Note  Patient: Patricia [name]  Procedure(s) Performed: Procedure(s) (LRB): LAPAROSCOPIC CHOLECYSTECTOMY (N/A)  Patient location during evaluation: PACU Anesthesia Type: General Level of consciousness: awake and alert, oriented and patient cooperative Pain management: pain level controlled Vital Signs Assessment: post-procedure vital signs reviewed and stable Respiratory status: spontaneous breathing and respiratory function stable Cardiovascular status: stable Postop Assessment: no signs of nausea or vomiting Anesthetic complications: no     Last Vitals:  Vitals:   06/08/17 1220 06/08/17 1430  BP: 128/72 139/79  Pulse:  73  Resp: 15 (!) 22  Temp:  36.8 C    Last Pain:  Vitals:   06/08/17 1430  TempSrc:   PainSc: Asleep                 ADAMS, AMY A

## 2017-06-08 NOTE — Discharge Instructions (Signed)
Laparoscopic Cholecystectomy, Care After °This sheet gives you information about how to care for yourself after your procedure. Your health care provider may also give you more specific instructions. If you have problems or questions, contact your health care provider. °What can I expect after the procedure? °After the procedure, it is common to have: °· Pain at your incision sites. You will be given medicines to control this pain. °· Mild nausea or vomiting. °· Bloating and possible shoulder pain from the air-like gas that was used during the procedure. °Follow these instructions at home: °Incision care  ° °· Follow instructions from your health care provider about how to take care of your incisions. Make sure you: °¨ Wash your hands with soap and water before you change your bandage (dressing). If soap and water are not available, use hand sanitizer. °¨ Change your dressing as told by your health care provider. °¨ Leave stitches (sutures), skin glue, or adhesive strips in place. These skin closures may need to be in place for 2 weeks or longer. If adhesive strip edges start to loosen and curl up, you may trim the loose edges. Do not remove adhesive strips completely unless your health care provider tells you to do that. °· Do not take baths, swim, or use a hot tub until your health care provider approves. Ask your health care provider if you can take showers. You may only be allowed to take sponge baths for bathing. °· Check your incision area every day for signs of infection. Check for: °¨ More redness, swelling, or pain. °¨ More fluid or blood. °¨ Warmth. °¨ Pus or a bad smell. °Activity  °· Do not drive or use heavy machinery while taking prescription pain medicine. °· Do not lift anything that is heavier than 10 lb (4.5 kg) until your health care provider approves. °· Do not play contact sports until your health care provider approves. °· Do not drive for 24 hours if you were given a medicine to help you relax  (sedative). °· Rest as needed. Do not return to work or school until your health care provider approves. °General instructions  °· Take over-the-counter and prescription medicines only as told by your health care provider. °· To prevent or treat constipation while you are taking prescription pain medicine, your health care provider may recommend that you: °¨ Drink enough fluid to keep your urine clear or pale yellow. °¨ Take over-the-counter or prescription medicines. °¨ Eat foods that are high in fiber, such as fresh fruits and vegetables, whole grains, and beans. °¨ Limit foods that are high in fat and processed sugars, such as fried and sweet foods. °Contact a health care provider if: °· You develop a rash. °· You have more redness, swelling, or pain around your incisions. °· You have more fluid or blood coming from your incisions. °· Your incisions feel warm to the touch. °· You have pus or a bad smell coming from your incisions. °· You have a fever. °· One or more of your incisions breaks open. °Get help right away if: °· You have trouble breathing. °· You have chest pain. °· You have increasing pain in your shoulders. °· You faint or feel dizzy when you stand. °· You have severe pain in your abdomen. °· You have nausea or vomiting that lasts for more than one day. °· You have leg pain. °This information is not intended to replace advice given to you by your health care provider. Make sure you discuss any   questions you have with your health care provider. °Document Released: 11/08/2005 Document Revised: 05/29/2016 Document Reviewed: 04/26/2016 °Elsevier Interactive Patient Education © 2017 [employer] ° °

## 2017-06-08 NOTE — Anesthesia Preprocedure Evaluation (Signed)
Anesthesia Evaluation  Patient identified by MRN, date of birth, ID band Patient awake    Reviewed: Allergy & Precautions, H&P , NPO status , Patient's Chart, lab work & pertinent test results  Airway Mallampati: II   Neck ROM: full    Dental  (+) Teeth Intact, Dental Advisory Given   Pulmonary neg pulmonary ROS, asthma ,    breath sounds clear to auscultation       Cardiovascular hypertension (PIH - resolved now),  Rhythm:regular Rate:Normal     Neuro/Psych  Headaches, negative psych ROS   GI/Hepatic neg GERD  ,  Endo/Other    Renal/GU      Musculoskeletal   Abdominal   Peds  Hematology   Anesthesia Other Findings   Reproductive/Obstetrics                             Anesthesia Physical Anesthesia Plan  ASA: II  Anesthesia Plan: General   Post-op Pain Management:    Induction: Intravenous, Rapid sequence and Cricoid pressure planned  PONV Risk Score and Plan:   Airway Management Planned: Oral ETT  Additional Equipment:   Intra-op Plan:   Post-operative Plan: Extubation in OR  Informed Consent: I have reviewed the patients History and Physical, chart, labs and discussed the procedure including the risks, benefits and alternatives for the proposed anesthesia with the patient or authorized representative who has indicated his/her understanding and acceptance.     Plan Discussed with:   Anesthesia Plan Comments:         Anesthesia Quick Evaluation

## 2017-06-08 NOTE — Op Note (Signed)
Patient:  Patricia [name]  DOB:  03-19-1991  MRN:  [id number]   Preop Diagnosis:  Cholecystitis, cholelithiasis  Postop Diagnosis:  Same  Procedure:  Laparoscopic cholecystectomy  Surgeon:  Franky MachoMark Lourie Retz, M.D.  Anes:  Gen. endotracheal  Indications:  Patient is a 26 year old black female who is post partum and presents with cholecystitis with cholelithiasis. She did have an MRCP due to elevated liver enzyme tests which was negative for choledocholithiasis the risks and benefits of the procedure including bleeding, infection, hepatobiliary injury, and the possibility of an open procedure were fully explained to the patient, who gave informed consent.  Procedure note:  The patient was placed in the supine position. After induction of general endotracheal anesthesia, the abdomen was prepped and draped using the usual sterile technique with DuraPrep. Surgical site confirmation was performed.  A supraumbilical incision was made down to the fascia. A Veress needle was introduced into the abdominal cavity and confirmation of placement was done using the saline drop test. The abdomen was then insufflated to 16 mmHg pressure. An 11 mm trocar was introduced into the abdominal cavity under direct visualization without difficulty. The patient was placed in reverse Trendelenburg position and an additional 11 mm trocar was placed the epigastric region and 5 mm trochars were placed the right upper quadrant and right flank regions. Liver was inspected and noted to the normal limits. The gallbladder was retracted in a dynamic fashion in order to provide a critical view of the triangle of Calot. The cystic duct was first identified. Its juncture to the infundibulum was fully identified. Endoclips were placed proximally and distally on the cystic duct, and the cystic duct was divided. This was likewise done to the cystic artery. The gallbladder was freed away from the gallbladder fossa using Bovie electrocautery. The  gallbladder was delivered through the epigastric trocar site using an Endo Catch bag. The gallbladder fossa was inspected and no abnormal bleeding or bile leakage was noted. Surgicel was placed in the gallbladder fossa. All fluid and air were then evacuated from the abdominal cavity prior to the removal of the trochars.  All wounds were irrigated with normal saline. All wounds were injected with 0.5% Sensorcaine. The incisions were closed using staples. Betadine ointment and dry sterile dressings were applied.  All tape and needle counts were correct at the end of the procedure. The patient was extubated in the operating room and transferred to PACU in stable condition.  Complications:  None  EBL:  Minimal  Specimen:  Gallbladder

## 2017-06-08 NOTE — H&P (View-Only) (Signed)
Subjective: Patient has no abdominal pain. Tolerating diet well.  Objective: Vital signs in last 24 hours: Temp:  [98.4 F (36.9 C)] 98.4 F (36.9 C) (07/17 0500) Pulse Rate:  [62-65] 62 (07/17 0500) Resp:  [18] 18 (07/17 0500) BP: (114-145)/(66-96) 114/66 (07/17 0500) SpO2:  [98 %-100 %] 100 % (07/17 0500)    Intake/Output from previous day: 07/16 0701 - 07/17 0700 In: 600 [P.O.:600] Out: -  Intake/Output this shift: No intake/output data recorded.  General appearance: alert, cooperative and no distress GI: soft, non-tender; bowel sounds normal; no masses,  no organomegaly  Lab Results:   Recent Labs  06/04/17 1539 06/05/17 0547  WBC 10.0 6.0  HGB 13.4 12.5  HCT 40.8 38.7  PLT 316 281   BMET  Recent Labs  06/04/17 1539 06/05/17 0547  NA 140 140  K 4.1 3.8  CL 104 107  CO2 28 26  GLUCOSE 111* 91  BUN 12 9  CREATININE 0.75 0.71  CALCIUM 9.0 8.9   PT/INR No results for input(s): LABPROT, INR in the last 72 hours.  Studies/Results: Mr 3d Recon At Scanner  Result Date: 06/06/2017 CLINICAL DATA:  Upper abdominal pain for 3 days. Elevated liver function tests. Cholelithiasis. EXAM: MRI ABDOMEN WITHOUT AND WITH CONTRAST (INCLUDING MRCP) TECHNIQUE: Multiplanar multisequence MR imaging of the abdomen was performed both before and after the administration of intravenous contrast. Heavily T2-weighted images of the biliary and pancreatic ducts were obtained, and three-dimensional MRCP images were rendered by post processing. CONTRAST:  20mL MULTIHANCE GADOBENATE DIMEGLUMINE 529 MG/ML IV SOLN COMPARISON:  Ultrasound 06/05/2017 and CT on 06/04/2017. FINDINGS: Lower chest: No acute findings. Hepatobiliary: No masses identified. No evidence of steatosis on chemical shift imaging. Tiny gallstones are seen, however there is no evidence of cholecystitis. No evidence of biliary ductal dilatation, with common bile duct measuring 5 mm. No evidence of choledocholithiasis.  Pancreas: No mass or inflammatory changes. No evidence of pancreatic ductal dilatation or pancreas divisum. Spleen:  Within normal limits in size and appearance. Adrenals/Urinary Tract: No masses identified. No evidence of hydronephrosis. Stomach/Bowel: Visualized portions within the abdomen are unremarkable. Vascular/Lymphatic: No pathologically enlarged lymph nodes identified. No abdominal aortic aneurysm. Other:  None. Musculoskeletal:  No suspicious bone lesions identified. IMPRESSION: Tiny gallstones. No radiographic evidence of cholecystitis or other acute findings. No evidence of biliary ductal dilatation or choledocholithiasis. No hepatic abnormality identified. Electronically Signed   By: Myles Rosenthal M.D.   On: 06/06/2017 09:46   Mr Abdomen Mrcp Vivien Rossetti Contast  Result Date: 06/06/2017 CLINICAL DATA:  Upper abdominal pain for 3 days. Elevated liver function tests. Cholelithiasis. EXAM: MRI ABDOMEN WITHOUT AND WITH CONTRAST (INCLUDING MRCP) TECHNIQUE: Multiplanar multisequence MR imaging of the abdomen was performed both before and after the administration of intravenous contrast. Heavily T2-weighted images of the biliary and pancreatic ducts were obtained, and three-dimensional MRCP images were rendered by post processing. CONTRAST:  20mL MULTIHANCE GADOBENATE DIMEGLUMINE 529 MG/ML IV SOLN COMPARISON:  Ultrasound 06/05/2017 and CT on 06/04/2017. FINDINGS: Lower chest: No acute findings. Hepatobiliary: No masses identified. No evidence of steatosis on chemical shift imaging. Tiny gallstones are seen, however there is no evidence of cholecystitis. No evidence of biliary ductal dilatation, with common bile duct measuring 5 mm. No evidence of choledocholithiasis. Pancreas: No mass or inflammatory changes. No evidence of pancreatic ductal dilatation or pancreas divisum. Spleen:  Within normal limits in size and appearance. Adrenals/Urinary Tract: No masses identified. No evidence of hydronephrosis.  Stomach/Bowel: Visualized portions within the  abdomen are unremarkable. Vascular/Lymphatic: No pathologically enlarged lymph nodes identified. No abdominal aortic aneurysm. Other:  None. Musculoskeletal:  No suspicious bone lesions identified. IMPRESSION: Tiny gallstones. No radiographic evidence of cholecystitis or other acute findings. No evidence of biliary ductal dilatation or choledocholithiasis. No hepatic abnormality identified. Electronically Signed   By: Myles RosenthalJohn  Stahl M.D.   On: 06/06/2017 09:46   [place] Abdomen Limited Ruq  Result Date: 06/05/2017 CLINICAL DATA:  Abdominal pain EXAM: ULTRASOUND ABDOMEN LIMITED RIGHT UPPER QUADRANT COMPARISON:  CT 06/04/2017 FINDINGS: Gallbladder: Layering small stones within the gallbladder. No wall thickening. Negative sonographic Murphy's. Common bile duct: Diameter: Upper limits normal at 7 mm Liver: No focal lesion identified. Within normal limits in parenchymal echogenicity. IMPRESSION: Cholelithiasis.  No sonographic evidence of acute cholecystitis. Electronically Signed   By: Charlett NoseKevin  Dover M.D.   On: 06/05/2017 10:28    Anti-infectives: Anti-infectives    None      Assessment/Plan: Impression: Cholecystitis with cholelithiasis. Liver enzyme tests are normalizing. Plan: We'll proceed with laparoscopic cholecystectomy tomorrow. The risks and benefits of the procedure including bleeding, infection, hepatobiliary injury, and the possibility of an open procedure were fully explained to the patient, who gave informed consent.  LOS: 3 days    Franky MachoMark Tallin Hart 06/07/2017

## 2017-06-08 NOTE — Interval H&P Note (Signed)
History and Physical Interval Note:  06/08/2017 12:17 PM  Patricia [name]  has presented today for surgery, with the diagnosis of cholecystitis, cholelithiasis  The various methods of treatment have been discussed with the patient and family. After consideration of risks, benefits and other options for treatment, the patient has consented to  Procedure(s): LAPAROSCOPIC CHOLECYSTECTOMY (N/A) as a surgical intervention .  The patient's history has been reviewed, patient examined, no change in status, stable for surgery.  I have reviewed the patient's chart and labs.  Questions were answered to the patient's satisfaction.     Franky MachoMark Alianis Trimmer

## 2017-06-08 NOTE — Progress Notes (Signed)
Patient had just gotten up to the bathroom and was in terrible pain. She was unable to do IS at this time. RT will attempt to work with her soon.

## 2017-06-08 NOTE — Progress Notes (Signed)
Discharge instructions and prescriptions given, verbalized understanding, out in stable condition via w/c with staff. 

## 2017-06-08 NOTE — Transfer of Care (Signed)
Immediate Anesthesia Transfer of Care Note  Patient: Patricia [name]  Procedure(s) Performed: Procedure(s): LAPAROSCOPIC CHOLECYSTECTOMY (N/A)  Patient Location: PACU  Anesthesia Type:General  Level of Consciousness: awake, oriented and patient cooperative  Airway & Oxygen Therapy: Patient Spontanous Breathing  Post-op Assessment: Report given to RN and Post -op Vital signs reviewed and stable  Post vital signs: Reviewed and stable  Last Vitals:  Vitals:   06/08/17 1215 06/08/17 1220  BP: 127/79 128/72  Pulse:    Resp:  15  Temp:      Last Pain:  Vitals:   06/08/17 1038  TempSrc:   PainSc: 0-No pain      Patients Stated Pain Goal: 0 (06/07/17 1925)  Complications: No apparent anesthesia complications

## 2017-06-09 ENCOUNTER — Encounter (HOSPITAL_COMMUNITY): Payer: Self-pay | Admitting: General Surgery

## 2017-06-09 NOTE — Discharge Summary (Signed)
Physician Discharge Summary  Patient ID: Patricia [name] MRN: [id number] DOB/AGE: Dec 24, 1990 26 y.o.  Admit date: 06/04/2017 Discharge date: 06/08/2017 Admission Diagnoses: Abdominal pain, cholelithiasis, transaminitis  Discharge Diagnoses: Same, cholelithiasis Principal Problem:   Abdominal pain Active Problems:   PCO (polycystic ovaries)   Obesity, Class III, BMI 40-49.9 (morbid obesity) (HCC)   History of gestational hypertension   Biliary colic   Gall stones, common bile duct   Nausea vomiting and diarrhea   RUQ abdominal pain   Transaminitis   Calculus of gallbladder with acute cholecystitis without obstruction   Discharged Condition: good  Hospital Course: Patient is a 26 year old black female who is several weeks postpartum who presented with abdominal pain. She was found to have elevated liver enzyme tests as well as cholelithiasis and a dilated common bile duct. She was admitted to the hospital for further evaluation and treatment. Both surgery and GI were consulted. MRCP was negative for choledocholithiasis. Her liver enzyme tests started to normalize. Her abdominal pain did start resolving. She subsequently underwent laparoscopic cholecystectomy on 06/08/2017. She tolerated the procedure well. Her postoperative course was unremarkable. Her diet was advanced without difficulty. The patient was discharged home on 06/08/2017 in good and improving condition. Patient was instructed to breast pump and discard once. She can then resume breast-feeding.  Consults: GI and general surgery  Significant Diagnostic Studies: MRCP, CT scan of abdomen  Treatments: surgery: Laparoscopic cholecystectomy on 06/08/2017  Discharge Exam: Blood pressure 134/76, pulse 74, temperature 98 F (36.7 C), resp. rate 18, height 5\' 5"  (1.651 m), weight 252 lb 13.2 oz (114.7 kg), last menstrual period 05/28/2017, SpO2 96 %, currently breastfeeding. General appearance: alert, cooperative and no  distress Resp: clear to auscultation bilaterally Cardio: regular rate and rhythm, S1, S2 normal, no murmur, click, rub or gallop GI: Soft, incisions healing well.  Disposition: 01-Home or Self Care  Discharge Instructions    Diet general    Complete by:  As directed    Diet general    Complete by:  As directed    Increase activity slowly    Complete by:  As directed    Increase activity slowly    Complete by:  As directed      Allergies as of 06/08/2017      Reactions   Peanut-containing Drug Products Anaphylaxis, Hives   Propranolol Shortness Of Breath   Cephalexin Hives   Imitrex [sumatriptan] Hives      Medication List    TAKE these medications   albuterol 108 (90 Base) MCG/ACT inhaler Commonly known as:  PROVENTIL HFA;VENTOLIN HFA Inhale 2 puffs into the lungs every 4 (four) hours as needed for wheezing or shortness of breath.   Biotin 5000 MCG Caps Take 1 capsule by mouth daily.   butalbital-acetaminophen-caffeine 50-325-40 MG tablet Commonly known as:  FIORICET, ESGIC TAKE ONE TO TWO TABLETS BY MOUTH EVERY 6 HOURS AS NEEDED FOR HEADACHES   COMPLETE NATAL DHA 29-1-200 & 250 MG Misc Take 1 tablet by mouth every evening.   HYDROcodone-acetaminophen 5-325 MG tablet Commonly known as:  NORCO Take 1 tablet by mouth every 4 (four) hours as needed for moderate pain.   loratadine 10 MG tablet Commonly known as:  CLARITIN Take 10 mg by mouth daily as needed for allergies.   NEXPLANON 68 MG Impl implant Generic drug:  etonogestrel 1 each by Subdermal route once.   PREVIDENT 5000 BOOSTER PLUS 1.1 % Pste Generic drug:  Sodium Fluoride Apply topically daily.  Follow-up Information    Franky Macho, MD. Schedule an appointment as soon as possible for a visit on 06/16/2017.   Specialty:  General Surgery Contact information: [address] [place] [address] [phone]           Signed: Franky Macho 06/09/2017, 8:20 AM

## 2017-06-10 ENCOUNTER — Telehealth: Payer: Self-pay | Admitting: *Deleted

## 2017-06-10 ENCOUNTER — Other Ambulatory Visit (HOSPITAL_COMMUNITY)
Admission: RE | Admit: 2017-06-10 | Discharge: 2017-06-10 | Disposition: A | Discharge status: Home / Self Care | Payer: 59 | Source: Ambulatory Visit | Attending: Nurse Practitioner | Admitting: Nurse Practitioner

## 2017-06-10 DIAGNOSIS — R7989 Other specified abnormal findings of blood chemistry: Secondary | ICD-10-CM | POA: Diagnosis not present

## 2017-06-10 LAB — HEPATIC FUNCTION PANEL
ALT: 79 U/L — AB (ref 14–54)
AST: 26 U/L (ref 15–41)
Albumin: 3.6 g/dL (ref 3.5–5.0)
Alkaline Phosphatase: 142 U/L — ABNORMAL HIGH (ref 38–126)
BILIRUBIN DIRECT: 0.2 mg/dL (ref 0.1–0.5)
BILIRUBIN INDIRECT: 0.5 mg/dL (ref 0.3–0.9)
TOTAL PROTEIN: 7.2 g/dL (ref 6.5–8.1)
Total Bilirubin: 0.7 mg/dL (ref 0.3–1.2)

## 2017-06-10 NOTE — Telephone Encounter (Signed)
Patient called to know if Norco was safe while breastfeeding. Informed patient in small doses it is fine but to pump and dump otherwise. States she is already doing that due to taking contrast for CT. Advised just to continue doing that until she stops taking the Norco, drink lots of water and do skin to skin. Verbalized understanding.

## 2017-06-13 ENCOUNTER — Telehealth: Payer: Self-pay | Admitting: Obstetrics & Gynecology

## 2017-06-13 ENCOUNTER — Telehealth: Payer: Self-pay | Admitting: *Deleted

## 2017-06-13 NOTE — Telephone Encounter (Signed)
States she needs note to return to work from [employer] standpoint and surgery as well. Will pick up Friday.

## 2017-06-13 NOTE — Telephone Encounter (Signed)
LMOVM that she would probably need to get the note from Dr Lovell SheehanJenkins office on 7/26 at her appointment since they did the surgery but to give us a call if not and we will try to help her.

## 2017-06-16 ENCOUNTER — Ambulatory Visit (INDEPENDENT_AMBULATORY_CARE_PROVIDER_SITE_OTHER): Payer: Self-pay | Admitting: General Surgery

## 2017-06-16 ENCOUNTER — Encounter: Payer: Self-pay | Admitting: General Surgery

## 2017-06-16 VITALS — BP 147/83 | HR 74 | Temp 98.0°F | Resp 18 | Ht 65.0 in | Wt 248.0 lb

## 2017-06-16 DIAGNOSIS — Z09 Encounter for follow-up examination after completed treatment for conditions other than malignant neoplasm: Secondary | ICD-10-CM

## 2017-06-16 NOTE — Progress Notes (Signed)
Subjective:     Patricia [name]  Status post ERCP with stone extraction, laparoscopic cholecystectomy for stones. Doing very well. Has no complaints. Objective:    BP (!) 147/83   Pulse 74   Temp 98 F (36.7 C)   Resp 18   Ht 5\' 5"  (1.651 m)   Wt 248 lb (112.5 kg)   LMP 05/28/2017   BMI 41.27 kg/m   General:  alert, cooperative and no distress  Abdomen soft, incisions healing well. Staples removed, Steri-Strips applied. Final pathology consistent with diagnosis.      Assessment:    Doing well postoperatively.    Plan:   May return to work on 06/17/2017 without restrictions. Follow up here as needed.

## 2017-06-17 ENCOUNTER — Ambulatory Visit (INDEPENDENT_AMBULATORY_CARE_PROVIDER_SITE_OTHER): Payer: 59 | Admitting: Gastroenterology

## 2017-06-17 VITALS — BP 122/72 | HR 77 | Temp 97.6°F | Ht 65.0 in | Wt 248.8 lb

## 2017-06-17 DIAGNOSIS — R945 Abnormal results of liver function studies: Principal | ICD-10-CM

## 2017-06-17 DIAGNOSIS — R7989 Other specified abnormal findings of blood chemistry: Secondary | ICD-10-CM | POA: Diagnosis not present

## 2017-06-17 NOTE — Patient Instructions (Signed)
Please have blood work done around the second week of August.  We will see you back as needed!  Congrats on your baby and your new job!

## 2017-06-17 NOTE — Assessment & Plan Note (Signed)
Secondary to acute cholecystitis, cholecystitis, likely microlithiasis. Much improved post-cholecystectomy. Asymptomatic. Repeat LFTs in 2 weeks to ensure stabilization. Return prn.

## 2017-06-17 NOTE — Progress Notes (Signed)
cc'ed to pcp °

## 2017-06-17 NOTE — Progress Notes (Signed)
Referring Provider: Celene Squibb, MD Primary Care Physician:  Celene Squibb, MD Primary GI: Dr. Gala Romney   Chief Complaint  Patient presents with  . Abdominal Pain    HPI:   Patricia [name] is a 26 y.o. female presenting today with a history of cholelithiasis, cholecystitis, seen while inpatient in early July 2018 by GI due to elevated LFTs. She underwent cholecystectomy. MRCP done while inpatient due to elevated LFTs and was negative for choledocholithiasis.   No abdominal pain, N/V. Doing well recovering from the procedure. Saw Dr. Arnoldo Morale yesterday. Most recent LFTs a week agomuch improved with ALT 79 and Alk Phos 142. Otherwise normal. Some loose stool post-cholecystectomy "if I eat the wrong thing"   Past Medical History:  Diagnosis Date  . Asthma   . Cough 08/01/2015  . Migraine   . Migraines   . Nausea and vomiting during pregnancy prior to [redacted] weeks gestation 08/01/2015  . PCO (polycystic ovaries) 03/08/2014  . Polycystic disease, ovaries   . Pregnancy induced hypertension   . Pregnant 05/27/2015  . URI (upper respiratory infection) 08/01/2015    Past Surgical History:  Procedure Laterality Date  . CHOLECYSTECTOMY N/A 06/08/2017   Procedure: LAPAROSCOPIC CHOLECYSTECTOMY;  Surgeon: Aviva Signs, MD;  Location: [facility];  Service: General;  Laterality: N/A;  . TONSILLECTOMY    . WISDOM TOOTH EXTRACTION      Current Outpatient Prescriptions  Medication Sig Dispense Refill  . albuterol (PROVENTIL HFA;VENTOLIN HFA) 108 (90 Base) MCG/ACT inhaler Inhale 2 puffs into the lungs every 4 (four) hours as needed for wheezing or shortness of breath. 1 Inhaler 3  . Biotin 5000 MCG CAPS Take 1 capsule by mouth daily.    . butalbital-acetaminophen-caffeine (FIORICET, ESGIC) 50-325-40 MG tablet TAKE ONE TO TWO TABLETS BY MOUTH EVERY 6 HOURS AS NEEDED FOR HEADACHES  0  . etonogestrel (NEXPLANON) 68 MG IMPL implant 1 each by Subdermal route once.    . loratadine (CLARITIN) 10 MG tablet Take 10 mg  by mouth daily as needed for allergies.    . Prenat-FeBis-FePro-FA-CA-Omega (COMPLETE NATAL DHA) 29-1-200 & 250 MG MISC Take 1 tablet by mouth every evening.    Marland Kitchen PREVIDENT 5000 BOOSTER PLUS 1.1 % PSTE Apply topically daily.   1  . HYDROcodone-acetaminophen (NORCO) 5-325 MG tablet Take 1 tablet by mouth every 4 (four) hours as needed for moderate pain. (Patient not taking: Reported on 06/17/2017) 40 tablet 0   No current facility-administered medications for this visit.     Allergies as of 06/17/2017 - Review Complete 06/17/2017  Allergen Reaction Noted  . Peanut-containing drug products Anaphylaxis and Hives 04/27/2012  . Propranolol Shortness Of Breath 09/23/2014  . Cephalexin Hives 12/22/2011  . Imitrex [sumatriptan] Hives 10/12/2013    Family History  Problem Relation Age of Onset  . Migraines Sister   . Hypertension Brother   . Heart disease Maternal Grandmother   . Cancer Maternal Grandfather        prostate  . Stroke Paternal Grandmother   . Cancer Paternal Grandfather        prostate  . Stroke Paternal Grandfather   . Atrial fibrillation Father     Social History   Social History  . Marital status: Single    Spouse name: N/A  . Number of children: N/A  . Years of education: N/A   Social History Main Topics  . Smoking status: Never Smoker  . Smokeless tobacco: Never Used  . Alcohol use No  Comment: occassional  . Drug use: No  . Sexual activity: Not Currently    Birth control/ protection: None   Other Topics Concern  . Not on file   Social History Narrative  . No narrative on file    Review of Systems: As mentioned in HPI   Physical Exam: BP 122/72   Pulse 77   Temp 97.6 F (36.4 C) (Oral)   Ht 5' 5"  (1.651 m)   Wt 248 lb 12.8 oz (112.9 kg)   LMP 05/28/2017   BMI 41.40 kg/m  General:   Alert and oriented. No distress noted. Pleasant and cooperative.  Head:  Normocephalic and atraumatic. Eyes:  Conjuctiva clear without scleral  icterus. Mouth:  Oral mucosa pink and moist. Good dentition. No lesions. Abdomen:  +BS, soft, non-tender and non-distended. No rebound or guarding. No HSM or masses noted. Msk:  Symmetrical without gross deformities. Normal posture. Extremities:  Without edema. Neurologic:  Alert and  oriented x4;  grossly normal neurologically. Psych:  Alert and cooperative. Normal mood and affect.

## 2017-06-20 ENCOUNTER — Ambulatory Visit: Payer: 59 | Admitting: Nurse Practitioner

## 2017-06-21 ENCOUNTER — Ambulatory Visit: Payer: 59 | Admitting: Nurse Practitioner

## 2017-06-26 LAB — HEPATIC FUNCTION PANEL
ALT: 16 IU/L (ref 0–32)
AST: 16 IU/L (ref 0–40)
Albumin: 4.2 g/dL (ref 3.5–5.5)
Alkaline Phosphatase: 110 IU/L (ref 39–117)
Bilirubin Total: 0.4 mg/dL (ref 0.0–1.2)
Bilirubin, Direct: 0.15 mg/dL (ref 0.00–0.40)
TOTAL PROTEIN: 6.9 g/dL (ref 6.0–8.5)

## 2017-06-28 NOTE — Progress Notes (Signed)
Patricia HongJudy, your liver numbers are completely normal. Hope you are doing well!

## 2018-03-06 ENCOUNTER — Telehealth: Payer: Self-pay | Admitting: Obstetrics & Gynecology

## 2018-03-06 NOTE — Telephone Encounter (Signed)
Patient states she is waking up 2-3 days per week with breast leaking.  She has tried using cold cabbage leaves, no breast stimulation but is still leaking.  Informed patient that some women can still leak up to a year after breast feeding.  Encouraged to continue using cold cabbage leaves and tight fitting bra and just give it more time.  Stated she would.

## 2018-05-21 IMAGING — US US ABDOMEN LIMITED
1 series · 14 of 25 positions shown · non-contrast
Comparison: CT 06/04/2017

CLINICAL DATA: Abdominal pain

EXAM:
ULTRASOUND ABDOMEN LIMITED RIGHT UPPER QUADRANT

[Series 1: us abdomen limited · 0.24mm/px · 14 of 78 slices shown]
[im 1/78]
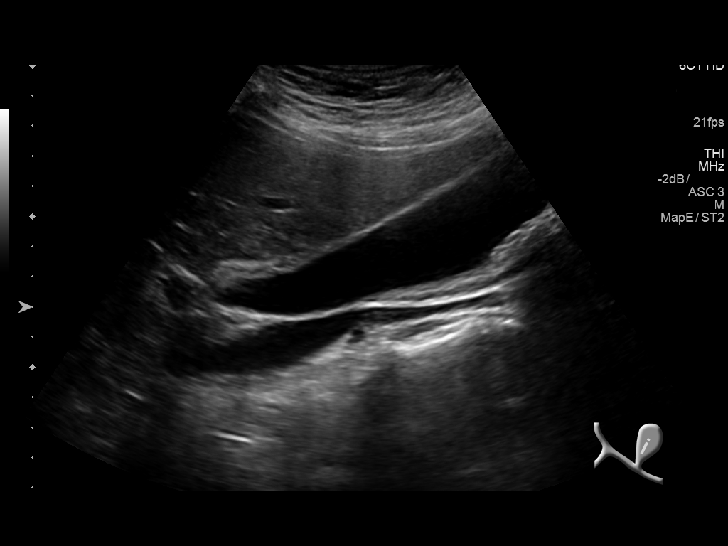
[im 7/78]
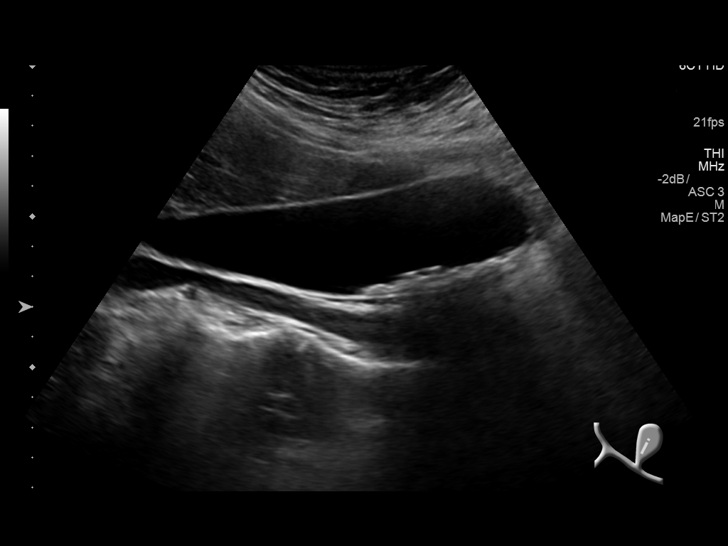
[im 13/78]
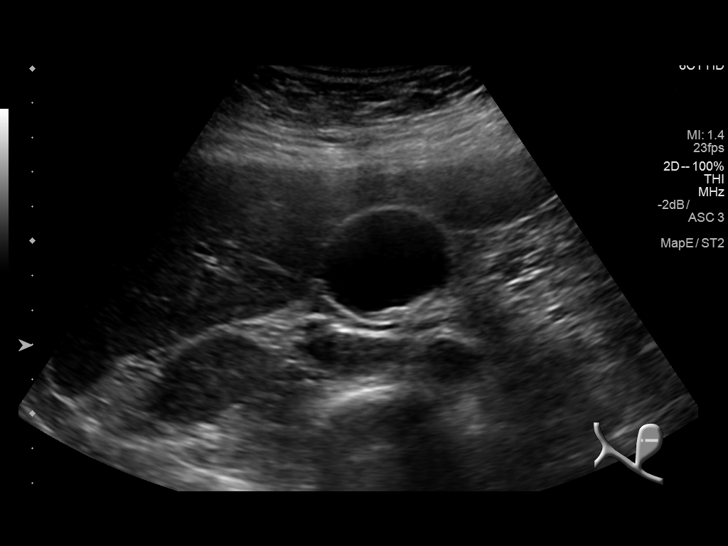
[im 20/78]
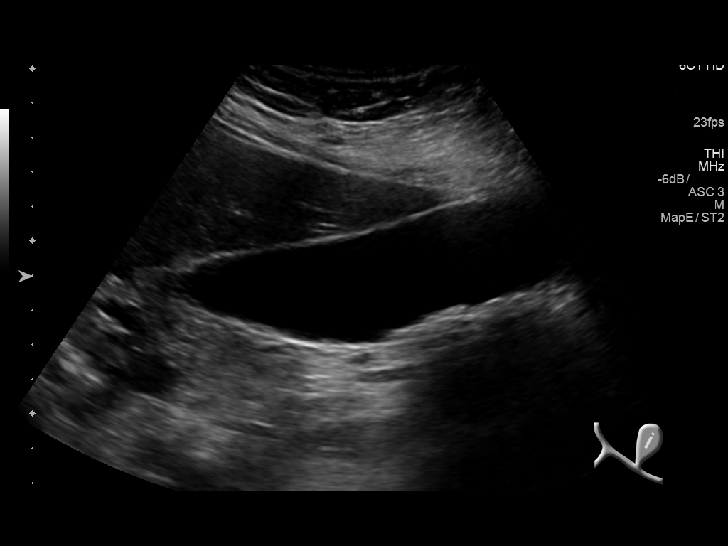
[im 26/78]
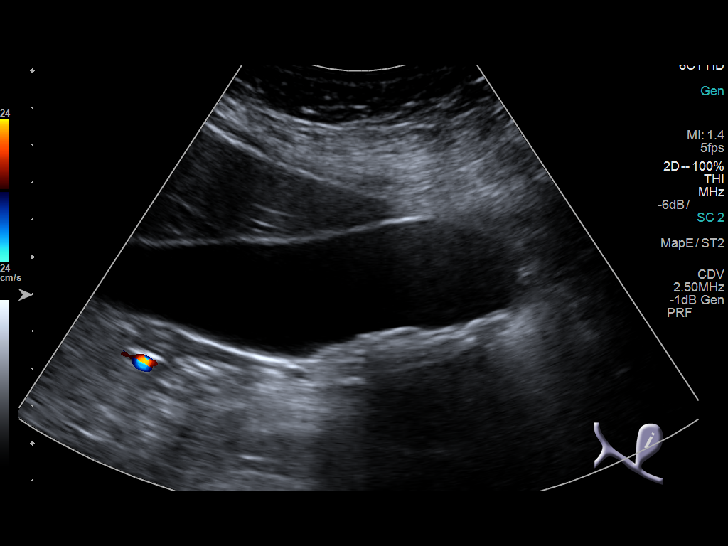
[im 29/78]
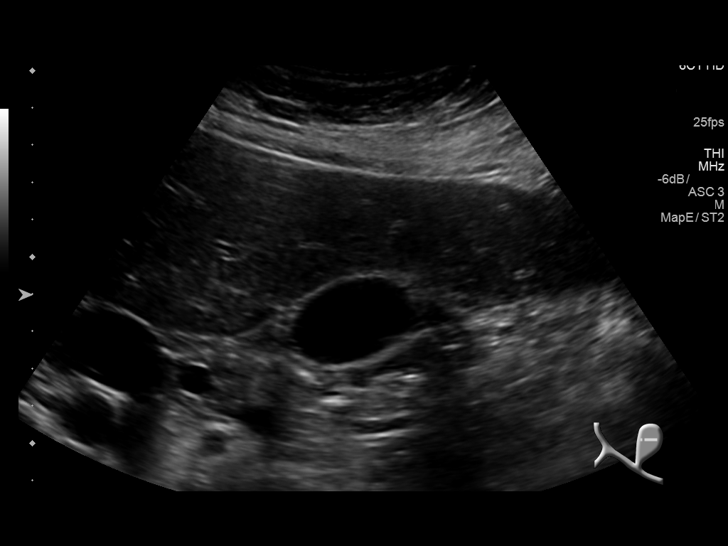
[im 36/78]
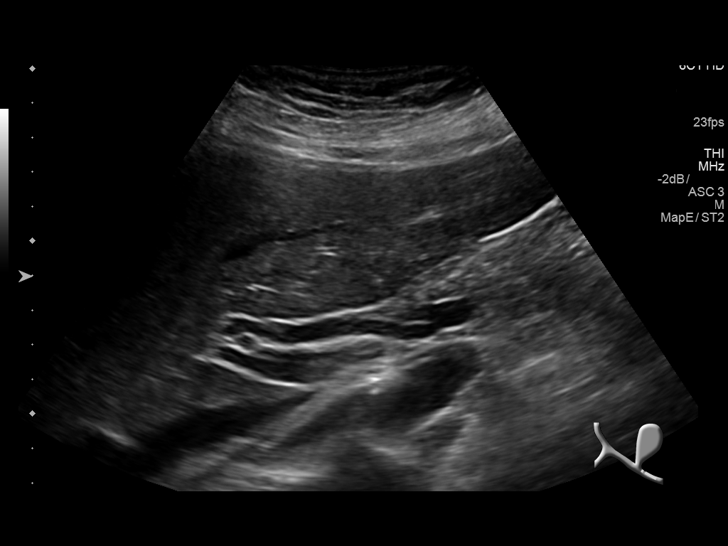
[im 42/78]
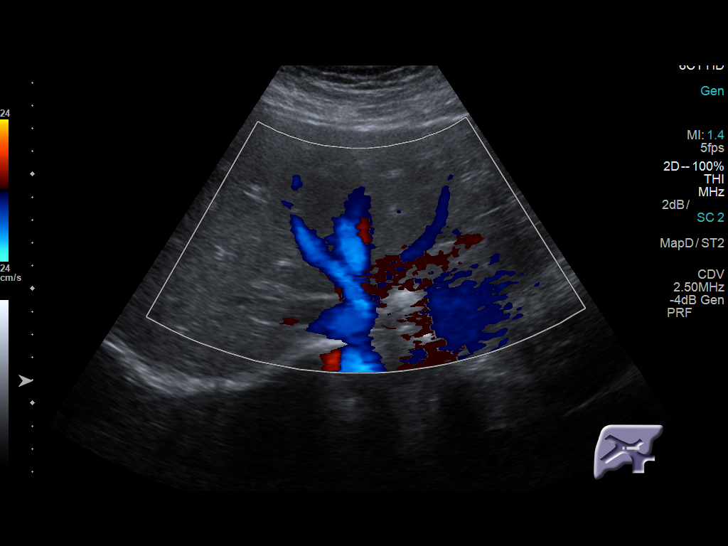
[im 49/78]
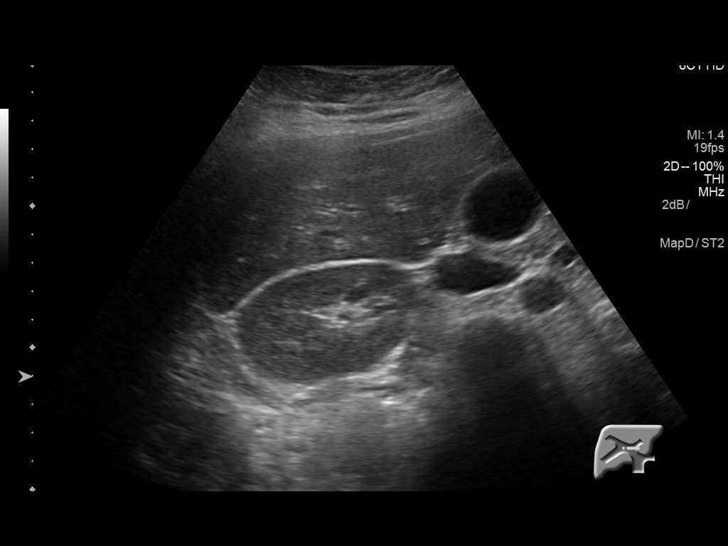
[im 52/78]
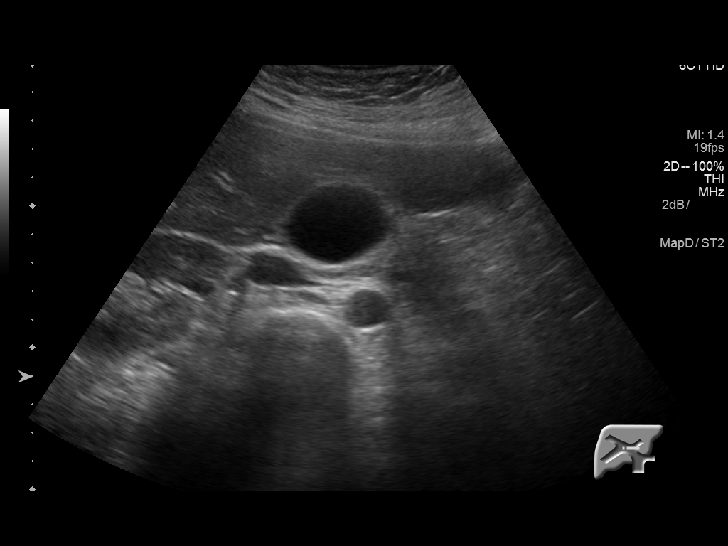
[im 58/78]
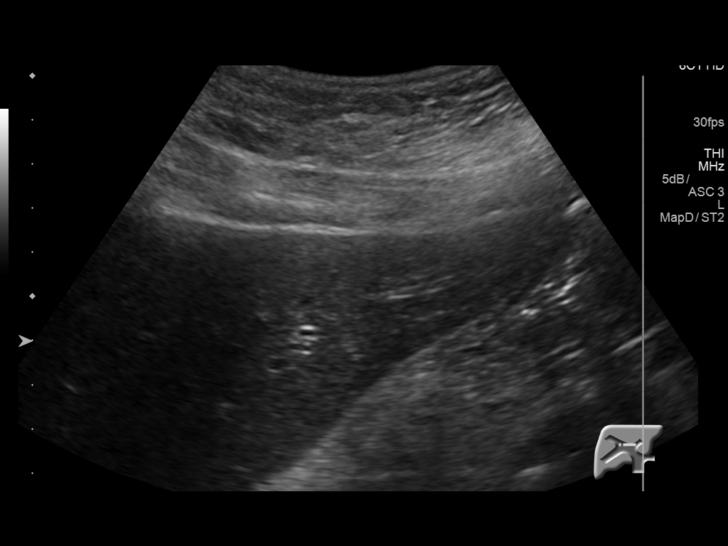
[im 65/78]
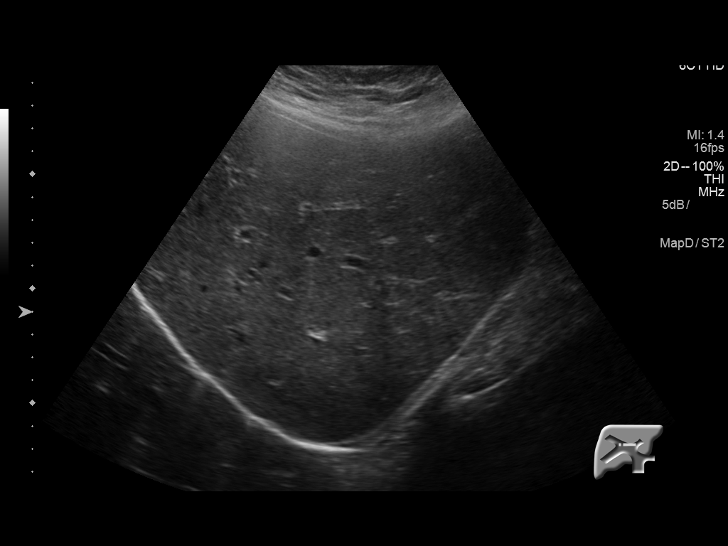
[im 71/78]
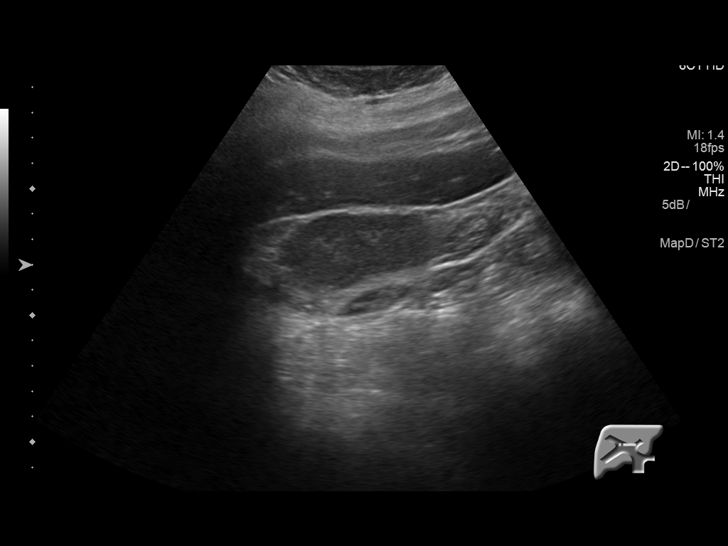
[im 78/78]
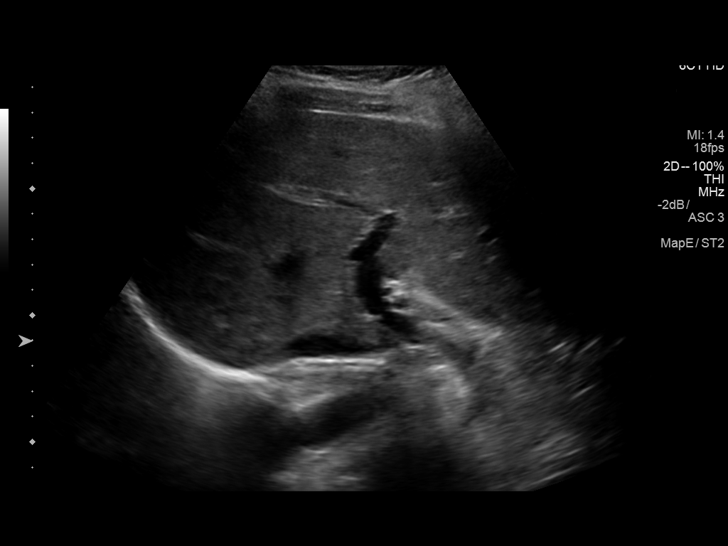

[14 of 25 positions shown; findings below may reference images not displayed]

FINDINGS: Gallbladder:

Layering small stones within the gallbladder. No wall thickening.
Negative sonographic [place].

Common bile duct:

Diameter: Upper limits normal at 7 mm

Liver:

No focal lesion identified. Within normal limits in parenchymal
echogenicity.
IMPRESSION: Cholelithiasis.  No sonographic evidence of acute cholecystitis.

## 2018-06-30 ENCOUNTER — Other Ambulatory Visit: Payer: 59 | Admitting: Women's Health

## 2018-07-05 ENCOUNTER — Other Ambulatory Visit: Payer: 59 | Admitting: Adult Health

## 2018-08-09 ENCOUNTER — Other Ambulatory Visit: Payer: Medicaid Other | Admitting: Advanced Practice Midwife

## 2018-08-10 IMAGING — CT CT ABD-PELV W/ CM
2 of 3 series · 16 of 46 positions shown, 18 images · IV contrast (Isovue)
Comparison: CT abdomen/pelvis 13 hours prior at [HOSPITAL] [facility].

CLINICAL DATA: Upper abdominal pain.  Elevated LFTs.

EXAM:
CT ABDOMEN AND PELVIS WITH CONTRAST
TECHNIQUE: Multidetector CT imaging of the abdomen and pelvis was performed
using the standard protocol following bolus administration of
intravenous contrast.
CONTRAST:  100mL 6EYFMV-HUU IOPAMIDOL (6EYFMV-HUU) INJECTION 61%

[Series 2: axial st · axial · 0.69mm/px · z∈[+912,+1277]mm · 13 of 85 slices shown, 15 images]
[im 6/85  soft-tissue]
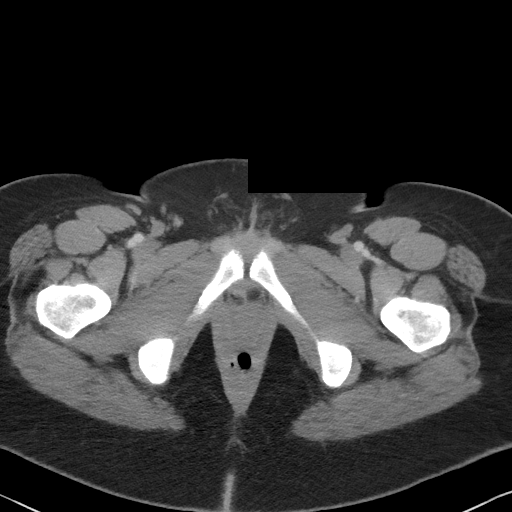
[im 6/85  bone]
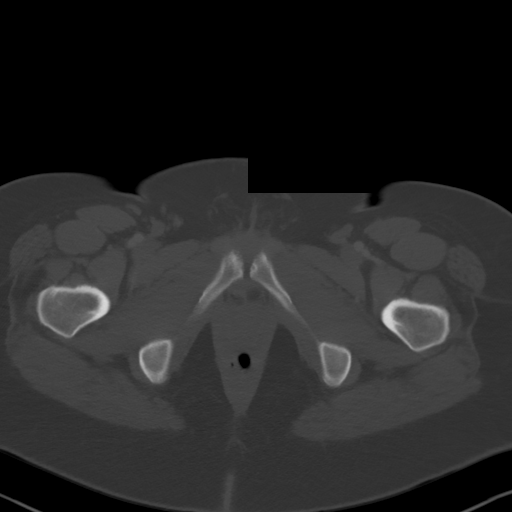
[im 11/85  soft-tissue]
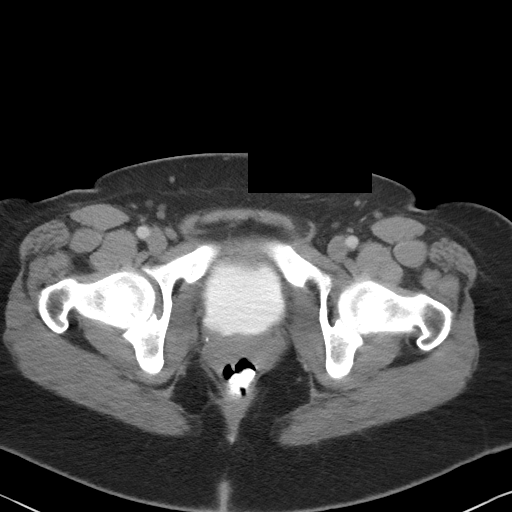
[im 17/85  soft-tissue]
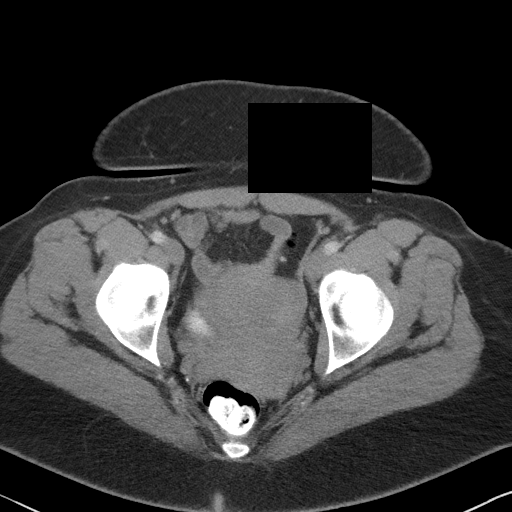
[im 25/85  soft-tissue]
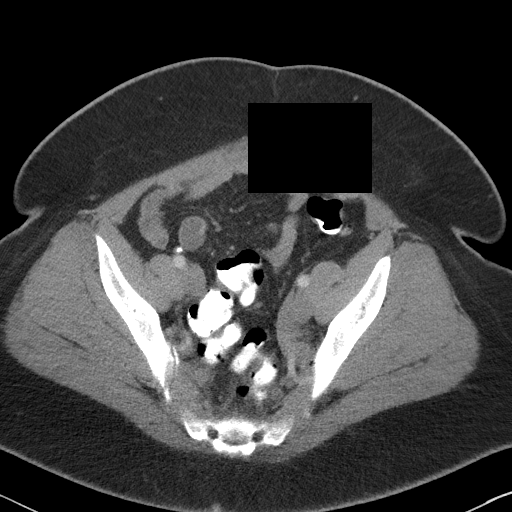
[im 30/85  soft-tissue]
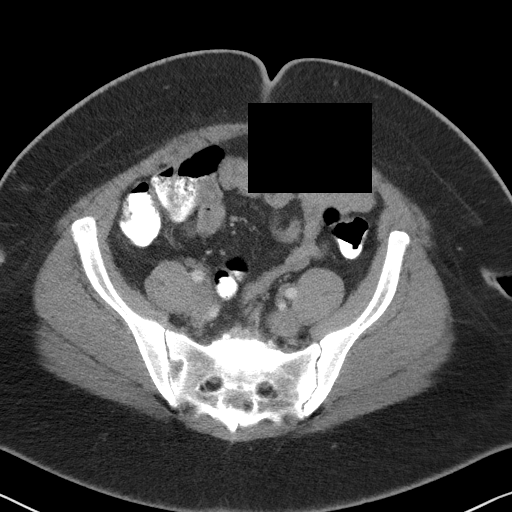
[im 36/85  soft-tissue]
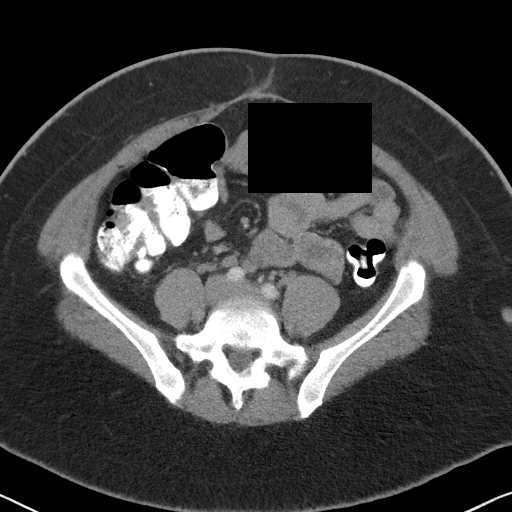
[im 44/85  soft-tissue]
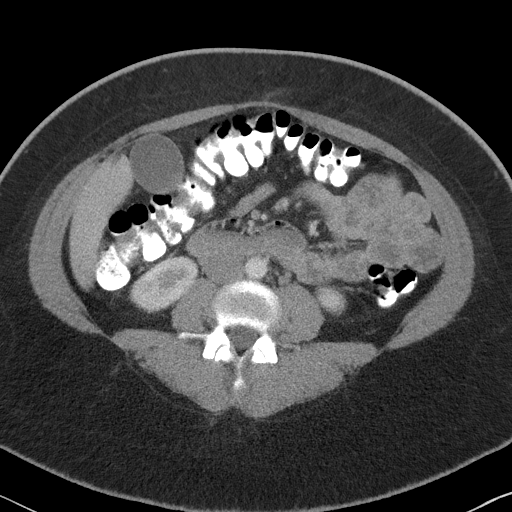
[im 49/85  soft-tissue]
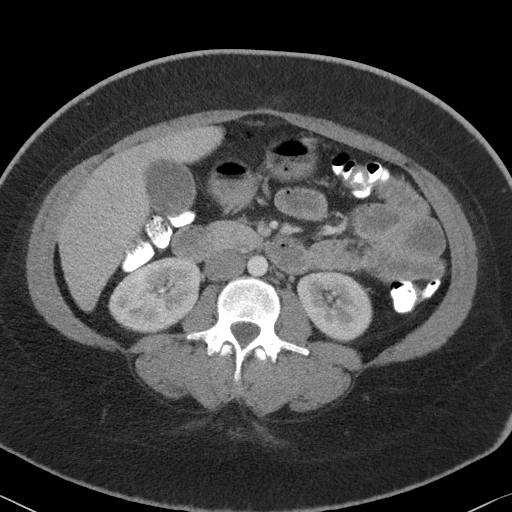
[im 55/85  soft-tissue]
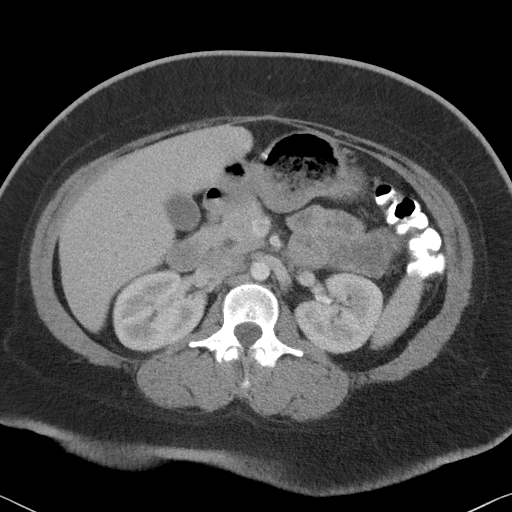
[im 55/85  bone]
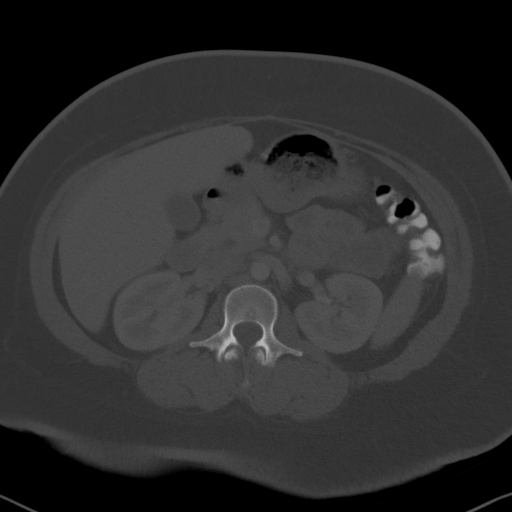
[im 60/85  soft-tissue]
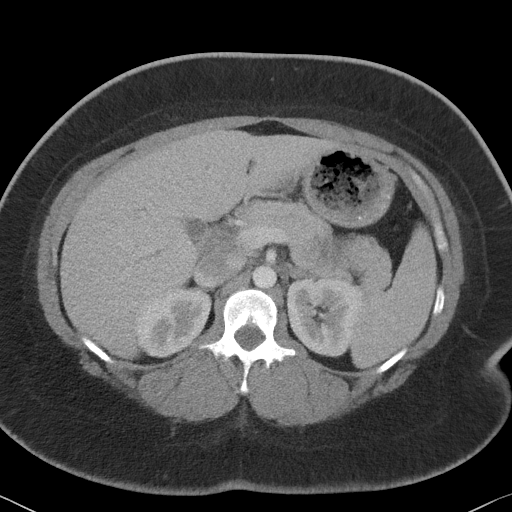
[im 68/85  soft-tissue]
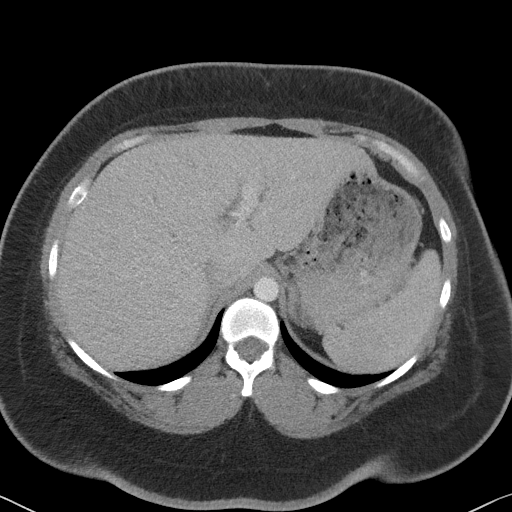
[im 74/85  soft-tissue]
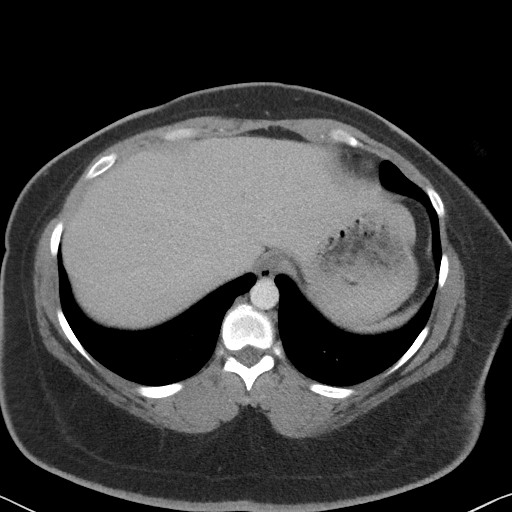
[im 79/85  soft-tissue]
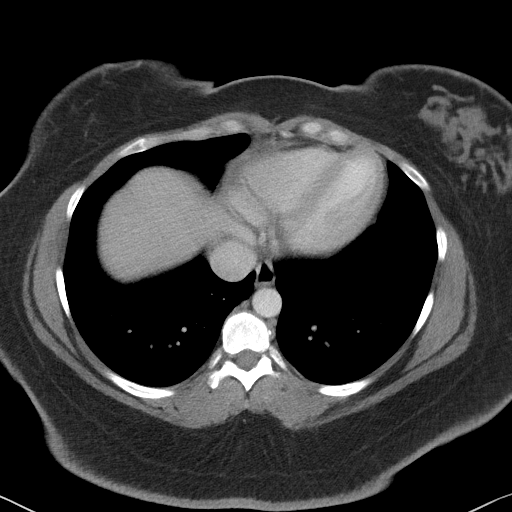

[Series 5: coronal st · coronal · 0.74mm/px · 3 of 105 slices shown]
[im 35/105  soft-tissue]
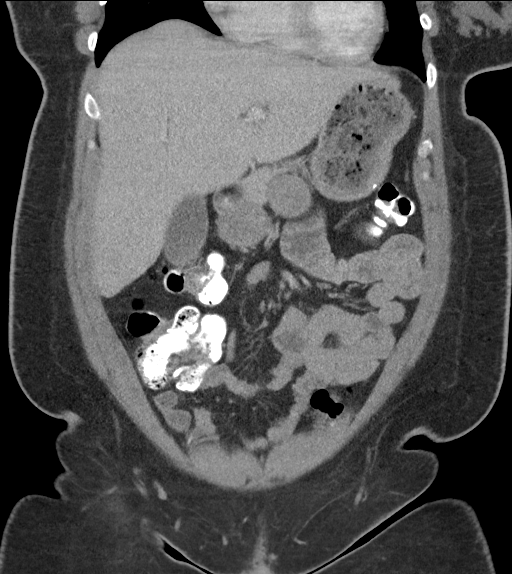
[im 47/105  soft-tissue]
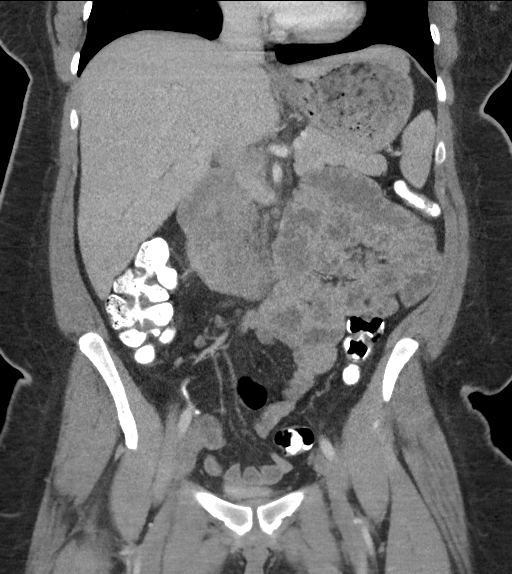
[im 58/105  soft-tissue]
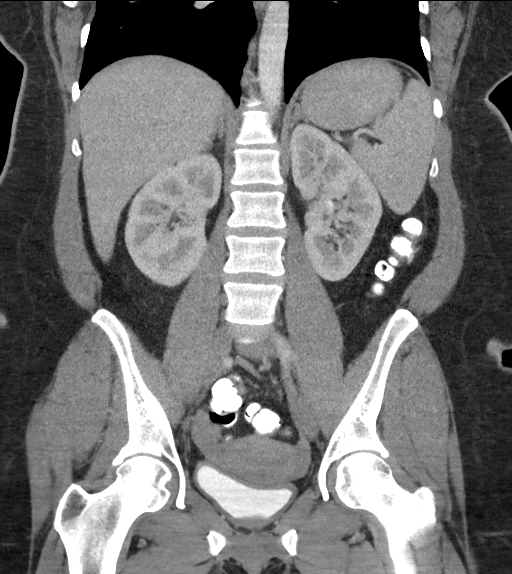

[16 of 46 positions shown; findings below may reference images not displayed]

FINDINGS: Lower chest: The lung bases are clear. No pleural fluid or
consolidation.

Hepatobiliary: The liver is prominent size spanning 20 cm
craniocaudal. No focal hepatic lesion. There is minimal periportal
edema. Gallbladder physiologically distended, no calcified stone. No
biliary dilatation.

Pancreas: No ductal dilatation or inflammation.

Spleen: Normal in size without focal abnormality.

Adrenals/Urinary Tract: Normal adrenal glands. Excreted contrast in
both renal collecting systems likely residual from CT earlier this
day. Homogeneous enhancement. No hydronephrosis or perinephric
edema. Physiologically distended. Excreted intravenous contrast from
prior CT. No bladder wall thickening.

Stomach/Bowel: Stomach physiologically distended. No bowel
obstruction or inflammation. Enteric contrast from prior CT is seen
throughout the colon. Normal appendix.

Vascular/Lymphatic: No significant vascular findings are present. No
enlarged abdominal or pelvic lymph nodes.

Reproductive: Uterus and bilateral adnexa are unremarkable.

Other: No free air, free fluid, or intra-abdominal fluid collection.

Musculoskeletal: There are no acute or suspicious osseous
abnormalities.
IMPRESSION: 1. Borderline mild hepatomegaly. Mild periportal edema, may be due
to hydration status. Given elevated LFTs, hepatitis could produce a
similar appearance.
2. Excreted intravenous contrast in the renal collecting systems and
urinary bladder from CT earlier this day.

## 2019-01-22 NOTE — Telephone Encounter (Signed)
Note sent to nurse. 

## 2019-03-08 ENCOUNTER — Other Ambulatory Visit: Payer: Self-pay

## 2019-03-08 ENCOUNTER — Ambulatory Visit (INDEPENDENT_AMBULATORY_CARE_PROVIDER_SITE_OTHER): Payer: 59 | Admitting: Allergy & Immunology

## 2019-03-08 ENCOUNTER — Encounter: Payer: Self-pay | Admitting: Allergy & Immunology

## 2019-03-08 VITALS — BP 118/82 | HR 77 | Temp 97.9°F | Resp 16 | Ht 64.57 in | Wt 280.2 lb

## 2019-03-08 DIAGNOSIS — J302 Other seasonal allergic rhinitis: Secondary | ICD-10-CM

## 2019-03-08 DIAGNOSIS — T7800XD Anaphylactic reaction due to unspecified food, subsequent encounter: Secondary | ICD-10-CM

## 2019-03-08 DIAGNOSIS — J3089 Other allergic rhinitis: Secondary | ICD-10-CM

## 2019-03-08 DIAGNOSIS — J4520 Mild intermittent asthma, uncomplicated: Secondary | ICD-10-CM

## 2019-03-08 MED ORDER — EPINEPHRINE 0.3 MG/0.3ML IJ SOAJ: 0.3000 mg | Freq: Once | INTRAMUSCULAR | 2 refills | Status: AC

## 2019-03-08 MED ORDER — ALBUTEROL SULFATE HFA 108 (90 BASE) MCG/ACT IN AERS: 2.0000 | INHALATION_SPRAY | RESPIRATORY_TRACT | 3 refills | Status: DC | PRN

## 2019-03-08 MED ORDER — LEVOCETIRIZINE DIHYDROCHLORIDE 5 MG PO TABS: 5.0000 mg | ORAL_TABLET | Freq: Every evening | ORAL | 5 refills | Status: DC

## 2019-03-08 NOTE — Progress Notes (Signed)
Pt here today for to get a new epipen for her peanut allergy.  Pt states that she tolerates tree nuts, has no issues with them.

## 2019-03-08 NOTE — Progress Notes (Signed)
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 Mild intermittent asthma, uncomplicated -Your symptoms are consistent with asthma. -It does not seem that a controller medication is needed at this time. -Continue with albuterol 2 puffs every 4-6 hours as needed.  2. Seasonal and perennial allergic rhinitis - Testing today showed: grasses, trees, outdoor molds and dust mites - Copy of test results provided. - Avoidance measures provided. - Stop taking: Claritin - Start taking: Xyzal (levocetirizine)  tablet once daily and Nasacort (triamcinolone) one spray per nostril daily - You can use an extra dose of the antihistamine, if needed, for breakthrough symptoms.  - Consider nasal saline rinses 1-2 times daily to remove allergens from the nasal cavities as well as help with mucous clearance (this is especially helpful to do before the nasal sprays are given) - Consider allergy shots as a means of long-term control. - Allergy shots "re-train" and "reset" the immune system to ignore environmental allergens and decrease the resulting immune response to those allergens (sneezing, itchy watery eyes, runny nose, nasal congestion, etc).    - Allergy shots improve symptoms in 75-85% of patients.  - We can discuss more at the next appointment if the medications are not working for you.  3. Anaphylactic shock due to food (peanuts) - Testing was very positive to peanut. Audry Riles prescription sent in. - They should call you in 1-2 days to confirm your shipping address. - Oral immunotherapy handout provided. - We will add you to the list.  4. Return in about 3 months (around 06/07/2019). This can be an in-person, a virtual Webex or a  telephone follow up visit.   Subjective:   Patricia [name] is a 28 y.o. female presenting today for evaluation of  Chief Complaint  Patient presents with   Food Intolerance    peanut allergy, tolerates tree nuts    Patricia [name] has a history of the following: Patient Active Problem List   Diagnosis Date Noted   Elevated LFTs 06/17/2017   Calculus of gallbladder with acute cholecystitis without obstruction    Nausea vomiting and diarrhea    RUQ abdominal pain    Transaminitis    Biliary colic    Gall stones, common bile duct    Abdominal pain 06/04/2017   Chronic hypertension complicating or reason for care during childbirth 04/13/2017   Chronic hypertension in pregnancy 04/13/2017   Chronic hypertension affecting pregnancy 11/08/2016   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  [name] was referred by Benita Stabile, MD.     Patricia [name] is a 28 y.o. female presenting for an evaluation of allergic rhinitis and peanut allergy.  She was last seen in September 2015 by Dr. Willa Rough, who has since left the practice.  At that time, she had strong reactivity to peanut and equivocal reactivity to almond.  Continued avoidance was recommended.  Since last visit, she has done very well.  However, her allergies are starting to get out of control again.  She would like to be retested and would like some more recommendations on how to treat her symptoms.   Asthma/Respiratory Symptom History: She does not have an  official diagnosis of asthma, but she does have albuterol and hand to use as needed.  She uses it 1 to 2 times per week during the spring, and then typically never picked it up the rest of the year.  She does feel relief with the albuterol.  Triggers for her wheezing and coughing include pregnancy, temperature changes, and exposure to the cold.  She did have an emergency room visit in 2017 for difficulty breathing, but otherwise has required no emergency room interventions.  She has not had prednisone for her symptoms in years.  She has never been intubated or hospitalized.  Allergic Rhinitis Symptom History: She does have a history of allergic rhinitis.  The spring season is certainly the worse.  She feels like her eyes are "packed with pollen".  She also endorses chronic congestion.  She has been on Claritin which worked for a while but then has stopped.  She was thinking about changing to Xyzal.  She has Flonase as she is in high school, but has not used anything since that time.  She does endorse a lot of ocular itching.  Food Allergy Symptom History: Her peanut allergy first became an issue when she was very young.  She starts to have a scratchy throat and then throat swelling.  She can react to even the sent to peanuts.  Both of her children have peanut allergy to, so she does not have any of this in the home at all.  She does not have an up-to-date epinephrine autoinjector whatsoever.  She does not remember the last time that she had an accidental exposure.  She eats tree nuts without any problems.  She does eat foods that have been processed and factories that might contain peanuts.  She otherwise tolerates all of the major food allergens without adverse event.  Eczema Symptom Symptom History: She does have some eczema on her hands, which is really gotten bad during the coronavirus pandemic.  She uses over-the-counter hydrocortisone.  This controls her symptoms fairly well.  Otherwise, there is no  history of other atopic diseases, including drug allergies, stinging insect allergies, urticaria or contact dermatitis. There is no significant infectious history. Vaccinations are up to date.    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   Supervision of high risk pregnancy, antepartum 09/27/2016   History of gestational hypertension 09/27/2016   History of shoulder dystocia in prior pregnancy, currently pregnant 09/27/2016   URI (upper respiratory infection) 08/01/2015   Persistent cough 08/01/2015   Obesity, Class III, BMI 40-49.9 (morbid obesity) (HCC) 09/23/2014   Headache 09/23/2014   PCO (polycystic ovaries) 03/08/2014    Medication List:  Allergies as of 03/08/2019      Reactions   Peanut-containing Drug Products Anaphylaxis, Hives   Propranolol Shortness Of Breath   Cephalexin Hives   Imitrex [sumatriptan] Hives      Medication List       Accurate as of March 08, 2019  1:39 PM. Always use your most recent med list.        albuterol 108 (90 Base) MCG/ACT inhaler Commonly known as:  PROVENTIL HFA;VENTOLIN HFA Inhale 2 puffs into the lungs every 4 (four) hours as needed for wheezing or shortness of breath.   Biotin 5000 MCG Caps Take 1 capsule by mouth daily.   butalbital-acetaminophen-caffeine 50-325-40 MG tablet Commonly known as:  FIORICET, ESGIC TAKE ONE TO TWO TABLETS BY MOUTH EVERY 6 HOURS AS NEEDED FOR HEADACHES   Complete  Natal DHA 29-1-200 & 250 MG Misc Take 1 tablet by mouth every evening.   EPINEPHrine 0.3 mg/0.3 mL Soaj injection Commonly known as:  Auvi-Q Inject 0.3 mLs (0.3 mg total) into the muscle once for 1 dose. As directed for life-threatening allergic reactions   levocetirizine 5 MG tablet Commonly known as:  XYZAL Take 1 tablet (5 mg total) by mouth every evening.   loratadine 10 MG tablet Commonly known as:  CLARITIN Take 10 mg by mouth daily as needed for allergies.   Nexplanon 68 MG Impl implant Generic drug:  etonogestrel 1 each by Subdermal route once.   PreviDent 5000 Booster Plus 1.1 % Pste Generic drug:  Sodium Fluoride Apply topically daily.       Birth History: non-contributory  Developmental History: non-contributory  Past Surgical History: Past Surgical History:  Procedure Laterality Date   CHOLECYSTECTOMY N/A 06/08/2017   Procedure: LAPAROSCOPIC CHOLECYSTECTOMY;  Surgeon: Franky MachoJenkins, Mark, MD;  Location: [facility];  Service: General;  Laterality: N/A;   TONSILLECTOMY     WISDOM TOOTH EXTRACTION       Family History: Family History  Problem Relation Age of Onset   Migraines Sister    Hypertension Brother    Heart disease Maternal Grandmother    Cancer Maternal Grandfather        prostate   Stroke Paternal Grandmother    Cancer Paternal Grandfather        prostate   Stroke Paternal Grandfather    Atrial fibrillation Father      Social History: [name] lives at home with her family.  They live in a house that is 29 years old.  There is hardwood with area rugs in the main living areas and hardwoods in the bedrooms.  They have gas heating and central cooling.  There is 1 outdoor dog.  She does not have dust mite covers on the bedding.  There is no tobacco exposure.  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    Review of Systems  Constitutional: Negative.  Negative for chills, fever,  malaise/fatigue and weight loss.  HENT: Positive for sore throat. Negative for congestion, ear discharge and ear pain.        Positive for postnasal drip.  Eyes: Negative for pain, discharge and redness.  Respiratory: Positive for cough. Negative for sputum production, shortness of breath and wheezing.   Cardiovascular: Negative.  Negative for chest pain and palpitations.  Gastrointestinal: Negative for abdominal pain, heartburn, nausea and vomiting.  Skin: Negative.  Negative for itching and rash.  Neurological: Negative for dizziness and headaches.  Endo/Heme/Allergies: Negative for environmental allergies. Bruises/bleeds easily.  Objective:   Blood pressure 118/82, pulse 77, temperature 97.9 F (36.6 C), temperature source Oral, resp. rate 16, height 5' 4.57" (1.64 m), weight 280 lb 3.2 oz (127.1 kg), SpO2 98 %, unknown if currently breastfeeding. Body mass index is 47.26 kg/m.   Physical Exam:   Physical Exam  Constitutional: She appears well-developed.  Pleasant female.  Interactive and talkative.  HENT:  Head: Normocephalic and atraumatic.  Right Ear: Tympanic membrane, external ear and ear canal normal. No drainage, swelling or tenderness. Tympanic membrane is not injected, not scarred, not erythematous, not retracted and not bulging.  Left Ear: Tympanic membrane, external ear and ear canal normal. No drainage, swelling or tenderness. Tympanic membrane is not injected, not scarred, not erythematous, not retracted and not bulging.  Nose: Mucosal edema and rhinorrhea present. No nasal deformity or septal deviation. No epistaxis. Right sinus exhibits no maxillary sinus tenderness and no frontal sinus tenderness. Left sinus exhibits no maxillary sinus tenderness and no frontal sinus tenderness.  Mouth/Throat: Uvula is midline and oropharynx is clear and moist. Mucous membranes are not pale and not dry.  Cobblestoning present in the posterior oropharynx.  No purulent  drainage.  No sinus pain or pressure. Tonsils 2+ bilaterally without exudates.   Eyes: Pupils are equal, round, and reactive to light. Conjunctivae and EOM are normal. Right eye exhibits no chemosis and no discharge. Left eye exhibits no chemosis and no discharge. Right conjunctiva is not injected. Left conjunctiva is not injected.  Cardiovascular: Normal rate, regular rhythm and normal heart sounds.  Respiratory: Effort normal and breath sounds normal. No accessory muscle usage. No tachypnea. No respiratory distress. She has no wheezes. She has no rhonchi. She has no rales. She exhibits no tenderness.  Moving air well in all lung fields.  GI: There is no abdominal tenderness. There is no rebound and no guarding.  Lymphadenopathy:       Head (right side): No submandibular, no tonsillar and no occipital adenopathy present.       Head (left side): No submandibular, no tonsillar and no occipital adenopathy present.    She has cervical adenopathy.  Shotty cervical adenopathy present bilaterally in the anterior chains.  Neurological: She is alert.  Skin: No abrasion, no petechiae and no rash noted. Rash is not papular, not vesicular and not urticarial. No erythema. No pallor.  No eczematous or urticarial lesions noted.  Psychiatric: She has a normal mood and affect.     Diagnostic studies:     Allergy Studies:    Airborne Adult Perc - 03/08/19 0942    Time Antigen Placed  1610    Allergen Manufacturer  Waynette Buttery    Location  Back    Number of Test  59    1. Control-Buffer 50% Glycerol  Negative    2. Control-Histamine 1 mg/ml  2+    3. Albumin saline  Negative    4. Bahia  Negative    5. French [place]  Negative    6. Johnson  Negative    7. Kentucky Blue  2+    8. Meadow Fescue  2+    9. Perennial Rye  3+    10. Sweet Vernal  2+    11. Timothy  2+    12. Cocklebur  Negative    13. Burweed Marshelder  Negative    14. Ragweed, short  Negative    15. Ragweed, Giant  Negative    16. Plantain,   English  Negative    17. Lamb's  Quarters  Negative    18. Sheep Sorrell  Negative    19. Rough Pigweed  Negative    20. Marsh Elder, Rough  Negative    21. Mugwort, Common  Negative    22. Ash mix  2+    23. Birch mix  2+    24. Beech American  2+    25. Box, Elder  2+    26. Cedar, red  Negative    27. Cottonwood, Guinea-Bissau  Negative    28. Elm mix  Negative    29. Hickory mix  3+    30. Maple mix  3+    31. Oak, Guinea-Bissau mix  Negative    32. Pecan Pollen  4+    33. Pine mix  3+    34. Sycamore Eastern  Negative    35. Walnut, Black Pollen  2+    36. Alternaria alternata  Negative    37. Cladosporium Herbarum  Negative    38. Aspergillus mix  Negative    39. Penicillium mix  Negative    40. Bipolaris sorokiniana (Helminthosporium)  2+    41. Drechslera spicifera (Curvularia)  Negative    42. Mucor plumbeus  Negative    43. Fusarium moniliforme  Negative    44. Aureobasidium pullulans (pullulara)  Negative    45. Rhizopus oryzae  Negative    46. Botrytis cinera  Negative    47. Epicoccum nigrum  Negative    48. Phoma betae  Negative    49. Candida Albicans  Negative    50. Trichophyton mentagrophytes  Negative    51. Mite, D Farinae  5,000 AU/ml  4+    52. Mite, D Pteronyssinus  5,000 AU/ml  4+    53. Cat Hair 10,000 BAU/ml  Negative    54.  Dog Epithelia  Negative    55. Mixed Feathers  Negative    56. Horse Epithelia  Negative    57. Cockroach, German  Negative    58. Mouse  Negative    59. Tobacco Leaf  Negative     Food Adult Perc - 03/08/19 0900    Time Antigen Placed  1610    Allergen Manufacturer  Waynette Buttery    Location  Back    Number of allergen test  1    Control-Histamine 1 mg/ml  2+    1. Peanut  --   10x19      Allergy testing results were read and interpreted by myself, documented by clinical staff.         [name], MD [facility]

## 2019-03-08 NOTE — Patient Instructions (Addendum)
1. Mild intermittent asthma, uncomplicated -Your symptoms are consistent with asthma. -It does not seem that a controller medication is needed at this time. -Continue with albuterol 2 puffs every 4-6 hours as needed.  2. Seasonal and perennial allergic rhinitis - Testing today showed: grasses, trees, outdoor molds and dust mites - Copy of test results provided. - Avoidance measures provided. - Stop taking: Claritin - Start taking: Xyzal (levocetirizine) 5mg  tablet once daily and Nasacort (triamcinolone) one spray per nostril daily - You can use an extra dose of the antihistamine, if needed, for breakthrough symptoms.  - Consider nasal saline rinses 1-2 times daily to remove allergens from the nasal cavities as well as help with mucous clearance (this is especially helpful to do before the nasal sprays are given) - Consider allergy shots as a means of long-term control. - Allergy shots "re-train" and "reset" the immune system to ignore environmental allergens and decrease the resulting immune response to those allergens (sneezing, itchy watery eyes, runny nose, nasal congestion, etc).    - Allergy shots improve symptoms in 75-85% of patients.  - We can discuss more at the next appointment if the medications are not working for you.  3. Anaphylactic shock due to food (peanuts) - Testing was very positive to peanut. Audry Riles prescription sent in. - They should call you in 1-2 days to confirm your shipping address. - Oral immunotherapy handout provided. - We will add you to the list.  4. Return in about 3 months (around 06/07/2019). This can be an in-person, a virtual Webex or a telephone follow up visit.   Please inform us of any Emergency Department visits, hospitalizations, or changes in symptoms. Call us before going to the ED for breathing or allergy symptoms since we might be able to fit you in for a sick visit. Feel free to contact us anytime with any questions, problems, or concerns.   It was a pleasure to see you again today!  Websites that have reliable patient information: 1. American Academy of Asthma, Allergy, and Immunology: www.aaaai.org 2. Food Allergy Research and Education (FARE): foodallergy.org 3. Mothers of Asthmatics: http://www.asthmacommunitynetwork.org 4. American College of Allergy, Asthma, and Immunology: www.acaai.org  "Like" [place] on Facebook and Instagram for our latest updates!      Make sure you are registered to vote! If you have moved or changed any of your contact information, you will need to get this updated before voting!    Voter ID laws are NOT going into effect for the General Election in November 2020! DO NOT let this stop you from exercising your right to vote!

## 2019-05-31 ENCOUNTER — Ambulatory Visit: Payer: 59 | Admitting: Allergy & Immunology

## 2019-06-08 ENCOUNTER — Ambulatory Visit (INDEPENDENT_AMBULATORY_CARE_PROVIDER_SITE_OTHER): Payer: 59 | Admitting: Allergy & Immunology

## 2019-06-08 ENCOUNTER — Ambulatory Visit: Payer: 59 | Admitting: Allergy & Immunology

## 2019-06-08 ENCOUNTER — Encounter: Payer: Self-pay | Admitting: Allergy & Immunology

## 2019-06-08 DIAGNOSIS — J302 Other seasonal allergic rhinitis: Secondary | ICD-10-CM | POA: Diagnosis not present

## 2019-06-08 DIAGNOSIS — J3089 Other allergic rhinitis: Secondary | ICD-10-CM

## 2019-06-08 DIAGNOSIS — T7800XD Anaphylactic reaction due to unspecified food, subsequent encounter: Secondary | ICD-10-CM | POA: Diagnosis not present

## 2019-06-08 DIAGNOSIS — J4520 Mild intermittent asthma, uncomplicated: Secondary | ICD-10-CM | POA: Diagnosis not present

## 2019-06-08 NOTE — Patient Instructions (Addendum)
1. Mild intermittent asthma, uncomplicated - It seems that you are doing fine with albuterol as needed.  -It does not seem that a controller medication is needed at this time. -Continue with albuterol 2 puffs every 4-6 hours as needed.  2. Seasonal and perennial allergic rhinitis (grasses, trees, outdoor molds and dust mites) - Continue taking: Xyzal (levocetirizine) 5mg  tablet once daily and Nasacort (triamcinolone) one spray per nostril daily - You can use an extra dose of the antihistamine, if needed, for breakthrough symptoms.  - Consider nasal saline rinses 1-2 times daily to remove allergens from the nasal cavities as well as help with mucous clearance (this is especially helpful to do before the nasal sprays are given) - Consider allergy shots as a means of long-term control.  3. Anaphylactic shock due to food (peanuts) - AuviQ up to date.  - Oral immunotherapy handout provided. - We will add you to the list.  4. Return in about 1 year (around 06/07/2020). This can be an in-person, a virtual Webex or a telephone follow up visit.   Please inform us of any Emergency Department visits, hospitalizations, or changes in symptoms. Call us before going to the ED for breathing or allergy symptoms since we might be able to fit you in for a sick visit. Feel free to contact us anytime with any questions, problems, or concerns.  It was a pleasure to see you again today!  Websites that have reliable patient information: 1. American Academy of Asthma, Allergy, and Immunology: www.aaaai.org 2. Food Allergy Research and Education (FARE): foodallergy.org 3. Mothers of Asthmatics: http://www.asthmacommunitynetwork.org 4. American College of Allergy, Asthma, and Immunology: www.acaai.org  "Like" [place] on Facebook and Instagram for our latest updates!      Make sure you are registered to vote! If you have moved or changed any of your contact information, you will need to get this updated before  voting!    Voter ID laws are NOT going into effect for the General Election in November 2020! DO NOT let this stop you from exercising your right to vote!

## 2019-06-08 NOTE — Progress Notes (Signed)
RE: Patricia [name] MRN: [id number] DOB: 1991-05-12 Date of Telemedicine Visit: 06/08/2019  Referring provider: Celene Squibb, MD Primary care provider: Celene Squibb, MD  Chief Complaint: Follow-up   Telemedicine Follow Up Visit via Telephone: I connected with Patricia [name] for a follow up on 06/08/19 by telephone and verified that I am speaking with the correct person using two identifiers.   I discussed the limitations, risks, security and privacy concerns of performing an evaluation and management service by telephone and the availability of in person appointments. I also discussed with the patient that there may be a patient responsible charge related to this service. The patient expressed understanding and agreed to proceed.  Patient is at home accompanied.  Provider is at the office.  Visit start time: 9:54 AM Visit end time: 10:27 AM Insurance consent/check in by: [facility] consent and medical assistant/nurse: Ashleigh  History of Present Illness:  She is a 28 y.o. female, who is being followed for asthma as well as allergies including food and environmental allergies. Her previous allergy office visit was in April 2020 with myself.  At the last visit, we continued albuterol 2 puffs every 4-6 hours as needed.  He had testing that was positive to grasses, trees, outdoor molds, and dust mites.  We stopped her Claritin and started Xyzal 5 mg daily and Nasacort 1 spray per nostril daily.  We did discuss allergen immunotherapy as a means of long-term control.  She did have testing that was very positive to peanut.  We prescribed her an Auvi-Q epinephrine autoinjector.  We did discuss oral immunotherapy as well.  Since the last visit, she has done well.   Asthma/Respiratory Symptom History: She has not had any issues at all with regards to her breathing. She is not using her rescue inhaler ever. She denies coughing at night. Weather changes are the worst times of the year for her.    Allergic Rhinitis Symptom History: She is using the Xyzal daily. She is on the Nasacort and is somewhat regular with it. She does not want to NOT take it and suffer the symptoms. She is controlled with this regimen. She has not needed antibiotics for sinus infections at all.  Food Allergy Symptom History: She has avoided peanut exposure. She did receive the AuviQ in the mail.  Otherwise, there have been no changes to her past medical history, surgical history, family history, or social history. She is now in a different role with [employer].   Assessment and Plan:  Patricia [name] is a 28 y.o. 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 Mild intermittent asthma, uncomplicated - It seems that you are doing fine with albuterol as needed.  -It does not seem that a controller medication is needed at this time. -Continue with albuterol 2 puffs every 4-6 hours as needed.  2. Seasonal and perennial allergic rhinitis (grasses, trees, outdoor molds and dust mites) - Continue taking: Xyzal (levocetirizine) 5mg  tablet once daily and Nasacort (triamcinolone) one spray per nostril daily - You can use an extra dose of the antihistamine, if needed, for breakthrough symptoms.  - Consider nasal saline rinses 1-2 times daily to remove allergens from the nasal cavities as well as help with mucous clearance (this is especially helpful to do before the nasal sprays are given) - Consider allergy shots as a means of  long-term control.  3. Anaphylactic shock due to food (peanuts) - AuviQ up to date.  - Oral immunotherapy handout provided. - We will add you to the list.  4. Return in about 1 year (around 06/07/2020). This can be an in-person, a virtual Webex or a telephone follow up visit.   Diagnostics: None.  Medication List:  Current Outpatient  Medications  Medication Sig Dispense Refill  . albuterol (PROVENTIL HFA;VENTOLIN HFA) 108 (90 Base) MCG/ACT inhaler Inhale 2 puffs into the lungs every 4 (four) hours as needed for wheezing or shortness of breath. 1 Inhaler 3  . Biotin 5000 MCG CAPS Take 1 capsule by mouth daily.    Marland Kitchen. etonogestrel (NEXPLANON) 68 MG IMPL implant 1 each by Subdermal route once.    Marland Kitchen. levocetirizine (XYZAL) 5 MG tablet Take 1 tablet (5 mg total) by mouth every evening. 30 tablet 5  . PREVIDENT 5000 BOOSTER PLUS 1.1 % PSTE Apply topically daily.   1   No current facility-administered medications for this visit.    Allergies: Allergies  Allergen Reactions  . Peanut-Containing Drug Products Anaphylaxis and Hives  . Propranolol Shortness Of Breath  . Cephalexin Hives  . Imitrex [Sumatriptan] Hives   I reviewed her past medical history, social history, family history, and environmental history and no significant changes have been reported from previous visits.  Review of Systems  Constitutional: Negative for activity change, appetite change, chills and fever.  HENT: Negative for congestion, postnasal drip, rhinorrhea, sinus pressure, sinus pain and sore throat.   Eyes: Negative for pain, discharge, redness and itching.  Respiratory: Negative for cough, choking, shortness of breath, wheezing and stridor.   Gastrointestinal: Negative for diarrhea, nausea and vomiting.  Musculoskeletal: Negative for arthralgias, joint swelling and myalgias.  Skin: Negative for rash.  Allergic/Immunologic: Negative for environmental allergies and food allergies.    Objective:  Physical exam not obtained as encounter was done via telephone.   Previous notes and tests were reviewed.  I discussed the assessment and treatment plan with the patient. The patient was provided an opportunity to ask questions and all were answered. The patient agreed with the plan and demonstrated an understanding of the instructions.   The patient  was advised to call back or seek an in-person evaluation if the symptoms worsen or if the condition fails to improve as anticipated.  I provided 33 minutes of non-face-to-face time during this encounter.  It was my pleasure to participate in [place]'s care today. Please feel free to contact me with any questions or concerns.   Sincerely,  Patricia SpruceJoel Louis Gwendolyn Mclees, MD

## 2019-08-10 ENCOUNTER — Other Ambulatory Visit: Payer: Self-pay

## 2019-08-10 DIAGNOSIS — Z20822 Contact with and (suspected) exposure to covid-19: Secondary | ICD-10-CM

## 2019-08-11 LAB — NOVEL CORONAVIRUS, NAA: SARS-CoV-2, NAA: NOT DETECTED

## 2019-09-26 NOTE — Progress Notes (Signed)
Referring Provider: Celene Squibb, MD Primary Care Physician:  Celene Squibb, MD Primary GI Physician: Dr. Oneida Alar  Chief Complaint  Patient presents with  . Abdominal Pain    ruq  . Nausea  . Diarrhea    HPI:   Patricia [name] is a 28 y.o. female presenting today for RUQ pain s/p cholecystectomy in 2018 and diarrhea.   Patient was last seen in our office on 06/17/2017 for hospital follow-up.  She had recently been seen in patient due to elevated LFTs with cholelithiasis and dilated CBD.  MRCP negative for choledocholithiasis.  Suspected elevated LFTs likely secondary to passing a stone.  She had undergone cholecystectomy while inpatient.  At her follow-up, she was doing well without abdominal pain, nausea, or vomiting.  Her most recent LFTs were much improved with slight elevation of ALT 79 and alk phos 142.  Otherwise normal.  Also reported loose stool post cholecystectomy if eating the wrong thing.  Plans to repeat LFTs and return as needed.  Repeat HFP on 06/25/2017 with HFP normal.  Today she states she has had diarrhea since her cholecystectomy. Started to have RUQ pain that felt like it did prior to cholecystectomy after eating. Started last week. Daily. Prior to pain onset, she will get nauseated. More of a feeling like she needs to eat something. Resolves after eating, but then the pain starts. No vomiting. Pain last most of the day, other times will start in the evening and will go to bed and wake up without pain. About 7/10 in severity. Goes between sharp and dull. No specific triggers. Non-radiating. No pain at this time, but she hasn't eaten today.   No GERD or heartburn. No epigastric pain. No other abdominal pain. No NSAIDs. No unintentional weight loss.   Diarrhea: Started after cholecystectomy and has not changed. Postprandial. Within 30 minutes. Watery and greasy. Has tried imodium, this helped some. Also tried digestive enzymes. Having about 3 BMs daily. No nocturnal stool. No  blood in the stool. No black stools. No antibiotics. Has tried decreasing fried/fatty foods without improvement. Prior, stools were 2-3 a day but soft and formed. Now with associated urgency.   No fever, chills, yellowing of skin or confusion. Lightheadedness, dizziness, or feeling like she will pass out. No chest pain. No heart palpitation. No shortness of breath or cough.   OTC supplements: Multivitamin, biotin, sea moss. No tylenol. No alcohol. No drug use.     Past Medical History:  Diagnosis Date  . Asthma   . Cough 08/01/2015  . Migraine   . Migraines   . Nausea and vomiting during pregnancy prior to [redacted] weeks gestation 08/01/2015  . PCO (polycystic ovaries) 03/08/2014  . Polycystic disease, ovaries   . Pregnancy induced hypertension   . Pregnant 05/27/2015  . URI (upper respiratory infection) 08/01/2015    Past Surgical History:  Procedure Laterality Date  . CHOLECYSTECTOMY N/A 06/08/2017   Procedure: LAPAROSCOPIC CHOLECYSTECTOMY;  Surgeon: Aviva Signs, MD;  Location: [facility];  Service: General;  Laterality: N/A;  . TONSILLECTOMY    . WISDOM TOOTH EXTRACTION      Current Outpatient Medications  Medication Sig Dispense Refill  . albuterol (PROVENTIL HFA;VENTOLIN HFA) 108 (90 Base) MCG/ACT inhaler Inhale 2 puffs into the lungs every 4 (four) hours as needed for wheezing or shortness of breath. 1 Inhaler 3  . Biotin 5000 MCG CAPS Take 1 capsule by mouth daily.    . CONCERTA 36 MG CR tablet Take  36 mg by mouth daily.    Marland Kitchen etonogestrel (NEXPLANON) 68 MG IMPL implant 1 each by Subdermal route once.    Marland Kitchen levocetirizine (XYZAL) 5 MG tablet Take 1 tablet (5 mg total) by mouth every evening. 30 tablet 5  . PREVIDENT 5000 BOOSTER PLUS 1.1 % PSTE Apply topically daily.   1   No current facility-administered medications for this visit.     Allergies as of 09/27/2019 - Review Complete 09/27/2019  Allergen Reaction Noted  . Peanut-containing drug products Anaphylaxis and Hives 04/27/2012  .  Propranolol Shortness Of Breath 09/23/2014  . Cephalexin Hives 12/22/2011  . Imitrex [sumatriptan] Hives 10/12/2013    Family History  Problem Relation Age of Onset  . Migraines Sister   . Hypertension Brother   . Heart disease Maternal Grandmother   . Cancer Maternal Grandfather        prostate  . Stroke Paternal Grandmother   . Cancer Paternal Grandfather        prostate  . Stroke Paternal Grandfather   . Atrial fibrillation Father   . Colon cancer Neg Hx     Social History   Socioeconomic History  . Marital status: Single    Spouse name: Not on file  . Number of children: Not on file  . Years of education: Not on file  . Highest education level: Not on file  Occupational History  . Not on file  Social Needs  . Financial resource strain: Not on file  . Food insecurity    Worry: Not on file    Inability: Not on file  . Transportation needs    Medical: Not on file    Non-medical: Not on file  Tobacco Use  . Smoking status: Never Smoker  . Smokeless tobacco: Never Used  Substance and Sexual Activity  . Alcohol use: No    Comment: occassional  . Drug use: No  . Sexual activity: Not Currently    Birth control/protection: None  Lifestyle  . Physical activity    Days per week: Not on file    Minutes per session: Not on file  . Stress: Not on file  Relationships  . Social Herbalist on phone: Not on file    Gets together: Not on file    Attends religious service: Not on file    Active member of club or organization: Not on file    Attends meetings of clubs or organizations: Not on file    Relationship status: Not on file  Other Topics Concern  . Not on file  Social History Narrative  . Not on file    Review of Systems: Gen: See HPI CV: See HPI Resp: See HPI GI: See HPI Derm: Reports eczema that comes and goes. On her left hand at this time.  Psych: Denies depression, anxiety Heme: Denies bruising, bleeding  Physical Exam: BP (!) 155/98    Pulse 74   Temp (!) 96.2 F (35.7 C) (Temporal)   Ht _0  (1.626 m)   Wt 298 lb 9.6 oz (135.4 kg)   BMI 51.25 kg/m  General:   Alert and oriented. No distress noted. Pleasant and cooperative.  Head:  Normocephalic and atraumatic. Eyes:  Conjuctiva clear without scleral icterus. Heart:  S1, S2 present without murmurs appreciated. Lungs:  Clear to auscultation bilaterally. No wheezes, rales, or rhonchi. No distress.  Abdomen:  +BS, soft, non-tender and non-distended. No rebound or guarding. No HSM or masses noted. Msk:  Symmetrical without  gross deformities. Normal posture. Extremities:  Without edema. Neurologic:  Alert and  oriented x4 Psych:  Normal mood and affect.

## 2019-09-27 ENCOUNTER — Encounter: Payer: Self-pay | Admitting: Allergy & Immunology

## 2019-09-27 ENCOUNTER — Ambulatory Visit (INDEPENDENT_AMBULATORY_CARE_PROVIDER_SITE_OTHER): Payer: 59 | Admitting: Allergy & Immunology

## 2019-09-27 ENCOUNTER — Ambulatory Visit (INDEPENDENT_AMBULATORY_CARE_PROVIDER_SITE_OTHER): Payer: 59 | Admitting: Gastroenterology

## 2019-09-27 ENCOUNTER — Encounter: Payer: Self-pay | Admitting: *Deleted

## 2019-09-27 ENCOUNTER — Other Ambulatory Visit: Payer: Self-pay

## 2019-09-27 ENCOUNTER — Encounter: Payer: Self-pay | Admitting: Gastroenterology

## 2019-09-27 VITALS — BP 155/98 | HR 74 | Temp 96.2°F | Ht 64.0 in | Wt 298.6 lb

## 2019-09-27 DIAGNOSIS — J3089 Other allergic rhinitis: Secondary | ICD-10-CM

## 2019-09-27 DIAGNOSIS — J4520 Mild intermittent asthma, uncomplicated: Secondary | ICD-10-CM | POA: Diagnosis not present

## 2019-09-27 DIAGNOSIS — R197 Diarrhea, unspecified: Secondary | ICD-10-CM | POA: Diagnosis not present

## 2019-09-27 DIAGNOSIS — T7800XD Anaphylactic reaction due to unspecified food, subsequent encounter: Secondary | ICD-10-CM | POA: Diagnosis not present

## 2019-09-27 DIAGNOSIS — L2089 Other atopic dermatitis: Secondary | ICD-10-CM

## 2019-09-27 DIAGNOSIS — R1011 Right upper quadrant pain: Secondary | ICD-10-CM | POA: Diagnosis not present

## 2019-09-27 DIAGNOSIS — J302 Other seasonal allergic rhinitis: Secondary | ICD-10-CM

## 2019-09-27 MED ORDER — TRIAMCINOLONE ACETONIDE 0.1 % EX OINT: 1.0000 "application " | TOPICAL_OINTMENT | Freq: Two times a day (BID) | CUTANEOUS | 3 refills | Status: DC | PRN

## 2019-09-27 NOTE — Assessment & Plan Note (Addendum)
28 y.o. female with 3 postprandial watery stools with urgency present since cholecystectomy in 2018. Prior to cholecystectomy, patient had 2-3 soft formed BMs daily. Postprandial diarrhea has persisted without change. No bright red blood per rectum or melena. No nocturnal stools. No unintentional weight loss. Imodium helps some. 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   Update CBC and CMP. Will likely start [place] following lab results.  Follow-up in 2 months.

## 2019-09-27 NOTE — Patient Instructions (Signed)
Please have labs and [place] completed. We will have further recommendations to follow.   I will likely start you on Questran for your diarrhea after we get your labs back.   We will plan to see you back in 2 months for follow-up. Call if you have questions or concerns prior. If you have acute onset of pain that doesn't resolve or is associated with nausea/vomiting, fever, or chills you should go to the emergency room.   Aliene Altes, PA-C [facility]

## 2019-09-27 NOTE — Patient Instructions (Signed)
1. Mild intermittent asthma, uncomplicated - It seems that you are doing fine with albuterol as needed.  -It does not seem that a controller medication is needed at this time. -Continue with albuterol 2 puffs every 4-6 hours as needed.  2. Seasonal and perennial allergic rhinitis (grasses, trees, outdoor molds and dust mites) - Continue taking: Xyzal (levocetirizine) 5mg  tablet once daily and Nasacort (triamcinolone) one spray per nostril daily - You can use an extra dose of the antihistamine, if needed, for breakthrough symptoms.  - Consider nasal saline rinses 1-2 times daily to remove allergens from the nasal cavities as well as help with mucous clearance (this is especially helpful to do before the nasal sprays are given) - Consider allergy shots as a means of long-term control.  3. Anaphylactic shock due to food (peanuts) - AuviQ up to date.  - Oral immunotherapy handout provided. - We will add you to the list.  4. Eczema - We   4. No follow-ups on file. This can be an in-person, a virtual Webex or a telephone follow up visit.   Please inform us of any Emergency Department visits, hospitalizations, or changes in symptoms. Call us before going to the ED for breathing or allergy symptoms since we might be able to fit you in for a sick visit. Feel free to contact us anytime with any questions, problems, or concerns.  It was a pleasure to see you again today!  Websites that have reliable patient information: 1. American Academy of Asthma, Allergy, and Immunology: www.aaaai.org 2. Food Allergy Research and Education (FARE): foodallergy.org 3. Mothers of Asthmatics: http://www.asthmacommunitynetwork.org 4. American College of Allergy, Asthma, and Immunology: www.acaai.org  "Like" [place] on Facebook and Instagram for our latest updates!      Make sure you are registered to vote! If you have moved or changed any of your contact information, you will need to get this updated before  voting!    Voter ID laws are NOT going into effect for the General Election in November 2020! DO NOT let this stop you from exercising your right to vote!

## 2019-09-27 NOTE — Progress Notes (Addendum)
RE: Patricia [name] MRN: [id number] DOB: February 14, 1991 Date of Telemedicine Visit: 09/27/2019  Referring provider: Celene Squibb, MD Primary care provider: Celene Squibb, MD  Chief Complaint: Asthma   Telemedicine Follow Up Visit via Telephone: I connected with Patricia [name] for a follow up on 09/27/19 by telephone and verified that I am speaking with the correct person using two identifiers.   I discussed the limitations, risks, security and privacy concerns of performing an evaluation and management service by telephone and the availability of in person appointments. I also discussed with the patient that there may be a patient responsible charge related to this service. The patient expressed understanding and agreed to proceed.  Patient is at home.  Provider is at the office.  Visit start time: 5:00 PM Visit end time: 5:25 PM Insurance consent/check in by: Patricia [name] consent and medical assistant/nurse: Patricia [name]  History of Present Illness:  She is a 28 y.o. female, who is being followed for a multitude of atopic complaints. Her previous allergy office visit was in July 2020 with myself.  She was last seen in July 2020 via televisit.  At that time, her asthma was under good control with albuterol.  For her allergic rhinitis, we continued Xyzal as well as Nasacort.  We recommended continued avoidance of peanuts due to her history of anaphylaxis.  Since the last visit, she has done well. She has been working mostly from home and in fact never goes into the office at all. She has been at home for two years now even before [facility].   Asthma/Respiratory Symptom History: She remains well controlled for an asthma perspective. The last time that she needed it was spring time. She also problems when winter approaches. Typically she has problesm around Christmas time. She has not needed prednisone at all. ACT is 25 indicating excellent asthma control.   Allergic Rhinitis Symptom History: Allergy  symptoms are well controlled with Nasacort and Xyal. She has not needed it very much at all. She is trying to wean off of medications. She has not needed any antibiotics at all.   Food Allergy Symptom History: She continues to avoid peanuts without a problems. She has had no accidental exposures. She was last tested in April 2020. She is interested in OIT and wants to discuss this today.    Eczema Symptom Symptom History: She has been using OTC hydrocortisone as well as a wet/dry dressing. She has a lesion on her hand which has not improved. She has been trying some topical steroids with some resolution. She has tried [place] in the past without improvement.   Otherwise, there have been no changes to her past medical history, surgical history, family history, or social history.  Assessment and Plan:  Patricia [name] is a 28 y.o. 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 presents for follow-up visit.  Specifically when she has been discussing oral therapy.  We did discuss the process as well as the risk and benefits.  We also discussed expectations.  She is not interested in eating peanut ad lib., but rather wants to prevent cross-contamination issues.  She will check with her insurance company regarding co-pays and I will email her with additional information regarding the process itself.  She does have eczema, so we did look into getting Dupixent approved to treat her  eczema and use in conjunction with the oral therapy.  Diagnostics: None.  Medication List:  Current Outpatient Medications  Medication Sig Dispense Refill  . albuterol (PROVENTIL HFA;VENTOLIN HFA) 108 (90 Base) MCG/ACT inhaler Inhale 2 puffs into the lungs every 4 (four) hours as needed for wheezing or shortness of breath. 1 Inhaler 3  .  Biotin 5000 MCG CAPS Take 1 capsule by mouth daily.    . CONCERTA 36 MG CR tablet Take 36 mg by mouth daily.    Marland Kitchen etonogestrel (NEXPLANON) 68 MG IMPL implant 1 each by Subdermal route once.    Marland Kitchen levocetirizine (XYZAL) 5 MG tablet Take 1 tablet (5 mg total) by mouth every evening. 30 tablet 5  . PREVIDENT 5000 BOOSTER PLUS 1.1 % PSTE Apply topically daily.   1   No current facility-administered medications for this visit.    Allergies: Allergies  Allergen Reactions  . Peanut-Containing Drug Products Anaphylaxis and Hives  . Propranolol Shortness Of Breath  . Cephalexin Hives  . Imitrex [Sumatriptan] Hives   I reviewed her past medical history, social history, family history, and environmental history and no significant changes have been reported from previous visits.  Review of Systems  Constitutional: Negative for activity change, appetite change, chills, fatigue and fever.  HENT: Negative for congestion, postnasal drip, rhinorrhea, sinus pressure and sore throat.   Eyes: Negative for pain, discharge, redness and itching.  Respiratory: Negative for shortness of breath, wheezing and stridor.   Gastrointestinal: Negative for diarrhea, nausea and vomiting.  Musculoskeletal: Negative for arthralgias, joint swelling and myalgias.  Skin: Negative for rash.  Allergic/Immunologic: Positive for environmental allergies and food allergies.    Objective:  Physical exam not obtained as encounter was done via telephone.   Previous notes and tests were reviewed.  I discussed the assessment and treatment plan with the patient. The patient was provided an opportunity to ask questions and all were answered. The patient agreed with the plan and demonstrated an understanding of the instructions.   The patient was advised to call back or seek an in-person evaluation if the symptoms worsen or if the condition fails to improve as anticipated.  I provided 25 minutes of non-face-to-face time during  this encounter.  It was my pleasure to participate in Patricia [name]'s care today. Please feel free to contact me with any questions or concerns.    Sincerely,  Alfonse Spruce, MD

## 2019-09-27 NOTE — Assessment & Plan Note (Addendum)
28 y.o. female who is s/p cholecystectomy in 2018. She had resolution of her RUQ pain after surgery until about 1 week ago. Pain is occurring daily, postprandial, 7/10 in severity, sharp to dull, similar to pain prior to cholecystectomy, and last hours to all day.  Mild nausea, more of a hungry feeling that resolves after eating, but is followed by onset of pain. No vomiting. No GERD symptoms or epigastric or other abdominal pain. No fever, chills, jaundice, or confusion. No NSAIDs. Abdominal exam is benign today. No recent labs or imaging on file.   Unlikely, but possible patient is passing stones through her CBD. Could also have sphincter of oddi dysfunction. At this point, will start with updating labs and obtaining abdominal US to evaluate further. Further recommendations to follow.  Plan to follow-up in 2 months. ED return precautions given.

## 2019-09-28 LAB — CBC WITH DIFFERENTIAL/PLATELET
Absolute Monocytes: 760 cells/uL (ref 200–950)
Basophils Absolute: 19 cells/uL (ref 0–200)
Basophils Relative: 0.2 %
Eosinophils Absolute: 171 cells/uL (ref 15–500)
Eosinophils Relative: 1.8 %
HCT: 38.1 % (ref 35.0–45.0)
Hemoglobin: 12.3 g/dL (ref 11.7–15.5)
Lymphs Abs: 3173 cells/uL (ref 850–3900)
MCH: 25.8 pg — ABNORMAL LOW (ref 27.0–33.0)
MCHC: 32.3 g/dL (ref 32.0–36.0)
MCV: 80 fL (ref 80.0–100.0)
MPV: 10.4 fL (ref 7.5–12.5)
Monocytes Relative: 8 %
Neutro Abs: 5377 cells/uL (ref 1500–7800)
Neutrophils Relative %: 56.6 %
Platelets: 370 10*3/uL (ref 140–400)
RBC: 4.76 10*6/uL (ref 3.80–5.10)
RDW: 12.8 % (ref 11.0–15.0)
Total Lymphocyte: 33.4 %
WBC: 9.5 10*3/uL (ref 3.8–10.8)

## 2019-09-28 LAB — COMPLETE METABOLIC PANEL WITH GFR
AG Ratio: 1.4 (calc) (ref 1.0–2.5)
ALT: 14 U/L (ref 6–29)
AST: 14 U/L (ref 10–30)
Albumin: 4.1 g/dL (ref 3.6–5.1)
Alkaline phosphatase (APISO): 68 U/L (ref 31–125)
BUN: 10 mg/dL (ref 7–25)
CO2: 28 mmol/L (ref 20–32)
Calcium: 9.4 mg/dL (ref 8.6–10.2)
Chloride: 103 mmol/L (ref 98–110)
Creat: 0.66 mg/dL (ref 0.50–1.10)
GFR, Est African American: 138 mL/min/{1.73_m2} (ref >=60)
GFR, Est Non African American: 119 mL/min/{1.73_m2} (ref >=60)
Globulin: 3 g/dL (calc) (ref 1.9–3.7)
Glucose, Bld: 82 mg/dL (ref 65–139)
Potassium: 4.8 mmol/L (ref 3.5–5.3)
Sodium: 138 mmol/L (ref 135–146)
Total Bilirubin: 0.4 mg/dL (ref 0.2–1.2)
Total Protein: 7.1 g/dL (ref 6.1–8.1)

## 2019-09-29 ENCOUNTER — Encounter: Payer: Self-pay | Admitting: Allergy & Immunology

## 2019-09-30 NOTE — Progress Notes (Signed)
CBC and CMP look good. WBC within normal limits. Hemoglobin is normal. Electrolytes, kidney function, and liver function tests are normal.   Waiting on RUQ [place] which is scheduled on 10/04/19.

## 2019-10-04 ENCOUNTER — Ambulatory Visit (HOSPITAL_COMMUNITY)
Admission: RE | Admit: 2019-10-04 | Discharge: 2019-10-04 | Disposition: A | Discharge status: Home / Self Care | Payer: 59 | Source: Ambulatory Visit | Attending: Gastroenterology | Admitting: Gastroenterology

## 2019-10-04 ENCOUNTER — Other Ambulatory Visit: Payer: Self-pay

## 2019-10-04 DIAGNOSIS — R1011 Right upper quadrant pain: Secondary | ICD-10-CM

## 2019-10-04 NOTE — Progress Notes (Signed)
No evidence of bile duct dilation. She does have a fatty liver. Is she continuing to have postprandial RUQ pain? If so, I would like for her to complete H. Pylori breath test. She should not take any antacid medications for 14 days prior to her breath test. She is not on any prescriptive acid reflux medications. IF she is not taking anything over the counter, she can go ahead and have the test completed. If the test is negative, will likely start her on Protonix 40 mg daily to empirically treat for possible inflammation in her stomach.   Instructions for fatty liver: Recommend 1-2# weight loss per week until ideal body weight through exercise & diet. Low fat/cholesterol diet.  Avoid sweets, sodas, fruit juices, sweetened beverages like tea, etc. Gradually increase exercise from 15 min daily up to 1 hr per day 5 days/week. Limit alcohol use.

## 2019-10-08 LAB — H. PYLORI BREATH TEST: H. pylori Breath Test: NOT DETECTED

## 2019-10-08 NOTE — Progress Notes (Signed)
H. Pylor breath test is negative. I would like to start patient on Protonix 40 mg daily to empirically treat for gastritis/duodenitis between now and next visit. We will see how she is doing at her scheduled follow-up in January. If she has worsening of her symptoms between now and then, she should let us know.   Please let me know if patient is agreeable to Protonix and I will send in a prescription.

## 2019-10-09 ENCOUNTER — Telehealth: Payer: Self-pay

## 2019-10-09 ENCOUNTER — Other Ambulatory Visit: Payer: Self-pay | Admitting: Gastroenterology

## 2019-10-09 DIAGNOSIS — R197 Diarrhea, unspecified: Secondary | ICD-10-CM

## 2019-10-09 DIAGNOSIS — R1011 Right upper quadrant pain: Secondary | ICD-10-CM

## 2019-10-09 MED ORDER — PANTOPRAZOLE SODIUM 40 MG PO TBEC: 40.0000 mg | DELAYED_RELEASE_TABLET | Freq: Every day | ORAL | 5 refills | Status: DC

## 2019-10-09 MED ORDER — CHOLESTYRAMINE 4 G PO PACK: 1.0000 | PACK | Freq: Two times a day (BID) | ORAL | 5 refills | Status: DC

## 2019-10-09 NOTE — Telephone Encounter (Signed)
Spoke with patient.  I have called in Questran 4 g twice daily with meals for suspected bile salt diarrhea.  I have also called in Protonix 40 mg daily to empirically treat for gastritis/duodenitis.  Advised patient to administer other oral medications ?1 hour before or 4 to 6 hours after cholestyramine.     Doris [employer]

## 2019-10-09 NOTE — Telephone Encounter (Signed)
Noted  

## 2019-10-09 NOTE — Telephone Encounter (Signed)
Pt called back and asked about the Protonix. She looked up the Protonix and it said one of the side effects could be diarrhea. She was under the impression that Cyril Mourning was going to start her on Questran after the lab results. She said she does not have reflux and I told her the Protonix was to help with gastritis. She said she was a bite confused since the Questran was not called in. Mustang, please advise!

## 2019-10-12 ENCOUNTER — Telehealth: Payer: Self-pay | Admitting: *Deleted

## 2019-10-12 NOTE — Telephone Encounter (Signed)
-----   Message from Valentina Shaggy, MD sent at 10/10/2019  3:48 PM EST ----- Here is the Dupixent start.

## 2019-10-12 NOTE — Telephone Encounter (Signed)
Spoke to patient and explained process submit, copay card and approval will be in touch. Gave instrux upon receipt of Rx

## 2019-10-12 NOTE — Telephone Encounter (Signed)
L/M for patient to contact me to discuss starting [place] for atopic dermatitis

## 2019-10-31 ENCOUNTER — Telehealth: Payer: Self-pay | Admitting: Allergy & Immunology

## 2019-10-31 MED ORDER — DUPIXENT 300 MG/2ML ~~LOC~~ SOAJ: 600.0000 mg | Freq: Once | SUBCUTANEOUS | 11 refills | Status: AC

## 2019-10-31 NOTE — Telephone Encounter (Signed)
New rx sent to [facility], patient advised

## 2019-10-31 NOTE — Telephone Encounter (Signed)
Patient has not been approved for Dupixent and needs a prior authorization. She said the specialty pharmacy told her she needed a script for the pens, not the prefilled ones.

## 2019-11-09 ENCOUNTER — Ambulatory Visit: Payer: 59

## 2019-11-09 ENCOUNTER — Other Ambulatory Visit: Payer: Self-pay

## 2019-11-09 DIAGNOSIS — L2089 Other atopic dermatitis: Secondary | ICD-10-CM

## 2019-11-09 NOTE — Progress Notes (Signed)
Immunotherapy   Patient Details  Name: Patricia [name] MRN: [id number] Date of Birth: 11-25-1990  11/09/2019  [name] here to start [place] AutoInjector. Patient was instructed on how to self-administer injections. She demonstrated proper technique. Self administered loading dose of 600 mg Dupixent (300mg /33mL in each pen) into right and left abdomen. Patient waited in office 30 minutes post injection. She did have a 1 inch diameter induration immediately following injection of the second pen into her right abdomen. I did let Dr. Ernst Bowler look at the area. Patient denied itching or other symptoms. Per Dr. Ernst Bowler patient okay to discharge. Patient advise to contact office with any questions or concerns. Patient verbalized understanding.  [name] 11/09/2019, 2:03 PM

## 2019-11-14 ENCOUNTER — Other Ambulatory Visit: Payer: Self-pay

## 2019-11-14 ENCOUNTER — Encounter: Payer: Self-pay | Admitting: Allergy & Immunology

## 2019-11-14 ENCOUNTER — Ambulatory Visit (INDEPENDENT_AMBULATORY_CARE_PROVIDER_SITE_OTHER): Payer: 59 | Admitting: Allergy & Immunology

## 2019-11-14 VITALS — BP 128/84 | HR 75 | Temp 98.0°F | Resp 18

## 2019-11-14 DIAGNOSIS — J4520 Mild intermittent asthma, uncomplicated: Secondary | ICD-10-CM | POA: Diagnosis not present

## 2019-11-14 NOTE — Progress Notes (Signed)
    [name]  Patricia [name] is a 28 y.o. female presenting to start peanut oral immunotherapy.  She was last seen in November 2020, when we first discussed oral immunotherapy.  She has a history of anaphylaxis to peanut.  Her main goal was to prevent cross-contamination issues.  With her history of atopic dermatitis and asthma, we also made the decision to start [place].  We felt this would be a good adjuvant to help the OIT work more effectively. She received her loading  dose of that on 11/09/2019. She does report some fatigue from that injection.   Consent signed: Yes Patricia [name] completed with the past 3 weeks: Yes  Vitals:   11/14/19 0910  BP: 128/84  Pulse: 75  Resp: 18  Temp: 98 F (36.7 C)  SpO2: 96%    Baseline Assessment:  Complaints of acute illness (including asthma symptoms): None  Skin:within normal limits HEENT: within normal limits Mood/affect: within normal limits  Spirometry: FEV1: 2.98/115%, FVC: 3.35/103%, ratio 88%   Rescue Medications (if needed): Epinephrine dose: 0.3 mg Benadryl dose: 50 mg (20 mL)    Peanut solution is to be given every 20 minutes.   Concentration Dose Patient pre-dose status Time administered Reaction Comments  2.5 g/mL 2 mL Stable 9:44 AM None    4 mL Stable 10:08 AM Diarrhea (presumed reaction of apple juice), otherwise no symptoms   25 g/mL 1 mL Stable 10:35 AM None    2 mL Stable 10:54 PM None    4 mL Stable 11:28 AM Diarrhea (presmed reaction to apple juice), otherwise no symptoms   250 g/mL 1 mL Stable 11:53 AM None    2 mL Stable 12:16 PM None    4 mL Stable 12:40 PM Diarrhea (presumed reaction to apple juice), otherwise no symptoms   2.5 mg/mL 1 mL Stable 1:05 PM None    2 mL Stable 1:28 PM None   One hour post dose --- Stable 2:33 PM None      Given the up dosing today, Patricia [name] will be sent home with the following dose: 2 mL 2.5 mcg/mL peanut suspension  solution.   Assessment/Plan  1.  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  - Continue the following dose until the next visit: 2 mL 2.5 mg/mL peanut suspension daily - Start Zyrtec (cetirizine) 10mg  daily. - Be sure to eat breakfast before you take your dose each morning. - NO exercise for two hours after the dose.  - The following physician is on call for the next week: Dr. Ernst Bowler [phone]). - Feel free to reach out for any questions or concerns.   2.  Return in about 1 week (around 11/21/2019).   [name], MD [facility]

## 2019-11-14 NOTE — Patient Instructions (Addendum)
1. 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  - Continue the following dose until the next visit: 2 mL 2.5 mg/mL peanut suspension daily - Start Zyrtec (cetirizine) 10mg  daily. - Be sure to eat breakfast before you take your dose each morning. - NO exercise for two hours after the dose.  - The following physician is on call for the next week: Dr. Ernst Bowler [phone]). - Feel free to reach out for any questions or concerns.   2.  Return in about 1 week (around 11/21/2019).   It was a pleasure to see you again today!  Websites that have reliable patient information: 1. American Academy of Asthma, Allergy, and Immunology: www.aaaai.org 2. Food Allergy Research and Education (FARE): foodallergy.org 3. Mothers of Asthmatics: http://www.asthmacommunitynetwork.org 4. American College of Allergy, Asthma, and Immunology: www.acaai.org    Food Oral Immunotherapy Do's and Don'ts   DO . Give the dose after having at least a snack.  Marland Kitchen Keep liquids refrigerated.  . Give escalation doses 21-27 hours apart.  . Call the office if a dose is missed. Do not give the next dose before getting instructions from our office.  . Call if there are any signs of reaction.  . Give EpiPen or Auvi-Q right away if there are signs of a severe reaction: sneezing, wheezing, cough, shortness of breath, swelling of the mouth or throat, change in voice quality, vomiting or sudden quietness. If there is a single episode of vomiting while or immediately after taking the dose and there are NO other problems, you may observe without treatment but if any other symptoms develop, administer epinephrine immediately.  . Go to the ER right away if epinephrine is given.  . Call before the next dose if there is a new illness.  . Have epinephrine available at all times!!  . Let us know by phone or email about minor problems that occur more than once.  Marland Kitchen Keep track of your doses remaining  so that you don't run out unexpectedly.  . Be alert to your OIT child at brother's or sister's soccer game or other sporting event; they are likely to run around as much as children on the field.  . Call right away for extra dosing solution if the supply is low or if an appointment must be rescheduled.   DON'T  . Don't give the dose on an empty stomach.  . Don't exercise for at least 2 hours after the OIT dose. No activity that increases the heart rate or increases body temperature.  . Don't give an escalation dose without calling the office first if it has been more than 24 hours since the last dose.  . Don't come for a dose increase if there is an active illness or asthma flare. Call to reschedule after the illness has resolved.  . Don't treat a mild reaction (a few hives, mouth itch, mild abdominal pain) that resolves within 1 hour.

## 2019-11-21 ENCOUNTER — Ambulatory Visit (INDEPENDENT_AMBULATORY_CARE_PROVIDER_SITE_OTHER): Payer: 59 | Admitting: Allergy & Immunology

## 2019-11-21 ENCOUNTER — Encounter: Payer: Self-pay | Admitting: Allergy & Immunology

## 2019-11-21 ENCOUNTER — Other Ambulatory Visit: Payer: Self-pay

## 2019-11-21 VITALS — BP 126/86 | HR 86 | Temp 98.1°F | Resp 18

## 2019-11-21 DIAGNOSIS — T7800XD Anaphylactic reaction due to unspecified food, subsequent encounter: Secondary | ICD-10-CM

## 2019-11-21 NOTE — Patient Instructions (Addendum)
1. Anaphylaxis to peanut - on oral immunotherapy - Andrian tolerated her updosing today.  - Continue the following dose until the next visit: 4 mL 2.5 mg/mL peanut suspension   - The following physician is on call for the next week: Dr. Ernst Bowler [phone]). - Feel free to reach out for any questions or concerns.   2.  Return in about 1 week (around 11/28/2019).    It was a pleasure to see you again today!  Websites that have reliable patient information: 1. American Academy of Asthma, Allergy, and Immunology: www.aaaai.org 2. Food Allergy Research and Education (FARE): foodallergy.org 3. Mothers of Asthmatics: http://www.asthmacommunitynetwork.org 4. American College of Allergy, Asthma, and Immunology: www.acaai.org    Food Oral Immunotherapy Do's and Don'ts   DO . Give the dose after having at least a snack.  Marland Kitchen Keep liquids refrigerated.  . Give escalation doses 21-27 hours apart.  . Call the office if a dose is missed. Do not give the next dose before getting instructions from our office.  . Call if there are any signs of reaction.  . Give EpiPen or Auvi-Q right away if there are signs of a severe reaction: sneezing, wheezing, cough, shortness of breath, swelling of the mouth or throat, change in voice quality, vomiting or sudden quietness. If there is a single episode of vomiting while or immediately after taking the dose and there are NO other problems, you may observe without treatment but if any other symptoms develop, administer epinephrine immediately.  . Go to the ER right away if epinephrine is given.  . Call before the next dose if there is a new illness.  . Have epinephrine available at all times!!  . Let us know by phone or email about minor problems that occur more than once.  Marland Kitchen Keep track of your doses remaining so that you don't run out unexpectedly.  . Be alert to your OIT child at brother's or sister's soccer game or other sporting event; they are likely to run  around as much as children on the field.  . Call right away for extra dosing solution if the supply is low or if an appointment must be rescheduled.   DON'T  . Don't give the dose on an empty stomach.  . Don't exercise for at least 2 hours after the OIT dose. No activity that increases the heart rate or increases body temperature.  . Don't give an escalation dose without calling the office first if it has been more than 24 hours since the last dose.  . Don't come for a dose increase if there is an active illness or asthma flare. Call to reschedule after the illness has resolved.  . Don't treat a mild reaction (a few hives, mouth itch, mild abdominal pain) that resolves within 1 hour.

## 2019-11-21 NOTE — Progress Notes (Signed)
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  - Continue the following dose until the next visit: 4 mL 2.5 mg/mL peanut suspension   - The following physician is on call for the next week: Dr. Ernst Bowler [phone]). - Feel free to reach out for any questions or concerns.   2.  Return in about 1 week (around 11/28/2019).  Subjective:   SHANIEKA BLEA is a 28 y.o. female presenting today for follow up of  Chief Complaint  Patient presents with  . OIT Buildup    TALAYEH BRUINSMA has a history of the following: Patient Active Problem List   Diagnosis Date Noted  . Diarrhea 09/27/2019  . Elevated LFTs 06/17/2017  . Calculus of gallbladder with acute cholecystitis without obstruction   . Nausea vomiting and diarrhea   . RUQ abdominal pain   . Transaminitis   . Biliary colic   . Gall stones, common bile duct   . Abdominal pain 06/04/2017  . 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 Chronic hypertension in pregnancy 04/13/2017  . Chronic hypertension affecting pregnancy 11/08/2016  . 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 Supervision of high risk pregnancy, antepartum 09/27/2016  . History of gestational hypertension 09/27/2016  . History of shoulder dystocia in prior pregnancy, currently pregnant 09/27/2016  . URI (upper respiratory infection) 08/01/2015  . Persistent cough 08/01/2015  . Obesity, Class III, BMI 40-49.9 (morbid obesity) ([place]) 09/23/2014  . Headache 09/23/2014  . PCO (polycystic ovaries) 03/08/2014    History obtained from: chart review and patient.  Ethelene Hal Primary Care Provider is Celene Squibb, MD.     ANAIZ QAZI is a 28 y.o. female presenting to  increase her peanut OIT dose. She completed the peanut rapid escalation in December of 2020. Her current dose is 2 mL 2.5 mg/mL peanut suspension . Naisha tolerated her dose without oral itching, stomach pain, diarrhea, vomiting, itching or hives.  She did have 3 episodes of diarrhea during the rapid escalation last week, but she had no other diarrhea following the doses at home.  We did give her Zyrtec last week and she has taken it every day since.  She denies any symptoms of eosinophilic esophagitis, including reflux, stomach pain, difficulty swallowing, weight loss or chest pain.   Otherwise, there have been no changes to her past medical history, surgical history, family history, or social history.    Review of Systems: a 14-point review of systems is pertinent for what is mentioned in HPI.  Otherwise, all other systems were negative.  Constitutional: negative other than that listed in the HPI Eyes: negative other than that listed in the HPI Ears, nose, mouth, throat, and face: negative other than that listed in the HPI Respiratory: negative other than that listed in the HPI Cardiovascular: negative other than that listed in the HPI Gastrointestinal: negative other than that listed in the HPI Genitourinary: negative other than that listed in the HPI Integument: negative other than that listed in the HPI Hematologic: negative other than that listed in the HPI Musculoskeletal: negative other than that listed in the HPI Neurological: negative other than that listed in the HPI Allergy/Immunologic: negative other than that listed in the HPI    Objective:  Blood pressure 126/86, pulse 86, temperature 98.1 F (36.7 C), temperature source Temporal, resp. rate 18, SpO2 98 %, unknown if currently breastfeeding. There is no height or weight on file to calculate BMI.   Physical Exam:  General: Alert, interactive, in no acute distress. Eyes: No conjunctival injection bilaterally, no  discharge on the right, no discharge on the left and no Horner-Trantas dots present. PERRL bilaterally. EOMI without pain. No photophobia.  Ears: Right TM pearly gray with normal light reflex, Left TM pearly gray with normal light reflex, Right TM intact without perforation and Left TM intact without perforation.  Nose/Throat: External nose within normal limits and septum midline. Turbinates edematous with clear discharge. Posterior oropharynx mildly erythematous without cobblestoning in the posterior oropharynx. Tonsils 2+ without exudates.  Tongue without thrush. Lungs: Clear to auscultation without wheezing, rhonchi or rales. No increased work of breathing. CV: Normal S1/S2. No murmurs. Capillary refill <2 seconds.  Skin: Warm and dry, without lesions or rashes. Neuro:   Grossly intact. No focal deficits appreciated. Responsive to questions.     Rescue Medications (if needed): Epinephrine dose: 0.3 mg Benadryl dose: 50 mg (20 mL)  Cetirizine dose: 10mg  (10mL)  Rian was given 4 mL 2.5 mg/mL peanut suspension   Time Jaydyn was given the dose: 8:50 AM Time Rosemond was discharged: 9:59 AM  Given the up dosing today, Janeann will be sent home with the following dose: 4 mL 2.5 mg/mL peanut suspension.  Darel Hong, MD [facility]

## 2019-11-28 ENCOUNTER — Other Ambulatory Visit: Payer: Self-pay

## 2019-11-28 ENCOUNTER — Ambulatory Visit (INDEPENDENT_AMBULATORY_CARE_PROVIDER_SITE_OTHER): Payer: 59 | Admitting: Allergy & Immunology

## 2019-11-28 ENCOUNTER — Encounter: Payer: Self-pay | Admitting: Allergy & Immunology

## 2019-11-28 VITALS — BP 138/84 | HR 74 | Temp 97.2°F | Resp 20

## 2019-11-28 DIAGNOSIS — T7800XD Anaphylactic reaction due to unspecified food, subsequent encounter: Secondary | ICD-10-CM

## 2019-11-28 NOTE — Progress Notes (Signed)
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  - Continue the following dose until the next visit: 6 mL 2.5 mg/mL peanut suspension   - The following physician is on call for the next week: Dr. Dellis Anes [phone]). - Feel free to reach out for any questions or concerns.   2.  Return in about 1 week (around 11/28/2019).  Subjective:   Patricia [name] is a 29 y.o. female presenting today for follow up of  Chief Complaint  Patient presents with  . OIT Build Up    Peanut    Patricia [name] has a history of the following: Patient Active Problem List   Diagnosis Date Noted  . Diarrhea 09/27/2019  . Elevated LFTs 06/17/2017  . Calculus of gallbladder with acute cholecystitis without obstruction   . Nausea vomiting and diarrhea   . RUQ abdominal pain   . Transaminitis   . Biliary colic   . Gall stones, common bile duct   . Abdominal pain 06/04/2017  . 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 Chronic hypertension in pregnancy 04/13/2017  . Chronic hypertension affecting pregnancy 11/08/2016  . 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 Supervision of high risk pregnancy, antepartum 09/27/2016  . History of gestational hypertension 09/27/2016  . History of shoulder dystocia in prior pregnancy, currently pregnant 09/27/2016  . URI (upper respiratory infection) 08/01/2015  . Persistent cough 08/01/2015  . Obesity, Class III, BMI 40-49.9 (morbid obesity) ([facility]) 09/23/2014  . Headache 09/23/2014  . PCO (polycystic ovaries) 03/08/2014    History obtained from: chart review and patient.  Patricia [name] Primary Care Provider is Benita Stabile, MD.     Patricia RODRIGES is a 29 y.o. female  presenting to increase her peanut OIT dose. She completed the peanut rapid escalation in December of 2020. Her current dose is 4 mL 2.5 mg/mL peanut suspension . Jem tolerated her dose without oral itching, stomach pain, diarrhea, vomiting, itching or hives.  She did have 3 episodes of diarrhea during the rapid escalation, but she had no other diarrhea following the doses at home.  We did give her Zyrtec last week and she has taken it every day since.  She did have some transient throat irritation during up dosing visit last week, but this was not initiated for the remaining days of the week of dosing.  She denies any symptoms of eosinophilic esophagitis, including reflux, stomach pain, difficulty swallowing, weight loss or chest pain.   Otherwise, there have been no changes to her past medical history, surgical history, family history, or social history.    Review of Systems: a 14-point review of systems is pertinent for what is mentioned in HPI.  Otherwise, all other systems were negative.  Constitutional: negative other than that listed in the HPI Eyes: negative other than that listed in the HPI Ears, nose, mouth, throat, and face: negative other than that listed in the HPI Respiratory: negative other than that listed in the HPI Cardiovascular: negative other than that listed in the HPI Gastrointestinal: negative other than that listed in the HPI Genitourinary: negative other than that listed in the HPI Integument: negative other than that listed in the HPI Hematologic: negative other than that listed in the HPI  Musculoskeletal: negative other than that listed in the HPI Neurological: negative other than that listed in the HPI Allergy/Immunologic: negative other than that listed in the HPI    Objective:   Blood pressure (!) 164/90, pulse 74, temperature (!) 97.2 F (36.2 C), temperature source Temporal, resp. rate 20, SpO2 98 %, unknown if currently breastfeeding. There is no height  or weight on file to calculate BMI.   Physical Exam:  General: Alert, interactive, in no acute distress. Eyes: No conjunctival injection bilaterally, no discharge on the right, no discharge on the left and no Horner-Trantas dots present. PERRL bilaterally. EOMI without pain. No photophobia.  Ears: Right TM pearly gray with normal light reflex, Left TM pearly gray with normal light reflex, Right TM intact without perforation and Left TM intact without perforation.  Nose/Throat: External nose within normal limits and septum midline. Turbinates edematous with clear discharge. Posterior oropharynx mildly erythematous without cobblestoning in the posterior oropharynx. Tonsils 2+ without exudates.  Tongue without thrush. Lungs: Clear to auscultation without wheezing, rhonchi or rales. No increased work of breathing. CV: Normal S1/S2. No murmurs. Capillary refill <2 seconds.  Skin: Warm and dry, without lesions or rashes. Neuro:   Grossly intact. No focal deficits appreciated. Responsive to questions.     Rescue Medications (if needed): Epinephrine dose: 0.3 mg Benadryl dose: 50 mg (20 mL)  Cetirizine dose: 10mg  (15mL)  Patricia [name] was given 6 mL 2.5 mg/mL peanut suspension   Time Patricia [name] was given the dose: 8:50 AM Time Patricia [name] was discharged: 9:59 AM  Given the up dosing today, Patricia [name] will be sent home with the following dose: 6 mL 2.5 mg/mL peanut suspension.  [name], MD [facility]

## 2019-11-28 NOTE — Progress Notes (Signed)
Referring Provider: Celene Squibb, MD Primary Care Physician:  Celene Squibb, MD Primary GI Physician: Dr. Oneida Alar  Chief Complaint  Patient presents with  . Abdominal Pain    doing better. last episode december  . Diarrhea    last episode November    HPI:   Patricia [name] is a 29 y.o. female presenting today for follow-up of a right upper quadrant pain and diarrhea s/p cholecystectomy in 2018.  Last seen on 09/27/2019 for the same.  She reported return of daily right upper quadrant pain that felt similar to pain prior to cholecystectomy after eating x1 week.  Reported nausea that was more a feeling like she needed to eat something without vomiting.  Denied GERD symptoms or other abdominal pain.  No NSAIDs or unintentional weight loss.  Postprandial watery/greasy diarrhea with urgency had been present since cholecystectomy.  Prior to cholecystectomy, stools soft and formed with 2-3 a day.  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  Reuarding right upper quadrant pain, query whether patient was passing stones for her CBD versus possible sphincter of Oddi dysfunction.  Plans were to update labs and obtain abdominal ultrasound.  Labs on 09/27/2019: CBC essentially normal, CMP normal. Ultrasound completed on 10/04/2019 with common bile duct normal, mild increased hepatic echogenicity suggesting fatty infiltration.  She was given information on fatty liver.  H. pylori breath test negative on 10/05/2019. Patient was started on Protonix 40 mg daily empirically for possible gastritis/duodenitis. 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  Today: She is doing well. No abdominal pain since December, around Christmas. Prior to this, it had been several weeks. States it wasn't long after starting Questran and changing her diet, her abdominal pain went away. Think she had pain on Christmas due to "loading up on all the bad stuff." Has limited sugar, bread, pasta, and soda. No fried foods. Also taking  Protonix daily. Taking Questran with breakfast and with dinner.   Diarrhea has improved greatly. Having a 3 BMs a day after meals which was her norm. No longer runny. Urgency has resolved. No blood in the stool. No black stools.   Denies nausea or vomiting. No GERD symptoms. Didn't have GERD symptoms prior to Protonix. Rarely takes naproxen. Never used NSAIDs regularly. No dysphagia.   Fatty Liver: No jaundice, confusion, no swelling in abdomen or legs. No bruising. Urine is pale yellow to clear. No family history of liver disease.      Past Medical History:  Diagnosis Date  . Asthma   . Cough 08/01/2015  . Migraine   . Migraines   . Nausea and vomiting during pregnancy prior to [redacted] weeks gestation 08/01/2015  . PCO (polycystic ovaries) 03/08/2014  . Polycystic disease, ovaries   . Pregnancy induced hypertension   . Pregnant 05/27/2015  . URI (upper respiratory infection) 08/01/2015    Past Surgical History:  Procedure Laterality Date  . CHOLECYSTECTOMY N/A 06/08/2017   Procedure: LAPAROSCOPIC CHOLECYSTECTOMY;  Surgeon: Aviva Signs, MD;  Location: [facility];  Service: General;  Laterality: N/A;  . TONSILLECTOMY    . WISDOM TOOTH EXTRACTION      Current Outpatient Medications  Medication Sig Dispense Refill  . albuterol (PROVENTIL HFA;VENTOLIN HFA) 108 (90 Base) MCG/ACT inhaler Inhale 2 puffs into the lungs every 4 (four) hours as needed for wheezing or shortness of breath. 1 Inhaler 3  . Biotin 5000 MCG CAPS Take 1 capsule by mouth daily.    . cholestyramine (QUESTRAN) 4 g packet Take  1 packet by mouth 2 (two) times daily with a meal. 60 each 5  . CONCERTA 36 MG CR tablet Take 36 mg by mouth daily.    Marland Kitchen etonogestrel (NEXPLANON) 68 MG IMPL implant 1 each by Subdermal route once.    Marland Kitchen levocetirizine (XYZAL) 5 MG tablet Take 1 tablet (5 mg total) by mouth every evening. 30 tablet 5  . pantoprazole (PROTONIX) 40 MG tablet Take 1 tablet (40 mg total) by mouth daily. 30 tablet 5  . PREVIDENT  5000 BOOSTER PLUS 1.1 % PSTE Apply topically daily.   1  . triamcinolone ointment (KENALOG) 0.1 % Apply 1 application topically 2 (two) times daily as needed. 80 g 3   No current facility-administered medications for this visit.    Allergies as of 11/29/2019 - Review Complete 11/29/2019  Allergen Reaction Noted  . Peanut-containing drug products Anaphylaxis and Hives 04/27/2012  . Propranolol Shortness Of Breath 09/23/2014  . Cephalexin Hives 12/22/2011  . Imitrex [sumatriptan] Hives 10/12/2013    Family History  Problem Relation Age of Onset  . Migraines Sister   . Hypertension Brother   . Heart disease Maternal Grandmother   . Cancer Maternal Grandfather        prostate  . Stroke Paternal Grandmother   . Cancer Paternal Grandfather        prostate  . Stroke Paternal Grandfather   . Atrial fibrillation Father   . Colon cancer Neg Hx   . Allergic rhinitis Neg Hx   . Angioedema Neg Hx   . Asthma Neg Hx   . Atopy Neg Hx   . Eczema Neg Hx   . Urticaria Neg Hx   . Immunodeficiency Neg Hx     Social History   Socioeconomic History  . Marital status: Single    Spouse name: Not on file  . Number of children: Not on file  . Years of education: Not on file  . Highest education level: Not on file  Occupational History  . Not on file  Tobacco Use  . Smoking status: Never Smoker  . Smokeless tobacco: Never Used  Substance and Sexual Activity  . Alcohol use: No    Comment: occassional  . Drug use: No  . Sexual activity: Not Currently    Birth control/protection: None  Other Topics Concern  . Not on file  Social History Narrative  . Not on file   Social Determinants of Health   Financial Resource Strain:   . Difficulty of Paying Living Expenses: Not on file  Food Insecurity:   . Worried About Programme researcher, broadcasting/film/video in the Last Year: Not on file  . Ran Out of Food in the Last Year: Not on file  Transportation Needs:   . Lack of Transportation (Medical): Not on file    . Lack of Transportation (Non-Medical): Not on file  Physical Activity:   . Days of Exercise per Week: Not on file  . Minutes of Exercise per Session: Not on file  Stress:   . Feeling of Stress : Not on file  Social Connections:   . Frequency of Communication with Friends and Family: Not on file  . Frequency of Social Gatherings with Friends and Family: Not on file  . Attends Religious Services: Not on file  . Active Member of Clubs or Organizations: Not on file  . Attends Banker Meetings: Not on file  . Marital Status: Not on file    Review of Systems: Gen: Denies fever,  chills, lightheadedness, dizziness, presyncope, or syncope. CV: Denies chest pain, palpitations  Resp: Denies dyspnea at rest, cough GI: See HPI Derm: Denies rash Psych: Denies depression, anxiety  Heme: Denies bruising, bleeding  Physical Exam: BP (!) 143/95   Pulse 71   Temp 98.2 F (36.8 C) (Oral)   Ht 5\' 4"  (1.626 m)   Wt 292 lb (132.5 kg)   LMP 10/17/2019   BMI 50.12 kg/m  General:   Alert and oriented. No distress noted. Pleasant and cooperative.  Head:  Normocephalic and atraumatic. Eyes:  Conjuctiva clear without scleral icterus. Heart:  S1, S2 present without murmurs appreciated. Lungs:  Clear to auscultation bilaterally. No wheezes, rales, or rhonchi. No distress.  Abdomen:  +BS, soft, non-tender and non-distended. No rebound or guarding. No HSM or masses noted. Msk:  Symmetrical without gross deformities. Normal posture. Extremities:  Without edema. Neurologic:  Alert and  oriented x4 Psych: Normal mood and affect.

## 2019-11-28 NOTE — Patient Instructions (Addendum)
1. 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  - Continue the following dose until the next visit: 6 mL 2.5 mg/mL peanut suspension   - The following physician is on call for the next week: Dr. Delorse Lek [phone]). - Feel free to reach out for any questions or concerns.   2.  Return in about 1 week (around 12/05/2019).    It was a pleasure to see you again today!  Websites that have reliable patient information: 1. American Academy of Asthma, Allergy, and Immunology: www.aaaai.org 2. Food Allergy Research and Education (FARE): foodallergy.org 3. Mothers of Asthmatics: http://www.asthmacommunitynetwork.org 4. American College of Allergy, Asthma, and Immunology: www.acaai.org    Food Oral Immunotherapy Do's and Don'ts   DO . Give the dose after having at least a snack.  Marland Kitchen Keep liquids refrigerated.  . Give escalation doses 21-27 hours apart.  . Call the office if a dose is missed. Do not give the next dose before getting instructions from our office.  . Call if there are any signs of reaction.  . Give EpiPen or Auvi-Q right away if there are signs of a severe reaction: sneezing, wheezing, cough, shortness of breath, swelling of the mouth or throat, change in voice quality, vomiting or sudden quietness. If there is a single episode of vomiting while or immediately after taking the dose and there are NO other problems, you may observe without treatment but if any other symptoms develop, administer epinephrine immediately.  . Go to the ER right away if epinephrine is given.  . Call before the next dose if there is a new illness.  . Have epinephrine available at all times!!  . Let us know by phone or email about minor problems that occur more than once.  Marland Kitchen Keep track of your doses remaining so that you don't run out unexpectedly.  . Be alert to your OIT child at brother's or sister's soccer game or other sporting event; they are likely to run  around as much as children on the field.  . Call right away for extra dosing solution if the supply is low or if an appointment must be rescheduled.   DON'T  . Don't give the dose on an empty stomach.  . Don't exercise for at least 2 hours after the OIT dose. No activity that increases the heart rate or increases body temperature.  . Don't give an escalation dose without calling the office first if it has been more than 24 hours since the last dose.  . Don't come for a dose increase if there is an active illness or asthma flare. Call to reschedule after the illness has resolved.  . Don't treat a mild reaction (a few hives, mouth itch, mild abdominal pain) that resolves within 1 hour.

## 2019-11-29 ENCOUNTER — Encounter: Payer: Self-pay | Admitting: Gastroenterology

## 2019-11-29 ENCOUNTER — Ambulatory Visit (INDEPENDENT_AMBULATORY_CARE_PROVIDER_SITE_OTHER): Payer: 59 | Admitting: Gastroenterology

## 2019-11-29 VITALS — BP 143/95 | HR 71 | Temp 98.2°F | Ht 64.0 in | Wt 292.0 lb

## 2019-11-29 DIAGNOSIS — K760 Fatty (change of) liver, not elsewhere classified: Secondary | ICD-10-CM | POA: Insufficient documentation

## 2019-11-29 DIAGNOSIS — R197 Diarrhea, unspecified: Secondary | ICD-10-CM

## 2019-11-29 DIAGNOSIS — R1011 Right upper quadrant pain: Secondary | ICD-10-CM

## 2019-11-29 NOTE — Assessment & Plan Note (Signed)
Fatty liver noted on ultrasound on 10/04/2019.  Labs at that time with normal LFTs.  Patient is without any signs or symptoms of advanced liver disease.  No family history of liver disease.  She has been working on weight loss through dietary changes and has lost 6 pounds since November 2020.  She is advised to continue working on weight loss through diet and exercise.  Instructions for fatty liver: Recommend 1-2# weight loss per week until ideal body weight through exercise & diet. Low fat/cholesterol diet.   Avoid sweets, sodas, fruit juices, sweetened beverages like tea, etc. Gradually increase exercise from 15 min daily up to 1 hr per day 5 days/week. Limit alcohol use.  Follow-up in 6 months.

## 2019-11-29 NOTE — Patient Instructions (Signed)
You may try to taper off Protonix.  Take 1 tablet daily every other day for 1 week. Then 1 tablet every 3 days for 1 week. Then stop Protonix.  If you have recurrence of your abdominal pain after stopping Protonix, please let us know.  Continue Questran.  For fatty liver: 1-2# weight loss per week until ideal body weight through exercise & diet. Low fat/cholesterol diet.   Avoid sweets, sodas, fruit juices, sweetened beverages like tea, etc. Gradually increase exercise from 15 min daily up to 1 hr per day 5 days/week. Limit alcohol use.  We will plan to see back in 6 months.  Call if you have questions or concerns prior.  Ermalinda Memos, PA-C [facility]

## 2019-11-29 NOTE — Assessment & Plan Note (Signed)
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  Symptoms are much improved with Questran twice daily.  BMs are back to baseline with soft, formed bowel movement after breakfast lunch and dinner.  No urgency or watery stools.  No alarm symptoms.  Patient is pleased with results.   Continue Questran 4 g twice daily with a meal. Follow-up in 6 months.

## 2019-11-29 NOTE — Assessment & Plan Note (Addendum)
29 year old female who is status post cholecystectomy in 2018 reported return of postprandial RUQ abdominal pain at her last visit in November.  Labs completed with CBC essentially normal and CMP normal.  Ultrasound with CBD normal and fatty liver.  H. pylori breath test negative.  She was started on Questran for suspected bile salt diarrhea and Protonix 40 mg empirically for possible gastritis or duodenitis.  Patient reports resolution of her abdominal pain when she changed her diet (limiting sugar, bread, pasta, fatty foods, and soda) and started Questran.  Not sure if Protonix influenced improvement or not.  She did have one episode of abdominal pain at Christmas after "loading up on all the bad stuff." Denies GERD symptoms, nausea, vomiting, dysphagia, or regular NSAID use.  She is advised to continue with dietary changes and Questran.  As she was without GERD symptoms or regular NSAID use prior to onset of abdominal pain, we will try to taper her off Protonix.  She was advised to call if she has return of abdominal pain once she discontinues Protonix.  Otherwise, will follow up in 6 months.

## 2019-12-05 ENCOUNTER — Other Ambulatory Visit: Payer: Self-pay

## 2019-12-05 ENCOUNTER — Encounter: Payer: Self-pay | Admitting: Allergy & Immunology

## 2019-12-05 ENCOUNTER — Ambulatory Visit (INDEPENDENT_AMBULATORY_CARE_PROVIDER_SITE_OTHER): Payer: 59 | Admitting: Allergy & Immunology

## 2019-12-05 VITALS — BP 130/82 | HR 72 | Temp 97.3°F | Resp 18

## 2019-12-05 DIAGNOSIS — T7800XD Anaphylactic reaction due to unspecified food, subsequent encounter: Secondary | ICD-10-CM

## 2019-12-05 NOTE — Patient Instructions (Addendum)
1. 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  - Continue the following dose until the next visit: 8 mL 2.5 mg/mL peanut suspension   - The following physician is on call for the next week: Dr. Dellis Anes [phone]). - Feel free to reach out for any questions or concerns.   2.  Return in about 1 week (around 12/12/2019).    It was a pleasure to see you again today!  Websites that have reliable patient information: 1. American Academy of Asthma, Allergy, and Immunology: www.aaaai.org 2. Food Allergy Research and Education (FARE): foodallergy.org 3. Mothers of Asthmatics: http://www.asthmacommunitynetwork.org 4. American College of Allergy, Asthma, and Immunology: www.acaai.org    Food Oral Immunotherapy Do's and Don'ts   DO . Give the dose after having at least a snack.  Marland Kitchen Keep liquids refrigerated.  . Give escalation doses 21-27 hours apart.  . Call the office if a dose is missed. Do not give the next dose before getting instructions from our office.  . Call if there are any signs of reaction.  . Give EpiPen or Auvi-Q right away if there are signs of a severe reaction: sneezing, wheezing, cough, shortness of breath, swelling of the mouth or throat, change in voice quality, vomiting or sudden quietness. If there is a single episode of vomiting while or immediately after taking the dose and there are NO other problems, you may observe without treatment but if any other symptoms develop, administer epinephrine immediately.  . Go to the ER right away if epinephrine is given.  . Call before the next dose if there is a new illness.  . Have epinephrine available at all times!!  . Let us know by phone or email about minor problems that occur more than once.  Marland Kitchen Keep track of your doses remaining so that you don't run out unexpectedly.  . Be alert to your OIT child at brother's or sister's soccer game or other sporting event; they are likely to run  around as much as children on the field.  . Call right away for extra dosing solution if the supply is low or if an appointment must be rescheduled.   DON'T  . Don't give the dose on an empty stomach.  . Don't exercise for at least 2 hours after the OIT dose. No activity that increases the heart rate or increases body temperature.  . Don't give an escalation dose without calling the office first if it has been more than 24 hours since the last dose.  . Don't come for a dose increase if there is an active illness or asthma flare. Call to reschedule after the illness has resolved.  . Don't treat a mild reaction (a few hives, mouth itch, mild abdominal pain) that resolves within 1 hour.

## 2019-12-05 NOTE — Progress Notes (Signed)
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  - Continue the following dose until the next visit: 8 mL 2.5 mg/mL peanut suspension   - The following physician is on call for the next week: Dr. Dellis Anes [phone]). - Feel free to reach out for any questions or concerns.   2.  Return in about 1 week (around 11/28/2019).  Subjective:   Patricia [name] is a 29 y.o. female presenting today for follow up of  Chief Complaint  Patient presents with  . OIT Updosing    8 mL peanut mix    Patricia [name] has a history of the following: Patient Active Problem List   Diagnosis Date Noted  . Fatty liver 11/29/2019  . Diarrhea 09/27/2019  . Elevated LFTs 06/17/2017  . Calculus of gallbladder with acute cholecystitis without obstruction   . Nausea vomiting and diarrhea   . RUQ abdominal pain   . Transaminitis   . Biliary colic   . Gall stones, common bile duct   . Abdominal pain 06/04/2017  . 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 Chronic hypertension in pregnancy 04/13/2017  . Chronic hypertension affecting pregnancy 11/08/2016  . 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 Supervision of high risk pregnancy, antepartum 09/27/2016  . History of gestational hypertension 09/27/2016  . History of shoulder dystocia in prior pregnancy, currently pregnant 09/27/2016  . URI (upper respiratory infection) 08/01/2015  . Persistent cough 08/01/2015  . Obesity, Class III, BMI 40-49.9 (morbid obesity) (HCC) 09/23/2014  . Headache 09/23/2014  . PCO (polycystic ovaries) 03/08/2014    History obtained from: chart review and patient.  Patricia [name] Primary Care Provider is Patricia Stabile, MD.     Patricia [name] is a 29 y.o. female presenting to increase her peanut OIT dose. She completed the peanut rapid escalation in December of 2020. Her current dose is 6 mL 2.5 mg/mL peanut suspension . Patricia [name] tolerated her dose without oral itching, stomach pain, diarrhea, vomiting, itching or hives.  She did have 3 episodes of diarrhea during the rapid escalation, but she had no other diarrhea following the doses at home.  She has reported some throat irritation from previous doses.  However, over the last week, she has had no problems at all.  She did follow-up with gastroenterology last week after her up dose visit.  However, this was a routine follow-up due to her history of a cholecystectomy.  She denies any symptoms of eosinophilic esophagitis, including reflux, stomach pain, difficulty swallowing, weight loss or chest pain.   Otherwise, there have been no changes to her past medical history, surgical history, family history, or social history.    Review of Systems: a 14-point review of systems is pertinent for what is mentioned in HPI.  Otherwise, all other systems were negative.  Constitutional: negative other than that listed in the HPI Eyes: negative other than that listed in the HPI Ears, nose, mouth, throat, and face: negative other than that listed in the HPI Respiratory: negative other than that listed in the HPI Cardiovascular: negative other than that listed in the HPI Gastrointestinal: negative other than that listed in the HPI Genitourinary: negative other than that listed in the HPI Integument: negative other than that  listed in the HPI Hematologic: negative other than that listed in the HPI Musculoskeletal: negative other than that listed in the HPI Neurological: negative other than that listed in the HPI Allergy/Immunologic: negative other than that listed in the HPI    Objective:   Blood pressure 130/82, pulse 72, temperature (!) 97.3 F (36.3 C), temperature source Temporal,  resp. rate 18, unknown if currently breastfeeding. There is no height or weight on file to calculate BMI.   Physical Exam:  General: Alert, interactive, in no acute distress.  Talkative. Eyes: No conjunctival injection bilaterally, no discharge on the right, no discharge on the left and no Horner-Trantas dots present. PERRL bilaterally. EOMI without pain. No photophobia.  Ears: Right TM pearly gray with normal light reflex, Left TM pearly gray with normal light reflex, Right TM intact without perforation and Left TM intact without perforation.  Nose/Throat: External nose within normal limits and septum midline. Turbinates edematous with clear discharge. Posterior oropharynx mildly erythematous without cobblestoning in the posterior oropharynx. Tonsils 2+ without exudates.  Tongue without thrush. Lungs: Clear to auscultation without wheezing, rhonchi or rales. No increased work of breathing.  Moving air well in all lung fields. CV: Normal S1/S2. No murmurs. Capillary refill <2 seconds.  Skin: Warm and dry, without lesions or rashes. Neuro:   Grossly intact. No focal deficits appreciated. Responsive to questions.     Rescue Medications (if needed): Epinephrine dose: 0.3 mg Benadryl dose: 50 mg (20 mL)  Cetirizine dose: 10mg  (8mL)  Patricia [name] was given 8 mL 2.5 mg/mL peanut suspension   Time Patricia [name] was given the dose: 8:30 AM Time Patricia [name] was discharged: 9:55 AM  Given the up dosing today, Patricia [name] will be sent home with the following dose: 8 mL 2.5 mg/mL peanut suspension.  Patricia Marvel, MD [facility] and [name] [place]

## 2019-12-12 ENCOUNTER — Encounter: Payer: Self-pay | Admitting: Allergy & Immunology

## 2019-12-12 ENCOUNTER — Other Ambulatory Visit: Payer: Self-pay

## 2019-12-12 ENCOUNTER — Ambulatory Visit (INDEPENDENT_AMBULATORY_CARE_PROVIDER_SITE_OTHER): Payer: 59 | Admitting: Allergy & Immunology

## 2019-12-12 VITALS — BP 122/94 | HR 92 | Resp 18

## 2019-12-12 DIAGNOSIS — T7800XD Anaphylactic reaction due to unspecified food, subsequent encounter: Secondary | ICD-10-CM

## 2019-12-12 NOTE — Patient Instructions (Addendum)
1. 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  - Continue the following dose until the next visit: 1 mL 25 mg/mL peanut suspension   - The following physician is on call for the next week: Dr. Dellis Anes [phone]). - Feel free to reach out for any questions or concerns.   2.  Return in about 1 week (around 12/19/2019).    It was a pleasure to see you again today!  Websites that have reliable patient information: 1. American Academy of Asthma, Allergy, and Immunology: www.aaaai.org 2. Food Allergy Research and Education (FARE): foodallergy.org 3. Mothers of Asthmatics: http://www.asthmacommunitynetwork.org 4. American College of Allergy, Asthma, and Immunology: www.acaai.org    Food Oral Immunotherapy Do's and Don'ts   DO . Give the dose after having at least a snack.  Marland Kitchen Keep liquids refrigerated.  . Give escalation doses 21-27 hours apart.  . Call the office if a dose is missed. Do not give the next dose before getting instructions from our office.  . Call if there are any signs of reaction.  . Give EpiPen or Auvi-Q right away if there are signs of a severe reaction: sneezing, wheezing, cough, shortness of breath, swelling of the mouth or throat, change in voice quality, vomiting or sudden quietness. If there is a single episode of vomiting while or immediately after taking the dose and there are NO other problems, you may observe without treatment but if any other symptoms develop, administer epinephrine immediately.  . Go to the ER right away if epinephrine is given.  . Call before the next dose if there is a new illness.  . Have epinephrine available at all times!!  . Let us know by phone or email about minor problems that occur more than once.  Marland Kitchen Keep track of your doses remaining so that you don't run out unexpectedly.  . Be alert to your OIT child at brother's or sister's soccer game or other sporting event; they are likely to run  around as much as children on the field.  . Call right away for extra dosing solution if the supply is low or if an appointment must be rescheduled.   DON'T  . Don't give the dose on an empty stomach.  . Don't exercise for at least 2 hours after the OIT dose. No activity that increases the heart rate or increases body temperature.  . Don't give an escalation dose without calling the office first if it has been more than 24 hours since the last dose.  . Don't come for a dose increase if there is an active illness or asthma flare. Call to reschedule after the illness has resolved.  . Don't treat a mild reaction (a few hives, mouth itch, mild abdominal pain) that resolves within 1 hour.

## 2019-12-12 NOTE — Progress Notes (Signed)
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  - Continue the following dose until the next visit: 1 mL 25 mg/mL peanut suspension   - The following physician is on call for the next week: Dr. Dellis Anes [phone]). - Feel free to reach out for any questions or concerns.   2.  Return in about 1 week (around 12/19/2019).   Subjective:   Patricia [name] is a 29 y.o. female presenting today for follow up of  Chief Complaint  Patient presents with  . Food Intolerance    Patricia [name] has a history of the following: Patient Active Problem List   Diagnosis Date Noted  . Fatty liver 11/29/2019  . Diarrhea 09/27/2019  . Elevated LFTs 06/17/2017  . Calculus of gallbladder with acute cholecystitis without obstruction   . Nausea vomiting and diarrhea   . RUQ abdominal pain   . Transaminitis   . Biliary colic   . Gall stones, common bile duct   . Abdominal pain 06/04/2017  . 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 Chronic hypertension in pregnancy 04/13/2017  . Chronic hypertension affecting pregnancy 11/08/2016  . 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 Supervision of high risk pregnancy, antepartum 09/27/2016  . History of gestational hypertension 09/27/2016  . History of shoulder dystocia in prior pregnancy, currently pregnant 09/27/2016  . URI (upper respiratory infection) 08/01/2015  . Persistent cough 08/01/2015  . Obesity, Class III, BMI 40-49.9 (morbid obesity) (HCC) 09/23/2014  . Headache 09/23/2014  . PCO (polycystic ovaries) 03/08/2014    History obtained from: chart review and patient.  Patricia [name] Primary Care Provider is Benita Stabile, MD.     Patricia [name] is a  29 y.o. female presenting to increase her peanut OIT dose. She completed the peanut rapid escalation in December of 2020. Her current dose is 8 mL 2.5 mg/mL peanut suspension . Patricia [name] tolerated her dose without oral itching, stomach pain, diarrhea, vomiting, itching or hives.  She had no problems with the dosing over the last week, including throat irritation or stomach pain or any other symptoms.   She did have 3 episodes of diarrhea during the rapid escalation, but she had no other diarrhea following the doses at home.  She has reported some throat irritation from previous doses.   However, over the last week, she has had no problems at all.  She did follow-up with gastroenterology last week after her up dose visit.  However, this was a routine follow-up due to her history of a cholecystectomy.  She denies any symptoms of eosinophilic esophagitis, including reflux, stomach pain, difficulty swallowing, weight loss or chest pain.   Otherwise, there have been no changes to her past medical history, surgical history, family history, or social history.    Review of Systems: a 14-point review of systems is pertinent for what is mentioned in HPI.  Otherwise, all other systems were negative.  Constitutional: negative other than that listed in the HPI Eyes: negative other than that listed in the HPI Ears, nose, mouth, throat, and face: negative other than that listed in the HPI Respiratory: negative other than that listed in the HPI Cardiovascular: negative other than that listed in the HPI Gastrointestinal: negative other than that  listed in the HPI Genitourinary: negative other than that listed in the HPI Integument: negative other than that listed in the HPI Hematologic: negative other than that listed in the HPI Musculoskeletal: negative other than that listed in the HPI Neurological: negative other than that listed in the HPI Allergy/Immunologic: negative other than that listed in the  HPI    Objective:   Blood pressure (!) 122/94, pulse 92, resp. rate 18, SpO2 100 %, unknown if currently breastfeeding. There is no height or weight on file to calculate BMI.   Physical Exam:  General: Alert, interactive, in no acute distress.  Talkative. Very pleasant female. Smiling as always.  Eyes: No conjunctival injection bilaterally, no discharge on the right, no discharge on the left and no Horner-Trantas dots present. PERRL bilaterally. EOMI without pain. No photophobia.  Ears: Right TM pearly gray with normal light reflex, Left TM pearly gray with normal light reflex, Right TM intact without perforation and Left TM intact without perforation.  Nose/Throat: External nose within normal limits and septum midline. Turbinates edematous with clear discharge. Posterior oropharynx mildly erythematous without cobblestoning in the posterior oropharynx. Tonsils 2+ without exudates.  Tongue without thrush. Lungs: Clear to auscultation without wheezing, rhonchi or rales. No increased work of breathing.  Moving air well in all lung fields. CV: Normal S1/S2. No murmurs. Capillary refill <2 seconds.  Skin: Warm and dry, without lesions or rashes. Neuro:   Grossly intact. No focal deficits appreciated. Responsive to questions.     Rescue Medications (if needed): Epinephrine dose: 0.3 mg Benadryl dose: 50 mg (20 mL)  Cetirizine dose: 10mg  (68mL)  Patricia [name] was given 1 mL 25 mg/mL peanut suspension   Time Patricia [name] was given the dose: 9:10 AM Time Patricia [name] was discharged: 10:35 AM  Given the up dosing today, Patricia [name] will be sent home with the following dose: 1 mL 25 mg/mL peanut suspension.  [name], MD Allergy and [place] [facility] [place]

## 2019-12-19 ENCOUNTER — Other Ambulatory Visit: Payer: Self-pay

## 2019-12-19 ENCOUNTER — Ambulatory Visit (INDEPENDENT_AMBULATORY_CARE_PROVIDER_SITE_OTHER): Payer: 59 | Admitting: Allergy & Immunology

## 2019-12-19 ENCOUNTER — Encounter: Payer: Self-pay | Admitting: Allergy & Immunology

## 2019-12-19 VITALS — BP 116/72 | HR 65 | Temp 97.7°F | Resp 18

## 2019-12-19 DIAGNOSIS — T7800XD Anaphylactic reaction due to unspecified food, subsequent encounter: Secondary | ICD-10-CM

## 2019-12-19 NOTE — Patient Instructions (Addendum)
1. 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  - Continue the following dose until the next visit: 1.68mL 25 mg/mL peanut suspension   - The following physician is on call for the next week: Dr. Delorse Lek [phone]). - Feel free to reach out for any questions or concerns.   2.  Return in about 1 week (around 12/26/2019).    It was a pleasure to see you again today!  Websites that have reliable patient information: 1. American Academy of Asthma, Allergy, and Immunology: www.aaaai.org 2. Food Allergy Research and Education (FARE): foodallergy.org 3. Mothers of Asthmatics: http://www.asthmacommunitynetwork.org 4. American College of Allergy, Asthma, and Immunology: www.acaai.org    Food Oral Immunotherapy Do's and Don'ts   DO . Give the dose after having at least a snack.  Marland Kitchen Keep liquids refrigerated.  . Give escalation doses 21-27 hours apart.  . Call the office if a dose is missed. Do not give the next dose before getting instructions from our office.  . Call if there are any signs of reaction.  . Give EpiPen or Auvi-Q right away if there are signs of a severe reaction: sneezing, wheezing, cough, shortness of breath, swelling of the mouth or throat, change in voice quality, vomiting or sudden quietness. If there is a single episode of vomiting while or immediately after taking the dose and there are NO other problems, you may observe without treatment but if any other symptoms develop, administer epinephrine immediately.  . Go to the ER right away if epinephrine is given.  . Call before the next dose if there is a new illness.  . Have epinephrine available at all times!!  . Let us know by phone or email about minor problems that occur more than once.  Marland Kitchen Keep track of your doses remaining so that you don't run out unexpectedly.  . Be alert to your OIT child at brother's or sister's soccer game or other sporting event; they are likely to run around  as much as children on the field.  . Call right away for extra dosing solution if the supply is low or if an appointment must be rescheduled.   DON'T  . Don't give the dose on an empty stomach.  . Don't exercise for at least 2 hours after the OIT dose. No activity that increases the heart rate or increases body temperature.  . Don't give an escalation dose without calling the office first if it has been more than 24 hours since the last dose.  . Don't come for a dose increase if there is an active illness or asthma flare. Call to reschedule after the illness has resolved.  . Don't treat a mild reaction (a few hives, mouth itch, mild abdominal pain) that resolves within 1 hour.

## 2019-12-19 NOTE — Progress Notes (Signed)
0

## 2019-12-19 NOTE — Progress Notes (Signed)
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  - Continue the following dose until the next visit: 1 mL 25 mg/mL peanut suspension   - The following physician is on call for the next week: Dr. Dellis Anes [phone]). - Feel free to reach out for any questions or concerns.   2.  Return in about 1 week (around 12/19/2019).   Subjective:   Patricia [name] is a 29 y.o. female presenting [name] for follow up of  Chief Complaint  Patient presents with  . OTHER    Peanut    Patricia [name] has a history of the following: Patient Active Problem List   Diagnosis Date Noted  . Fatty liver 11/29/2019  . Diarrhea 09/27/2019  . Elevated LFTs 06/17/2017  . Calculus of gallbladder with acute cholecystitis without obstruction   . Nausea vomiting and diarrhea   . RUQ abdominal pain   . Transaminitis   . Biliary colic   . Gall stones, common bile duct   . Abdominal pain 06/04/2017  . 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 Chronic hypertension in pregnancy 04/13/2017  . Chronic hypertension affecting pregnancy 11/08/2016  . 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 Supervision of high risk pregnancy, antepartum 09/27/2016  . History of gestational hypertension 09/27/2016  . History of shoulder dystocia in prior pregnancy, currently pregnant 09/27/2016  . URI (upper respiratory infection) 08/01/2015  . Persistent cough 08/01/2015  . Obesity, Class III, BMI 40-49.9 (morbid obesity) (HCC) 09/23/2014  . Headache 09/23/2014  . PCO (polycystic ovaries) 03/08/2014    History obtained from: chart review and patient.  Patricia [name] Primary Care Provider is Benita Stabile, MD.     Patricia [name] is a  29 y.o. female presenting to increase her peanut OIT dose. She completed the peanut rapid escalation in December of 2020. Her current dose is 1 mL 25 mcg/mL peanut suspension . Patricia [name] tolerated her dose without oral itching, stomach pain, diarrhea, vomiting, itching or hives.  She had no problems with the dosing over the last week, including throat irritation or stomach pain or any other symptoms.   She did have 3 episodes of diarrhea during the rapid escalation, but she had no other diarrhea following the doses at home.  She has reported some throat irritation from previous doses.   However, over the last week, she has had no problems at all.  She started a new bile acid binder which helped control her symptoms.   She denies any symptoms of eosinophilic esophagitis, including reflux, stomach pain, difficulty swallowing, weight loss or chest pain.   Otherwise, there have been no changes to her past medical history, surgical history, family history, or social history.    Review of Systems: a 14-point review of systems is pertinent for what is mentioned in HPI.  Otherwise, all other systems were negative.  Constitutional: negative other than that listed in the HPI Eyes: negative other than that listed in the HPI Ears, nose, mouth, throat, and face: negative other than that listed in the HPI Respiratory: negative other than that listed in the HPI Cardiovascular: negative other than that listed in the HPI Gastrointestinal: negative other than that listed in the HPI Genitourinary: negative other than that listed  in the HPI Integument: negative other than that listed in the HPI Hematologic: negative other than that listed in the HPI Musculoskeletal: negative other than that listed in the HPI Neurological: negative other than that listed in the HPI Allergy/Immunologic: negative other than that listed in the HPI    Objective:   Blood pressure 116/72, pulse 65, temperature 97.7 F (36.5 C),  temperature source Temporal, resp. rate 18, SpO2 98 %, unknown if currently breastfeeding. There is no height or weight on file to calculate BMI.   Physical Exam:  General: Alert, interactive, in no acute distress.  Talkative. Very pleasant female. Smiling as always.  Eyes: No conjunctival injection bilaterally, no discharge on the right, no discharge on the left and no Horner-Trantas dots present. PERRL bilaterally. EOMI without pain. No photophobia.  Ears: Right TM pearly gray with normal light reflex, Left TM pearly gray with normal light reflex, Right TM intact without perforation and Left TM intact without perforation.  Nose/Throat: External nose within normal limits and septum midline. Turbinates edematous with clear discharge. Posterior oropharynx mildly erythematous without cobblestoning in the posterior oropharynx. Tonsils 2+ without exudates.  Tongue without thrush. Lungs: Clear to auscultation without wheezing, rhonchi or rales. No increased work of breathing.  Moving air well in all lung fields. CV: Normal S1/S2. No murmurs. Capillary refill <2 seconds.  Skin: Warm and dry, without lesions or rashes. Neuro:   Grossly intact. No focal deficits appreciated. Responsive to questions.     Rescue Medications (if needed): Epinephrine dose: 0.3 mg Benadryl dose: 50 mg (20 mL)  Cetirizine dose: 10mg  (45mL)  Patricia [name] was given 1.38mL 25 mg/mL peanut suspension   Time Patricia [name] was given the dose: 8:40 AM Time Patricia [name] was discharged: 9:57 AM  Given the up dosing [name], Patricia [name] will be sent home with the following dose: 1.71mL 25 mg/mL peanut suspension.  [name], MD [facility] [place]

## 2019-12-26 ENCOUNTER — Ambulatory Visit (INDEPENDENT_AMBULATORY_CARE_PROVIDER_SITE_OTHER): Payer: 59 | Admitting: Allergy & Immunology

## 2019-12-26 ENCOUNTER — Other Ambulatory Visit: Payer: Self-pay

## 2019-12-26 ENCOUNTER — Encounter: Payer: Self-pay | Admitting: Allergy & Immunology

## 2019-12-26 VITALS — BP 112/84 | HR 86 | Temp 97.5°F | Resp 18

## 2019-12-26 DIAGNOSIS — T7800XD Anaphylactic reaction due to unspecified food, subsequent encounter: Secondary | ICD-10-CM | POA: Diagnosis not present

## 2019-12-26 NOTE — Patient Instructions (Addendum)
1. 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  - Continue the following dose until the next visit: 2 mL 25 mg/mL peanut suspension   - The following physician is on call for the next week: Dr. Dellis Anes [phone]). - Feel free to reach out for any questions or concerns.   2.  Return in about 1 week (around 01/02/2020).    It was a pleasure to see you again today!  Websites that have reliable patient information: 1. American Academy of Asthma, Allergy, and Immunology: www.aaaai.org 2. Food Allergy Research and Education (FARE): foodallergy.org 3. Mothers of Asthmatics: http://www.asthmacommunitynetwork.org 4. American College of Allergy, Asthma, and Immunology: www.acaai.org    Food Oral Immunotherapy Do's and Don'ts   DO . Give the dose after having at least a snack.  Marland Kitchen Keep liquids refrigerated.  . Give escalation doses 21-27 hours apart.  . Call the office if a dose is missed. Do not give the next dose before getting instructions from our office.  . Call if there are any signs of reaction.  . Give EpiPen or Auvi-Q right away if there are signs of a severe reaction: sneezing, wheezing, cough, shortness of breath, swelling of the mouth or throat, change in voice quality, vomiting or sudden quietness. If there is a single episode of vomiting while or immediately after taking the dose and there are NO other problems, you may observe without treatment but if any other symptoms develop, administer epinephrine immediately.  . Go to the ER right away if epinephrine is given.  . Call before the next dose if there is a new illness.  . Have epinephrine available at all times!!  . Let us know by phone or email about minor problems that occur more than once.  Marland Kitchen Keep track of your doses remaining so that you don't run out unexpectedly.  . Be alert to your OIT child at brother's or sister's soccer game or other sporting event; they are likely to run  around as much as children on the field.  . Call right away for extra dosing solution if the supply is low or if an appointment must be rescheduled.   DON'T  . Don't give the dose on an empty stomach.  . Don't exercise for at least 2 hours after the OIT dose. No activity that increases the heart rate or increases body temperature.  . Don't give an escalation dose without calling the office first if it has been more than 24 hours since the last dose.  . Don't come for a dose increase if there is an active illness or asthma flare. Call to reschedule after the illness has resolved.  . Don't treat a mild reaction (a few hives, mouth itch, mild abdominal pain) that resolves within 1 hour.

## 2019-12-26 NOTE — Progress Notes (Signed)
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 Anaphylaxis to Patricia - on oral immunotherapy - Patricia [name] tolerated her [name] today.  - Continue the following dose until the next visit: 39mL 25 mg/mL Patricia suspension   - The following physician is on call for the next week: Dr. Dellis Anes [phone]). - Feel free to reach out for any questions or concerns.   2.  Return in about 1 week (around 12/19/2019).   Subjective:   Patricia [name] is a 29 y.o. female presenting today for follow up of  Chief Complaint  Patient presents with  . Allergic Reaction    Peanuts    Patricia [name] has a history of the following: Patient Active Problem List   Diagnosis Date Noted  . Fatty liver 11/29/2019  . Diarrhea 09/27/2019  . Elevated LFTs 06/17/2017  . Calculus of gallbladder with acute cholecystitis without obstruction   . Nausea vomiting and diarrhea   . RUQ abdominal pain   . Transaminitis   . Biliary colic   . Gall stones, common bile duct   . Abdominal pain 06/04/2017  . 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 Chronic hypertension in pregnancy 04/13/2017  . Chronic hypertension affecting pregnancy 11/08/2016  . 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 Supervision of high risk pregnancy, antepartum 09/27/2016  . History of gestational hypertension 09/27/2016  . History of shoulder dystocia in prior pregnancy, currently pregnant 09/27/2016  . URI (upper respiratory infection) 08/01/2015  . Persistent cough 08/01/2015  . Obesity, Class III, BMI 40-49.9 (morbid obesity) (HCC) 09/23/2014  . Headache 09/23/2014  . PCO (polycystic ovaries) 03/08/2014    History obtained from: chart review and patient.  Patricia [name] Primary Care Provider is Benita Stabile, MD.     Patricia [name] is a 29 y.o. female presenting to increase her Patricia OIT dose. She completed the Patricia rapid escalation in December of 2020. Her current dose is 1 mL 25 mcg/mL Patricia suspension . Patricia [name] tolerated her dose without oral itching, stomach pain, diarrhea, vomiting, itching or hives.  She had no problems with the dosing over the last week, including throat irritation or stomach pain or any other symptoms. She has tolerated her updoses over the past week without any problems at all.   She did have 3 episodes of diarrhea during the rapid escalation, but she had no other diarrhea following the doses at home.  She has reported some throat irritation from previous doses.   She denies any symptoms of eosinophilic esophagitis, including reflux, stomach pain, difficulty swallowing, weight loss or chest pain.   Otherwise, there have been no changes to her past medical history, surgical history, family history, or social history.    Review of Systems: a 14-point review of systems is pertinent for what is mentioned in HPI.  Otherwise, all other systems were negative.  Constitutional: negative other than that listed in the HPI Eyes: negative other than that listed in the HPI Ears, nose, mouth, throat, and face: negative other than that listed in the HPI Respiratory: negative other than that listed in the HPI Cardiovascular: negative other than that listed in the HPI Gastrointestinal: negative other than that listed in the HPI Genitourinary: negative other than that listed in the HPI Integument: negative other than that listed in the HPI Hematologic:  negative other than that listed in the HPI Musculoskeletal: negative other than that listed in the HPI Neurological: negative other than that listed in the HPI Allergy/Immunologic: negative other than that listed in the HPI    Objective:   Blood pressure 126/74, pulse 90, temperature (!) 97.5 F (36.4 C), temperature source Temporal, resp. rate 18,  SpO2 97 %, unknown if currently breastfeeding. There is no height or weight on file to calculate BMI.   Physical Exam:  General: Alert, interactive, in no acute distress.  Talkative. Very pleasant female. Smiling as always.   Eyes: No conjunctival injection bilaterally, no discharge on the right, no discharge on the left and no Horner-Trantas dots present. PERRL bilaterally. EOMI without pain. No photophobia.  Ears: Right TM pearly gray with normal light reflex, Left TM pearly gray with normal light reflex, Right TM intact without perforation and Left TM intact without perforation.  Nose/Throat: External nose within normal limits and septum midline. Turbinates edematous with clear discharge. Posterior oropharynx unremarkable without cobblestoning in the posterior oropharynx. Tonsils 2+ without exudates.  Tongue without thrush. Lungs: Clear to auscultation without wheezing, rhonchi or rales. No increased work of breathing.  Moving air well in all lung fields. CV: Normal S1/S2. No murmurs. Capillary refill <2 seconds.  Skin: Warm and dry, without lesions or rashes. She has not excoriations present.  Neuro:   Grossly intact. No focal deficits appreciated. Responsive to questions.     Rescue Medications (if needed): Epinephrine dose: 0.3 mg Benadryl dose: 50 mg (20 mL)  Cetirizine dose: 10mg  (37mL)  Patricia [name] was given 2 mL 25 mg/mL Patricia suspension   Time Patricia [name] was given the dose: 8:55 AM Time Patricia [name] was discharged: 10:05 AM  Given the up dosing today, Patricia [name] will be sent home with the following dose: 2 mL 25 mg/mL Patricia suspension.  [name], MD [facility] [place] [facility] [place]

## 2020-01-02 ENCOUNTER — Ambulatory Visit: Payer: 59 | Admitting: Allergy & Immunology

## 2020-01-09 ENCOUNTER — Other Ambulatory Visit: Payer: Self-pay

## 2020-01-09 ENCOUNTER — Ambulatory Visit (INDEPENDENT_AMBULATORY_CARE_PROVIDER_SITE_OTHER): Payer: 59 | Admitting: Allergy & Immunology

## 2020-01-09 ENCOUNTER — Encounter: Payer: Self-pay | Admitting: Allergy & Immunology

## 2020-01-09 VITALS — BP 138/76 | HR 100 | Temp 97.9°F | Resp 18

## 2020-01-09 DIAGNOSIS — T7800XD Anaphylactic reaction due to unspecified food, subsequent encounter: Secondary | ICD-10-CM

## 2020-01-09 NOTE — Progress Notes (Signed)
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  - Continue the following dose until the next visit: 2 mL 25 mg/mL peanut suspension   - The following physician is on call for the next week: Dr. Ernst Bowler [phone]). - Feel free to reach out for any questions or concerns.   2.  Return in about 1 week (around 01/16/2020).  Subjective:   Patricia [name] is a 29 y.o. female presenting today for follow up of  Chief Complaint  Patient presents with  . Immunotherapy    OIT updosing    Patricia [name] has a history of the following: Patient Active Problem List   Diagnosis Date Noted  . Fatty liver 11/29/2019  . Diarrhea 09/27/2019  . Elevated LFTs 06/17/2017  . Calculus of gallbladder with acute cholecystitis without obstruction   . Nausea vomiting and diarrhea   . RUQ abdominal pain   . Transaminitis   . Biliary colic   . Gall stones, common bile duct   . Abdominal pain 06/04/2017  . 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 Chronic hypertension in pregnancy 04/13/2017  . Chronic hypertension affecting pregnancy 11/08/2016  . 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 Supervision of high risk pregnancy, antepartum 09/27/2016  . History of gestational hypertension 09/27/2016  . History of shoulder dystocia in prior pregnancy, currently pregnant 09/27/2016  . URI (upper respiratory infection) 08/01/2015  . Persistent cough 08/01/2015  . Obesity, Class III, BMI 40-49.9 (morbid obesity) ([place]) 09/23/2014  . Headache 09/23/2014  . PCO (polycystic ovaries) 03/08/2014    History obtained from: chart review and patient.  Patricia [name] Primary Care Provider is Celene Squibb, MD.     Patricia [name] is a 29 y.o. female presenting to increase her peanut OIT dose. She completed the peanut rapid escalation in December of 2020. Her current dose is 1.81mL 25 mg/mL peanut suspension. Patricia [name] tolerated her current dose without oral itching, stomach pain, diarrhea, vomiting, itching or hives.  However, and her last dose, we will increase to 2 mL of the 25 mg/mL suspension.  She called me over the weekend and reported that she has had vomiting and hives with one of the doses.  Therefore we decreased to 1.5 mL and continued her on that for approximately 10 days until today.  On this 1.5 mL, she has done well.  She denies any symptoms of eosinophilic esophagitis, including reflux, stomach pain, difficulty swallowing, weight loss or chest pain.    Otherwise, there have been no changes to her past medical history, surgical history, family history, or social history.    Review of Systems: a 14-point review of systems is pertinent for what is mentioned in HPI.  Otherwise, all other systems were negative.  Constitutional: negative other than that listed in the HPI Eyes: negative other than that listed in the HPI Ears, nose, mouth, throat, and face: negative other than that listed in the HPI Respiratory: negative other than that listed in the HPI Cardiovascular: negative other than that listed in the HPI Gastrointestinal: negative other than that listed in the HPI Genitourinary: negative other than that listed in the HPI Integument: negative other than that listed in the HPI Hematologic: negative other than that listed in  the HPI Musculoskeletal: negative other than that listed in the HPI Neurological: negative other than that listed in the HPI Allergy/Immunologic: negative other than that listed in the HPI    Objective:   Blood pressure 138/76, pulse 100, temperature 97.9 F (36.6 C), temperature source Temporal, resp. rate 18, SpO2 99 %, unknown if currently breastfeeding. There is no height  or weight on file to calculate BMI.   Physical Exam:  General: Alert, interactive, in no acute distress. Pleasant talkative female.  Eyes: No conjunctival injection present on the left, No conjunctival injection bilaterally and allergic shiners present bilaterally. PERRL bilaterally. EOMI without pain. No photophobia.  Ears: Right TM pearly gray with normal light reflex, Left TM pearly gray with normal light reflex, Right TM intact without perforation and Left TM intact without perforation.  Nose/Throat: External nose within normal limits and septum midline. Turbinates edematous with clear discharge. Posterior oropharynx mildly erythematous without cobblestoning in the posterior oropharynx. Tonsils 2+ without exudates.  Tongue without thrush. Lungs: Clear to auscultation without wheezing, rhonchi or rales. No increased work of breathing. CV: Normal S1/S2. No murmurs. Capillary refill <2 seconds.  Skin: Warm and dry, without lesions or rashes. Neuro:   Grossly intact. No focal deficits appreciated. Responsive to questions.    Spirometry: N/A  Rescue Medications (if needed):  Epinephrine dose: 0.3 mg Benadryl dose: 50 mg (20 mL)  Patricia [name] was given 2 mL 25 mg/mL peanut suspension.  Time Patricia [name] was given the dose: 8:45 AM Time Patricia [name] was discharged: 9:55 AM  Given the up dosing today, Patricia [name] will be sent home with the following dose: 2 mL 25 mg/mL peanut suspension.    Patricia Bonds, MD [facility]

## 2020-01-09 NOTE — Patient Instructions (Addendum)
1. 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  - Continue the following dose until the next visit: 2 mL 25 mg/mL peanut suspension   - The following physician is on call for the next week: Dr. Dellis Anes [phone]). - Feel free to reach out for any questions or concerns.   2.  Return in about 1 week (around 01/16/2020).    It was a pleasure to see you again today!  Websites that have reliable patient information: 1. American Academy of Asthma, Allergy, and Immunology: www.aaaai.org 2. Food Allergy Research and Education (FARE): foodallergy.org 3. Mothers of Asthmatics: http://www.asthmacommunitynetwork.org 4. American College of Allergy, Asthma, and Immunology: www.acaai.org    Food Oral Immunotherapy Do's and Don'ts   DO . Give the dose after having at least a snack.  Marland Kitchen Keep liquids refrigerated.  . Give escalation doses 21-27 hours apart.  . Call the office if a dose is missed. Do not give the next dose before getting instructions from our office.  . Call if there are any signs of reaction.  . Give EpiPen or Auvi-Q right away if there are signs of a severe reaction: sneezing, wheezing, cough, shortness of breath, swelling of the mouth or throat, change in voice quality, vomiting or sudden quietness. If there is a single episode of vomiting while or immediately after taking the dose and there are NO other problems, you may observe without treatment but if any other symptoms develop, administer epinephrine immediately.  . Go to the ER right away if epinephrine is given.  . Call before the next dose if there is a new illness.  . Have epinephrine available at all times!!  . Let us know by phone or email about minor problems that occur more than once.  Marland Kitchen Keep track of your doses remaining so that you don't run out unexpectedly.  . Be alert to your OIT child at brother's or sister's soccer game or other sporting event; they are likely to run  around as much as children on the field.  . Call right away for extra dosing solution if the supply is low or if an appointment must be rescheduled.   DON'T  . Don't give the dose on an empty stomach.  . Don't exercise for at least 2 hours after the OIT dose. No activity that increases the heart rate or increases body temperature.  . Don't give an escalation dose without calling the office first if it has been more than 24 hours since the last dose.  . Don't come for a dose increase if there is an active illness or asthma flare. Call to reschedule after the illness has resolved.  . Don't treat a mild reaction (a few hives, mouth itch, mild abdominal pain) that resolves within 1 hour.

## 2020-01-16 ENCOUNTER — Encounter: Payer: Self-pay | Admitting: Allergy & Immunology

## 2020-01-16 ENCOUNTER — Other Ambulatory Visit: Payer: Self-pay

## 2020-01-16 ENCOUNTER — Ambulatory Visit (INDEPENDENT_AMBULATORY_CARE_PROVIDER_SITE_OTHER): Payer: 59 | Admitting: Allergy & Immunology

## 2020-01-16 VITALS — BP 138/96 | HR 95 | Resp 18

## 2020-01-16 DIAGNOSIS — T7800XD Anaphylactic reaction due to unspecified food, subsequent encounter: Secondary | ICD-10-CM | POA: Diagnosis not present

## 2020-01-16 MED ORDER — CLOBETASOL PROPIONATE 0.05 % EX OINT: 1.0000 "application " | TOPICAL_OINTMENT | Freq: Two times a day (BID) | CUTANEOUS | 0 refills | Status: DC

## 2020-01-16 NOTE — Patient Instructions (Addendum)
1. 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  - Continue the following dose until the next visit: 64 mg peanut flour - The following physician is on call for the next week: Dr. Dellis Anes [phone]). - Feel free to reach out for any questions or concerns.   2.  Return in about 1 week (around 01/23/2020).   It was a pleasure to see you again today!  Websites that have reliable patient information: 1. American Academy of Asthma, Allergy, and Immunology: www.aaaai.org 2. Food Allergy Research and Education (FARE): foodallergy.org 3. Mothers of Asthmatics: http://www.asthmacommunitynetwork.org 4. American College of Allergy, Asthma, and Immunology: www.acaai.org    Food Oral Immunotherapy Do's and Don'ts   DO . Give the dose after having at least a snack.  Marland Kitchen Keep liquids refrigerated.  . Give escalation doses 21-27 hours apart.  . Call the office if a dose is missed. Do not give the next dose before getting instructions from our office.  . Call if there are any signs of reaction.  . Give EpiPen or Auvi-Q right away if there are signs of a severe reaction: sneezing, wheezing, cough, shortness of breath, swelling of the mouth or throat, change in voice quality, vomiting or sudden quietness. If there is a single episode of vomiting while or immediately after taking the dose and there are NO other problems, you may observe without treatment but if any other symptoms develop, administer epinephrine immediately.  . Go to the ER right away if epinephrine is given.  . Call before the next dose if there is a new illness.  . Have epinephrine available at all times!!  . Let us know by phone or email about minor problems that occur more than once.  Marland Kitchen Keep track of your doses remaining so that you don't run out unexpectedly.  . Be alert to your OIT child at brother's or sister's soccer game or other sporting event; they are likely to run around as much as  children on the field.  . Call right away for extra dosing solution if the supply is low or if an appointment must be rescheduled.   DON'T  . Don't give the dose on an empty stomach.  . Don't exercise for at least 2 hours after the OIT dose. No activity that increases the heart rate or increases body temperature.  . Don't give an escalation dose without calling the office first if it has been more than 24 hours since the last dose.  . Don't come for a dose increase if there is an active illness or asthma flare. Call to reschedule after the illness has resolved.  . Don't treat a mild reaction (a few hives, mouth itch, mild abdominal pain) that resolves within 1 hour.

## 2020-01-16 NOTE — Progress Notes (Signed)
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  - Continue the following dose until the next visit: 64 mg peanut flour - The following physician is on call for the next week: Dr. Dellis Anes [phone]). - Feel free to reach out for any questions or concerns.   2.  Return in about 1 week (around 01/23/2020).   Subjective:   Patricia [name] is a 29 y.o. female presenting today for follow up of No chief complaint on file.   Patricia [name] has a history of the following: Patient Active Problem List   Diagnosis Date Noted  . Fatty liver 11/29/2019  . Diarrhea 09/27/2019  . Elevated LFTs 06/17/2017  . Calculus of gallbladder with acute cholecystitis without obstruction   . Nausea vomiting and diarrhea   . RUQ abdominal pain   . Transaminitis   . Biliary colic   . Gall stones, common bile duct   . Abdominal pain 06/04/2017  . 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 Chronic hypertension in pregnancy 04/13/2017  . Chronic hypertension affecting pregnancy 11/08/2016  . 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 Supervision of high risk pregnancy, antepartum 09/27/2016  . History of gestational hypertension 09/27/2016  . History of shoulder dystocia in prior pregnancy, currently pregnant 09/27/2016  . URI (upper respiratory infection) 08/01/2015  . Persistent cough 08/01/2015  . Obesity, Class III, BMI 40-49.9 (morbid obesity) (HCC) 09/23/2014  . Headache 09/23/2014  . PCO (polycystic ovaries) 03/08/2014    History obtained from: chart review and patient.  Patricia [name] Primary Care Provider is Benita Stabile, MD.     Patricia [name] is a 29 y.o. female presenting to increase her peanut OIT  dose. She completed the peanut rapid escalation in December of 2020. Her current dose is 2 mL 25 mg/mL peanut suspension  Patricia [name] tolerated her dose without oral itching, stomach pain, diarrhea, vomiting, itching or hives.   She remains on the Dupixent and is doing well with that. Her worst areas  On her hands and these lesions are resolving. There is no more cracking and the lesions seems to be getting smaller. She continues to put on triamcinolone twice daily and is using the [employer] brand of Eucerin. She has not tried using any stronger steroid.   She denies any symptoms of eosinophilic esophagitis, including reflux, stomach pain, difficulty swallowing, weight loss or chest pain.   Otherwise, there have been no changes to her past medical history, surgical history, family history, or social history.    Review of Systems: a 14-point review of systems is pertinent for what is mentioned in HPI.  Otherwise, all other systems were negative.  Constitutional: negative other than that listed in the HPI Eyes: negative other than that listed in the HPI Ears, nose, mouth, throat, and face: negative other than that listed in the HPI Respiratory: negative other than that listed in the HPI Cardiovascular: negative other than that listed in the HPI Gastrointestinal: negative other than that listed in the HPI Genitourinary: negative other than that listed in the HPI Integument: negative other than that listed in the HPI Hematologic: negative other than that listed in the HPI Musculoskeletal: negative other than that listed in the HPI Neurological: negative other than that  listed in the HPI Allergy/Immunologic: negative other than that listed in the HPI    Objective:   Blood pressure (!) 138/96, pulse 95, resp. rate 18, SpO2 98 %, unknown if currently breastfeeding. There is no height or weight on file to calculate BMI.   Physical Exam:  General: Alert, interactive, in no acute distress. Pleasant  and talkative.  Eyes: No conjunctival injection bilaterally, no discharge on the right, no discharge on the left, no Horner-Trantas dots present and allergic shiners present bilaterally. PERRL bilaterally. EOMI without pain. No photophobia.  Ears: Right TM pearly gray with normal light reflex, Left TM pearly gray with normal light reflex, Right TM intact without perforation and Left TM intact without perforation.  Nose/Throat: External nose within normal limits and septum midline. Turbinates edematous and pale without discharge. Posterior oropharynx erythematous without cobblestoning in the posterior oropharynx. Tonsils 2+ without exudates.  Tongue without thrush. Lungs: Clear to auscultation without wheezing, rhonchi or rales. No increased work of breathing. CV: Normal S1/S2. No murmurs. Capillary refill <2 seconds.  Skin: Warm and dry, without lesions or rashes. Neuro:   Grossly intact. No focal deficits appreciated. Responsive to questions.    Spirometry: N/A  Rescue Medications (if needed):  Epinephrine dose: 0.3 mg Benadryl dose: 50 mg (20 mL)  Patricia [name] was given  64 mg peanut flour.  Time Patricia [name] was given the dose: 8:37 AM Time Patricia [name] was discharged: 9:48 AM  Given the up dosing today, Patricia [name] will be sent home with the following dose:  64 mg peanut flour.   [name], MD [facility] [place]

## 2020-01-23 ENCOUNTER — Encounter: Payer: Self-pay | Admitting: Allergy & Immunology

## 2020-01-23 ENCOUNTER — Ambulatory Visit (INDEPENDENT_AMBULATORY_CARE_PROVIDER_SITE_OTHER): Payer: 59 | Admitting: Allergy & Immunology

## 2020-01-23 ENCOUNTER — Other Ambulatory Visit: Payer: Self-pay

## 2020-01-23 VITALS — BP 142/70 | HR 85 | Temp 97.8°F

## 2020-01-23 DIAGNOSIS — T7800XD Anaphylactic reaction due to unspecified food, subsequent encounter: Secondary | ICD-10-CM | POA: Diagnosis not present

## 2020-01-23 NOTE — Progress Notes (Signed)
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  - Continue the following dose until the next visit: 128 mg peanut flour - The following physician is on call for the next week: Patricia [name] [phone]). - Feel free to reach out for any questions or concerns.   2.  Return in about 1 week (around 01/30/2020).   Subjective:   Patricia [name] is a 29 y.o. female presenting today for follow up of No chief complaint on file.   Patricia [name] has a history of the following: Patient Active Problem List   Diagnosis Date Noted  . Fatty liver 11/29/2019  . Diarrhea 09/27/2019  . Elevated LFTs 06/17/2017  . Calculus of gallbladder with acute cholecystitis without obstruction   . Nausea vomiting and diarrhea   . RUQ abdominal pain   . Transaminitis   . Biliary colic   . Gall stones, common bile duct   . Abdominal pain 06/04/2017  . 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 Chronic hypertension in pregnancy 04/13/2017  . Chronic hypertension affecting pregnancy 11/08/2016  . 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 Supervision of high risk pregnancy, antepartum 09/27/2016  . History of gestational hypertension 09/27/2016  . History of shoulder dystocia in prior pregnancy, currently pregnant 09/27/2016  . URI (upper respiratory infection) 08/01/2015  . Persistent cough 08/01/2015  . Obesity, Class III, BMI 40-49.9 (morbid obesity) (HCC) 09/23/2014  . Headache 09/23/2014  . PCO (polycystic ovaries) 03/08/2014    History obtained from: chart review and patient.  Patricia [name] Primary Care Provider is Patricia [name], Patricia [name].     Patricia [name] is a 29 y.o. female presenting to increase her peanut OIT  dose. She completed the peanut rapid escalation in December of 2020. Her current dose is 64 mg peanut flour. Patricia [name] tolerated her dose without oral itching, stomach pain, diarrhea, vomiting, itching or hives. We did add on a stronger topical steroid to help with the eczematous lesion on her right hand. This is improving without a problem.   She denies any symptoms of eosinophilic esophagitis, including reflux, stomach pain, difficulty swallowing, weight loss or chest pain.   Otherwise, there have been no changes to her past medical history, surgical history, family history, or social history.    Review of Systems: a 14-point review of systems is pertinent for what is mentioned in HPI.  Otherwise, all other systems were negative.  Constitutional: negative other than that listed in the HPI Eyes: negative other than that listed in the HPI Ears, nose, mouth, throat, and face: negative other than that listed in the HPI Respiratory: negative other than that listed in the HPI Cardiovascular: negative other than that listed in the HPI Gastrointestinal: negative other than that listed in the HPI Genitourinary: negative other than that listed in the HPI Integument: negative other than that listed in the HPI Hematologic: negative other than that listed in the HPI Musculoskeletal: negative other than that listed in the HPI Neurological: negative other than that listed in the HPI Allergy/Immunologic: negative other than that listed in the HPI    Objective:   Blood pressure (!) 142/70, pulse 85, temperature 97.8 F (36.6 C), temperature source Temporal, SpO2 100 %, unknown if currently breastfeeding. There  is no height or weight on file to calculate BMI.   Physical Exam:  General: Alert, interactive, in no acute distress. Pleasant female.  Eyes: No conjunctival injection bilaterally, no discharge on the right, no discharge on the left and no Horner-Trantas dots present. PERRL bilaterally. EOMI  without pain. No photophobia.  Ears: Right TM pearly gray with normal light reflex, Left TM pearly gray with normal light reflex, Right TM intact without perforation and Left TM intact without perforation.  Nose/Throat: External nose within normal limits, nasal crease present and septum midline. Turbinates minimally edematous without discharge. Posterior oropharynx mildly erythematous without cobblestoning in the posterior oropharynx. Tonsils 2+ without exudates.  Tongue without thrush. Lungs: Clear to auscultation without wheezing, rhonchi or rales. No increased work of breathing. CV: Normal S1/S2. No murmurs. Capillary refill <2 seconds.  Skin: Dry, erythematous, excoriated patches on the bilateral hands, but these are improved compared to last week.. Neuro:   Grossly intact. No focal deficits appreciated. Responsive to questions.    Spirometry: N/A  Rescue Medications (if needed):  Epinephrine dose: 0.3 mg Benadryl dose: 50 mg (20 mL)  Patricia [name] was given 128 mg peanut flour.  Patricia [name] was given the dose: 08:46 AM Patricia [name] was discharged: 09:58 AM  Given the up dosing today, Patricia [name] will be sent home with the following dose: 128 mg peanut flour.   Patricia [name], Patricia [name] [facility] [place]

## 2020-01-23 NOTE — Patient Instructions (Addendum)
1. 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  - Continue the following dose until the next visit: 128 mg peanut flour - The following physician is on call for the next week: Dr. Delorse Lek [phone]). - Feel free to reach out for any questions or concerns.   2.  Return in about 1 week (around 01/30/2020).   It was a pleasure to see you again today!  Websites that have reliable patient information: 1. American Academy of Asthma, Allergy, and Immunology: www.aaaai.org 2. Food Allergy Research and Education (FARE): foodallergy.org 3. Mothers of Asthmatics: http://www.asthmacommunitynetwork.org 4. American College of Allergy, Asthma, and Immunology: www.acaai.org    Food Oral Immunotherapy Do's and Don'ts   DO . Give the dose after having at least a snack.  Marland Kitchen Keep liquids refrigerated.  . Give escalation doses 21-27 hours apart.  . Call the office if a dose is missed. Do not give the next dose before getting instructions from our office.  . Call if there are any signs of reaction.  . Give EpiPen or Auvi-Q right away if there are signs of a severe reaction: sneezing, wheezing, cough, shortness of breath, swelling of the mouth or throat, change in voice quality, vomiting or sudden quietness. If there is a single episode of vomiting while or immediately after taking the dose and there are NO other problems, you may observe without treatment but if any other symptoms develop, administer epinephrine immediately.  . Go to the ER right away if epinephrine is given.  . Call before the next dose if there is a new illness.  . Have epinephrine available at all times!!  . Let us know by phone or email about minor problems that occur more than once.  Marland Kitchen Keep track of your doses remaining so that you don't run out unexpectedly.  . Be alert to your OIT child at brother's or sister's soccer game or other sporting event; they are likely to run around as much as  children on the field.  . Call right away for extra dosing solution if the supply is low or if an appointment must be rescheduled.   DON'T  . Don't give the dose on an empty stomach.  . Don't exercise for at least 2 hours after the OIT dose. No activity that increases the heart rate or increases body temperature.  . Don't give an escalation dose without calling the office first if it has been more than 24 hours since the last dose.  . Don't come for a dose increase if there is an active illness or asthma flare. Call to reschedule after the illness has resolved.  . Don't treat a mild reaction (a few hives, mouth itch, mild abdominal pain) that resolves within 1 hour.

## 2020-01-30 ENCOUNTER — Encounter: Payer: Self-pay | Admitting: Allergy & Immunology

## 2020-01-30 ENCOUNTER — Other Ambulatory Visit: Payer: Self-pay

## 2020-01-30 ENCOUNTER — Ambulatory Visit (INDEPENDENT_AMBULATORY_CARE_PROVIDER_SITE_OTHER): Payer: 59 | Admitting: Allergy & Immunology

## 2020-01-30 VITALS — BP 142/90 | HR 73 | Temp 98.2°F | Resp 16

## 2020-01-30 DIAGNOSIS — T7800XD Anaphylactic reaction due to unspecified food, subsequent encounter: Secondary | ICD-10-CM | POA: Diagnosis not present

## 2020-01-30 NOTE — Patient Instructions (Addendum)
1. 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  - Continue the following dose until the next visit: 192 mg peanut flour - The following physician is on call for the next week: Dr. Dellis Anes [phone]). - Feel free to reach out for any questions or concerns.   2.  Return in about 1 week (around 02/06/2020).   It was a pleasure to see you again today!  Websites that have reliable patient information: 1. American Academy of Asthma, Allergy, and Immunology: www.aaaai.org 2. Food Allergy Research and Education (FARE): foodallergy.org 3. Mothers of Asthmatics: http://www.asthmacommunitynetwork.org 4. American College of Allergy, Asthma, and Immunology: www.acaai.org    Food Oral Immunotherapy Do's and Don'ts   DO . Give the dose after having at least a snack.  Marland Kitchen Keep liquids refrigerated.  . Give escalation doses 21-27 hours apart.  . Call the office if a dose is missed. Do not give the next dose before getting instructions from our office.  . Call if there are any signs of reaction.  . Give EpiPen or Auvi-Q right away if there are signs of a severe reaction: sneezing, wheezing, cough, shortness of breath, swelling of the mouth or throat, change in voice quality, vomiting or sudden quietness. If there is a single episode of vomiting while or immediately after taking the dose and there are NO other problems, you may observe without treatment but if any other symptoms develop, administer epinephrine immediately.  . Go to the ER right away if epinephrine is given.  . Call before the next dose if there is a new illness.  . Have epinephrine available at all times!!  . Let us know by phone or email about minor problems that occur more than once.  Marland Kitchen Keep track of your doses remaining so that you don't run out unexpectedly.  . Be alert to your OIT child at brother's or sister's soccer game or other sporting event; they are likely to run around as much as  children on the field.  . Call right away for extra dosing solution if the supply is low or if an appointment must be rescheduled.   DON'T  . Don't give the dose on an empty stomach.  . Don't exercise for at least 2 hours after the OIT dose. No activity that increases the heart rate or increases body temperature.  . Don't give an escalation dose without calling the office first if it has been more than 24 hours since the last dose.  . Don't come for a dose increase if there is an active illness or asthma flare. Call to reschedule after the illness has resolved.  . Don't treat a mild reaction (a few hives, mouth itch, mild abdominal pain) that resolves within 1 hour.

## 2020-01-30 NOTE — Progress Notes (Signed)
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  - Continue the following dose until the next visit: 192 mg peanut flour - The following physician is on call for the next week: Dr. Nelva Bush [phone]). - Feel free to reach out for any questions or concerns.   2.  Return in about 1 week.   Subjective:   Patricia [name] is a 29 y.o. female presenting today for follow up of No chief complaint on file.   Patricia [name] has a history of the following: Patient Active Problem List   Diagnosis Date Noted  . Fatty liver 11/29/2019  . Diarrhea 09/27/2019  . Elevated LFTs 06/17/2017  . Calculus of gallbladder with acute cholecystitis without obstruction   . Nausea vomiting and diarrhea   . RUQ abdominal pain   . Transaminitis   . Biliary colic   . Gall stones, common bile duct   . Abdominal pain 06/04/2017  . 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 Chronic hypertension in pregnancy 04/13/2017  . Chronic hypertension affecting pregnancy 11/08/2016  . 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 Supervision of high risk pregnancy, antepartum 09/27/2016  . History of gestational hypertension 09/27/2016  . History of shoulder dystocia in prior pregnancy, currently pregnant 09/27/2016  . URI (upper respiratory infection) 08/01/2015  . Persistent cough 08/01/2015  . Obesity, Class III, BMI 40-49.9 (morbid obesity) ([place]) 09/23/2014  . Headache 09/23/2014  . PCO (polycystic ovaries) 03/08/2014    History obtained from: chart review and patient.  Patricia [name] Primary Care Provider is Celene Squibb, MD.     Patricia [name] is a 29 y.o. female presenting to increase her peanut OIT dose. She completed  the peanut rapid escalation in December of 2020. Her current dose is 64 mg peanut flour. Patricia [name] tolerated her dose without oral itching, stomach pain, diarrhea, vomiting, itching or hives. We did add on a stronger topical steroid to help with the eczematous lesion on her right hand. This is improving without a problem.   She denies any symptoms of eosinophilic esophagitis, including reflux, stomach pain, difficulty swallowing, weight loss or chest pain.   Otherwise, there have been no changes to her past medical history, surgical history, family history, or social history.    Review of Systems: a 14-point review of systems is pertinent for what is mentioned in HPI.  Otherwise, all other systems were negative.  Constitutional: negative other than that listed in the HPI Eyes: negative other than that listed in the HPI Ears, nose, mouth, throat, and face: negative other than that listed in the HPI Respiratory: negative other than that listed in the HPI Cardiovascular: negative other than that listed in the HPI Gastrointestinal: negative other than that listed in the HPI Genitourinary: negative other than that listed in the HPI Integument: negative other than that listed in the HPI Hematologic: negative other than that listed in the HPI Musculoskeletal: negative other than that listed in the HPI Neurological: negative other than that listed in the HPI Allergy/Immunologic: negative other than that listed in the HPI    Objective:   unknown if currently breastfeeding. There is no height or weight on file to calculate BMI.   Physical Exam:  General: Alert, interactive, in  no acute distress. Pleasant female.  Eyes: No conjunctival injection bilaterally, no discharge on the right, no discharge on the left and no Horner-Trantas dots present. PERRL bilaterally. EOMI without pain. No photophobia.  Ears: Right TM pearly gray with normal light reflex, Left TM pearly gray with normal light reflex,  Right TM intact without perforation and Left TM intact without perforation.  Nose/Throat: External nose within normal limits, nasal crease present and septum midline. Turbinates minimally edematous without discharge. Posterior oropharynx mildly erythematous without cobblestoning in the posterior oropharynx. Tonsils 2+ without exudates.  Tongue without thrush. Lungs: Clear to auscultation without wheezing, rhonchi or rales. No increased work of breathing. CV: Normal S1/S2. No murmurs. Capillary refill <2 seconds.  Skin: Dry, erythematous, excoriated patches on the bilateral hands, but these are improved compared to last week.. Neuro:   Grossly intact. No focal deficits appreciated. Responsive to questions.    Spirometry: N/A  Rescue Medications (if needed):  Epinephrine dose: 0.3 mg Benadryl dose: 50 mg (20 mL)  Patricia [name] was given 128 mg peanut flour.  Time Patricia [name] was given the dose: 08:38 AM Time Patricia [name] was discharged: 09:48 AM  Given the up dosing today, Patricia [name] will be sent home with the following dose: 192 mg peanut flour.   [name], MD [facility]

## 2020-02-06 ENCOUNTER — Encounter: Payer: Self-pay | Admitting: Allergy & Immunology

## 2020-02-06 ENCOUNTER — Ambulatory Visit (INDEPENDENT_AMBULATORY_CARE_PROVIDER_SITE_OTHER): Payer: 59 | Admitting: Allergy & Immunology

## 2020-02-06 ENCOUNTER — Other Ambulatory Visit: Payer: Self-pay

## 2020-02-06 VITALS — BP 130/90 | HR 80 | Temp 97.7°F | Resp 16

## 2020-02-06 DIAGNOSIS — T7800XD Anaphylactic reaction due to unspecified food, subsequent encounter: Secondary | ICD-10-CM | POA: Diagnosis not present

## 2020-02-06 NOTE — Progress Notes (Signed)
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  - Continue the following dose until the next visit: 257 mg peanut flour - The following physician is on call for the next week: Dr. Nelva Bush [phone]). - Feel free to reach out for any questions or concerns.   2.  Return in about 1 week (around 02/13/2020).   Subjective:   Patricia [name] is a 29 y.o. female presenting today for follow up of  Chief Complaint  Patient presents with  . OIT    Peanut    Patricia [name] has a history of the following: Patient Active Problem List   Diagnosis Date Noted  . Fatty liver 11/29/2019  . Diarrhea 09/27/2019  . Elevated LFTs 06/17/2017  . Calculus of gallbladder with acute cholecystitis without obstruction   . Nausea vomiting and diarrhea   . RUQ abdominal pain   . Transaminitis   . Biliary colic   . Gall stones, common bile duct   . Abdominal pain 06/04/2017  . 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 Chronic hypertension in pregnancy 04/13/2017  . Chronic hypertension affecting pregnancy 11/08/2016  . 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 Supervision of high risk pregnancy, antepartum 09/27/2016  . History of gestational hypertension 09/27/2016  . History of shoulder dystocia in prior pregnancy, currently pregnant 09/27/2016  . URI (upper respiratory infection) 08/01/2015  . Persistent cough 08/01/2015  . Obesity, Class III, BMI 40-49.9 (morbid obesity) ([place]) 09/23/2014  . Headache 09/23/2014  . PCO (polycystic ovaries) 03/08/2014    History obtained from: chart review and patient.  Patricia [name] Primary Care Provider is Celene Squibb, MD.     Patricia [name] is a 29 y.o. female presenting to  increase her peanut OIT dose. She completed the peanut rapid escalation in December of 2020. Her current dose is 192 mg peanut flour. Patricia [name] tolerated her dose without oral itching, stomach pain, diarrhea, vomiting, itching or hives.  She remains on her Dupixent every 2 weeks for her eczema.  We started this concurrently with her oral immunotherapy to see if it would help her to tolerate the dosing.  Thus far, it seems to have worked.  She denies any symptoms of eosinophilic esophagitis, including reflux, stomach pain, difficulty swallowing, weight loss or chest pain.   Otherwise, there have been no changes to her past medical history, surgical history, family history, or social history.    Review of Systems: a 14-point review of systems is pertinent for what is mentioned in HPI.  Otherwise, all other systems were negative.  Constitutional: negative other than that listed in the HPI Eyes: negative other than that listed in the HPI Ears, nose, mouth, throat, and face: negative other than that listed in the HPI Respiratory: negative other than that listed in the HPI Cardiovascular: negative other than that listed in the HPI Gastrointestinal: negative other than that listed in the HPI Genitourinary: negative other than that listed in the HPI Integument: negative other than that listed in the HPI Hematologic: negative other than that listed in the HPI Musculoskeletal: negative other than that listed in the HPI Neurological: negative other than that listed in the HPI Allergy/Immunologic: negative other than that listed in the HPI    Objective:  Blood pressure 130/90, pulse 80, temperature 97.7 F (36.5 C), temperature source Oral, resp. rate 16, SpO2 100 %, unknown if currently breastfeeding. There is no height or weight on file to calculate BMI.   Physical Exam:  General: Alert, interactive, in no acute distress. Eyes: No conjunctival injection bilaterally, no discharge on the right, no  discharge on the left, no Horner-Trantas dots present and allergic shiners present bilaterally. PERRL bilaterally. EOMI without pain. No photophobia.  Ears: Right TM pearly gray with normal light reflex, Left TM pearly gray with normal light reflex, Right TM intact without perforation and Left TM intact without perforation.  Nose/Throat: . Turbinates mildly edematous without discharge. Posterior oropharynx mildly erythematous without cobblestoning in the posterior oropharynx. Tonsils 2+ without exudates.  Tongue without thrush and Geographic tongue present. Lungs: Clear to auscultation without wheezing, rhonchi or rales. No increased work of breathing. CV: Normal S1/S2. No murmurs. Capillary refill <2 seconds.  Skin: Warm and dry, without lesions or rashes. Neuro:   Grossly intact. No focal deficits appreciated. Responsive to questions.    Spirometry: N/A  Rescue Medications (if needed):  Epinephrine dose: 0.3 mg Benadryl dose: 50 mg (20 mL)  Patricia [name] was given 257 mg peanut flour.  Time Patricia [name] was given the dose: 8:45 AM Time Patricia [name] was discharged: 9:58 AM  Given the up dosing today, Patricia [name] will be sent home with the following dose: 257 mg peanut flour.   Patricia Bonds, MD [facility]

## 2020-02-06 NOTE — Patient Instructions (Addendum)
1. 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  - Continue the following dose until the next visit: 257 mg peanut flour - The following physician is on call for the next week: Dr. Delorse Lek [phone]). - Feel free to reach out for any questions or concerns.   2.  Return in about 1 week (around 02/13/2020).   It was a pleasure to see you again today!  Websites that have reliable patient information: 1. American Academy of Asthma, Allergy, and Immunology: www.aaaai.org 2. Food Allergy Research and Education (FARE): foodallergy.org 3. Mothers of Asthmatics: http://www.asthmacommunitynetwork.org 4. American College of Allergy, Asthma, and Immunology: www.acaai.org    Food Oral Immunotherapy Do's and Don'ts   DO . Give the dose after having at least a snack.  Marland Kitchen Keep liquids refrigerated.  . Give escalation doses 21-27 hours apart.  . Call the office if a dose is missed. Do not give the next dose before getting instructions from our office.  . Call if there are any signs of reaction.  . Give EpiPen or Auvi-Q right away if there are signs of a severe reaction: sneezing, wheezing, cough, shortness of breath, swelling of the mouth or throat, change in voice quality, vomiting or sudden quietness. If there is a single episode of vomiting while or immediately after taking the dose and there are NO other problems, you may observe without treatment but if any other symptoms develop, administer epinephrine immediately.  . Go to the ER right away if epinephrine is given.  . Call before the next dose if there is a new illness.  . Have epinephrine available at all times!!  . Let us know by phone or email about minor problems that occur more than once.  Marland Kitchen Keep track of your doses remaining so that you don't run out unexpectedly.  . Be alert to your OIT child at brother's or sister's soccer game or other sporting event; they are likely to run around as much as  children on the field.  . Call right away for extra dosing solution if the supply is low or if an appointment must be rescheduled.   DON'T  . Don't give the dose on an empty stomach.  . Don't exercise for at least 2 hours after the OIT dose. No activity that increases the heart rate or increases body temperature.  . Don't give an escalation dose without calling the office first if it has been more than 24 hours since the last dose.  . Don't come for a dose increase if there is an active illness or asthma flare. Call to reschedule after the illness has resolved.  . Don't treat a mild reaction (a few hives, mouth itch, mild abdominal pain) that resolves within 1 hour.

## 2020-02-13 ENCOUNTER — Other Ambulatory Visit: Payer: Self-pay

## 2020-02-13 ENCOUNTER — Ambulatory Visit (INDEPENDENT_AMBULATORY_CARE_PROVIDER_SITE_OTHER): Payer: 59 | Admitting: Allergy & Immunology

## 2020-02-13 ENCOUNTER — Encounter: Payer: Self-pay | Admitting: Allergy & Immunology

## 2020-02-13 VITALS — BP 136/78 | HR 79 | Temp 97.5°F | Resp 18

## 2020-02-13 DIAGNOSIS — T7800XD Anaphylactic reaction due to unspecified food, subsequent encounter: Secondary | ICD-10-CM

## 2020-02-13 NOTE — Progress Notes (Signed)
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  - Continue the following dose until the next visit: 385 mg peanut flour - The following physician is on call for the next week: Dr. Ernst Bowler [phone]). - Feel free to reach out for any questions or concerns.   2.  Return in about 1 week (around 02/20/2020).  Subjective:   Patricia [name] is a 29 y.o. female presenting today for follow up of  Chief Complaint  Patient presents with  . Other    OIT updose    Patricia [name] has a history of the following: Patient Active Problem List   Diagnosis Date Noted  . Fatty liver 11/29/2019  . Diarrhea 09/27/2019  . Elevated LFTs 06/17/2017  . Calculus of gallbladder with acute cholecystitis without obstruction   . Nausea vomiting and diarrhea   . RUQ abdominal pain   . Transaminitis   . Biliary colic   . Gall stones, common bile duct   . Abdominal pain 06/04/2017  . 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 Chronic hypertension in pregnancy 04/13/2017  . Chronic hypertension affecting pregnancy 11/08/2016  . 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 Supervision of high risk pregnancy, antepartum 09/27/2016  . History of gestational hypertension 09/27/2016  . History of shoulder dystocia in prior pregnancy, currently pregnant 09/27/2016  . URI (upper respiratory infection) 08/01/2015  . Persistent cough 08/01/2015  . Obesity, Class III, BMI 40-49.9 (morbid obesity) ([place]) 09/23/2014  . Headache 09/23/2014  . PCO (polycystic ovaries) 03/08/2014    History obtained from: chart review and patient.  Patricia [name] Primary Care Provider is Celene Squibb, MD.     Patricia [name] is a 29 y.o. female presenting  to increase her peanut OIT dose. She completed the peanut rapid escalation in December of 2020. Her current dose is 257 mg peanut flour. Syra tolerated her dose without oral itching, stomach pain, diarrhea, vomiting, itching or hives. She remains updated on Dupixent.   She denies any symptoms of eosinophilic esophagitis, including reflux, stomach pain, difficulty swallowing, weight loss or chest pain.   Otherwise, there have been no changes to her past medical history, surgical history, family history, or social history.    Review of Systems: a 14-point review of systems is pertinent for what is mentioned in HPI.  Otherwise, all other systems were negative.  Constitutional: negative other than that listed in the HPI Eyes: negative other than that listed in the HPI Ears, nose, mouth, throat, and face: negative other than that listed in the HPI Respiratory: negative other than that listed in the HPI Cardiovascular: negative other than that listed in the HPI Gastrointestinal: negative other than that listed in the HPI Genitourinary: negative other than that listed in the HPI Integument: negative other than that listed in the HPI Hematologic: negative other than that listed in the HPI Musculoskeletal: negative other than that listed in the HPI Neurological: negative other than that listed in the HPI Allergy/Immunologic: negative other than that listed in the HPI    Objective:   Blood pressure 136/78, pulse 79, temperature (!) 97.5 F (36.4 C), temperature source Temporal, resp. rate 18, SpO2 98 %, unknown if currently breastfeeding. There is no height or weight on file to  calculate BMI.   Physical Exam:  General: Alert, interactive, in no acute distress. Pleasant female.  Eyes: No conjunctival injection bilaterally and allergic shiners present bilaterally. PERRL bilaterally. EOMI without pain. No photophobia.  Ears: Right TM pearly gray with normal light reflex, Left TM pearly gray  with normal light reflex, Right TM intact without perforation and Left TM intact without perforation.  Nose/Throat: External nose within normal limits, nasal crease present and septum midline. Turbinates edematous without discharge. Posterior oropharynx mildly erythematous without cobblestoning in the posterior oropharynx. Tonsils 2+ without exudates.  Tongue without thrush. Lungs: Clear to auscultation without wheezing, rhonchi or rales. No increased work of breathing. CV: Normal S1/S2. No murmurs. Capillary refill <2 seconds.  Skin: Warm and dry, without lesions or rashes. Neuro:   Grossly intact. No focal deficits appreciated. Responsive to questions.    Spirometry: N/A  Rescue Medications (if needed):  Epinephrine dose: 0.3 mg Benadryl dose: 50 mg (20 mL)  Patricia [name] was given 385 mg peanut flour.  Time Patricia [name] was given the dose: 8:50 AM Time Patricia [name] was discharged: 10:00 AM  Given the up dosing today, Patricia [name] will be sent home with the following dose: 385 mg peanut flour    [name], MD [facility]

## 2020-02-13 NOTE — Patient Instructions (Addendum)
1. 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  - Continue the following dose until the next visit: 385 mg peanut flour - The following physician is on call for the next week: Dr. Dellis Anes [phone]). - Feel free to reach out for any questions or concerns.   2.  Return in about 1 week (around 02/20/2020).   It was a pleasure to see you again today!  Websites that have reliable patient information: 1. American Academy of Asthma, Allergy, and Immunology: www.aaaai.org 2. Food Allergy Research and Education (FARE): foodallergy.org 3. Mothers of Asthmatics: http://www.asthmacommunitynetwork.org 4. American College of Allergy, Asthma, and Immunology: www.acaai.org    Food Oral Immunotherapy Do's and Don'ts   DO . Give the dose after having at least a snack.  Marland Kitchen Keep liquids refrigerated.  . Give escalation doses 21-27 hours apart.  . Call the office if a dose is missed. Do not give the next dose before getting instructions from our office.  . Call if there are any signs of reaction.  . Give EpiPen or Auvi-Q right away if there are signs of a severe reaction: sneezing, wheezing, cough, shortness of breath, swelling of the mouth or throat, change in voice quality, vomiting or sudden quietness. If there is a single episode of vomiting while or immediately after taking the dose and there are NO other problems, you may observe without treatment but if any other symptoms develop, administer epinephrine immediately.  . Go to the ER right away if epinephrine is given.  . Call before the next dose if there is a new illness.  . Have epinephrine available at all times!!  . Let us know by phone or email about minor problems that occur more than once.  Marland Kitchen Keep track of your doses remaining so that you don't run out unexpectedly.  . Be alert to your OIT child at brother's or sister's soccer game or other sporting event; they are likely to run around as much as  children on the field.  . Call right away for extra dosing solution if the supply is low or if an appointment must be rescheduled.   DON'T  . Don't give the dose on an empty stomach.  . Don't exercise for at least 2 hours after the OIT dose. No activity that increases the heart rate or increases body temperature.  . Don't give an escalation dose without calling the office first if it has been more than 24 hours since the last dose.  . Don't come for a dose increase if there is an active illness or asthma flare. Call to reschedule after the illness has resolved.  . Don't treat a mild reaction (a few hives, mouth itch, mild abdominal pain) that resolves within 1 hour.

## 2020-02-20 ENCOUNTER — Other Ambulatory Visit: Payer: Self-pay

## 2020-02-20 ENCOUNTER — Encounter: Payer: Self-pay | Admitting: Allergy & Immunology

## 2020-02-20 ENCOUNTER — Ambulatory Visit (INDEPENDENT_AMBULATORY_CARE_PROVIDER_SITE_OTHER): Payer: 59 | Admitting: Allergy & Immunology

## 2020-02-20 VITALS — BP 124/64 | HR 66 | Temp 97.4°F | Resp 18

## 2020-02-20 DIAGNOSIS — T7800XD Anaphylactic reaction due to unspecified food, subsequent encounter: Secondary | ICD-10-CM | POA: Diagnosis not present

## 2020-02-20 NOTE — Progress Notes (Signed)
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  - Continue the following dose until the next visit: 950 mg whole peanuts (1 peanut) - The following physician is on call for the next week: Dr. Delorse Lek [phone]). - Feel free to reach out for any questions or concerns.    2.  Return in about 1 week (around 02/27/2020).  Subjective:   Patricia [name] is a 29 y.o. female presenting today for follow up of  Chief Complaint  Patient presents with  . OIT Updose    Patricia [name] has a history of the following: Patient Active Problem List   Diagnosis Date Noted  . Fatty liver 11/29/2019  . Diarrhea 09/27/2019  . Elevated LFTs 06/17/2017  . Calculus of gallbladder with acute cholecystitis without obstruction   . Nausea vomiting and diarrhea   . RUQ abdominal pain   . Transaminitis   . Biliary colic   . Gall stones, common bile duct   . Abdominal pain 06/04/2017  . 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 Chronic hypertension in pregnancy 04/13/2017  . Chronic hypertension affecting pregnancy 11/08/2016  . 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 Supervision of high risk pregnancy, antepartum 09/27/2016  . History of gestational hypertension 09/27/2016  . History of shoulder dystocia in prior pregnancy, currently pregnant 09/27/2016  . URI (upper respiratory infection) 08/01/2015  . Persistent cough 08/01/2015  . Obesity, Class III, BMI 40-49.9 (morbid obesity) ([facility]) 09/23/2014  . Headache 09/23/2014  . PCO (polycystic ovaries) 03/08/2014    History obtained from: chart review and patient.  [name] Primary Care Provider is Benita Stabile, MD.     Patricia [name] is a 29 y.o. female presenting  to increase her peanut OIT dose. She completed the peanut rapid escalation in December of 2020. Her current dose is 385 mg peanut flour. Envi tolerated her dose without oral itching, stomach pain, diarrhea, vomiting, itching or hives.   She denies any symptoms of eosinophilic esophagitis, including reflux, stomach pain, difficulty swallowing, weight loss or chest pain.   Otherwise, there have been no changes to her past medical history, surgical history, family history, or social history.    Review of Systems: a 14-point review of systems is pertinent for what is mentioned in HPI.  Otherwise, all other systems were negative.  Constitutional: negative other than that listed in the HPI Eyes: negative other than that listed in the HPI Ears, nose, mouth, throat, and face: negative other than that listed in the HPI Respiratory: negative other than that listed in the HPI Cardiovascular: negative other than that listed in the HPI Gastrointestinal: negative other than that listed in the HPI Genitourinary: negative other than that listed in the HPI Integument: negative other than that listed in the HPI Hematologic: negative other than that listed in the HPI Musculoskeletal: negative other than that listed in the HPI Neurological: negative other than that listed in the HPI Allergy/Immunologic: negative other than that listed in the HPI    Objective:   Blood pressure 124/64, pulse 66, temperature (!) 97.4 F (36.3 C), resp. rate 18, SpO2 100 %, unknown if currently breastfeeding. There is no height or weight on file to calculate BMI.   Physical Exam:  General: Alert,  interactive, in no acute distress. Eyes: No conjunctival injection bilaterally, no discharge on the right, no discharge on the left and no Horner-Trantas dots present. PERRL bilaterally. EOMI without pain. No photophobia.  Ears: Right TM pearly gray with normal light reflex, Left TM pearly gray with normal light reflex, Right TM  intact without perforation and Left TM intact without perforation.  Nose/Throat: External nose within normal limits and septum midline. Turbinates edematous and pale without discharge. Posterior oropharynx mildly erythematous without cobblestoning in the posterior oropharynx. Tonsils 2+ without exudates.  Tongue without thrush. Lungs: Clear to auscultation without wheezing, rhonchi or rales. No increased work of breathing. CV: Normal S1/S2. No murmurs. Capillary refill <2 seconds.  Skin: Warm and dry, without lesions or rashes. Neuro:   Grossly intact. No focal deficits appreciated. Responsive to questions.    Spirometry: N/A  Rescue Medications (if needed):  Epinephrine dose: 0.3 mg Benadryl dose: 50 mg (20 mL)  Sally-Ann was given 950 mg whole peanuts (1 peanut).  Time Kalana was given the dose: 8:39 AM Time Demiya was discharged: 9:48 AM  Given the up dosing today, Lisanne will be sent home with the following dose: 950 mg whole peanuts (1 peanut).   [name], MD [facility] [place]

## 2020-02-20 NOTE — Patient Instructions (Addendum)
1. 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  - Continue the following dose until the next visit: 950 mg whole peanuts (1 peanut) - The following physician is on call for the next week: Dr. Delorse Lek [phone]). - Feel free to reach out for any questions or concerns.    2.  Return in about 1 week (around 02/27/2020).   It was a pleasure to see you again today!  Websites that have reliable patient information: 1. American Academy of Asthma, Allergy, and Immunology: www.aaaai.org 2. Food Allergy Research and Education (FARE): foodallergy.org 3. Mothers of Asthmatics: http://www.asthmacommunitynetwork.org 4. American College of Allergy, Asthma, and Immunology: www.acaai.org       Food Oral Immunotherapy Do's and Don'ts   DO . Give the dose after having at least a snack.  Marland Kitchen Keep liquids refrigerated.  . Give escalation doses 21-27 hours apart.  . Call the office if a dose is missed. Do not give the next dose before getting instructions from our office.  . Call if there are any signs of reaction.  . Give EpiPen or Auvi-Q right away if there are signs of a severe reaction: sneezing, wheezing, cough, shortness of breath, swelling of the mouth or throat, change in voice quality, vomiting or sudden quietness. If there is a single episode of vomiting while or immediately after taking the dose and there are NO other problems, you may observe without treatment but if any other symptoms develop, administer epinephrine immediately.  . Go to the ER right away if epinephrine is given.  . Call before the next dose if there is a new illness.  . Have epinephrine available at all times!!  . Let us know by phone or email about minor problems that occur more than once.  Marland Kitchen Keep track of your doses remaining so that you don't run out unexpectedly.  . Be alert to your OIT child at brother's or sister's soccer game or other sporting event; they are likely to run  around as much as children on the field.  . Call right away for extra dosing solution if the supply is low or if an appointment must be rescheduled.   DON'T  . Don't give the dose on an empty stomach.  . Don't exercise for at least 2 hours after the OIT dose. No activity that increases the heart rate or increases body temperature.  . Don't give an escalation dose without calling the office first if it has been more than 24 hours since the last dose.  . Don't come for a dose increase if there is an active illness or asthma flare. Call to reschedule after the illness has resolved.  . Don't treat a mild reaction (a few hives, mouth itch, mild abdominal pain) that resolves within 1 hour.

## 2020-02-25 ENCOUNTER — Telehealth: Payer: Self-pay

## 2020-02-25 NOTE — Telephone Encounter (Signed)
Spoke to patient after missing each others calls. Pt. Has a scale that measures in grams and pt. Will bring in on Wednesday when she up doses. Pt. Will order peanuts from the web site that I had given her [employer] that doesn't cross contaminate with tree nuts for maintenance dosing. Pt. Will order those today.

## 2020-02-27 ENCOUNTER — Encounter: Payer: Self-pay | Admitting: Allergy & Immunology

## 2020-02-27 ENCOUNTER — Ambulatory Visit (INDEPENDENT_AMBULATORY_CARE_PROVIDER_SITE_OTHER): Payer: 59 | Admitting: Allergy & Immunology

## 2020-02-27 ENCOUNTER — Other Ambulatory Visit: Payer: Self-pay

## 2020-02-27 VITALS — BP 124/68 | HR 69 | Temp 98.0°F | Resp 18 | Ht 64.0 in

## 2020-02-27 DIAGNOSIS — T7800XD Anaphylactic reaction due to unspecified food, subsequent encounter: Secondary | ICD-10-CM

## 2020-02-27 NOTE — Patient Instructions (Addendum)
1. 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  - Continue the following dose until the next visit: 1900 mg whole peanuts (2 peanuts) - The following physician is on call for the next week: Dr. Delorse Lek [phone]). - Feel free to reach out for any questions or concerns.    2.  Return in about 1 week (around 03/05/2020).   It was a pleasure to see you again today!  Websites that have reliable patient information: 1. American Academy of Asthma, Allergy, and Immunology: www.aaaai.org 2. Food Allergy Research and Education (FARE): foodallergy.org 3. Mothers of Asthmatics: http://www.asthmacommunitynetwork.org 4. American College of Allergy, Asthma, and Immunology: www.acaai.org       Food Oral Immunotherapy Do's and Don'ts   DO . Give the dose after having at least a snack.  Marland Kitchen Keep liquids refrigerated.  . Give escalation doses 21-27 hours apart.  . Call the office if a dose is missed. Do not give the next dose before getting instructions from our office.  . Call if there are any signs of reaction.  . Give EpiPen or Auvi-Q right away if there are signs of a severe reaction: sneezing, wheezing, cough, shortness of breath, swelling of the mouth or throat, change in voice quality, vomiting or sudden quietness. If there is a single episode of vomiting while or immediately after taking the dose and there are NO other problems, you may observe without treatment but if any other symptoms develop, administer epinephrine immediately.  . Go to the ER right away if epinephrine is given.  . Call before the next dose if there is a new illness.  . Have epinephrine available at all times!!  . Let us know by phone or email about minor problems that occur more than once.  Marland Kitchen Keep track of your doses remaining so that you don't run out unexpectedly.  . Be alert to your OIT child at brother's or sister's soccer game or other sporting event; they are likely to run  around as much as children on the field.  . Call right away for extra dosing solution if the supply is low or if an appointment must be rescheduled.   DON'T  . Don't give the dose on an empty stomach.  . Don't exercise for at least 2 hours after the OIT dose. No activity that increases the heart rate or increases body temperature.  . Don't give an escalation dose without calling the office first if it has been more than 24 hours since the last dose.  . Don't come for a dose increase if there is an active illness or asthma flare. Call to reschedule after the illness has resolved.  . Don't treat a mild reaction (a few hives, mouth itch, mild abdominal pain) that resolves within 1 hour.

## 2020-02-27 NOTE — Progress Notes (Signed)
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  - Continue the following dose until the next visit: 1900 mg whole peanuts (2 peanuts) - The following physician is on call for the next week: Dr. Delorse Lek [phone]). - Feel free to reach out for any questions or concerns.    2.  Return in about 1 week (around 03/05/2020).   Subjective:   Patricia [name] is a 29 y.o. female presenting today for follow up of  Chief Complaint  Patient presents with  . Allergic Reaction    Peanuts    Patricia [name] has a history of the following: Patient Active Problem List   Diagnosis Date Noted  . Fatty liver 11/29/2019  . Diarrhea 09/27/2019  . Elevated LFTs 06/17/2017  . Calculus of gallbladder with acute cholecystitis without obstruction   . Nausea vomiting and diarrhea   . RUQ abdominal pain   . Transaminitis   . Biliary colic   . Gall stones, common bile duct   . Abdominal pain 06/04/2017  . 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 Chronic hypertension in pregnancy 04/13/2017  . Chronic hypertension affecting pregnancy 11/08/2016  . 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 Supervision of high risk pregnancy, antepartum 09/27/2016  . History of gestational hypertension 09/27/2016  . History of shoulder dystocia in prior pregnancy, currently pregnant 09/27/2016  . URI (upper respiratory infection) 08/01/2015  . Persistent cough 08/01/2015  . Obesity, Class III, BMI 40-49.9 (morbid obesity) (HCC) 09/23/2014  . Headache 09/23/2014  . PCO (polycystic ovaries) 03/08/2014    History obtained from: chart review and patient.  Patricia [name] Primary Care Provider is Benita Stabile, MD.     Patricia [name] is a 29 y.o. female presenting to increase her peanut OIT dose. She completed the peanut rapid escalation in December of 2020. Her current dose is 950 mg whole peanuts (1 peanut). [name] tolerated her dose without oral itching, stomach pain, diarrhea, vomiting, itching or hives. She does report that she had some mouth itching for around 3 minutes or so.   She denies any symptoms of eosinophilic esophagitis, including reflux, stomach pain, difficulty swallowing, weight loss or chest pain.   Otherwise, there have been no changes to her past medical history, surgical history, family history, or social history.    Review of Systems: a 14-point review of systems is pertinent for what is mentioned in HPI.  Otherwise, all other systems were negative.  Constitutional: negative other than that listed in the HPI Eyes: negative other than that listed in the HPI Ears, nose, mouth, throat, and face: negative other than that listed in the HPI Respiratory: negative other than that listed in the HPI Cardiovascular: negative other than that listed in the HPI Gastrointestinal: negative other than that listed in the HPI Genitourinary: negative other than that listed in the HPI Integument: negative other than that listed in the HPI Hematologic: negative other than that listed in the HPI Musculoskeletal: negative other than that listed in the HPI Neurological: negative other than that listed in the HPI Allergy/Immunologic: negative other than that listed in the HPI    Objective:   Blood pressure 124/68, pulse 69, temperature 98 F (36.7 C), temperature source Temporal, resp. rate  18, height 5\' 4"  (1.626 m), SpO2 100 %, unknown if currently breastfeeding. Body mass index is 50.12 kg/m.   Physical Exam:  General: Alert, interactive, in no acute distress. Pleasant and interactive. Eyes: No conjunctival injection bilaterally, no discharge on the right, no discharge on the left and no Horner-Trantas  dots present. PERRL bilaterally. EOMI without pain. No photophobia.  Ears: Right TM pearly gray with normal light reflex, Left TM pearly gray with normal light reflex, Right TM intact without perforation and Left TM intact without perforation.  Nose/Throat: External nose within normal limits and septum midline. Turbinates minimally edematous without discharge. Posterior oropharynx mildly erythematous without cobblestoning in the posterior oropharynx. Tonsils 2+ without exudates.  Tongue without thrush. Lungs: Clear to auscultation without wheezing, rhonchi or rales. No increased work of breathing. CV: Normal S1/S2. No murmurs. Capillary refill <2 seconds.  Skin: Warm and dry, without lesions or rashes. Neuro:   Grossly intact. No focal deficits appreciated. Responsive to questions.    Spirometry: N/A  Rescue Medications (if needed):  Epinephrine dose: 0.3 mg Benadryl dose: 50 mg (20 mL)  Patricia [name] was given 1900 mg whole peanuts (2 peanuts).  Time Patricia [name] was given the dose: 8:37 AM Time Patricia [name] was discharged: 9:48 AM  Given the up dosing today, Patricia [name] will be sent home with the following dose: 1900 mg whole peanuts (2 peanuts).  [name], MD [facility] [place] [facility] [place]

## 2020-03-04 NOTE — Progress Notes (Signed)
[name]:  Date of Service/Encounter:  03/05/20   Assessment:   Anaphylaxis to peanut (on OIT)  Plan/Recommendations:    Patient Instructions  1. 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  - Continue the following dose until the next visit: 3 peanuts (2850 mg or 2.85 g) - The following physician is on call for the next week: Dr. Ernst Bowler [phone]). - Feel free to reach out for any questions or concerns.   2.  Follow up in 1 week  It was a pleasure to see you again today!  Websites that have reliable patient information: 1. American Academy of Asthma, Allergy, and Immunology: www.aaaai.org 2. Food Allergy Research and Education (FARE): foodallergy.org 3. Mothers of Asthmatics: http://www.asthmacommunitynetwork.org 4. American College of Allergy, Asthma, and Immunology: www.acaai.org    Food Oral Immunotherapy Do's and Don'ts   DO . Give the dose after having at least a snack.  Marland Kitchen Keep liquids refrigerated.  . Give escalation doses 21-27 hours apart.  . Call the office if a dose is missed. Do not give the next dose before getting instructions from our office.  . Call if there are any signs of reaction.  . Give EpiPen or Auvi-Q right away if there are signs of a severe reaction: sneezing, wheezing, cough, shortness of breath, swelling of the mouth or throat, change in voice quality, vomiting or sudden quietness. If there is a single episode of vomiting while or immediately after taking the dose and there are NO other problems, you may observe without treatment but if any other symptoms develop, administer epinephrine immediately.  . Go to the ER right away if epinephrine is given.  . Call before the next dose if there is a new illness.  . Have epinephrine available at all times!!  . Let us know by phone or email about minor problems that occur more than once.  Marland Kitchen Keep track of your doses remaining so  that you don't run out unexpectedly.  . Be alert to your OIT child at brother's or sister's soccer game or other sporting event; they are likely to run around as much as children on the field.  . Call right away for extra dosing solution if the supply is low or if an appointment must be rescheduled.   DON'T  . Don't give the dose on an empty stomach.  . Don't exercise for at least 2 hours after the OIT dose. No activity that increases the heart rate or increases body temperature.  . Don't give an escalation dose without calling the office first if it has been more than 24 hours since the last dose.  . Don't come for a dose increase if there is an active illness or asthma flare. Call to reschedule after the illness has resolved.  . Don't treat a mild reaction (a few hives, mouth itch, mild abdominal pain) that resolves within 1 hour.        Subjective:   Patricia [name] is a 29 y.o. female presenting today for follow up of  Chief Complaint  Patient presents with  . OIT Updose    Patricia [name] has a history of the following: Patient Active Problem List   Diagnosis Date Noted  . 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 Fatty liver 11/29/2019  . Diarrhea 09/27/2019  . Elevated LFTs 06/17/2017  . Calculus of gallbladder with acute cholecystitis without obstruction   . Nausea vomiting  and diarrhea   . RUQ abdominal pain   . Transaminitis   . Biliary colic   . Gall stones, common bile duct   . Abdominal pain 06/04/2017  . 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 Chronic hypertension in pregnancy 04/13/2017  . Chronic hypertension affecting pregnancy 11/08/2016  . 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 Supervision of high risk pregnancy, antepartum 09/27/2016  . History of gestational hypertension 09/27/2016  . History of shoulder dystocia in prior pregnancy, currently pregnant 09/27/2016  . URI  (upper respiratory infection) 08/01/2015  . Persistent cough 08/01/2015  . Obesity, Class III, BMI 40-49.9 (morbid obesity) (HCC) 09/23/2014  . Headache 09/23/2014  . PCO (polycystic ovaries) 03/08/2014    History obtained from: chart review and patient interview.  Patricia [name] Primary Care Provider is Benita Stabile, MD.     Patricia [name] is a 29 y.o. female presenting to increase her peanut OIT dose. She completed the peanut rapid escalation in December of 2020. Her current dose is   1900 mg whole peanuts (2 peanuts). Patricia [name] tolerated her dose without oral itching, stomach pain, diarrhea, vomiting, itching or hives.   She denies any symptoms of eosinophilic esophagitis, including reflux, stomach pain, difficulty swallowing, weight loss or chest pain.    Otherwise, there have been no changes to her past medical history, surgical history, family history, or social history.  Review of Systems: a review of systems is pertinent for what is mentioned in HPI.  Otherwise, all other systems were negative.  Constitutional: negative other than that listed in the HPI Respiratory: negative other than that listed in the HPI Cardiovascular: negative other than that listed in the HPI Gastrointestinal: negative other than that listed in the HPI Integument: negative other than that listed in the HPI Allergy/Immunologic: negative other than that listed in the HPI   Objective:   Blood pressure 130/88, pulse 75, temperature (!) 97.4 F (36.3 C), temperature source Temporal, SpO2 99 %, unknown if currently breastfeeding. There is no height or weight on file to calculate BMI.   Physical Exam:  General: Alert, interactive, in no acute distress. Eyes: No conjunctival injection present on the right and No conjunctival injection present on the left. PERRL bilaterally. EOMI without pain. No photophobia.  Ears: Right TM pearly gray with normal light reflex and Left TM pearly gray with normal light  reflex.  Nose/Throat: External nose within normal limits and septum midline. Turbinates minimally edematous without discharge. Posterior oropharynx unremarkable without cobblestoning in the posterior oropharynx. Tonsils unremarklable without exudates.  Tongue without thrush. Lungs: Clear to auscultation without wheezing, rhonchi or rales. No increased work of breathing. CV: Normal S1/S2. No murmurs. Capillary refill <2 seconds.  Skin: Warm and dry, without lesions or rashes. Neuro:   Grossly intact. No focal deficits appreciated. Responsive to questions.    Spirometry: N/A  Rescue Medications (if needed):  Epinephrine dose: 0.3 mg Benadryl dose: 50 mg (20 mL)  Patricia [name] was given   2850 mg whole peanuts (3 peanuts).  Time Patricia [name] was given the dose: 8:45 AM Time Patricia [name] was discharged: 9:45 PM  Given the up dosing today, Patricia [name] will be sent home with the following dose:   2850 mg whole peanuts (3 peanuts).  Call the clinic if this treatment plan is not working well for you  Thank you for the opportunity to care for this patient.  Please do not hesitate to contact me with questions.  Thurston Hole  Vola Beneke, FNP [facility]

## 2020-03-05 ENCOUNTER — Other Ambulatory Visit: Payer: Self-pay

## 2020-03-05 ENCOUNTER — Ambulatory Visit (INDEPENDENT_AMBULATORY_CARE_PROVIDER_SITE_OTHER): Payer: 59 | Admitting: Family Medicine

## 2020-03-05 ENCOUNTER — Encounter: Payer: Self-pay | Admitting: Family Medicine

## 2020-03-05 VITALS — BP 130/88 | HR 75 | Temp 97.4°F

## 2020-03-05 DIAGNOSIS — T7800XD Anaphylactic reaction due to unspecified food, subsequent encounter: Secondary | ICD-10-CM

## 2020-03-05 DIAGNOSIS — T7800XA Anaphylactic reaction due to unspecified food, initial encounter: Secondary | ICD-10-CM | POA: Insufficient documentation

## 2020-03-05 NOTE — Patient Instructions (Addendum)
1. 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  - Continue the following dose until the next visit: 3 peanuts (2850 mg or 2.85 g) - The following physician is on call for the next week: Dr. Dellis Anes [phone]). - Feel free to reach out for any questions or concerns.   2.  Follow up in 1 week  It was a pleasure to see you again today!  Websites that have reliable patient information: 1. American Academy of Asthma, Allergy, and Immunology: www.aaaai.org 2. Food Allergy Research and Education (FARE): foodallergy.org 3. Mothers of Asthmatics: http://www.asthmacommunitynetwork.org 4. American College of Allergy, Asthma, and Immunology: www.acaai.org    Food Oral Immunotherapy Do's and Don'ts   DO . Give the dose after having at least a snack.  Marland Kitchen Keep liquids refrigerated.  . Give escalation doses 21-27 hours apart.  . Call the office if a dose is missed. Do not give the next dose before getting instructions from our office.  . Call if there are any signs of reaction.  . Give EpiPen or Auvi-Q right away if there are signs of a severe reaction: sneezing, wheezing, cough, shortness of breath, swelling of the mouth or throat, change in voice quality, vomiting or sudden quietness. If there is a single episode of vomiting while or immediately after taking the dose and there are NO other problems, you may observe without treatment but if any other symptoms develop, administer epinephrine immediately.  . Go to the ER right away if epinephrine is given.  . Call before the next dose if there is a new illness.  . Have epinephrine available at all times!!  . Let us know by phone or email about minor problems that occur more than once.  Marland Kitchen Keep track of your doses remaining so that you don't run out unexpectedly.  . Be alert to your OIT child at brother's or sister's soccer game or other sporting event; they are likely to run around as much as children on the  field.  . Call right away for extra dosing solution if the supply is low or if an appointment must be rescheduled.   DON'T  . Don't give the dose on an empty stomach.  . Don't exercise for at least 2 hours after the OIT dose. No activity that increases the heart rate or increases body temperature.  . Don't give an escalation dose without calling the office first if it has been more than 24 hours since the last dose.  . Don't come for a dose increase if there is an active illness or asthma flare. Call to reschedule after the illness has resolved.  . Don't treat a mild reaction (a few hives, mouth itch, mild abdominal pain) that resolves within 1 hour.

## 2020-03-12 ENCOUNTER — Ambulatory Visit (INDEPENDENT_AMBULATORY_CARE_PROVIDER_SITE_OTHER): Payer: 59 | Admitting: Allergy & Immunology

## 2020-03-12 ENCOUNTER — Encounter: Payer: Self-pay | Admitting: Allergy & Immunology

## 2020-03-12 ENCOUNTER — Other Ambulatory Visit: Payer: Self-pay

## 2020-03-12 VITALS — BP 112/64 | HR 78 | Temp 97.9°F | Resp 18

## 2020-03-12 DIAGNOSIS — T7800XD Anaphylactic reaction due to unspecified food, subsequent encounter: Secondary | ICD-10-CM

## 2020-03-12 NOTE — Progress Notes (Signed)
[name]:  Date of Service/Encounter:  03/12/20   Assessment:   Anaphylaxis to peanut (on OIT)  Plan/Recommendations:   1. 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  - Continue the following dose until the next visit: 3800 mg whole peanuts (4 peanuts) - The following physician is on call for the next week: Dr. Ernst Bowler [phone]). - Feel free to reach out for any questions or concerns.   2.  Return in about 1 week (around 03/19/2020).  Subjective:   Patricia [name] is a 29 y.o. female presenting today for follow up of  Chief Complaint  Patient presents with  . Allergic Reaction    Patricia [name] has a history of the following: Patient Active Problem List   Diagnosis Date Noted  . 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 Fatty liver 11/29/2019  . Diarrhea 09/27/2019  . Elevated LFTs 06/17/2017  . Calculus of gallbladder with acute cholecystitis without obstruction   . Nausea vomiting and diarrhea   . RUQ abdominal pain   . Transaminitis   . Biliary colic   . Gall stones, common bile duct   . Abdominal pain 06/04/2017  . 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 Chronic hypertension in pregnancy 04/13/2017  . Chronic hypertension affecting pregnancy 11/08/2016  . 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 Supervision of high risk pregnancy, antepartum 09/27/2016  . History of gestational hypertension 09/27/2016  . History of shoulder dystocia in prior pregnancy, currently pregnant 09/27/2016  . URI (upper respiratory infection) 08/01/2015  . Persistent cough 08/01/2015  . Obesity, Class III, BMI 40-49.9 (morbid obesity) ([place]) 09/23/2014  . Headache 09/23/2014  . PCO (polycystic ovaries) 03/08/2014    History obtained from: chart review and patient.  Patricia [name] Primary Care Provider is Celene Squibb, MD.      Patricia [name] is a 29 y.o. female presenting to increase her peanut OIT dose. She completed the peanut rapid escalation in December of 2020. Her current dose is 2850 mg whole peanuts (3 peanuts). Patricia [name] tolerated her dose without oral itching, stomach pain, diarrhea, vomiting, itching or hives.   She denies any symptoms of eosinophilic esophagitis, including reflux, stomach pain, difficulty swallowing, weight loss or chest pain.   She is doing fairly well.  She did have an issue in one of her classes where she did not turn in a paper because she thought her grade could tolerate not turning in the paper.  Unfortunately, the teacher was not calculating the grades correctly and she now has a see because of this 0.  She is slightly annoyed by this.  Otherwise, there have been no changes to her past medical history, surgical history, family history, or social history.    Review of Systems: a 14-point review of systems is pertinent for what is mentioned in HPI.  Otherwise, all other systems were negative.  Constitutional: negative other than that listed in the HPI Eyes: negative other than that listed in the HPI Ears, nose, mouth, throat, and face: negative other than that listed in the HPI Respiratory: negative other than that listed in the HPI Cardiovascular: negative other than that listed in the HPI Gastrointestinal: negative other than that listed in the HPI Genitourinary: negative other than that listed in the HPI Integument: negative other than that listed in the HPI Hematologic: negative other than that listed in the HPI  Musculoskeletal: negative other than that listed in the HPI Neurological: negative other than that listed in the HPI Allergy/Immunologic: negative other than that listed in the HPI    Objective:   Blood pressure 112/64, pulse 78, temperature 97.9 F (36.6 C), temperature source Temporal, resp. rate 18, SpO2 98 %, unknown if currently breastfeeding. There is no  height or weight on file to calculate BMI.   Physical Exam:  General: Alert, interactive, in no acute distress.  Eyes: No conjunctival injection bilaterally, no discharge on the right, no discharge on the left, no Horner-Trantas dots present and allergic shiners present bilaterally. PERRL bilaterally. EOMI without pain. No photophobia.  Ears: Right TM pearly gray with normal light reflex, Left TM pearly gray with normal light reflex, Right TM intact without perforation and Left TM intact without perforation.  Nose/Throat: External nose within normal limits and septum midline. Turbinates edematous with clear discharge. Posterior oropharynx mildly erythematous without cobblestoning in the posterior oropharynx. Tonsils 2+ without exudates.  Tongue without thrush. Lungs: Clear to auscultation without wheezing, rhonchi or rales. No increased work of breathing. CV: Normal S1/S2. No murmurs. Capillary refill <2 seconds.  Skin: Warm and dry, without lesions or rashes. Neuro:   Grossly intact. No focal deficits appreciated. Responsive to questions.    Spirometry: N/A  Rescue Medications (if needed):  Epinephrine dose: 0.3 mg Benadryl dose: 50 mg (20 mL)  Patricia [name] was given 3800 mg whole peanuts (4 peanuts).  Time Patricia [name] was given the dose: 8:55 AM Time Patricia [name] was discharged: 10:05 AM  Given the up dosing today, Patricia [name] will be sent home with the following dose: 3800 mg whole peanuts (4 peanuts).   Patricia Bonds, MD [facility]

## 2020-03-12 NOTE — Patient Instructions (Signed)
1. 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  - Continue the following dose until the next visit: 3800 mg whole peanuts (4 peanuts) - The following physician is on call for the next week: Dr. Dellis Anes [phone]). - Feel free to reach out for any questions or concerns.   2.  Return in about 1 week (around 03/19/2020).   It was a pleasure to see you again today!  Websites that have reliable patient information: 1. American Academy of Asthma, Allergy, and Immunology: www.aaaai.org 2. Food Allergy Research and Education (FARE): foodallergy.org 3. Mothers of Asthmatics: http://www.asthmacommunitynetwork.org 4. American College of Allergy, Asthma, and Immunology: www.acaai.org    Food Oral Immunotherapy Do's and Don'ts   DO . Give the dose after having at least a snack.  Marland Kitchen Keep liquids refrigerated.  . Give escalation doses 21-27 hours apart.  . Call the office if a dose is missed. Do not give the next dose before getting instructions from our office.  . Call if there are any signs of reaction.  . Give EpiPen or Auvi-Q right away if there are signs of a severe reaction: sneezing, wheezing, cough, shortness of breath, swelling of the mouth or throat, change in voice quality, vomiting or sudden quietness. If there is a single episode of vomiting while or immediately after taking the dose and there are NO other problems, you may observe without treatment but if any other symptoms develop, administer epinephrine immediately.  . Go to the ER right away if epinephrine is given.  . Call before the next dose if there is a new illness.  . Have epinephrine available at all times!!  . Let us know by phone or email about minor problems that occur more than once.  Marland Kitchen Keep track of your doses remaining so that you don't run out unexpectedly.  . Be alert to your OIT child at brother's or sister's soccer game or other sporting event; they are likely to run around  as much as children on the field.  . Call right away for extra dosing solution if the supply is low or if an appointment must be rescheduled.   DON'T  . Don't give the dose on an empty stomach.  . Don't exercise for at least 2 hours after the OIT dose. No activity that increases the heart rate or increases body temperature.  . Don't give an escalation dose without calling the office first if it has been more than 24 hours since the last dose.  . Don't come for a dose increase if there is an active illness or asthma flare. Call to reschedule after the illness has resolved.  . Don't treat a mild reaction (a few hives, mouth itch, mild abdominal pain) that resolves within 1 hour.

## 2020-03-19 ENCOUNTER — Other Ambulatory Visit: Payer: Self-pay

## 2020-03-19 ENCOUNTER — Encounter: Payer: Self-pay | Admitting: Allergy & Immunology

## 2020-03-19 ENCOUNTER — Ambulatory Visit (INDEPENDENT_AMBULATORY_CARE_PROVIDER_SITE_OTHER): Payer: 59 | Admitting: Allergy & Immunology

## 2020-03-19 VITALS — BP 126/90 | HR 68 | Temp 98.2°F | Resp 20

## 2020-03-19 DIAGNOSIS — T7800XD Anaphylactic reaction due to unspecified food, subsequent encounter: Secondary | ICD-10-CM

## 2020-03-19 NOTE — Progress Notes (Signed)
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  - Continue the following dose until the next visit: 5700 mg whole peanuts (6 peanuts) - The following physician is on call for the next week: Dr. Delorse Lek [phone]). - Feel free to reach out for any questions or concerns.   2.  Return in about 1 week (around 03/26/2020).  Subjective:   Patricia [name] is a 29 y.o. female presenting today for follow up of  Chief Complaint  Patient presents with  . Food/Drug Challenge    [name] has a history of the following: Patient Active Problem List   Diagnosis Date Noted  . 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 Fatty liver 11/29/2019  . Diarrhea 09/27/2019  . Elevated LFTs 06/17/2017  . Calculus of gallbladder with acute cholecystitis without obstruction   . Nausea vomiting and diarrhea   . RUQ abdominal pain   . Transaminitis   . Biliary colic   . Gall stones, common bile duct   . Abdominal pain 06/04/2017  . 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 Chronic hypertension in pregnancy 04/13/2017  . Chronic hypertension affecting pregnancy 11/08/2016  . 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 Supervision of high risk pregnancy, antepartum 09/27/2016  . History of gestational hypertension 09/27/2016  . History of shoulder dystocia in prior pregnancy, currently pregnant 09/27/2016  . URI (upper respiratory infection) 08/01/2015  . Persistent cough 08/01/2015  . Obesity, Class III, BMI 40-49.9 (morbid obesity) (HCC) 09/23/2014  . Headache 09/23/2014  . PCO (polycystic ovaries) 03/08/2014    History obtained from: chart review and patient.  [name] Primary Care Provider is Benita Stabile, MD.       Patricia [name] is a 29 y.o. female presenting to increase her peanut OIT dose. She completed the peanut rapid escalation in December of 2020. Her current dose is 3800 mg whole peanuts (4 peanuts). Tawana tolerated her dose without oral itching, stomach pain, diarrhea, vomiting, itching or hives.   She denies any symptoms of eosinophilic esophagitis, including reflux, stomach pain, difficulty swallowing, weight loss or chest pain.    Otherwise, there have been no changes to her past medical history, surgical history, family history, or social history.    Review of Systems: a 14-point review of systems is pertinent for what is mentioned in HPI.  Otherwise, all other systems were negative.  Constitutional: negative other than that listed in the HPI Eyes: negative other than that listed in the HPI Ears, nose, mouth, throat, and face: negative other than that listed in the HPI Respiratory: negative other than that listed in the HPI Cardiovascular: negative other than that listed in the HPI Gastrointestinal: negative other than that listed in the HPI Genitourinary: negative other than that listed in the HPI Integument: negative other than that listed in the HPI Hematologic: negative other than that listed in the HPI Musculoskeletal: negative other than that listed in the HPI Neurological: negative other than that listed in the HPI Allergy/Immunologic: negative other than that listed in the HPI    Objective:   Blood pressure 126/90, pulse 68, temperature 98.2 F (36.8 C), temperature source Temporal, resp. rate 20, SpO2 95 %, unknown if currently breastfeeding. There is no height or weight  on file to calculate BMI.   Physical Exam:  General: Alert, interactive, in no acute distress. Pleasant female. Talkative.  Eyes: No conjunctival injection bilaterally, no discharge on the right, no discharge on the left and no Horner-Trantas dots present. PERRL bilaterally. EOMI without pain. No  photophobia.  Ears: Right TM pearly gray with normal light reflex, Left TM pearly gray with normal light reflex, Right TM intact without perforation and Left TM intact without perforation.  Nose/Throat: External nose within normal limits and septum midline. Turbinates edematous and pale without discharge. Posterior oropharynx mildly erythematous without cobblestoning in the posterior oropharynx. Tonsils 2+ without exudates.  Tongue without thrush. Lungs: Clear to auscultation without wheezing, rhonchi or rales. No increased work of breathing. CV: Normal S1/S2. No murmurs. Capillary refill <2 seconds.  Skin: Warm and dry, without lesions or rashes. Neuro:   Grossly intact. No focal deficits appreciated. Responsive to questions.    Spirometry: N/A  Rescue Medications (if needed):  Epinephrine dose: 0.3 mg Benadryl dose: 50 mg (20 mL)  Jensine was given 5700 mg whole peanuts (6 peanuts).  Time Landri was given the dose: 8:52 AM Time Daleiza was discharged: 09:59 AM  Given the up dosing today, Esme will be sent home with the following dose: 5700 mg whole peanuts (6 peanuts).  [name], MD [facility] [place] [facility] [place]

## 2020-03-19 NOTE — Patient Instructions (Addendum)
1. 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  - Continue the following dose until the next visit: 5700 mg whole peanuts (6 peanuts) - The following physician is on call for the next week: Dr. Delorse Lek [phone] - Feel free to reach out for any questions or concerns.   2.  Return in about 1 week (around 03/26/2020).   It was a pleasure to see you again today!  Websites that have reliable patient information: 1. American Academy of Asthma, Allergy, and Immunology: www.aaaai.org 2. Food Allergy Research and Education (FARE): foodallergy.org 3. Mothers of Asthmatics: http://www.asthmacommunitynetwork.org 4. American College of Allergy, Asthma, and Immunology: www.acaai.org    Food Oral Immunotherapy Do's and Don'ts   DO . Give the dose after having at least a snack.  Marland Kitchen Keep liquids refrigerated.  . Give escalation doses 21-27 hours apart.  . Call the office if a dose is missed. Do not give the next dose before getting instructions from our office.  . Call if there are any signs of reaction.  . Give EpiPen or Auvi-Q right away if there are signs of a severe reaction: sneezing, wheezing, cough, shortness of breath, swelling of the mouth or throat, change in voice quality, vomiting or sudden quietness. If there is a single episode of vomiting while or immediately after taking the dose and there are NO other problems, you may observe without treatment but if any other symptoms develop, administer epinephrine immediately.  . Go to the ER right away if epinephrine is given.  . Call before the next dose if there is a new illness.  . Have epinephrine available at all times!!  . Let us know by phone or email about minor problems that occur more than once.  Marland Kitchen Keep track of your doses remaining so that you don't run out unexpectedly.  . Be alert to your OIT child at brother's or sister's soccer game or other sporting event; they are likely to run around as  much as children on the field.  . Call right away for extra dosing solution if the supply is low or if an appointment must be rescheduled.   DON'T  . Don't give the dose on an empty stomach.  . Don't exercise for at least 2 hours after the OIT dose. No activity that increases the heart rate or increases body temperature.  . Don't give an escalation dose without calling the office first if it has been more than 24 hours since the last dose.  . Don't come for a dose increase if there is an active illness or asthma flare. Call to reschedule after the illness has resolved.  . Don't treat a mild reaction (a few hives, mouth itch, mild abdominal pain) that resolves within 1 hour.

## 2020-03-26 ENCOUNTER — Other Ambulatory Visit: Payer: Self-pay

## 2020-03-26 ENCOUNTER — Ambulatory Visit (INDEPENDENT_AMBULATORY_CARE_PROVIDER_SITE_OTHER): Payer: 59 | Admitting: Allergy & Immunology

## 2020-03-26 ENCOUNTER — Encounter: Payer: Self-pay | Admitting: Allergy & Immunology

## 2020-03-26 VITALS — BP 138/88 | HR 88 | Temp 98.0°F | Resp 18

## 2020-03-26 DIAGNOSIS — T7800XD Anaphylactic reaction due to unspecified food, subsequent encounter: Secondary | ICD-10-CM

## 2020-03-26 NOTE — Patient Instructions (Addendum)
1. 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  - Continue the following dose until the next visit: 7600 mg whole peanuts (8 peanuts) - The following physician is on call for the next week: Dr. Dellis Anes [phone]). - Feel free to reach out for any questions or concerns.   2.  Return in about 1 week (around 04/02/2020).   It was a pleasure to see you again today!  Websites that have reliable patient information: 1. American Academy of Asthma, Allergy, and Immunology: www.aaaai.org 2. Food Allergy Research and Education (FARE): foodallergy.org 3. Mothers of Asthmatics: http://www.asthmacommunitynetwork.org 4. American College of Allergy, Asthma, and Immunology: www.acaai.org    Food Oral Immunotherapy Do's and Don'ts   DO . Give the dose after having at least a snack.  Marland Kitchen Keep liquids refrigerated.  . Give escalation doses 21-27 hours apart.  . Call the office if a dose is missed. Do not give the next dose before getting instructions from our office.  . Call if there are any signs of reaction.  . Give EpiPen or Auvi-Q right away if there are signs of a severe reaction: sneezing, wheezing, cough, shortness of breath, swelling of the mouth or throat, change in voice quality, vomiting or sudden quietness. If there is a single episode of vomiting while or immediately after taking the dose and there are NO other problems, you may observe without treatment but if any other symptoms develop, administer epinephrine immediately.  . Go to the ER right away if epinephrine is given.  . Call before the next dose if there is a new illness.  . Have epinephrine available at all times!!  . Let us know by phone or email about minor problems that occur more than once.  Marland Kitchen Keep track of your doses remaining so that you don't run out unexpectedly.  . Be alert to your OIT child at brother's or sister's soccer game or other sporting event; they are likely to run around  as much as children on the field.  . Call right away for extra dosing solution if the supply is low or if an appointment must be rescheduled.   DON'T  . Don't give the dose on an empty stomach.  . Don't exercise for at least 2 hours after the OIT dose. No activity that increases the heart rate or increases body temperature.  . Don't give an escalation dose without calling the office first if it has been more than 24 hours since the last dose.  . Don't come for a dose increase if there is an active illness or asthma flare. Call to reschedule after the illness has resolved.  . Don't treat a mild reaction (a few hives, mouth itch, mild abdominal pain) that resolves within 1 hour.

## 2020-03-26 NOTE — Progress Notes (Signed)
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  - Continue the following dose until the next visit: 7600 mg whole peanuts (8 peanuts) - The following physician is on call for the next week: Dr. Dellis Anes [phone] - Feel free to reach out for any questions or concerns.   2.  Return in about 1 week (around 04/02/2020).  Subjective:   Patricia [name] is a 29 y.o. female presenting today for follow up of  Chief Complaint  Patient presents with  . Allergic Reaction    Peanuts    Patricia [name] has a history of the following: Patient Active Problem List   Diagnosis Date Noted  . 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 Fatty liver 11/29/2019  . Diarrhea 09/27/2019  . Elevated LFTs 06/17/2017  . Calculus of gallbladder with acute cholecystitis without obstruction   . Nausea vomiting and diarrhea   . RUQ abdominal pain   . Transaminitis   . Biliary colic   . Gall stones, common bile duct   . Abdominal pain 06/04/2017  . 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 Chronic hypertension in pregnancy 04/13/2017  . Chronic hypertension affecting pregnancy 11/08/2016  . 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 Supervision of high risk pregnancy, antepartum 09/27/2016  . History of gestational hypertension 09/27/2016  . History of shoulder dystocia in prior pregnancy, currently pregnant 09/27/2016  . URI (upper respiratory infection) 08/01/2015  . Persistent cough 08/01/2015  . Obesity, Class III, BMI 40-49.9 (morbid obesity) (HCC) 09/23/2014  . Headache 09/23/2014  . PCO (polycystic ovaries) 03/08/2014    History obtained from: chart review and patient.  Patricia [name] Primary Care Provider is Benita Stabile, MD.     [name] is a 29 y.o. female presenting to increase her peanut OIT dose. She completed the peanut rapid escalation in December of 2020. Her current dose is 5700 mg whole peanuts (6 peanuts). Patricia [name] tolerated her dose without oral itching, stomach pain, diarrhea, vomiting, itching or hives.   She denies any symptoms of eosinophilic esophagitis, including reflux, stomach pain, difficulty swallowing, weight loss or chest pain.   Otherwise, there have been no changes to her past medical history, surgical history, family history, or social history.    Review of Systems: a 14-point review of systems is pertinent for what is mentioned in HPI.  Otherwise, all other systems were negative.  Constitutional: negative other than that listed in the HPI Eyes: negative other than that listed in the HPI Ears, nose, mouth, throat, and face: negative other than that listed in the HPI Respiratory: negative other than that listed in the HPI Cardiovascular: negative other than that listed in the HPI Gastrointestinal: negative other than that listed in the HPI Genitourinary: negative other than that listed in the HPI Integument: negative other than that listed in the HPI Hematologic: negative other than that listed in the HPI Musculoskeletal: negative other than that listed in the HPI Neurological: negative other than that listed in the HPI Allergy/Immunologic: negative other than that listed in the HPI    Objective:   Blood pressure 138/88, pulse 88, temperature 98 F (36.7 C), temperature source Temporal, resp. rate 18, SpO2 98 %, unknown if currently breastfeeding. There is no height  or weight on file to calculate BMI.   Physical Exam:  General: Alert, interactive, in no acute distress. Eyes: No conjunctival injection bilaterally, no discharge on the right, no discharge on the left and no Horner-Trantas dots present. PERRL bilaterally. EOMI without pain. No photophobia.  Ears:  Right TM pearly gray with normal light reflex, Left TM pearly gray with normal light reflex, Right TM intact without perforation and Left TM intact without perforation.  Nose/Throat: External nose within normal limits, nasal crease present and septum midline. Turbinates edematous without discharge. Posterior oropharynx mildly erythematous without cobblestoning in the posterior oropharynx. Tonsils 2+ without exudates.  Tongue without thrush. Lungs: Clear to auscultation without wheezing, rhonchi or rales. No increased work of breathing. CV: Normal S1/S2. No murmurs. Capillary refill <2 seconds.  Skin: Warm and dry, without lesions or rashes. Neuro:   Grossly intact. No focal deficits appreciated. Responsive to questions.    Spirometry: N/A  Rescue Medications (if needed):  Epinephrine dose: 0.3 mg Benadryl dose: 50 mg (20 mL)  Patricia [name] was given 5700 mg whole peanuts (6 peanuts).  Time Patricia [name] was given the dose: 2:45 PM Time Patricia [name] was discharged: 4:00 PM  Given the up dosing today, Patricia [name] will be sent home with the following dose: 5700 mg whole peanuts (6 peanuts).  Patricia Marvel, MD [facility] and [facility] [facility] [place]

## 2020-04-02 ENCOUNTER — Encounter: Payer: Self-pay | Admitting: Allergy & Immunology

## 2020-04-02 ENCOUNTER — Other Ambulatory Visit: Payer: Self-pay

## 2020-04-02 ENCOUNTER — Ambulatory Visit (INDEPENDENT_AMBULATORY_CARE_PROVIDER_SITE_OTHER): Payer: 59 | Admitting: Allergy & Immunology

## 2020-04-02 VITALS — BP 128/82 | HR 80 | Resp 18

## 2020-04-02 DIAGNOSIS — T7800XD Anaphylactic reaction due to unspecified food, subsequent encounter: Secondary | ICD-10-CM | POA: Diagnosis not present

## 2020-04-02 NOTE — Progress Notes (Signed)
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  - Continue the following dose until the next visit: 10 peanuts - The following physician is on call for the next week: Dr. Delorse Lek [phone]). - Feel free to reach out for any questions or concerns.   2.  Return in about 1 week (around 04/09/2020)  Subjective:   Patricia [name] is a 29 y.o. female presenting today for follow up of  Chief Complaint  Patient presents with  . OIT Updose    Patricia [name] has a history of the following: Patient Active Problem List   Diagnosis Date Noted  . 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 Fatty liver 11/29/2019  . Diarrhea 09/27/2019  . Elevated LFTs 06/17/2017  . Calculus of gallbladder with acute cholecystitis without obstruction   . Nausea vomiting and diarrhea   . RUQ abdominal pain   . Transaminitis   . Biliary colic   . Gall stones, common bile duct   . Abdominal pain 06/04/2017  . 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 Chronic hypertension in pregnancy 04/13/2017  . Chronic hypertension affecting pregnancy 11/08/2016  . 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 Supervision of high risk pregnancy, antepartum 09/27/2016  . History of gestational hypertension 09/27/2016  . History of shoulder dystocia in prior pregnancy, currently pregnant 09/27/2016  . URI (upper respiratory infection) 08/01/2015  . Persistent cough 08/01/2015  . Obesity, Class III, BMI 40-49.9 (morbid obesity) (HCC) 09/23/2014  . Headache 09/23/2014  . PCO (polycystic ovaries) 03/08/2014    History obtained from: chart review and patient.  Patricia [name] Primary Care Provider is Benita Stabile, MD.     Patricia [name] is a 29 y.o.  female presenting to increase her peanut OIT dose. She completed the peanut rapid escalation in December of 2020. Her current dose is 7600 mg whole peanuts (8 peanuts). Patricia [name] tolerated her dose without oral itching, stomach pain, diarrhea, vomiting, itching or hives.   She denies any symptoms of eosinophilic esophagitis, including reflux, stomach pain, difficulty swallowing, weight loss or chest pain.  Otherwise, there have been no changes to her past medical history, surgical history, family history, or social history.    Review of Systems: a 14-point review of systems is pertinent for what is mentioned in HPI.  Otherwise, all other systems were negative.  Constitutional: negative other than that listed in the HPI Eyes: negative other than that listed in the HPI Ears, nose, mouth, throat, and face: negative other than that listed in the HPI Respiratory: negative other than that listed in the HPI Cardiovascular: negative other than that listed in the HPI Gastrointestinal: negative other than that listed in the HPI Genitourinary: negative other than that listed in the HPI Integument: negative other than that listed in the HPI Hematologic: negative other than that listed in the HPI Musculoskeletal: negative other than that listed in the HPI Neurological: negative other than that listed in the HPI Allergy/Immunologic: negative other than that listed in the HPI    Objective:   Blood pressure 128/82, pulse 80, resp. rate 18, SpO2 100 %, unknown if currently breastfeeding. There is no height or weight on file to calculate BMI.   Physical Exam:  General: Alert, interactive, in no  acute distress. Very plsant female. Cooperative with the exam.  Eyes: No conjunctival injection bilaterally, no discharge on the right, no discharge on the left and no Horner-Trantas dots present. PERRL bilaterally. EOMI without pain. No photophobia.  Ears: Right TM pearly gray with normal light reflex, Left TM  pearly gray with normal light reflex, Right TM intact without perforation and Left TM intact without perforation.  Nose/Throat: External nose within normal limits and septum midline. Turbinates edematous and pale without discharge. Posterior oropharynx mildly erythematous without cobblestoning in the posterior oropharynx. Tonsils 2+ without exudates.  Tongue without thrush. Lungs: Clear to auscultation without wheezing, rhonchi or rales. No increased work of breathing. CV: Normal S1/S2. No murmurs. Capillary refill <2 seconds.  Skin: Warm and dry, without lesions or rashes. Neuro:   Grossly intact. No focal deficits appreciated. Responsive to questions.    Spirometry: N/A  Rescue Medications (if needed):  Epinephrine dose: 0.3 mg Benadryl dose: 50 mg (20 mL)  Patricia [name] was given 10 peanuts   Patricia [name] was given the dose: 2:45 PM Patricia Patricia [name] was discharged: 3:55 PM  Given the up dosing today, Patricia [name] will be sent home with the following dose: 10 peanuts   [name], MD [facility] and [place] [facility] [place]

## 2020-04-02 NOTE — Patient Instructions (Addendum)
1. 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  - Continue the following dose until the next visit: 10 peanuts - The following physician is on call for the next week: Dr. Delorse Lek [phone]). - Feel free to reach out for any questions or concerns.   2.  Return in about 1 week (around 04/09/2020).   It was a pleasure to see you again today!  Websites that have reliable patient information: 1. American Academy of Asthma, Allergy, and Immunology: www.aaaai.org 2. Food Allergy Research and Education (FARE): foodallergy.org 3. Mothers of Asthmatics: http://www.asthmacommunitynetwork.org 4. American College of Allergy, Asthma, and Immunology: www.acaai.org    Food Oral Immunotherapy Do's and Don'ts   DO . Give the dose after having at least a snack.  Marland Kitchen Keep liquids refrigerated.  . Give escalation doses 21-27 hours apart.  . Call the office if a dose is missed. Do not give the next dose before getting instructions from our office.  . Call if there are any signs of reaction.  . Give EpiPen or Auvi-Q right away if there are signs of a severe reaction: sneezing, wheezing, cough, shortness of breath, swelling of the mouth or throat, change in voice quality, vomiting or sudden quietness. If there is a single episode of vomiting while or immediately after taking the dose and there are NO other problems, you may observe without treatment but if any other symptoms develop, administer epinephrine immediately.  . Go to the ER right away if epinephrine is given.  . Call before the next dose if there is a new illness.  . Have epinephrine available at all times!!  . Let us know by phone or email about minor problems that occur more than once.  Marland Kitchen Keep track of your doses remaining so that you don't run out unexpectedly.  . Be alert to your OIT child at brother's or sister's soccer game or other sporting event; they are likely to run around as much as children on  the field.  . Call right away for extra dosing solution if the supply is low or if an appointment must be rescheduled.   DON'T  . Don't give the dose on an empty stomach.  . Don't exercise for at least 2 hours after the OIT dose. No activity that increases the heart rate or increases body temperature.  . Don't give an escalation dose without calling the office first if it has been more than 24 hours since the last dose.  . Don't come for a dose increase if there is an active illness or asthma flare. Call to reschedule after the illness has resolved.  . Don't treat a mild reaction (a few hives, mouth itch, mild abdominal pain) that resolves within 1 hour.

## 2020-04-07 ENCOUNTER — Other Ambulatory Visit: Payer: Self-pay | Admitting: Gastroenterology

## 2020-04-07 DIAGNOSIS — R197 Diarrhea, unspecified: Secondary | ICD-10-CM

## 2020-04-07 DIAGNOSIS — R1011 Right upper quadrant pain: Secondary | ICD-10-CM

## 2020-04-09 ENCOUNTER — Encounter: Payer: Self-pay | Admitting: Allergy & Immunology

## 2020-04-09 ENCOUNTER — Other Ambulatory Visit: Payer: Self-pay

## 2020-04-09 ENCOUNTER — Ambulatory Visit (INDEPENDENT_AMBULATORY_CARE_PROVIDER_SITE_OTHER): Payer: 59 | Admitting: Allergy & Immunology

## 2020-04-09 VITALS — BP 116/60 | HR 82 | Resp 18

## 2020-04-09 DIAGNOSIS — L2089 Other atopic dermatitis: Secondary | ICD-10-CM

## 2020-04-09 DIAGNOSIS — J4520 Mild intermittent asthma, uncomplicated: Secondary | ICD-10-CM | POA: Diagnosis not present

## 2020-04-09 DIAGNOSIS — T7800XD Anaphylactic reaction due to unspecified food, subsequent encounter: Secondary | ICD-10-CM | POA: Diagnosis not present

## 2020-04-09 DIAGNOSIS — J302 Other seasonal allergic rhinitis: Secondary | ICD-10-CM

## 2020-04-09 DIAGNOSIS — J3089 Other allergic rhinitis: Secondary | ICD-10-CM | POA: Diagnosis not present

## 2020-04-09 NOTE — Progress Notes (Signed)
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 We are going to continue with Dupixent since she has made such excellent improvement with this. Her quality of life is improved and her skin has nearly all healed. She continues to have a hyperkeratotic lesion on her right thumb but even this is improving over time on the [place]. Asthma is well controlled without frequent albuterol use. She is now ingesting 12 peanuts daily for her oral immunotherapy and tolerating this without a problem. P/SAR is controlled with her nasal steroid and antihistamine.    Subjective:   JUSTYNE ROELL is a 29 y.o. female presenting today for follow up of  Chief Complaint  Patient presents with  . Food Intolerance    Peanuts     HUE STEVESON has a history of the following: Patient Active Problem List   Diagnosis Date Noted  . 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 Fatty liver 11/29/2019  . Diarrhea 09/27/2019  . Elevated LFTs 06/17/2017  . Calculus of gallbladder with acute cholecystitis without obstruction   . Nausea vomiting and diarrhea   . RUQ abdominal pain   . Transaminitis   . Biliary colic   . Gall stones, common bile duct   . Abdominal pain 06/04/2017  . 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 Chronic hypertension in pregnancy 04/13/2017  . Chronic hypertension affecting pregnancy 11/08/2016  . 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 Supervision of high risk pregnancy, antepartum 09/27/2016  . History of  gestational hypertension 09/27/2016  . History of shoulder dystocia in prior pregnancy, currently pregnant 09/27/2016  . URI (upper respiratory infection) 08/01/2015  . Persistent cough 08/01/2015  . Obesity, Class III, BMI 40-49.9 (morbid obesity) ([place]) 09/23/2014  . Headache 09/23/2014  . PCO (polycystic ovaries) 03/08/2014    History obtained from: chart review and patient.  Lametria is a 29 y.o. female presenting for a follow up visit. She has a history of P/SAR, mild intermittent asthma, allergic rhinitis, and food allergy. At the last regular office visit in November 2020, we made the decision to start [place] for control of her atopic dermatitis. She remains on albuterol as needed for her breathing. We also continued on Xyzal and Nasacort for her environmental allergies. We made sure that her [place]ona Luna was up to date.   Since the last visit, she has done well. She received her first dose of Dupixent in the office in late 2020 and has been administering it to herself every two weeks at home since that time without any problems. She previously had lesions up and down her arms, especially on her hands, as well as her face. She has noticed that these have all but disappeared with the [place]. She is using her emollients, but she is not using her steroid creams very often at all. She has needed no antibiotics or systemic steroids for her skin since starting the [place]. She does still have a thickened area on her right thumb that continues to be a problem, although it seems to have softened since starting the [place] therapy.  She reports sleeping better at night without the intense itching that she was experiencing earlier.  Gracia's asthma has been well controlled. She has not required rescue medication, experienced nocturnal awakenings due to lower respiratory symptoms, nor have activities of daily living been limited. She has required no Emergency Department or Urgent Care visits for her asthma. She  has required zero courses of systemic steroids for asthma exacerbations since the last visit. ACT score today is 25, indicating excellent asthma symptom control.  Environmental allergies are controlled with Xyzal daily and Nasacort PRN. She does not like the nasal spray all that much. She has not needed any antibiotics for sinusitis since the last visit at all.   She is on OIT for her peanut allergy. Today she up-dosed to 12 peanuts and is doing remarkably well. She has had no systemic reactions to this at all. Early on, she did have some diarrhea although this was more attributed to her underlying GI issues than the peanut OIT.   Otherwise, there have been no changes to her past medical history, surgical history, family history, or social history.    Review of Systems  Constitutional: Negative.  Negative for chills, fever, malaise/fatigue and weight loss.  HENT: Negative.  Negative for congestion, ear discharge, ear pain and sore throat.   Eyes: Negative for pain, discharge and redness.  Respiratory: Negative for cough, sputum production, shortness of breath and wheezing.   Cardiovascular: Negative.  Negative for chest pain and palpitations.  Gastrointestinal: Negative for abdominal pain, constipation, diarrhea, heartburn, nausea and vomiting.  Skin: Negative.  Negative for itching and rash.  Neurological: Negative for dizziness and headaches.  Endo/Heme/Allergies: Negative for environmental allergies. Does not bruise/bleed easily.       Objective:   Blood pressure 126/82, pulse 92, resp. rate 18, SpO2 100 %, unknown if currently breastfeeding. There is no height or weight on file to calculate BMI.   Physical Exam:  Physical Exam  Constitutional: She appears well-developed.  HENT:  Head: Normocephalic and atraumatic.  Right Ear: Tympanic membrane, external ear and ear canal normal.  Left Ear: Tympanic membrane and ear canal normal.  Nose: No mucosal edema, rhinorrhea, nasal  deformity or septal deviation. No epistaxis. Right sinus exhibits no maxillary sinus tenderness and no frontal sinus tenderness. Left sinus exhibits no maxillary sinus tenderness and no frontal sinus tenderness.  Mouth/Throat: Uvula is midline and oropharynx is clear and moist. Mucous membranes are not pale and not dry.  Eyes: Pupils are equal, round, and reactive to light. Conjunctivae and EOM are normal. Right eye exhibits no chemosis and no discharge. Left eye exhibits no chemosis and no discharge. Right conjunctiva is not injected. Left conjunctiva is not injected.  Cardiovascular: Normal rate, regular rhythm and normal heart sounds.  Respiratory: Effort normal and breath sounds normal. No accessory muscle usage. No tachypnea. No respiratory distress. She has no wheezes. She has no rhonchi. She has no rales. She exhibits no tenderness.  Lymphadenopathy:    She has no cervical adenopathy.  Neurological: She is alert.  Skin: No abrasion, no petechiae and no rash noted. Rash is not papular, not vesicular and not urticarial. No erythema. No pallor.  She does have a hyperkeratotic lesion in the anatomic snuffbox region of her left hand. However, it does appear smoother than it has been at previous visits. Over time, her hyperpigmented lesions on her arms from chronic eczema have disappeared.   Psychiatric: She has a normal mood and affect.  Diagnostic studies: none     [name], MD  [facility] [place]

## 2020-04-09 NOTE — Patient Instructions (Addendum)
1. 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  - Continue the following dose until the next visit: 12 peanuts - The following physician is on call for the next week: Dr. Dellis Anes [phone]). - Feel free to reach out for any questions or concerns.   2.  Return in about 4 weeks (around 05/07/2020) for 24-peanut challenge.   It was a pleasure to see you again today!  Websites that have reliable patient information: 1. American Academy of Asthma, Allergy, and Immunology: www.aaaai.org 2. Food Allergy Research and Education (FARE): foodallergy.org 3. Mothers of Asthmatics: http://www.asthmacommunitynetwork.org 4. American College of Allergy, Asthma, and Immunology: www.acaai.org    Food Oral Immunotherapy Do's and Don'ts   DO . Give the dose after having at least a snack.  Marland Kitchen Keep liquids refrigerated.  . Give escalation doses 21-27 hours apart.  . Call the office if a dose is missed. Do not give the next dose before getting instructions from our office.  . Call if there are any signs of reaction.  . Give EpiPen or Auvi-Q right away if there are signs of a severe reaction: sneezing, wheezing, cough, shortness of breath, swelling of the mouth or throat, change in voice quality, vomiting or sudden quietness. If there is a single episode of vomiting while or immediately after taking the dose and there are NO other problems, you may observe without treatment but if any other symptoms develop, administer epinephrine immediately.  . Go to the ER right away if epinephrine is given.  . Call before the next dose if there is a new illness.  . Have epinephrine available at all times!!  . Let us know by phone or email about minor problems that occur more than once.  Marland Kitchen Keep track of your doses remaining so that you don't run out unexpectedly.  . Be alert to your OIT child at brother's or sister's soccer game or other sporting event; they are likely to run  around as much as children on the field.  . Call right away for extra dosing solution if the supply is low or if an appointment must be rescheduled.   DON'T  . Don't give the dose on an empty stomach.  . Don't exercise for at least 2 hours after the OIT dose. No activity that increases the heart rate or increases body temperature.  . Don't give an escalation dose without calling the office first if it has been more than 24 hours since the last dose.  . Don't come for a dose increase if there is an active illness or asthma flare. Call to reschedule after the illness has resolved.  . Don't treat a mild reaction (a few hives, mouth itch, mild abdominal pain) that resolves within 1 hour.

## 2020-04-12 ENCOUNTER — Encounter: Payer: Self-pay | Admitting: Allergy & Immunology

## 2020-04-15 ENCOUNTER — Telehealth: Payer: Self-pay

## 2020-04-15 NOTE — Telephone Encounter (Signed)
Spoke to Patricia [name] just to confirm she felt comfortable measuring out the peanuts on her scale and she verbally told me she felt very comfortable. Patricia [name] stated that Dr. Dellis Anes calibrated and went over with her how to measure out the peanuts. I told Patricia [name] once her son Patricia [name] completes his peanut powder she will be measuring his remaining peanut doses. Patricia [name] was fine with that.

## 2020-04-15 NOTE — Telephone Encounter (Signed)
Awesome - thanks for taking care of this.   [name], MD [facility]

## 2020-04-16 ENCOUNTER — Ambulatory Visit: Payer: 59 | Admitting: Allergy & Immunology

## 2020-05-07 ENCOUNTER — Ambulatory Visit (INDEPENDENT_AMBULATORY_CARE_PROVIDER_SITE_OTHER): Payer: 59 | Admitting: Allergy & Immunology

## 2020-05-07 ENCOUNTER — Encounter: Payer: Self-pay | Admitting: Allergy & Immunology

## 2020-05-07 ENCOUNTER — Other Ambulatory Visit: Payer: Self-pay

## 2020-05-07 VITALS — BP 130/82 | HR 82 | Resp 17

## 2020-05-07 DIAGNOSIS — J302 Other seasonal allergic rhinitis: Secondary | ICD-10-CM

## 2020-05-07 DIAGNOSIS — L2089 Other atopic dermatitis: Secondary | ICD-10-CM

## 2020-05-07 DIAGNOSIS — J3089 Other allergic rhinitis: Secondary | ICD-10-CM

## 2020-05-07 DIAGNOSIS — T7800XD Anaphylactic reaction due to unspecified food, subsequent encounter: Secondary | ICD-10-CM | POA: Diagnosis not present

## 2020-05-07 DIAGNOSIS — J4520 Mild intermittent asthma, uncomplicated: Secondary | ICD-10-CM | POA: Diagnosis not present

## 2020-05-07 NOTE — Patient Instructions (Addendum)
1. 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 - Therefore, you can essentially "free eat" peanut products, as long as your take your maintenance dose.  - Continue the following dose until the next visit: 7600 mg whole peanuts (8 peanuts) - The following physician is on call for the next week: Dr. Dellis Anes [phone]). - Feel free to reach out for any questions or concerns.   2.  Return in about 6 months (around 11/06/2020).   It was a pleasure to see you again today!  Websites that have reliable patient information: 1. American Academy of Asthma, Allergy, and Immunology: www.aaaai.org 2. Food Allergy Research and Education (FARE): foodallergy.org 3. Mothers of Asthmatics: http://www.asthmacommunitynetwork.org 4. American College of Allergy, Asthma, and Immunology: www.acaai.org    Food Oral Immunotherapy Do's and Don'ts   DO . Give the dose after having at least a snack.  Marland Kitchen Keep liquids refrigerated.  . Give escalation doses 21-27 hours apart.  . Call the office if a dose is missed. Do not give the next dose before getting instructions from our office.  . Call if there are any signs of reaction.  . Give EpiPen or Auvi-Q right away if there are signs of a severe reaction: sneezing, wheezing, cough, shortness of breath, swelling of the mouth or throat, change in voice quality, vomiting or sudden quietness. If there is a single episode of vomiting while or immediately after taking the dose and there are NO other problems, you may observe without treatment but if any other symptoms develop, administer epinephrine immediately.  . Go to the ER right away if epinephrine is given.  . Call before the next dose if there is a new illness.  . Have epinephrine available at all times!!  . Let us know by phone or email about minor problems that occur more than once.  Marland Kitchen Keep track of your doses remaining so that you don't run out unexpectedly.  . Be  alert to your OIT child at brother's or sister's soccer game or other sporting event; they are likely to run around as much as children on the field.  . Call right away for extra dosing solution if the supply is low or if an appointment must be rescheduled.   DON'T  . Don't give the dose on an empty stomach.  . Don't exercise for at least 2 hours after the OIT dose. No activity that increases the heart rate or increases body temperature.  . Don't give an escalation dose without calling the office first if it has been more than 24 hours since the last dose.  . Don't come for a dose increase if there is an active illness or asthma flare. Call to reschedule after the illness has resolved.  . Don't treat a mild reaction (a few hives, mouth itch, mild abdominal pain) that resolves within 1 hour.

## 2020-05-07 NOTE — Progress Notes (Signed)
[name]:  Date of Service/Encounter:  05/07/20   Assessment:   Anaphylaxis to peanut (on OIT)  Plan/Recommendations:   1. 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 - Therefore, you can essentially "free eat" peanut products, as long as your take your maintenance [name].  - Continue the following [name] until the next visit: 7600 mg whole peanuts (8 peanuts) - The following physician is on call for the next week: Dr. Ernst Bowler [phone]). - Feel free to reach out for any questions or concerns.   2.  Return in about 6 months (around 11/06/2020).  Subjective:   Patricia [name] is a 29 y.o. female presenting today for follow up of No chief complaint on file.   Patricia [name] has a history of the following: Patient Active Problem List   Diagnosis Date Noted  . 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 Fatty liver 11/29/2019  . Diarrhea 09/27/2019  . Elevated LFTs 06/17/2017  . Calculus of gallbladder with acute cholecystitis without obstruction   . Nausea vomiting and diarrhea   . RUQ abdominal pain   . Transaminitis   . Biliary colic   . Gall stones, common bile duct   . Abdominal pain 06/04/2017  . 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 Chronic hypertension in pregnancy 04/13/2017  . Chronic hypertension affecting pregnancy 11/08/2016  . 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 Supervision of high risk pregnancy, antepartum 09/27/2016  . History of gestational hypertension 09/27/2016  . History of shoulder dystocia in prior pregnancy, currently pregnant 09/27/2016  . URI (upper respiratory infection) 08/01/2015  . Persistent cough 08/01/2015  . Obesity, Class III, BMI 40-49.9 (morbid obesity) ([place]) 09/23/2014  . Headache 09/23/2014  . PCO (polycystic ovaries) 03/08/2014    History obtained from: chart review  and patient.  Patricia [name] Primary Care Provider is Celene Squibb, MD.     Patricia [name] is a 29 y.o. female presenting to increase her peanut OIT [name]. She completed the peanut rapid escalation in December of 2020. Her current [name] is 12 peanuts. Patricia [name] without oral itching, stomach pain, diarrhea, vomiting, itching or hives. She has been on this [name] for one month.   She denies any symptoms of eosinophilic esophagitis, including reflux, stomach pain, difficulty swallowing, weight loss or chest pain.   Otherwise, there have been no changes to her past medical history, surgical history, family history, or social history.    Review of Systems: a 14-point review of systems is pertinent for what is mentioned in HPI.  Otherwise, all other systems were negative.  Constitutional: negative other than that listed in the HPI Eyes: negative other than that listed in the HPI Ears, nose, mouth, throat, and face: negative other than that listed in the HPI Respiratory: negative other than that listed in the HPI Cardiovascular: negative other than that listed in the HPI Gastrointestinal: negative other than that listed in the HPI Genitourinary: negative other than that listed in the HPI Integument: negative other than that listed in the HPI Hematologic: negative other than that listed in the HPI Musculoskeletal: negative other than that listed in the HPI Neurological: negative other than that listed in the HPI Allergy/Immunologic: negative other than that listed in the HPI    Objective:   Blood pressure 130/82, pulse 82, resp. rate 17, SpO2 95 %, unknown if currently breastfeeding.  There is no height or weight on file to calculate BMI.   Physical Exam:  General: Alert, interactive, in no acute distress. Eyes: No conjunctival injection bilaterally, no discharge on the right, no discharge on the left and no Horner-Trantas dots present. PERRL bilaterally. EOMI without pain.  No photophobia.  Ears: Right TM pearly gray with normal light reflex, Left TM pearly gray with normal light reflex, Right TM intact without perforation and Left TM intact without perforation.  Nose/Throat: External nose within normal limits. Turbinates minimally edematous without discharge. Posterior oropharynx mildly erythematous without cobblestoning in the posterior oropharynx. Tonsils 2+ without exudates.  Tongue without thrush. Lungs: Clear to auscultation without wheezing, rhonchi or rales. No increased work of breathing. CV: Normal S1/S2. No murmurs. Capillary refill <2 seconds.  Skin: Dry, erythematous, excoriated patches on the right hand. Neuro:   Grossly intact. No focal deficits appreciated. Responsive to questions.    Spirometry: N/A  Anxiety screening tool: N/A  Rescue Medications (if needed):  Epinephrine [name]: 0.3 mg Benadryl [name]: 50 mg (20 mL)  Patricia [name] was given 24 peanuts.  Time Patricia [name] was given the [name]: 3:11 PM Time Patricia [name] was discharged: 4:25 PM  She tolerated this without adverse event. Therefore, we are going to continue with maintenance dosing of 8 peanuts daily. We will plan to continue this for 12-24 months and then consider decreasing down to 8 peanuts three times weekly.    [name], MD [facility]

## 2020-05-08 ENCOUNTER — Encounter: Payer: Self-pay | Admitting: Allergy & Immunology

## 2020-05-13 ENCOUNTER — Telehealth: Payer: Self-pay | Admitting: Gastroenterology

## 2020-05-13 NOTE — Telephone Encounter (Signed)
Pt is aware of her OV with [facility] on 7/7. She said she was having abdominal pain like she did before and wanted to know what [facility] would recommend. [phone]

## 2020-05-14 ENCOUNTER — Other Ambulatory Visit: Payer: Self-pay | Admitting: Gastroenterology

## 2020-05-14 DIAGNOSIS — R1011 Right upper quadrant pain: Secondary | ICD-10-CM

## 2020-05-14 MED ORDER — PANTOPRAZOLE SODIUM 40 MG PO TBEC: 40.0000 mg | DELAYED_RELEASE_TABLET | Freq: Every day | ORAL | 2 refills | Status: DC

## 2020-05-14 NOTE — Telephone Encounter (Signed)
Rx sent 

## 2020-05-14 NOTE — Telephone Encounter (Signed)
Called pt and notified her of providers recommendation.Pt stated she  d/c protonix sinice jan . Pt stated she is doing a supervised wt loss program so she is not eating any sweets/ sugary/ fatty food/ sodas. Pt states she has lost 23 pounds. Pt stated she will not take any nsaid and will only take tylenol as needed

## 2020-05-14 NOTE — Telephone Encounter (Signed)
Called pt verified name and dob abd pain began. And she stated it is more so in the rt flank area on 6/21 her last bm was today and stated it was normal and that she is pretty regular. Denies blood in her stools or any any fever, vomiting or diarrhea. Pt states she has tried naproxen and it did help until yesterday.

## 2020-05-14 NOTE — Telephone Encounter (Signed)
Has she tapered off Protonix? Has she been avoiding fired/fatty/greasy foods/sugary/sweet foods/soda? Would recommend she not take any NSAIDs including Naproxen as this can worsen symptoms if she has gastritis which is what I suspected initially. She should use Tylenol as needed.   Further recommendations once I receive answer to the above questions.

## 2020-05-14 NOTE — Telephone Encounter (Signed)
Noted. She had improvement in her abdominal pain on Protonix historically. We can resume this medication to see if her symptoms once again improve. If she is agreeable, I will send in a new Rx.

## 2020-05-14 NOTE — Telephone Encounter (Signed)
Pt is agreeable to try protonix again. Please sen into [facility]

## 2020-05-20 ENCOUNTER — Other Ambulatory Visit (HOSPITAL_COMMUNITY)
Admission: RE | Admit: 2020-05-20 | Discharge: 2020-05-20 | Disposition: A | Discharge status: Home / Self Care | Payer: 59 | Source: Ambulatory Visit | Attending: Adult Health | Admitting: Adult Health

## 2020-05-20 ENCOUNTER — Other Ambulatory Visit: Payer: Self-pay

## 2020-05-20 ENCOUNTER — Ambulatory Visit (INDEPENDENT_AMBULATORY_CARE_PROVIDER_SITE_OTHER): Payer: 59 | Admitting: Adult Health

## 2020-05-20 ENCOUNTER — Encounter: Payer: Self-pay | Admitting: Adult Health

## 2020-05-20 VITALS — BP 126/90 | HR 72 | Ht 64.5 in | Wt 271.0 lb

## 2020-05-20 DIAGNOSIS — F329 Major depressive disorder, single episode, unspecified: Secondary | ICD-10-CM | POA: Diagnosis not present

## 2020-05-20 DIAGNOSIS — F32A Depression, unspecified: Secondary | ICD-10-CM | POA: Insufficient documentation

## 2020-05-20 DIAGNOSIS — Z3046 Encounter for surveillance of implantable subdermal contraceptive: Secondary | ICD-10-CM

## 2020-05-20 DIAGNOSIS — Z01419 Encounter for gynecological examination (general) (routine) without abnormal findings: Secondary | ICD-10-CM | POA: Insufficient documentation

## 2020-05-20 MED ORDER — SERTRALINE HCL 50 MG PO TABS: 50.0000 mg | ORAL_TABLET | Freq: Every day | ORAL | 3 refills | Status: DC

## 2020-05-20 NOTE — Progress Notes (Addendum)
Patient ID: Patricia [name], female   DOB: Feb 19, 1991, 29 y.o.   MRN: [id number] History of Present Illness: Patricia [name] is a 29 year old black female,married, [id number], in for well woman gyn exam and pap, and wants nexplanon removed.Husband in jail til 2024, does not need birth control at this time. PCP is Dr Margo Aye.    Current Medications, Allergies, Past Medical History, Past Surgical History, Family History and Social History were reviewed in [employer] record.     Review of Systems:  Patient denies any headaches, hearing loss, fatigue, blurred vision, shortness of breath, chest pain, abdominal pain, problems with bowel movements, urination, or intercourse(not active). No joint pain or mood swings.   Physical Exam:BP 126/90 (BP Location: Right Arm, Patient Position: Sitting, Cuff Size: Large)   Pulse 72   Ht 5' 4.5" (1.638 m)   Wt 271 lb (122.9 kg)   LMP 04/02/2020   Breastfeeding No   BMI 45.80 kg/m   General:  Well developed, well nourished, no acute distress Skin:  Warm and dry Neck:  Midline trachea, normal thyroid, good ROM, no lymphadenopathy Lungs; Clear to auscultation bilaterally Breast:  No dominant palpable mass, retraction, or nipple discharge Cardiovascular: Regular rate and rhythm Abdomen:  Soft, non tender, no hepatosplenomegaly Pelvic:  External genitalia is normal in appearance, no lesions.  The vagina is normal in appearance. Urethra has no lesions or masses. The cervix is bulbous. Pa with GC/CHL and high risk HPV 16/18 genotyping performed. Uterus is felt to be normal size, shape, and contour.  No adnexal masses or tenderness noted.Bladder is non tender, no masses felt. Extremities/musculoskeletal:  No swelling or varicosities noted, no clubbing or cyanosis Psych:  No mood changes, alert and cooperative,seems happy AA is 0 Fall risk is low PHQ 9 score is 18, no SI, is open to zoloft, she has EAP, and used them.  Examination chaperoned by Malachy Mood LPN Consent signed and time out called. Left arm cleansed with betadine, and injected with 1.5 cc 2% lidocaine and waited til numb.Under sterile technique a #11 blade was used to make small vertical incision, and a curved forceps was used to easily remove rod. Steri strips applied. Pressure dressing applied.   Impression and Plan:  1. Encounter for gynecological examination with Papanicolaou smear of cervix Pap sent Physical in 1 year Pap in 3 if normal Labs with PCP  2. Nexplanon removal Use condoms, keep clean and dry x 24 hours, no heavy lifting, keep steri strips on x 72 hours, Keep pressure dressing on x 24 hours. Follow up prn problems.  3. Depression, unspecified depression type Will rx zoloft and continue EAP Meds ordered this encounter  Medications  . sertraline (ZOLOFT) 50 MG tablet    Sig: Take 1 tablet (50 mg total) by mouth daily.    Dispense:  30 tablet    Refill:  3    Order Specific Question:   Supervising Provider    Answer:   Duane Lope H [[id number]]  -follow up in 8 weeks

## 2020-05-20 NOTE — Patient Instructions (Addendum)
Use condoms, keep clean and dry x 24 hours, no heavy lifting, keep steri strips on x 72 hours, Keep pressure dressing on x 24 hours. Follow up prn problems. Physical in 1 year Pap in 3 if normal  labs with PCP See me in 8 weeks for ROS on starting zoloft

## 2020-05-27 NOTE — Progress Notes (Signed)
Referring Provider: Benita Stabile, MD Primary Care Physician:  Benita Stabile, MD Primary GI Physician: Dr. Darrick Penna historically; will be establishing with Dr. Marletta Lor; Dr. Jena Gauss following for now.   Chief Complaint  Patient presents with  . Abdominal Pain  . Diarrhea    HPI:   Patricia [name] is a 29 y.o. female presenting today for follow-up of RUQ abdominal pain and diarrhea.  Patient had reported history of postprandial watery/greasy diarrhea with urgency since cholecystectomy 2018.  She is started on Questran in November 2020.  Prior evaluation for RUQ abdominal pain in November 2020 with CBC, CMP essentially normal, RUQ ultrasound with fatty liver, H. pylori breath test negative.  She started on Protonix 40 mg daily at that time.  At her last visit in January 2021, abdominal pain and diarrhea had improved with Protonix daily and Questran twice daily.  Regarding fatty liver, patient was working on weight loss through diet and exercise.  Plans to continue Questran twice daily, taper off Protonix as she had no history of GERD or other need for PPI other than abdominal pain that had resolved, continue with weight loss.  Plan to follow-up in 6 months.  Telephone call 05/11/2020 reporting return of abdominal pain similar to prior abdominal pain.  She had not been taking Protonix since January.  She had continue working on weight loss with [facility] and had lost 23 pounds.  She was advised to resume Protonix 40 mg daily.  Today:  Abdominal Pain: When pain started, it was persistent in the RUQ rather than triggered by meals. Was present only a couple of days before resuming Protonix. Had been pain free up to that point. Denies any regular NSAIDs. Resolved about 2 days after taking Protonix daily. Today she is pain free. Reports having other strange symptoms occurring at the same time as her abdominal pain- had whelps on her face and teeth had turned gray, mild nausea/gaggging without  vomiting. These symptoms also resolved within a couple of days. No fever or chills. Denies sick contacts, being on any antibiotics, changes in medications, or other known triggers of symptoms.   No GERD symptoms prior to resuming protonix. No dysphagia.   Diarrhea:  Taking Questran BID. Having 2-3 soft and formed BMs daily which is her normal. No brbpr or melena.   Intentionally losing weight. No alcohol, tylenol.   Past Medical History:  Diagnosis Date  . Abnormal Papanicolaou smear of cervix with positive human papilloma virus (HPV) test 05/28/2020   Pap +HPV other and [facility], will need colpo per ASCCP guidelines,immediate  risk for CIN 3+ 12.6%   . Asthma   . Cough 08/01/2015  . Fatty liver   . Migraine   . Migraines   . Nausea and vomiting during pregnancy prior to [redacted] weeks gestation 08/01/2015  . PCO (polycystic ovaries) 03/08/2014  . Polycystic disease, ovaries   . Pregnancy induced hypertension   . Pregnant 05/27/2015  . URI (upper respiratory infection) 08/01/2015    Past Surgical History:  Procedure Laterality Date  . CHOLECYSTECTOMY N/A 06/08/2017   Procedure: LAPAROSCOPIC CHOLECYSTECTOMY;  Surgeon: Franky Macho, MD;  Location: [facility];  Service: General;  Laterality: N/A;  . TONSILLECTOMY    . WISDOM TOOTH EXTRACTION      Current Outpatient Medications  Medication Sig Dispense Refill  . albuterol (PROVENTIL HFA;VENTOLIN HFA) 108 (90 Base) MCG/ACT inhaler Inhale 2 puffs into the lungs every 4 (four) hours as needed for wheezing or shortness  of breath. 1 Inhaler 3  . Biotin 5000 MCG CAPS Take 1 capsule by mouth daily.    . cetirizine (ZYRTEC) 10 MG tablet Take 10 mg by mouth daily.    . cholestyramine (QUESTRAN) 4 g packet MIX 1 PACKET AS DIRECTED AND DRINK BY MOUTH TWICE DAILY WITH MEALS 60 each 2  . clobetasol ointment (TEMOVATE) 0.05 % Apply 1 application topically 2 (two) times daily. 30 g 0  . DUPIXENT 300 MG/2ML SOPN     . metFORMIN (GLUCOPHAGE-XR) 750 MG 24 hr tablet TAKE  1 TABLET BY MOUTH  TWICE DAILY    . pantoprazole (PROTONIX) 40 MG tablet Take 1 tablet (40 mg total) by mouth daily before breakfast. 30 tablet 2  . PREVIDENT 5000 BOOSTER PLUS 1.1 % PSTE Apply topically daily.   1  . sertraline (ZOLOFT) 50 MG tablet Take 1 tablet (50 mg total) by mouth daily. 30 tablet 3  . VYVANSE 40 MG capsule Take 40 mg by mouth daily.     No current facility-administered medications for this visit.    Allergies as of 05/28/2020 - Review Complete 05/28/2020  Allergen Reaction Noted  . Peanut-containing drug products Anaphylaxis and Hives 04/27/2012  . Propranolol Shortness Of Breath 09/23/2014  . Cephalexin Hives 12/22/2011  . Imitrex [sumatriptan] Hives 10/12/2013    Family History  Problem Relation Age of Onset  . Migraines Sister   . Hypertension Brother   . Heart disease Maternal Grandmother   . Cancer Maternal Grandfather        prostate  . Stroke Paternal Grandmother   . Cancer Paternal Grandfather        prostate  . Stroke Paternal Grandfather   . Atrial fibrillation Father   . Colon cancer Neg Hx   . Allergic rhinitis Neg Hx   . Angioedema Neg Hx   . Asthma Neg Hx   . Atopy Neg Hx   . Eczema Neg Hx   . Urticaria Neg Hx   . Immunodeficiency Neg Hx     Social History   Socioeconomic History  . Marital status: Married    Spouse name: Not on file  . Number of children: Not on file  . Years of education: Not on file  . Highest education level: Not on file  Occupational History  . Not on file  Tobacco Use  . Smoking status: Never Smoker  . Smokeless tobacco: Never Used  Vaping Use  . Vaping Use: Never used  Substance and Sexual Activity  . Alcohol use: No    Comment: occassional  . Drug use: No  . Sexual activity: Not Currently    Birth control/protection: None  Other Topics Concern  . Not on file  Social History Narrative  . Not on file   Social Determinants of Health   Financial Resource Strain: Low Risk   . Difficulty of  Paying Living Expenses: Not hard at all  Food Insecurity: No Food Insecurity  . Worried About Programme researcher, broadcasting/film/video in the Last Year: Never true  . Ran Out of Food in the Last Year: Never true  Transportation Needs: No Transportation Needs  . Lack of Transportation (Medical): No  . Lack of Transportation (Non-Medical): No  Physical Activity: Sufficiently Active  . Days of Exercise per Week: 5 days  . Minutes of Exercise per Session: 30 min  Stress: Stress Concern Present  . Feeling of Stress : Very much  Social Connections: Moderately Integrated  . Frequency of Communication with Friends  and Family: More than three times a week  . Frequency of Social Gatherings with Friends and Family: Once a week  . Attends Religious Services: More than 4 times per year  . Active Member of Clubs or Organizations: No  . Attends Banker Meetings: Never  . Marital Status: Married    Review of Systems: Gen: Denies fever, chills, lightheadedness, dizziness, presyncope, syncope..  CV: Denies chest pain or palpitations. Resp: Denies dyspnea or cough. GI: See HPI Heme: See HPI  Physical Exam: BP (!) 142/94   Pulse 74   Temp (!) 97.3 F (36.3 C) (Oral)   Ht 5\' 4"  (1.626 m)   Wt 274 lb (124.3 kg)   LMP 03/31/2020 (Exact Date)   BMI 47.03 kg/m  General:   Alert and oriented. No distress noted. Pleasant and cooperative.  Head:  Normocephalic and atraumatic. Eyes:  Conjuctiva clear without scleral icterus. Heart:  S1, S2 present without murmurs appreciated. Lungs:  Clear to auscultation bilaterally. No wheezes, rales, or rhonchi. No distress.  Abdomen:  +BS, soft, non-tender and non-distended. No rebound or guarding. No HSM or masses noted. Msk:  Symmetrical without gross deformities. Normal posture. Extremities:  Without edema. Neurologic:  Alert and  oriented x4 Psych: Normal mood and affect.

## 2020-05-28 ENCOUNTER — Other Ambulatory Visit: Payer: Self-pay

## 2020-05-28 ENCOUNTER — Ambulatory Visit (INDEPENDENT_AMBULATORY_CARE_PROVIDER_SITE_OTHER): Payer: 59 | Admitting: Gastroenterology

## 2020-05-28 ENCOUNTER — Telehealth: Payer: Self-pay | Admitting: Adult Health

## 2020-05-28 ENCOUNTER — Encounter: Payer: Self-pay | Admitting: Adult Health

## 2020-05-28 ENCOUNTER — Encounter: Payer: Self-pay | Admitting: Gastroenterology

## 2020-05-28 VITALS — BP 142/94 | HR 74 | Temp 97.3°F | Ht 64.0 in | Wt 274.0 lb

## 2020-05-28 DIAGNOSIS — R197 Diarrhea, unspecified: Secondary | ICD-10-CM | POA: Diagnosis not present

## 2020-05-28 DIAGNOSIS — R87618 Other abnormal cytological findings on specimens from cervix uteri: Secondary | ICD-10-CM

## 2020-05-28 DIAGNOSIS — R1011 Right upper quadrant pain: Secondary | ICD-10-CM

## 2020-05-28 HISTORY — DX: Other abnormal cytological findings on specimens from cervix uteri: R87.618

## 2020-05-28 LAB — CYTOLOGY - PAP
Chlamydia: NEGATIVE
Comment: NEGATIVE
Comment: NEGATIVE
Comment: NEGATIVE
Comment: NORMAL
Diagnosis: HIGH — AB
HPV 16: NEGATIVE
HPV 18 / 45: NEGATIVE
High risk HPV: POSITIVE — AB
Neisseria Gonorrhea: NEGATIVE

## 2020-05-28 NOTE — Telephone Encounter (Signed)
Left message to call me about pap, and to check my chart, will have note there

## 2020-05-28 NOTE — Patient Instructions (Signed)
Continue Protonix for 2 more weeks. You may then start to taper off medication: take Protonix every other day for 1 week, then every 3 days for 1 week then stop.   Continue to monitor for any return of abdominal pain and let me know.   Avoid all NSAIDs including ibuprofem Aleve, Advil, naproxen, and anything that says "NSAID" on the package.   Continue Questran for diarrhea.  Continue working on weight loss.  Congratulations on your success thus far!  We will see back in 6 months.  Do not hesitate to call with questions or concerns prior.  Ermalinda Memos, PA-C [facility]

## 2020-05-28 NOTE — Telephone Encounter (Signed)
Patient was returning your phone call.  

## 2020-05-28 NOTE — Telephone Encounter (Signed)
Pt saw my chart message and made appt with Dr Despina Hidden 06/02/20 for colpo.

## 2020-05-29 NOTE — Assessment & Plan Note (Addendum)
Patient had reported return of postprandial RUQ abdominal pain in November 2020 with history of cholecystectomy in 2018.  Work-up with CBC, CMP, ultrasound, H. pylori breath test unrevealing.  She was started on Protonix empirically for possible gastritis or duodenitis and symptoms improved.  Notably, she also made significant dietary changes and was working on weight loss.  Discontinue Protonix in January 2021 and was asymptomatic until June 2021 where she had acute onset of persistent RUQ abdominal pain rather than being associated with meals.  Denied any regular NSAID use.  She called our office and we resumed Protonix for symptomatic improvement.  Patient reports symptoms resolved within 2 days of starting Protonix.  However, upon further discussion with patient today, she reports additional symptoms at the time of abdominal pain included whelps on her face, teeth turned gray, and mild nausea without vomiting.  All symptoms resolved within a couple of days.  Not entirely sure Protonix had anything to do with improving her abdominal pain.  Query whether she had an acute illness of unknown etiology as constellation of symptoms seem bizarre.  Denies sick contacts, antibiotics, change in medications, or any other known triggers of symptoms.  Currently she is asymptomatic.  Recommend she continue Protonix for 2 more weeks to complete a 4-week course; then, she will taper off. Advise she continue to monitor for any return of abdominal pain and let me know.  Avoid all NSAIDs. Follow-up in 6 months.  Call with questions or concerns prior.

## 2020-05-29 NOTE — Assessment & Plan Note (Signed)
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  Symptoms currently well controlled on Questran twice daily.  No alarm symptoms.  Advise she continue current medications and follow-up in 6 months.

## 2020-06-02 ENCOUNTER — Encounter: Payer: Self-pay | Admitting: Obstetrics & Gynecology

## 2020-06-02 ENCOUNTER — Ambulatory Visit (INDEPENDENT_AMBULATORY_CARE_PROVIDER_SITE_OTHER): Payer: 59 | Admitting: Obstetrics & Gynecology

## 2020-06-02 VITALS — BP 153/103 | HR 81 | Ht 64.0 in | Wt 279.0 lb

## 2020-06-02 DIAGNOSIS — R87611 Atypical squamous cells cannot exclude high grade squamous intraepithelial lesion on cytologic smear of cervix (ASC-H): Secondary | ICD-10-CM | POA: Diagnosis not present

## 2020-06-02 DIAGNOSIS — Z3202 Encounter for pregnancy test, result negative: Secondary | ICD-10-CM | POA: Diagnosis not present

## 2020-06-02 LAB — POCT URINE PREGNANCY: Preg Test, Ur: NEGATIVE

## 2020-06-02 NOTE — Progress Notes (Signed)
Colposcopy Procedure Note:  Colposcopy Procedure Note  Indications:  2021  ASCUS cannot rule out HSIL    2019 ASCCP recommendation:  Smoker:  No. New sexual partner:  No.    History of abnormal Pap: yes/several years ago  Procedure Details  The risks and benefits of the procedure and Written informed consent obtained.  Speculum placed in vagina and excellent visualization of cervix achieved, cervix swabbed x 3 with acetic acid solution.  Findings: Cervix: light AWE 05-1029 punctation no mosaicism; no biopsies taken. Vaginal inspection: normal without visible lesions. Vulvar colposcopy: vulvar colposcopy not performed.  Specimens: none  Complications: none.  Plan(Based on 2019 ASCCP recommendations) Repeat HPV based cytology in 1 year

## 2020-06-04 ENCOUNTER — Telehealth: Payer: Self-pay

## 2020-06-04 NOTE — Telephone Encounter (Signed)
Noted.  I look forward to seeing them today!  [name], MD [facility]

## 2020-06-04 NOTE — Telephone Encounter (Signed)
Spoke to pt. Patricia [name] to touch base to see how she was doing eating her peanuts and to let her know I had made up the first 3 doses of peanuts for [place]. Today is his last day of peanut powder. Mom stated Lucienne Minks is doing well with his milk oit.

## 2020-07-14 ENCOUNTER — Other Ambulatory Visit: Payer: Self-pay | Admitting: Gastroenterology

## 2020-07-14 DIAGNOSIS — R1011 Right upper quadrant pain: Secondary | ICD-10-CM

## 2020-07-14 DIAGNOSIS — R197 Diarrhea, unspecified: Secondary | ICD-10-CM

## 2020-07-15 ENCOUNTER — Ambulatory Visit (INDEPENDENT_AMBULATORY_CARE_PROVIDER_SITE_OTHER): Payer: 59 | Admitting: Adult Health

## 2020-07-15 ENCOUNTER — Encounter: Payer: Self-pay | Admitting: Adult Health

## 2020-07-15 VITALS — BP 147/98 | HR 73 | Ht 64.0 in | Wt 277.8 lb

## 2020-07-15 DIAGNOSIS — F329 Major depressive disorder, single episode, unspecified: Secondary | ICD-10-CM

## 2020-07-15 DIAGNOSIS — I10 Essential (primary) hypertension: Secondary | ICD-10-CM

## 2020-07-15 DIAGNOSIS — F32A Depression, unspecified: Secondary | ICD-10-CM

## 2020-07-15 MED ORDER — LOSARTAN POTASSIUM 50 MG PO TABS: 50.0000 mg | ORAL_TABLET | Freq: Every day | ORAL | 3 refills | Status: DC

## 2020-07-15 MED ORDER — SERTRALINE HCL 50 MG PO TABS: 50.0000 mg | ORAL_TABLET | Freq: Every day | ORAL | 6 refills | Status: DC

## 2020-07-15 NOTE — Progress Notes (Signed)
  Subjective:     Patient ID: Patricia [name], female   DOB: 15-Nov-1991, 29 y.o.   MRN: [id number]  HPI Patricia [name] is a 29 year old black female,married, G3P2012 back on follow up on starting Zoloft and is better, less depressed.  PCP is Dr Margo Aye.   Review of Systems Feels less depressed on Zoloft  Reviewed past medical,surgical, social and family history. Reviewed medications and allergies.     Objective:   Physical Exam BP (!) 147/98 (BP Location: Right Wrist, Patient Position: Sitting, Cuff Size: Normal)   Pulse 73   Ht 5\' 4"  (1.626 m)   Wt 277 lb 12.8 oz (126 kg)   LMP 07/09/2020   BMI 47.68 kg/m  Skin warm and dry.  Lungs: clear to ausculation bilaterally. Cardiovascular: regular rate and rhythm. PHQ 9 score is 3, was 18 on 05/20/20.    Upstream - 07/15/20 0834      Pregnancy Intention Screening   Does the patient want to become pregnant in the next year? No    Does the patient's partner want to become pregnant in the next year? N/A    Would the patient like to discuss contraceptive options today? No      Contraception Wrap Up   Current Method No Method - Other Reason    End Method No Method - Other Reason    Contraception Counseling Provided No             Assessment:     1. Depression, unspecified depression type Will continue Zoloft at 50 mg  2. Hypertension, unspecified type Will rx losartan and try DASH diet,handout given  Decrease salt and sugars and try to be more active   Meds ordered this encounter  Medications  . sertraline (ZOLOFT) 50 MG tablet    Sig: Take 1 tablet (50 mg total) by mouth daily.    Dispense:  30 tablet    Refill:  6    Order Specific Question:   Supervising Provider    Answer:   07/17/20, LUTHER H [[id number]]  . losartan (COZAAR) 50 MG tablet    Sig: Take 1 tablet (50 mg total) by mouth daily.    Dispense:  30 tablet    Refill:  3    Order Specific Question:   Supervising Provider    Answer:   Despina Hidden [[id number]]      Plan:     Has  appointment next week with Dr Lazaro Arms and labs  Follow up in 3 months

## 2020-07-15 NOTE — Patient Instructions (Signed)
DASH Eating Plan DASH stands for "Dietary Approaches to Stop Hypertension." The DASH eating plan is a healthy eating plan that has been shown to reduce high blood pressure (hypertension). It may also reduce your risk for type 2 diabetes, heart disease, and stroke. The DASH eating plan may also help with weight loss. What are tips for following this plan?  General guidelines  Avoid eating more than 2,300 mg (milligrams) of salt (sodium) a day. If you have hypertension, you may need to reduce your sodium intake to 1,500 mg a day.  Limit alcohol intake to no more than 1 drink a day for nonpregnant women and 2 drinks a day for men. One drink equals 12 oz of beer, 5 oz of wine, or 1 oz of hard liquor.  Work with your health care provider to maintain a healthy body weight or to lose weight. Ask what an ideal weight is for you.  Get at least 30 minutes of exercise that causes your heart to beat faster (aerobic exercise) most days of the week. Activities may include walking, swimming, or biking.  Work with your health care provider or diet and nutrition specialist (dietitian) to adjust your eating plan to your individual calorie needs. Reading food labels   Check food labels for the amount of sodium per serving. Choose foods with less than 5 percent of the Daily Value of sodium. Generally, foods with less than 300 mg of sodium per serving fit into this eating plan.  To find whole grains, look for the word "whole" as the first word in the ingredient list. Shopping  Buy products labeled as "low-sodium" or "no salt added."  Buy fresh foods. Avoid canned foods and premade or frozen meals. Cooking  Avoid adding salt when cooking. Use salt-free seasonings or herbs instead of table salt or sea salt. Check with your health care provider or pharmacist before using salt substitutes.  Do not fry foods. Cook foods using healthy methods such as baking, boiling, grilling, and broiling instead.  Cook with  heart-healthy oils, such as olive, canola, soybean, or sunflower oil. Meal planning  Eat a balanced diet that includes: ? 5 or more servings of fruits and vegetables each day. At each meal, try to fill half of your plate with fruits and vegetables. ? Up to 6-8 servings of whole grains each day. ? Less than 6 oz of lean meat, poultry, or fish each day. A 3-oz serving of meat is about the same size as a deck of cards. One egg equals 1 oz. ? 2 servings of low-fat dairy each day. ? A serving of nuts, seeds, or beans 5 times each week. ? Heart-healthy fats. Healthy fats called Omega-3 fatty acids are found in foods such as flaxseeds and coldwater fish, like sardines, salmon, and mackerel.  Limit how much you eat of the following: ? Canned or prepackaged foods. ? Food that is high in trans fat, such as fried foods. ? Food that is high in saturated fat, such as fatty meat. ? Sweets, desserts, sugary drinks, and other foods with added sugar. ? Full-fat dairy products.  Do not salt foods before eating.  Try to eat at least 2 vegetarian meals each week.  Eat more home-cooked food and less restaurant, buffet, and fast food.  When eating at a restaurant, ask that your food be prepared with less salt or no salt, if possible. What foods are recommended? The items listed may not be a complete list. Talk with your dietitian about   what dietary choices are best for you. Grains Whole-grain or whole-wheat bread. Whole-grain or whole-wheat pasta. Brown rice. Oatmeal. Quinoa. Bulgur. Whole-grain and low-sodium cereals. Pita bread. Low-fat, low-sodium crackers. Whole-wheat flour tortillas. Vegetables Fresh or frozen vegetables (raw, steamed, roasted, or grilled). Low-sodium or reduced-sodium tomato and vegetable juice. Low-sodium or reduced-sodium tomato sauce and tomato paste. Low-sodium or reduced-sodium canned vegetables. Fruits All fresh, dried, or frozen fruit. Canned fruit in natural juice (without  added sugar). Meat and other protein foods Skinless chicken or turkey. Ground chicken or turkey. Pork with fat trimmed off. Fish and seafood. Egg whites. Dried beans, peas, or lentils. Unsalted nuts, nut butters, and seeds. Unsalted canned beans. Lean cuts of beef with fat trimmed off. Low-sodium, lean deli meat. Dairy Low-fat (1%) or fat-free (skim) milk. Fat-free, low-fat, or reduced-fat cheeses. Nonfat, low-sodium ricotta or cottage cheese. Low-fat or nonfat yogurt. Low-fat, low-sodium cheese. Fats and oils Soft margarine without trans fats. Vegetable oil. Low-fat, reduced-fat, or light mayonnaise and salad dressings (reduced-sodium). Canola, safflower, olive, soybean, and sunflower oils. Avocado. Seasoning and other foods Herbs. Spices. Seasoning mixes without salt. Unsalted popcorn and pretzels. Fat-free sweets. What foods are not recommended? The items listed may not be a complete list. Talk with your dietitian about what dietary choices are best for you. Grains Baked goods made with fat, such as croissants, muffins, or some breads. Dry pasta or rice meal packs. Vegetables Creamed or fried vegetables. Vegetables in a cheese sauce. Regular canned vegetables (not low-sodium or reduced-sodium). Regular canned tomato sauce and paste (not low-sodium or reduced-sodium). Regular tomato and vegetable juice (not low-sodium or reduced-sodium). Pickles. Olives. Fruits Canned fruit in a light or heavy syrup. Fried fruit. Fruit in cream or butter sauce. Meat and other protein foods Fatty cuts of meat. Ribs. Fried meat. Bacon. Sausage. Bologna and other processed lunch meats. Salami. Fatback. Hotdogs. Bratwurst. Salted nuts and seeds. Canned beans with added salt. Canned or smoked fish. Whole eggs or egg yolks. Chicken or turkey with skin. Dairy Whole or 2% milk, cream, and half-and-half. Whole or full-fat cream cheese. Whole-fat or sweetened yogurt. Full-fat cheese. Nondairy creamers. Whipped toppings.  Processed cheese and cheese spreads. Fats and oils Butter. Stick margarine. Lard. Shortening. Ghee. Bacon fat. Tropical oils, such as coconut, palm kernel, or palm oil. Seasoning and other foods Salted popcorn and pretzels. Onion salt, garlic salt, seasoned salt, table salt, and sea salt. Worcestershire sauce. Tartar sauce. Barbecue sauce. Teriyaki sauce. Soy sauce, including reduced-sodium. Steak sauce. Canned and packaged gravies. Fish sauce. Oyster sauce. Cocktail sauce. Horseradish that you find on the shelf. Ketchup. Mustard. Meat flavorings and tenderizers. Bouillon cubes. Hot sauce and Tabasco sauce. Premade or packaged marinades. Premade or packaged taco seasonings. Relishes. Regular salad dressings. Where to find more information:  National Heart, Lung, and Blood Institute: www.nhlbi.nih.gov  American Heart Association: www.heart.org Summary  The DASH eating plan is a healthy eating plan that has been shown to reduce high blood pressure (hypertension). It may also reduce your risk for type 2 diabetes, heart disease, and stroke.  With the DASH eating plan, you should limit salt (sodium) intake to 2,300 mg a day. If you have hypertension, you may need to reduce your sodium intake to 1,500 mg a day.  When on the DASH eating plan, aim to eat more fresh fruits and vegetables, whole grains, lean proteins, low-fat dairy, and heart-healthy fats.  Work with your health care provider or diet and nutrition specialist (dietitian) to adjust your eating plan to your   individual calorie needs. This information is not intended to replace advice given to you by your health care provider. Make sure you discuss any questions you have with your health care provider. Document Revised: 10/21/2017 Document Reviewed: 11/01/2016 Elsevier Patient Education  2020 Elsevier Inc.  

## 2020-08-13 ENCOUNTER — Other Ambulatory Visit: Payer: Self-pay

## 2020-08-13 ENCOUNTER — Ambulatory Visit
Admission: EM | Admit: 2020-08-13 | Discharge: 2020-08-13 | Disposition: A | Discharge status: Home / Self Care | Payer: 59 | Attending: Emergency Medicine | Admitting: Emergency Medicine

## 2020-08-13 ENCOUNTER — Encounter: Payer: Self-pay | Admitting: Emergency Medicine

## 2020-08-13 DIAGNOSIS — Z20822 Contact with and (suspected) exposure to covid-19: Secondary | ICD-10-CM

## 2020-08-13 DIAGNOSIS — R059 Cough, unspecified: Secondary | ICD-10-CM

## 2020-08-13 DIAGNOSIS — R05 Cough: Secondary | ICD-10-CM | POA: Diagnosis not present

## 2020-08-13 DIAGNOSIS — J069 Acute upper respiratory infection, unspecified: Secondary | ICD-10-CM

## 2020-08-13 MED ORDER — BENZONATATE 100 MG PO CAPS: 100.0000 mg | ORAL_CAPSULE | Freq: Three times a day (TID) | ORAL | 0 refills | Status: DC

## 2020-08-13 MED ORDER — FLUTICASONE PROPIONATE 50 MCG/ACT NA SUSP: 2.0000 | Freq: Every day | NASAL | 0 refills | Status: DC

## 2020-08-13 NOTE — Discharge Instructions (Signed)
COVID testing ordered.  It will take between 5-7 days for test results.  Someone will contact you regarding abnormal results.    In the meantime: You should remain isolated in your home for 10 days from symptom onset AND greater than 72 hours after symptoms resolution (absence of fever without the use of fever-reducing medication and improvement in respiratory symptoms), whichever is longer Get plenty of rest and push fluids Tessalon Perles prescribed for cough Continue with zyrtec for nasal congestion, runny nose, and/or sore throat Flonase for nasal congestion and runny nose Use medications daily for symptom relief Use OTC medications like ibuprofen or tylenol as needed fever or pain Call or go to the ED if you have any new or worsening symptoms such as fever, worsening cough, shortness of breath, chest tightness, chest pain, turning blue, changes in mental status, etc..Marland Kitchen

## 2020-08-13 NOTE — ED Provider Notes (Signed)
[facility]   [id number] 08/13/20 Arrival Time: 1528   CC: COVID symptoms  SUBJECTIVE: History from: patient.  Patricia [name] is a 29 y.o. female who presents with cough, runny nose, scratchy throat x 1 day.  Children with similar symptoms.  Denies known COVID exposure.  Has tried OTC medications without relief.  Denies aggravating factors.  Denies previous COVID infection in the past.   Denies fever, chills, SOB, wheezing, chest pain, nausea, vomiting, changes in bowel or bladder habits.     ROS: As per HPI.  All other pertinent ROS negative.     Past Medical History:  Diagnosis Date  . Abnormal Papanicolaou smear of cervix with positive human papilloma virus (HPV) test 05/28/2020   Pap +HPV other and [facility], will need colpo per ASCCP guidelines,immediate  risk for CIN 3+ 12.6%   . Asthma   . Cough 08/01/2015  . Fatty liver   . Migraine   . Migraines   . Nausea and vomiting during pregnancy prior to [redacted] weeks gestation 08/01/2015  . PCO (polycystic ovaries) 03/08/2014  . Polycystic disease, ovaries   . Pregnancy induced hypertension   . Pregnant 05/27/2015  . URI (upper respiratory infection) 08/01/2015   Past Surgical History:  Procedure Laterality Date  . CHOLECYSTECTOMY N/A 06/08/2017   Procedure: LAPAROSCOPIC CHOLECYSTECTOMY;  Surgeon: Franky Macho, MD;  Location: [facility];  Service: General;  Laterality: N/A;  . TONSILLECTOMY    . WISDOM TOOTH EXTRACTION     Allergies  Allergen Reactions  . Peanut-Containing Drug Products Anaphylaxis and Hives  . Propranolol Shortness Of Breath  . Cephalexin Hives  . Imitrex [Sumatriptan] Hives   No current facility-administered medications on file prior to encounter.   Current Outpatient Medications on File Prior to Encounter  Medication Sig Dispense Refill  . albuterol (PROVENTIL HFA;VENTOLIN HFA) 108 (90 Base) MCG/ACT inhaler Inhale 2 puffs into the lungs every 4 (four) hours as needed for wheezing or shortness of breath. 1  Inhaler 3  . Biotin 5000 MCG CAPS Take 1 capsule by mouth daily.    . cetirizine (ZYRTEC) 10 MG tablet Take 10 mg by mouth daily.    . cholestyramine (QUESTRAN) 4 g packet MIX 1 PACKET AS DIRECTED AND DRINK BY MOUTH TWICE DAILY WITH MEALS 60 each 3  . clobetasol ointment (TEMOVATE) 0.05 % Apply 1 application topically 2 (two) times daily. 30 g 0  . DUPIXENT 300 MG/2ML SOPN     . losartan (COZAAR) 50 MG tablet Take 1 tablet (50 mg total) by mouth daily. 30 tablet 3  . metFORMIN (GLUCOPHAGE-XR) 750 MG 24 hr tablet TAKE 1 TABLET BY MOUTH  TWICE DAILY    . PREVIDENT 5000 BOOSTER PLUS 1.1 % PSTE Apply topically daily.   1  . sertraline (ZOLOFT) 50 MG tablet Take 1 tablet (50 mg total) by mouth daily. 30 tablet 6  . VYVANSE 40 MG capsule Take 40 mg by mouth daily.     Social History   Socioeconomic History  . Marital status: Married    Spouse name: Not on file  . Number of children: Not on file  . Years of education: Not on file  . Highest education level: Not on file  Occupational History  . Not on file  Tobacco Use  . Smoking status: Never Smoker  . Smokeless tobacco: Never Used  Vaping Use  . Vaping Use: Never used  Substance and Sexual Activity  . Alcohol use: No    Comment: occassional  .  Drug use: No  . Sexual activity: Not Currently    Birth control/protection: None  Other Topics Concern  . Not on file  Social History Narrative  . Not on file   Social Determinants of Health   Financial Resource Strain: Low Risk   . Difficulty of Paying Living Expenses: Not hard at all  Food Insecurity: No Food Insecurity  . Worried About Programme researcher, broadcasting/film/video in the Last Year: Never true  . Ran Out of Food in the Last Year: Never true  Transportation Needs: No Transportation Needs  . Lack of Transportation (Medical): No  . Lack of Transportation (Non-Medical): No  Physical Activity: Sufficiently Active  . Days of Exercise per Week: 5 days  . Minutes of Exercise per Session: 30 min    Stress: Stress Concern Present  . Feeling of Stress : Very much  Social Connections: Moderately Integrated  . Frequency of Communication with Friends and Family: More than three times a week  . Frequency of Social Gatherings with Friends and Family: Once a week  . Attends Religious Services: More than 4 times per year  . Active Member of Clubs or Organizations: No  . Attends Banker Meetings: Never  . Marital Status: Married  Catering manager Violence: Not At Risk  . Fear of Current or Ex-Partner: No  . Emotionally Abused: No  . Physically Abused: No  . Sexually Abused: No   Family History  Problem Relation Age of Onset  . Migraines Sister   . Hypertension Brother   . Heart disease Maternal Grandmother   . Cancer Maternal Grandfather        prostate  . Stroke Paternal Grandmother   . Cancer Paternal Grandfather        prostate  . Stroke Paternal Grandfather   . Atrial fibrillation Father   . Colon cancer Neg Hx   . Allergic rhinitis Neg Hx   . Angioedema Neg Hx   . Asthma Neg Hx   . Atopy Neg Hx   . Eczema Neg Hx   . Urticaria Neg Hx   . Immunodeficiency Neg Hx     OBJECTIVE:  Vitals:   08/13/20 1555  BP: 136/85  Pulse: 93  Resp: 17  Temp: 98.9 F (37.2 C)  TempSrc: Oral  SpO2: 96%     General appearance: alert; mildly fatigued appearing, nontoxic; speaking in full sentences and tolerating own secretions HEENT: NCAT; Ears: EACs clear, TMs pearly gray; Eyes: PERRL.  EOM grossly intact.Nose: nares patent with clear rhinorrhea, Throat: oropharynx clear, tonsils non erythematous or enlarged, uvula midline  Neck: supple without LAD Lungs: unlabored respirations, symmetrical air entry; cough: absent; no respiratory distress; CTAB Heart: regular rate and rhythm.  Skin: warm and dry Psychological: alert and cooperative; normal mood and affect   ASSESSMENT & PLAN:  1. Cough   2. Viral URI with cough   3. Suspected COVID-19 virus infection      Meds ordered this encounter  Medications  . benzonatate (TESSALON) 100 MG capsule    Sig: Take 1 capsule (100 mg total) by mouth every 8 (eight) hours.    Dispense:  21 capsule    Refill:  0    Order Specific Question:   Supervising Provider    Answer:   Eustace Moore [[id number]]  . fluticasone (FLONASE) 50 MCG/ACT nasal spray    Sig: Place 2 sprays into both nostrils daily.    Dispense:  16 g    Refill:  0  Order Specific Question:   Supervising Provider    Answer:   Eustace Moore [[id number]]   COVID testing ordered.  It will take between 5-7 days for test results.  Someone will contact you regarding abnormal results.    In the meantime: You should remain isolated in your home for 10 days from symptom onset AND greater than 72 hours after symptoms resolution (absence of fever without the use of fever-reducing medication and improvement in respiratory symptoms), whichever is longer Get plenty of rest and push fluids Tessalon Perles prescribed for cough Continue with zyrtec for nasal congestion, runny nose, and/or sore throat Flonase for nasal congestion and runny nose Use medications daily for symptom relief Use OTC medications like ibuprofen or tylenol as needed fever or pain Call or go to the ED if you have any new or worsening symptoms such as fever, worsening cough, shortness of breath, chest tightness, chest pain, turning blue, changes in mental status, etc...   Reviewed expectations re: course of current medical issues. Questions answered. Outlined signs and symptoms indicating need for more acute intervention. Patient verbalized understanding. After Visit Summary given.         Rennis Harding, PA-C 08/13/20 1633

## 2020-08-13 NOTE — ED Triage Notes (Signed)
Scratchy throat and cough

## 2020-08-15 LAB — SARS-COV-2, NAA 2 DAY TAT

## 2020-08-15 LAB — NOVEL CORONAVIRUS, NAA: SARS-CoV-2, NAA: NOT DETECTED

## 2020-08-23 ENCOUNTER — Other Ambulatory Visit: Payer: Self-pay

## 2020-08-23 ENCOUNTER — Ambulatory Visit
Admission: EM | Admit: 2020-08-23 | Discharge: 2020-08-23 | Disposition: A | Discharge status: Other Acute Inpatient Hospital | Payer: 59

## 2020-08-23 ENCOUNTER — Emergency Department (HOSPITAL_COMMUNITY)
Admission: EM | Admit: 2020-08-23 | Discharge: 2020-08-23 | Disposition: A | Discharge status: Home / Self Care | Payer: 59 | Attending: Emergency Medicine | Admitting: Emergency Medicine

## 2020-08-23 ENCOUNTER — Encounter (HOSPITAL_COMMUNITY): Payer: Self-pay

## 2020-08-23 ENCOUNTER — Emergency Department (HOSPITAL_COMMUNITY): Payer: 59

## 2020-08-23 DIAGNOSIS — I10 Essential (primary) hypertension: Secondary | ICD-10-CM | POA: Diagnosis not present

## 2020-08-23 DIAGNOSIS — Z79899 Other long term (current) drug therapy: Secondary | ICD-10-CM | POA: Diagnosis not present

## 2020-08-23 DIAGNOSIS — J45909 Unspecified asthma, uncomplicated: Secondary | ICD-10-CM | POA: Diagnosis not present

## 2020-08-23 DIAGNOSIS — M79604 Pain in right leg: Secondary | ICD-10-CM

## 2020-08-23 DIAGNOSIS — M79661 Pain in right lower leg: Secondary | ICD-10-CM | POA: Diagnosis present

## 2020-08-23 NOTE — ED Triage Notes (Signed)
Pt spoken to by provider and advised to go to ED for ultrasound

## 2020-08-23 NOTE — ED Provider Notes (Signed)
[facility] EMERGENCY DEPARTMENT Provider Note   CSN: [id number] Arrival date & time: 08/23/20  0846     History Chief Complaint  Patient presents with   Leg Pain    Patricia [name] is a 29 y.o. female.    Patient complains of right calf pain.  She was instructed by her doctor to come to the emergency department to rule out a DVT.  The history is provided by the patient and medical records. No language interpreter was used.  Leg Pain Lower extremity pain location: Right lower leg. Pain details:    Quality:  Aching   Radiates to:  Does not radiate   Severity:  Mild   Onset quality:  Sudden   Timing:  Constant   Progression:  Waxing and waning Chronicity:  New Dislocation: no   Associated symptoms: no back pain and no fatigue        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 05/28/2020   Pap +HPV other and [facility], will need colpo per ASCCP guidelines,immediate  risk for CIN 3+ 12.6%    Asthma    Cough 08/01/2015   Fatty liver    Migraine    Migraines    Nausea and vomiting during pregnancy prior to [redacted] weeks gestation 08/01/2015   PCO (polycystic ovaries) 03/08/2014   Polycystic disease, ovaries    Pregnancy induced hypertension    Pregnant 05/27/2015   URI (upper respiratory infection) 08/01/2015    Patient Active Problem List   Diagnosis Date Noted   Hypertension 07/15/2020   Abnormal Papanicolaou smear of cervix with positive human papilloma virus (HPV) test 05/28/2020   Nexplanon removal 05/20/2020   Encounter for gynecological examination with Papanicolaou smear of cervix 05/20/2020   Depression 05/20/2020   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   Fatty liver 11/29/2019   Diarrhea 09/27/2019   Elevated LFTs 06/17/2017   Calculus of gallbladder with acute cholecystitis without obstruction    Nausea vomiting and diarrhea    RUQ abdominal  pain    Transaminitis    Biliary colic    Gall stones, common bile duct    Abdominal pain 06/04/2017   Chronic hypertension complicating or reason for care during childbirth 04/13/2017   Chronic hypertension in pregnancy 04/13/2017   Chronic hypertension affecting pregnancy 11/08/2016   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   Supervision of high risk pregnancy, antepartum 09/27/2016   History of gestational hypertension 09/27/2016   History of shoulder dystocia in prior pregnancy, currently pregnant 09/27/2016   URI (upper respiratory infection) 08/01/2015   Persistent cough 08/01/2015   Obesity, Class III, BMI 40-49.9 (morbid obesity) (HCC) 09/23/2014   Headache 09/23/2014   PCO (polycystic ovaries) 03/08/2014    Past Surgical History:  Procedure Laterality Date   CHOLECYSTECTOMY N/A 06/08/2017   Procedure: LAPAROSCOPIC CHOLECYSTECTOMY;  Surgeon: Franky Macho, MD;  Location: [facility];  Service: General;  Laterality: N/A;   TONSILLECTOMY     WISDOM TOOTH EXTRACTION       OB History    Gravida  3   Para  2   Term  2   Preterm      AB  1   Living  2     SAB  1   TAB      Ectopic      Multiple  0   Live Births  2  Family History  Problem Relation Age of Onset   Migraines Sister    Hypertension Brother    Heart disease Maternal Grandmother    Cancer Maternal Grandfather        prostate   Stroke Paternal Grandmother    Cancer Paternal Grandfather        prostate   Stroke Paternal Grandfather    Atrial fibrillation Father    Colon cancer Neg Hx    Allergic rhinitis Neg Hx    Angioedema Neg Hx    Asthma Neg Hx    Atopy Neg Hx    Eczema Neg Hx    Urticaria Neg Hx    Immunodeficiency Neg Hx     Social History   Tobacco Use   Smoking status: Never Smoker   Smokeless tobacco: Never Used  Vaping Use   Vaping Use: Never used  Substance Use Topics   Alcohol use: No    Comment:  occassional   Drug use: No    Home Medications Prior to Admission medications   Medication Sig Start Date End Date Taking? Authorizing Provider  albuterol (PROVENTIL HFA;VENTOLIN HFA) 108 (90 Base) MCG/ACT inhaler Inhale 2 puffs into the lungs every 4 (four) hours as needed for wheezing or shortness of breath. 03/08/19   Alfonse SpruceGallagher, Joel Louis, MD  benzonatate (TESSALON) 100 MG capsule Take 1 capsule (100 mg total) by mouth every 8 (eight) hours. 08/13/20   Wurst, [place], PA-C  Biotin 5000 MCG CAPS Take 1 capsule by mouth daily.    [provider]  cetirizine (ZYRTEC) 10 MG tablet Take 10 mg by mouth daily.    [provider]  cholestyramine (QUESTRAN) 4 g packet MIX 1 PACKET AS DIRECTED AND DRINK BY MOUTH TWICE DAILY WITH MEALS 07/16/20   Gelene MinkBoone, Anna W, NP  clobetasol ointment (TEMOVATE) 0.05 % Apply 1 application topically 2 (two) times daily. 01/16/20   Alfonse SpruceGallagher, Joel Louis, MD  DUPIXENT 300 MG/2ML [facility]  12/05/19   [provider]  fluticasone (FLONASE) 50 MCG/ACT nasal spray Place 2 sprays into both nostrils daily. 08/13/20   Wurst, [place], PA-C  losartan (COZAAR) 50 MG tablet Take 1 tablet (50 mg total) by mouth daily. 07/15/20   Adline PotterGriffin, Jennifer A, NP  metFORMIN (GLUCOPHAGE-XR) 750 MG 24 hr tablet TAKE 1 TABLET BY MOUTH  TWICE DAILY 03/06/20   [provider]  PREVIDENT 5000 BOOSTER PLUS 1.1 % PSTE Apply topically daily.  05/23/17   [provider]  sertraline (ZOLOFT) 50 MG tablet Take 1 tablet (50 mg total) by mouth daily. 07/15/20   Adline PotterGriffin, Jennifer A, NP  VYVANSE 40 MG capsule Take 40 mg by mouth daily. 01/30/20   [provider]    Allergies    Peanut-containing drug products, Propranolol, Cephalexin, and Imitrex [sumatriptan]  Review of Systems   Review of Systems  Constitutional: Negative for appetite change and fatigue.  HENT: Negative for congestion, ear discharge and sinus pressure.   Eyes: Negative for discharge.    Respiratory: Negative for cough.   Cardiovascular: Negative for chest pain.  Gastrointestinal: Negative for abdominal pain and diarrhea.  Genitourinary: Negative for frequency and hematuria.  Musculoskeletal: Negative for back pain.       Right lower leg pain  Skin: Negative for rash.  Neurological: Negative for seizures and headaches.  Psychiatric/Behavioral: Negative for hallucinations.    Physical Exam Updated Vital Signs BP 116/71    Pulse 73    Temp 98.5 F (36.9 C) (Oral)  Resp 17    Ht 5\' 4"  (1.626 m)    Wt 122.5 kg    SpO2 100%    BMI 46.35 kg/m   Physical Exam Vitals and nursing note reviewed.  Constitutional:      Appearance: She is well-developed.  HENT:     Head: Normocephalic.     Nose: Nose normal.  Eyes:     General: No scleral icterus.    Conjunctiva/sclera: Conjunctivae normal.  Neck:     Thyroid: No thyromegaly.  Cardiovascular:     Rate and Rhythm: Normal rate and regular rhythm.     Heart sounds: No murmur heard.  No friction rub. No gallop.   Pulmonary:     Breath sounds: No stridor. No wheezing or rales.  Chest:     Chest wall: No tenderness.  Abdominal:     General: There is no distension.     Tenderness: There is no abdominal tenderness. There is no rebound.  Musculoskeletal:        General: Normal range of motion.     Cervical back: Neck supple.     Comments: Mild tenderness to right calf and right popliteal fossa  Lymphadenopathy:     Cervical: No cervical adenopathy.  Skin:    Findings: No erythema or rash.  Neurological:     Mental Status: She is oriented to person, place, and time.     Motor: No abnormal muscle tone.     Coordination: Coordination normal.  Psychiatric:        Behavior: Behavior normal.     ED Results / Procedures / Treatments   Labs (all labs ordered are listed, but only abnormal results are displayed) Labs Reviewed - No data to display  EKG None  Radiology Venous Img Lower Unilateral  Right  Result Date: 08/23/2020 CLINICAL DATA:  Right lower extremity pain. EXAM: RIGHT LOWER EXTREMITY VENOUS DOPPLER ULTRASOUND TECHNIQUE: Gray-scale sonography with compression, as well as color and duplex ultrasound, were performed to evaluate the deep venous system(s) from the level of the common femoral vein through the popliteal and proximal calf veins. COMPARISON:  None. FINDINGS: VENOUS Normal compressibility of the common femoral, superficial femoral, and popliteal veins, as well as the visualized calf veins. Visualized portions of profunda femoral vein and great saphenous vein unremarkable. No filling defects to suggest DVT on grayscale or color Doppler imaging. Doppler waveforms show normal direction of venous flow, normal respiratory plasticity and response to augmentation. Limited views of the contralateral common femoral vein are unremarkable. OTHER None. Limitations: none IMPRESSION: Negative exam. Electronically Signed   By: 10/23/2020 M.D.   On: 08/23/2020 12:27    Procedures Procedures (including critical care time)  Medications Ordered in ED Medications - No data to display  ED Course  I have reviewed the triage vital signs and the nursing notes.  Pertinent labs & imaging results that were available during my care of the patient were reviewed by me and considered in my medical decision making (see chart for details).    MDM Rules/Calculators/A&P                          Patient with right lower leg pain.  Ultrasound was negative for DVT.  Suspect soft tissue injury strain.  Patient will take Tylenol for pain and follow-up with PCP if needed Final Clinical Impression(s) / ED Diagnoses Final diagnoses:  Right leg pain    Rx / DC Orders ED  Discharge Orders    None       Bethann Berkshire, MD 08/23/20 1328

## 2020-08-23 NOTE — ED Triage Notes (Signed)
Pt came from Urgent Care stating they believed she possibly had a blot clot to her right leg due to pain since yesterday. Pt able to ambulate. No swelling or warmth noted.

## 2020-08-23 NOTE — Discharge Instructions (Addendum)
Take Tylenol for pain and follow-up with your doctor if not improving

## 2020-09-21 ENCOUNTER — Other Ambulatory Visit: Payer: Self-pay | Admitting: Allergy & Immunology

## 2020-10-07 ENCOUNTER — Encounter: Payer: Self-pay | Admitting: Allergy & Immunology

## 2020-10-07 ENCOUNTER — Ambulatory Visit (INDEPENDENT_AMBULATORY_CARE_PROVIDER_SITE_OTHER): Payer: 59 | Admitting: Allergy & Immunology

## 2020-10-07 ENCOUNTER — Other Ambulatory Visit: Payer: Self-pay

## 2020-10-07 VITALS — BP 108/86 | HR 80 | Resp 18

## 2020-10-07 DIAGNOSIS — T7800XD Anaphylactic reaction due to unspecified food, subsequent encounter: Secondary | ICD-10-CM | POA: Diagnosis not present

## 2020-10-07 DIAGNOSIS — R079 Chest pain, unspecified: Secondary | ICD-10-CM

## 2020-10-07 DIAGNOSIS — J3089 Other allergic rhinitis: Secondary | ICD-10-CM | POA: Diagnosis not present

## 2020-10-07 DIAGNOSIS — R0602 Shortness of breath: Secondary | ICD-10-CM

## 2020-10-07 DIAGNOSIS — J4520 Mild intermittent asthma, uncomplicated: Secondary | ICD-10-CM | POA: Diagnosis not present

## 2020-10-07 DIAGNOSIS — J302 Other seasonal allergic rhinitis: Secondary | ICD-10-CM

## 2020-10-07 DIAGNOSIS — L2089 Other atopic dermatitis: Secondary | ICD-10-CM

## 2020-10-07 NOTE — Progress Notes (Signed)
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 SOB (shortness of breath) - We are going to get a D-dimer to see if there is any evidence of a pulmonary embolism. - I seriously doubt this, but since the albuterol is not working I am concerned. - I am going to start you on a prednisone burst to see if this provides any relief of your symptoms.   2. 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 - We are going to get peanut allergy levels to see where those are hanging out.   3. Seasonal and perennial allergic rhinitis - Continue with all of your allergy medications.   4. Flexural atopic dermatitis - Continue with Dupixent every two weeks. - Continue with moisturizing twice daily.   5. Follow up as scheduled.   Subjective:   Patricia [name] is a 29 y.o. female presenting today for follow up of  Chief Complaint  Patient presents with  . Asthma    possibly having minor asthma issues. cough ongoing x1 week. lungs feel sensitive and she feels very short of breath on exertion. inhalers are not working.     Patricia [name] has a history of the following: Patient Active Problem List   Diagnosis Date Noted  . Hypertension 07/15/2020  . 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 Nexplanon removal 05/20/2020  . Encounter for gynecological examination with Papanicolaou smear of cervix 05/20/2020  . Depression 05/20/2020  . 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 Fatty  liver 11/29/2019  . Diarrhea 09/27/2019  . Elevated LFTs 06/17/2017  . Calculus of gallbladder with acute cholecystitis without obstruction   . Nausea vomiting and diarrhea   . RUQ abdominal pain   . Transaminitis   . Biliary colic   . Gall stones, common bile duct   . Abdominal pain 06/04/2017  . 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 Chronic hypertension in pregnancy 04/13/2017  . Chronic hypertension affecting pregnancy 11/08/2016  . 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 Supervision of high risk pregnancy, antepartum 09/27/2016  . History of gestational hypertension 09/27/2016  . History of shoulder dystocia in prior pregnancy, currently pregnant 09/27/2016  . URI (upper respiratory infection) 08/01/2015  . Persistent cough 08/01/2015  . Obesity, Class III, BMI 40-49.9 (morbid obesity) (HCC) 09/23/2014  . Headache 09/23/2014  . PCO (polycystic ovaries) 03/08/2014    History obtained from: chart review and patient.  Patricia [name] is a 29 y.o. female presenting for a follow up visit.  She was last seen in May 2021 as a regular patient.  She does have a history of peanut allergy and is on oral immunotherapy.  We continued her on Dupixent at the last visit because it had made such an improvement with her skin.  Her asthma was well controlled without frequent albuterol use.  Her allergic rhinitis was controlled with a nasal steroid and antihistamine.  We saw her again in June 2021, when we changed her down to 8 peanuts  as a maintenance.   She was seen in the ED for right leg pain in October 2021. Se eventually received Toradol and something else which helped. Interestingly, she has this every October since Patricia [name] was born. She did have an ultrasound that was negative for a DVT.   In the interim, she has developed some shortness of breath. This has been ongoing for one week. She has had no fevers. Patricia [name] did have PNA one week ago and he is  doing much better. He was on antibiotics and he has two more days of. He has been keeping up with his OIT.   This is worse when she lays down. She will take up with this cough. She is not vaccinated against COVID19 at this point in time. She has not had any sick contacts at all, aside from her son who was diagnosed with pneumonia last week. He is now s/p antibiotics and feeling much better. However, I did confirm again that this is not responsive to albuterol at all.   Otherwise, there have been no changes to her past medical history, surgical history, family history, or social history.    Review of Systems  Constitutional: Negative.  Negative for fever, malaise/fatigue and weight loss.  HENT: Negative.  Negative for congestion, ear discharge and ear pain.   Eyes: Negative for pain, discharge and redness.  Respiratory: Positive for cough and shortness of breath. Negative for sputum production and wheezing.   Cardiovascular: Negative.  Negative for chest pain and palpitations.  Gastrointestinal: Negative for abdominal pain and heartburn.  Skin: Negative.  Negative for itching and rash.  Neurological: Negative for dizziness and headaches.  Endo/Heme/Allergies: Negative for environmental allergies. Does not bruise/bleed easily.       Objective:   Blood pressure 110/70, pulse 82, resp. rate 18. There is no height or weight on file to calculate BMI.   Physical Exam:  Physical Exam Vitals reviewed.  Constitutional:      Appearance: She is well-developed.     Comments: Pleasant female. Cooperative with the exam. Very pleasant.   HENT:     Head: Normocephalic and atraumatic.     Right Ear: Tympanic membrane, ear canal and external ear normal.     Left Ear: Tympanic membrane, ear canal and external ear normal.     Nose: No nasal deformity, septal deviation, mucosal edema or rhinorrhea.     Right Turbinates: Enlarged and swollen.     Left Turbinates: Enlarged and swollen.     Right  Sinus: No maxillary sinus tenderness or frontal sinus tenderness.     Left Sinus: No maxillary sinus tenderness or frontal sinus tenderness.     Comments: No polyps appreciated.     Mouth/Throat:     Mouth: Mucous membranes are not pale and not dry.     Pharynx: Uvula midline.  Eyes:     General:        Right eye: No discharge.        Left eye: No discharge.     Conjunctiva/sclera: Conjunctivae normal.     Right eye: Right conjunctiva is not injected. No chemosis.    Left eye: Left conjunctiva is not injected. No chemosis.    Pupils: Pupils are equal, round, and reactive to light.  Cardiovascular:     Rate and Rhythm: Normal rate and regular rhythm.     Heart sounds: Normal heart sounds.  Pulmonary:     Effort: Pulmonary effort is normal. No tachypnea, accessory muscle usage or  respiratory distress.     Breath sounds: Normal breath sounds. No wheezing, rhonchi or rales.     Comments: Moving air well in all lung fields. No increased work of breathing noted.  Chest:     Chest wall: No tenderness.  Lymphadenopathy:     Cervical: No cervical adenopathy.  Skin:    Coloration: Skin is not pale.     Findings: No abrasion, erythema, petechiae or rash. Rash is not papular, urticarial or vesicular.  Neurological:     Mental Status: She is alert.  Psychiatric:        Behavior: Behavior is cooperative.      Diagnostic studies:    Spirometry: results normal (FEV1: 2.54/92%, FVC: 3.28/102%, FEV1/FVC: 77%).    Spirometry consistent with normal pattern.   Allergy Studies: none  We did send labs for a D-dimer to screen for a pulmonary embolism. We also sent peanut components to see where these are hanging out in the middle of her OIT.       [name], MD  [facility]

## 2020-10-07 NOTE — Patient Instructions (Addendum)
1. SOB (shortness of breath) - We are going to get a D-dimer to see if there is any evidence of a pulmonary embolism. - I seriously doubt this, but since the albuterol is not working I am concerned. - I am going to start you on a prednisone burst to see if this provides any relief of your symptoms.   2. 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 - We are going to get peanut allergy levels to see where those are hanging out.   3. Seasonal and perennial allergic rhinitis - Continue with all of your allergy medications.   4. Flexural atopic dermatitis - Continue with Dupixent every two weeks. - Continue with moisturizing twice daily.   5. Follow up as scheduled.    Please inform us of any Emergency Department visits, hospitalizations, or changes in symptoms. Call us before going to the ED for breathing or allergy symptoms since we might be able to fit you in for a sick visit. Feel free to contact us anytime with any questions, problems, or concerns.  It was a pleasure to see you again today!  Websites that have reliable patient information: 1. American Academy of Asthma, Allergy, and Immunology: www.aaaai.org 2. Food Allergy Research and Education (FARE): foodallergy.org 3. Mothers of Asthmatics: http://www.asthmacommunitynetwork.org 4. American College of Allergy, Asthma, and Immunology: www.acaai.org   COVID-19 Vaccine Information can be found at: PodExchange.nl For questions related to vaccine distribution or appointments, please email vaccine@[email or web address] or call [phone].     "Like" [place] on Facebook and Instagram for our latest updates!     HAPPY FALL!     Make sure you are registered to vote! If you have moved or changed any of your contact information, you will need to get this updated before voting!  In some cases, you MAY be able to register to vote online:  [email or web address]

## 2020-10-08 ENCOUNTER — Emergency Department (HOSPITAL_COMMUNITY): Payer: 59

## 2020-10-08 ENCOUNTER — Emergency Department (HOSPITAL_COMMUNITY)
Admission: EM | Admit: 2020-10-08 | Discharge: 2020-10-09 | Disposition: A | Discharge status: Home / Self Care | Payer: 59 | Attending: Emergency Medicine | Admitting: Emergency Medicine

## 2020-10-08 ENCOUNTER — Encounter: Payer: Self-pay | Admitting: Allergy & Immunology

## 2020-10-08 ENCOUNTER — Other Ambulatory Visit: Payer: Self-pay

## 2020-10-08 DIAGNOSIS — I10 Essential (primary) hypertension: Secondary | ICD-10-CM | POA: Insufficient documentation

## 2020-10-08 DIAGNOSIS — J45909 Unspecified asthma, uncomplicated: Secondary | ICD-10-CM | POA: Diagnosis not present

## 2020-10-08 DIAGNOSIS — Z20822 Contact with and (suspected) exposure to covid-19: Secondary | ICD-10-CM | POA: Insufficient documentation

## 2020-10-08 DIAGNOSIS — R0602 Shortness of breath: Secondary | ICD-10-CM | POA: Insufficient documentation

## 2020-10-08 DIAGNOSIS — R059 Cough, unspecified: Secondary | ICD-10-CM | POA: Insufficient documentation

## 2020-10-08 DIAGNOSIS — R0600 Dyspnea, unspecified: Secondary | ICD-10-CM

## 2020-10-08 DIAGNOSIS — Z91010 Allergy to peanuts: Secondary | ICD-10-CM | POA: Insufficient documentation

## 2020-10-08 LAB — RESPIRATORY PANEL BY RT PCR (FLU A&B, COVID)
Influenza A by PCR: NEGATIVE
Influenza B by PCR: NEGATIVE
SARS Coronavirus 2 by RT PCR: NEGATIVE

## 2020-10-08 LAB — BASIC METABOLIC PANEL
Anion gap: 7 (ref 5–15)
BUN: 13 mg/dL (ref 6–20)
CO2: 24 mmol/L (ref 22–32)
Calcium: 9 mg/dL (ref 8.9–10.3)
Chloride: 102 mmol/L (ref 98–111)
Creatinine, Ser: 0.51 mg/dL (ref 0.44–1.00)
GFR, Estimated: >60 mL/min (ref >60)
Glucose, Bld: 105 mg/dL — ABNORMAL HIGH (ref 70–99)
Potassium: 4.1 mmol/L (ref 3.5–5.1)
Sodium: 133 mmol/L — ABNORMAL LOW (ref 135–145)

## 2020-10-08 LAB — CBC
HCT: 36.4 % (ref 36.0–46.0)
Hemoglobin: 11.5 g/dL — ABNORMAL LOW (ref 12.0–15.0)
MCH: 26.4 pg (ref 26.0–34.0)
MCHC: 31.6 g/dL (ref 30.0–36.0)
MCV: 83.5 fL (ref 80.0–100.0)
Platelets: 374 10*3/uL (ref 150–400)
RBC: 4.36 MIL/uL (ref 3.87–5.11)
RDW: 14.3 % (ref 11.5–15.5)
WBC: 11.6 10*3/uL — ABNORMAL HIGH (ref 4.0–10.5)
nRBC: 0 % (ref 0.0–0.2)

## 2020-10-08 LAB — D-DIMER, QUANTITATIVE: D-DIMER: 0.67 mg/L FEU — ABNORMAL HIGH (ref 0.00–0.49)

## 2020-10-08 LAB — HCG, SERUM, QUALITATIVE: Preg, Serum: NEGATIVE

## 2020-10-08 MED ORDER — IOHEXOL 350 MG/ML SOLN
100.0000 mL | Freq: Once | INTRAVENOUS | Status: AC | PRN
  Administered 2020-10-08: 100 mL via INTRAVENOUS

## 2020-10-08 NOTE — ED Triage Notes (Signed)
Pt c/o of shortness of breath for the last week. Pt had an elevated d-dimer yesterday. Patient has used her albuterol inhaler without relief.

## 2020-10-08 NOTE — ED Provider Notes (Signed)
Medical screening examination/treatment/procedure(s) were conducted as a shared visit with non-physician practitioner(s) and myself.  I personally evaluated the patient during the encounter.  In from allergist office 2/2 elevated d dimer. Pending ct scan.   CT read as negative per radiology. I can't view it personally because of IT issues. Patient appears comfortable. VSS. No distress. I discussed negative study, continuing MDI and steroids with allergist follow up appears stable for discharge at this time.       EKG Interpretation  Date/Time:  Wednesday October 08 2020 17:07:49 EST Ventricular Rate:  77 PR Interval:  194 QRS Duration: 82 QT Interval:  378 QTC Calculation: 427 R Axis:   71 Text Interpretation: Normal sinus rhythm Nonspecific ST abnormality Confirmed by Cathren Laine ([id number]) on 10/08/2020 6:02:50 PM     Alya Smaltz, Barbara Cower, MD 10/09/20 [id number]

## 2020-10-08 NOTE — Addendum Note (Signed)
Addended by: Mliss Fritz I on: 10/08/2020 04:25 PM   Modules accepted: Orders

## 2020-10-08 NOTE — ED Provider Notes (Signed)
[facility] EMERGENCY DEPARTMENT Provider Note   CSN: [id number] Arrival date & time: 10/08/20  1638     History Chief Complaint  Patient presents with  . Shortness of Breath    Patricia [name] is a 29 y.o. female.  HPI      Patricia [name] is a 29 y.o. female with past medical history of asthma, PCOS who presents to the Emergency Department requesting evaluation for a possible PE.  She reports persistent shortness of breath for 1 week and associated cough.  Describes cough as nonproductive and worsens with speaking or lying supine.  She denies having shortness of breath with exertion.  No chest pain or fevers.  She has some wheezing associated with cough, but wheezing is intermittent.  She has been using her albuterol MDI at home without relief.  She was seen by her allergist earlier today, she has been taking prednisone since yesterday.  This is also not provided any relief.  She states that her allergist performed a D-dimer which was elevated and she was advised to come to the emergency department for further evaluation.  She denies history of clotting disorders, prior PE/DVT.  No recent travel.  She had an Nexplanon implant removed in June of this year.  She is not currently on any birth control.  She states that her son was recently diagnosed with pneumonia, but denies other sick contacts.  No known Covid exposures.  She has not been vaccinated for the Covid virus.   Past Medical History:  Diagnosis Date  . Abnormal Papanicolaou smear of cervix with positive human papilloma virus (HPV) test 05/28/2020   Pap +HPV other and [facility], will need colpo per ASCCP guidelines,immediate  risk for CIN 3+ 12.6%   . Asthma   . Cough 08/01/2015  . Fatty liver   . Migraine   . Migraines   . Nausea and vomiting during pregnancy prior to [redacted] weeks gestation 08/01/2015  . PCO (polycystic ovaries) 03/08/2014  . Polycystic disease, ovaries   . Pregnancy induced hypertension   . Pregnant 05/27/2015  . URI  (upper respiratory infection) 08/01/2015    Patient Active Problem List   Diagnosis Date Noted  . Hypertension 07/15/2020  . 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 Nexplanon removal 05/20/2020  . Encounter for gynecological examination with Papanicolaou smear of cervix 05/20/2020  . Depression 05/20/2020  . 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 Fatty liver 11/29/2019  . Diarrhea 09/27/2019  . Elevated LFTs 06/17/2017  . Calculus of gallbladder with acute cholecystitis without obstruction   . Nausea vomiting and diarrhea   . RUQ abdominal pain   . Transaminitis   . Biliary colic   . Gall stones, common bile duct   . Abdominal pain 06/04/2017  . 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 Chronic hypertension in pregnancy 04/13/2017  . Chronic hypertension affecting pregnancy 11/08/2016  . 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 Supervision of high risk pregnancy, antepartum 09/27/2016  . History of gestational hypertension 09/27/2016  . History of shoulder dystocia in prior pregnancy, currently pregnant 09/27/2016  . URI (upper respiratory infection) 08/01/2015  . Persistent cough 08/01/2015  . Obesity, Class III, BMI 40-49.9 (morbid obesity) (HCC) 09/23/2014  . Headache 09/23/2014  . PCO (polycystic ovaries) 03/08/2014    Past Surgical History:  Procedure Laterality Date  . CHOLECYSTECTOMY N/A 06/08/2017   Procedure: LAPAROSCOPIC CHOLECYSTECTOMY;  Surgeon: Franky MachoJenkins, Mark, MD;  Location: [facility];  Service: General;  Laterality: N/A;  . TONSILLECTOMY    . WISDOM TOOTH EXTRACTION       OB History    Gravida  3   Para  2   Term  2   Preterm      AB  1   Living  2     SAB  1   TAB      Ectopic      Multiple  0   Live Births  2           Family History  Problem Relation Age of Onset  . Migraines Sister   .  Hypertension Brother   . Heart disease Maternal Grandmother   . Cancer Maternal Grandfather        prostate  . Stroke Paternal Grandmother   . Cancer Paternal Grandfather        prostate  . Stroke Paternal Grandfather   . Atrial fibrillation Father   . Colon cancer Neg Hx   . Allergic rhinitis Neg Hx   . Angioedema Neg Hx   . Asthma Neg Hx   . Atopy Neg Hx   . Eczema Neg Hx   . Urticaria Neg Hx   . Immunodeficiency Neg Hx     Social History   Tobacco Use  . Smoking status: Never Smoker  . Smokeless tobacco: Never Used  Vaping Use  . Vaping Use: Never used  Substance Use Topics  . Alcohol use: No    Comment: occassional  . Drug use: No    Home Medications Prior to Admission medications   Medication Sig Start Date End Date Taking? Authorizing Provider  albuterol (PROVENTIL HFA;VENTOLIN HFA) 108 (90 Base) MCG/ACT inhaler Inhale 2 puffs into the lungs every 4 (four) hours as needed for wheezing or shortness of breath. 03/08/19  Yes Alfonse SpruceGallagher, Joel Louis, MD  APPLE CIDER VINEGAR PO Take 2 capsules by mouth daily.   Yes [provider]  Biotin 5000 MCG CAPS Take 1 capsule by mouth daily.   Yes [provider]  cetirizine (ZYRTEC) 10 MG tablet Take 10 mg by mouth daily.   Yes [provider]  cholestyramine (QUESTRAN) 4 g packet MIX 1 PACKET AS DIRECTED AND DRINK BY MOUTH TWICE DAILY WITH MEALS Patient taking differently: Take 4 g by mouth 2 (two) times daily.  07/16/20  Yes Gelene MinkBoone, Anna W, NP  clobetasol ointment (TEMOVATE) 0.05 % Apply 1 application topically 2 (two) times daily. 01/16/20  Yes Alfonse SpruceGallagher, Joel Louis, MD  DUPIXENT 300 MG/2ML SOPN INJECT 300MG  SUBCUTANEOUSLY EVERY OTHER WEEK Patient taking differently: Inject 300 mg into the skin.  09/21/20  Yes Alfonse SpruceGallagher, Joel Louis, MD  Lisdexamfetamine Dimesylate (VYVANSE) 50 MG CHEW Chew 1 tablet by mouth daily. Take 1 tablet by mouth Monday thru Friday for ADHD   Yes [provider]    sertraline (ZOLOFT) 50 MG tablet Take 1 tablet (50 mg total) by mouth daily. 07/15/20  Yes Adline PotterGriffin, Jennifer A, NP    Allergies    Peanut-containing drug products, Propranolol, Cephalexin, and Imitrex [sumatriptan]  Review of Systems   Review of Systems  Constitutional: Negative for appetite change, chills and fever.  HENT: Negative for congestion, sore throat and trouble swallowing.   Respiratory: Positive for cough and shortness of breath. Negative for chest tightness and wheezing.   Cardiovascular: Negative for chest pain and leg swelling.  Gastrointestinal: Negative for abdominal pain, diarrhea, nausea and vomiting.  Genitourinary: Negative for dysuria.  Musculoskeletal: Negative for arthralgias and myalgias.  Skin: Negative for rash.  Neurological: Negative for dizziness, weakness and numbness.  Hematological: Negative for adenopathy.    Physical Exam Updated Vital Signs BP 131/64 (BP Location: Right Arm)   Pulse 65   Temp 97.9 F (36.6 C) (Oral)   Resp 16   Ht 5\' 4"  (1.626 m)   Wt 121.1 kg   LMP 08/04/2020 (Approximate)   SpO2 100%   BMI 45.83 kg/m   Physical Exam Vitals and nursing note reviewed.  Constitutional:      General: She is not in acute distress.    Appearance: Normal appearance. She is not ill-appearing.  HENT:     Mouth/Throat:     Mouth: Mucous membranes are moist.  Cardiovascular:     Rate and Rhythm: Normal rate and regular rhythm.     Pulses: Normal pulses.  Pulmonary:     Effort: Pulmonary effort is normal. No respiratory distress.     Breath sounds: Normal breath sounds. No wheezing or rhonchi.     Comments: Lungs CTA, no increased work of breathing Abdominal:     General: There is no distension.     Palpations: Abdomen is soft.     Tenderness: There is no abdominal tenderness.  Musculoskeletal:        General: Normal range of motion.     Right lower leg: No edema.     Left lower leg: No edema.  Lymphadenopathy:     Cervical: No  cervical adenopathy.  Skin:    General: Skin is warm.     Findings: No rash.  Neurological:     General: No focal deficit present.     Mental Status: She is alert.     ED Results / Procedures / Treatments   Labs (all labs ordered are listed, but only abnormal results are displayed) Labs Reviewed  CBC - Abnormal; Notable for the following components:      Result Value   WBC 11.6 (*)    Hemoglobin 11.5 (*)    All other components within normal limits  BASIC METABOLIC PANEL - Abnormal; Notable for the following components:   Sodium 133 (*)    Glucose, Bld 105 (*)    All other components within normal limits  RESPIRATORY PANEL BY RT PCR (FLU A&B, COVID)  HCG, SERUM, QUALITATIVE    EKG EKG Interpretation  Date/Time:  Wednesday October 08 2020 17:07:49 EST Ventricular Rate:  77 PR Interval:  194 QRS Duration: 82 QT Interval:  378 QTC Calculation: 427 R Axis:   71 Text Interpretation: Normal sinus rhythm Nonspecific ST abnormality Confirmed by 07-17-1978 (Cathren Laine) on 10/08/2020 6:02:50 PM   Radiology DG Chest 2 View  Result Date: 10/08/2020 CLINICAL DATA:  29 year old female with shortness of breath. EXAM: CHEST - 2 VIEW COMPARISON:  Chest radiograph dated 05/27/2017 FINDINGS: The heart size and mediastinal contours are within normal limits. Both lungs are clear. The visualized skeletal structures are unremarkable. IMPRESSION: No active cardiopulmonary disease. Electronically Signed   By: 07/28/2017 M.D.   On: 10/08/2020 18:09    Procedures Procedures (including critical care time)  Medications Ordered in ED Medications - No data to display  ED Course  I have reviewed the triage vital signs and the nursing notes.  Pertinent labs & imaging results that were available during my care of the patient were reviewed by me and considered in my medical decision making (see chart for details).  MDM Rules/Calculators/A&P                          Patient seen by  her allergist earlier today for cough and shortness of breath for 1 week.  Had in office D-dimer ordered that was mildly elevated at 0.67 patient advised to come to the ER for further evaluation since outpatient imaging could not be done today due to insurance prerequisite.    Labs here reviewed by me, no significant leukocytosis, electrolytes unremarkable.  Covid negative.  CXR w/o acute findings.  EKG w/o acute ischemic changes.    Will proceed with CT angio of the chest, although my clinical suspicion for PE is low.    Discussed with Dr. Clayborne Dana at end of shift.  He agrees to review CT results and arrange dispo. Pt will be discharged home if CT angio negative and continue albuterol MDI and steroids.    Final Clinical Impression(s) / ED Diagnoses Final diagnoses:  None    Rx / DC Orders ED Discharge Orders    None       Pauline Aus, PA-C 10/08/20 2356    Mesner, Barbara Cower, MD 10/08/20 2359

## 2020-10-09 LAB — IGE PEANUT W/COMPONENT REFLEX

## 2020-10-11 LAB — PEANUT COMPONENTS
F352-IgE Ara h 8: <0.1 kU/L
F422-IgE Ara h 1: <0.1 kU/L
F423-IgE Ara h 2: 0.11 kU/L — AB
F424-IgE Ara h 3: <0.1 kU/L
F427-IgE Ara h 9: <0.1 kU/L
F447-IgE Ara h 6: <0.1 kU/L

## 2020-10-11 LAB — IGE PEANUT W/COMPONENT REFLEX: Peanut, IgE: 0.11 kU/L — AB

## 2020-10-11 LAB — ALLERGEN COMPONENT COMMENTS

## 2020-10-15 ENCOUNTER — Ambulatory Visit (INDEPENDENT_AMBULATORY_CARE_PROVIDER_SITE_OTHER): Payer: 59 | Admitting: Adult Health

## 2020-10-15 ENCOUNTER — Encounter: Payer: Self-pay | Admitting: Adult Health

## 2020-10-15 ENCOUNTER — Other Ambulatory Visit: Payer: Self-pay

## 2020-10-15 VITALS — BP 120/76 | HR 78 | Ht 64.0 in | Wt 264.6 lb

## 2020-10-15 DIAGNOSIS — F32A Depression, unspecified: Secondary | ICD-10-CM

## 2020-10-15 NOTE — Progress Notes (Signed)
  Subjective:     Patient ID: Patricia [name], female   DOB: 1991-03-07, 29 y.o.   MRN: [id number]  HPI Kyung is a 29 year old black female. Married, [id number], in for folow up on depression and BP and BP  Is good,did not start losartan, and she has lost 13 lbs. PCP is Dr Margo Aye.   Review of Systems Does not feel depressed Reviewed past medical,surgical, social and family history. Reviewed medications and allergies.     Objective:   Physical Exam BP 120/76 (BP Location: Right Arm, Patient Position: Sitting, Cuff Size: Large)   Pulse 78   Ht 5\' 4"  (1.626 m)   Wt 264 lb 9.6 oz (120 kg)   LMP 10/07/2020 (Exact Date)   BMI 45.42 kg/m  Skin warm and dry.  Lungs: clear to ausculation bilaterally. Cardiovascular: regular rate and rhythm.   PHQ 9 score is 3, no SI, on zoloft   Upstream - 10/15/20 1007      Pregnancy Intention Screening   Does the patient want to become pregnant in the next year? No    Does the patient's partner want to become pregnant in the next year? No    Would the patient like to discuss contraceptive options today? No      Contraception Wrap Up   Current Method Abstinence    End Method Abstinence    Contraception Counseling Provided No          Assessment:     1. Depression, unspecified depression type Will continue zoloft,has refills     Plan:     Follow up in 11 weeks

## 2020-11-04 ENCOUNTER — Other Ambulatory Visit: Payer: Self-pay

## 2020-11-04 ENCOUNTER — Ambulatory Visit (HOSPITAL_COMMUNITY)
Admission: RE | Admit: 2020-11-04 | Discharge: 2020-11-04 | Disposition: A | Discharge status: Home / Self Care | Payer: 59 | Source: Ambulatory Visit | Attending: Internal Medicine | Admitting: Internal Medicine

## 2020-11-04 ENCOUNTER — Other Ambulatory Visit (HOSPITAL_COMMUNITY): Payer: Self-pay | Admitting: Internal Medicine

## 2020-11-04 DIAGNOSIS — M545 Low back pain, unspecified: Secondary | ICD-10-CM | POA: Diagnosis present

## 2020-11-04 DIAGNOSIS — M546 Pain in thoracic spine: Secondary | ICD-10-CM | POA: Insufficient documentation

## 2020-11-05 ENCOUNTER — Encounter: Payer: Self-pay | Admitting: Allergy & Immunology

## 2020-11-05 ENCOUNTER — Ambulatory Visit (INDEPENDENT_AMBULATORY_CARE_PROVIDER_SITE_OTHER): Payer: 59 | Admitting: Allergy & Immunology

## 2020-11-05 VITALS — BP 116/82 | HR 71 | Temp 97.8°F | Resp 18 | Ht 65.0 in | Wt 270.2 lb

## 2020-11-05 DIAGNOSIS — J302 Other seasonal allergic rhinitis: Secondary | ICD-10-CM

## 2020-11-05 DIAGNOSIS — T7800XD Anaphylactic reaction due to unspecified food, subsequent encounter: Secondary | ICD-10-CM

## 2020-11-05 DIAGNOSIS — J3089 Other allergic rhinitis: Secondary | ICD-10-CM | POA: Diagnosis not present

## 2020-11-05 DIAGNOSIS — J4520 Mild intermittent asthma, uncomplicated: Secondary | ICD-10-CM | POA: Diagnosis not present

## 2020-11-05 DIAGNOSIS — L2089 Other atopic dermatitis: Secondary | ICD-10-CM

## 2020-11-05 MED ORDER — PROAIR RESPICLICK 108 (90 BASE) MCG/ACT IN AEPB
1.0000 | INHALATION_SPRAY | Freq: Four times a day (QID) | RESPIRATORY_TRACT | 3 refills | Status: DC | PRN
Start: 2020-11-05 — End: 2021-01-20

## 2020-11-05 NOTE — Progress Notes (Signed)
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 - We are going to get a peanut IgG level to see where this is before making changes. - I am going to talk to our [facility] person to see if this testing is a possibility through them. - We will call you tomorrow. - We are also going to get repeat skin testing next time you come in with Patricia [name].   2. Seasonal and perennial allergic rhinitis - Continue with all of your allergy medications.   3. Flexural atopic dermatitis - Continue with Dupixent every two weeks. - Continue with moisturizing twice daily.   4. Follow up in six months or earlier if needed.   Subjective:   Patricia [name] is a 29 y.o. female presenting today for follow up of  Chief Complaint  Patient presents with  . Asthma    Patricia [name] has a history of the following: Patient Active Problem List   Diagnosis Date Noted  . Hypertension 07/15/2020  . 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 Nexplanon removal 05/20/2020  . Encounter for gynecological examination with Papanicolaou smear of cervix 05/20/2020  . Depression 05/20/2020  . 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 Fatty liver 11/29/2019  . Diarrhea 09/27/2019  . Elevated LFTs 06/17/2017  . Calculus of gallbladder with acute cholecystitis without obstruction   . Nausea vomiting and diarrhea   . RUQ abdominal pain   . Transaminitis   . Biliary colic   . Gall stones, common bile duct   . Abdominal pain 06/04/2017  . Chronic  hypertension complicating or reason for care during childbirth 04/13/2017  . Chronic hypertension in pregnancy 04/13/2017  . Chronic hypertension affecting pregnancy 11/08/2016  . 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 Supervision of high risk pregnancy, antepartum 09/27/2016  . History of gestational hypertension 09/27/2016  . History of shoulder dystocia in prior pregnancy, currently pregnant 09/27/2016  . URI (upper respiratory infection) 08/01/2015  . Persistent cough 08/01/2015  . Obesity, Class III, BMI 40-49.9 (morbid obesity) (HCC) 09/23/2014  . Headache 09/23/2014  . PCO (polycystic ovaries) 03/08/2014    History obtained from: chart review and patient.  Patricia [name] is a 29 y.o. female presenting for a follow up visit. She is well known to this practice. She has a history of food allergies (peanut), intermittent asthma, as well as perennial and seasonal allergic rhinitis. She was last seen in November 2021. At that time, we evaluated her for SOB. We obtained a D-dimer, which unfortunately was elevated. We sent her to the ED, where she waited for quite some time, and a workup was within normal limits. We initially started her on a prednisone burst, but over the next few days her symptoms did not change, so we sent her to the ED. For her peanut allergy, we continued with the use of 8 peanuts daily for her maintenance (reached this dose in June 2021).   Since the last visit, she has done well. Her SOB lasted another weeks or so before it finally resolved.   Asthma/Respiratory Symptom History:  She remains on albuterol as needed. Patricia [name]'s asthma has been well controlled. She has not required rescue medication, experienced nocturnal awakenings due to lower respiratory symptoms, nor have activities of daily living been limited. She has required no Emergency Department or Urgent Care visits for her asthma. She has required zero courses of systemic steroids for asthma exacerbations since  the last visit. ACT score today is 25, indicating excellent asthma symptom control.   Allergic Rhinitis Symptom History: She is not using anything daily aside from her antihistamine. She has a nose spray that she does not use routinely. She has not needed antibiotics in quite some time.   Food Allergy Symptom History: She is doing well with the OIT. She takes it in the morning. She had testing on the skin which demonstrated 15x20 in November 2009. She was tested again in August 2015 that was 5x25. She never had any lab work done until she had bloodwork done a few weeks ago. This demonstrated an IgE of 0.11 to peanut, but was otherwise within normal limits.   Eczema Symptom History: She remains on the Dupixent. She has not been using topical steroids at all. Her hyperpigmented lesion on her hand has returned to normal.   Otherwise, there have been no changes to her past medical history, surgical history, family history, or social history.    Review of Systems  Constitutional: Negative.  Negative for chills, fever, malaise/fatigue and weight loss.  HENT: Negative for congestion, ear discharge, ear pain and sinus pain.   Eyes: Negative for pain, discharge and redness.  Respiratory: Negative for cough, sputum production, shortness of breath and wheezing.   Cardiovascular: Negative.  Negative for chest pain and palpitations.  Gastrointestinal: Negative for abdominal pain, constipation, diarrhea, heartburn, nausea and vomiting.  Skin: Negative.  Negative for itching and rash.  Neurological: Negative for dizziness and headaches.  Endo/Heme/Allergies: Positive for environmental allergies. Does not bruise/bleed easily.       Objective:   Blood pressure 116/82, pulse 71, temperature 97.8 F (36.6 C), temperature source Temporal, resp. rate 18, height 5\' 5"  (1.651 m), weight 270 lb 3.2 oz (122.6 kg), last menstrual period 11/04/2020, SpO2 100 %. Body mass index is 44.96 kg/m.   Physical  Exam:  Physical Exam Constitutional:      Appearance: She is well-developed.  HENT:     Head: Normocephalic and atraumatic.     Right Ear: Tympanic membrane, ear canal and external ear normal.     Left Ear: Tympanic membrane and ear canal normal.     Nose: No nasal deformity, septal deviation, mucosal edema, rhinorrhea or epistaxis.     Right Sinus: No maxillary sinus tenderness or frontal sinus tenderness.     Left Sinus: No maxillary sinus tenderness or frontal sinus tenderness.     Mouth/Throat:     Mouth: Oropharynx is clear and moist. Mucous membranes are not pale and not dry.     Pharynx: Uvula midline.  Eyes:     General:        Right eye: No discharge.        Left eye: No discharge.     Extraocular Movements: EOM normal.     Conjunctiva/sclera: Conjunctivae normal.     Right eye: Right conjunctiva is not injected. No chemosis.    Left eye: Left conjunctiva is not injected. No chemosis.    Pupils: Pupils are equal, round, and reactive to light.  Cardiovascular:     Rate and Rhythm: Normal rate and regular  rhythm.     Heart sounds: Normal heart sounds.  Pulmonary:     Effort: Pulmonary effort is normal. No tachypnea, accessory muscle usage or respiratory distress.     Breath sounds: Normal breath sounds. No wheezing, rhonchi or rales.  Chest:     Chest wall: No tenderness.  Lymphadenopathy:     Cervical: No cervical adenopathy.  Skin:    Coloration: Skin is not pale.     Findings: No abrasion, erythema, petechiae or rash. Rash is not papular, urticarial or vesicular.  Neurological:     Mental Status: She is alert.  Psychiatric:        Mood and Affect: Mood and affect normal.      Diagnostic studies: none     [name], MD  [facility]

## 2020-11-05 NOTE — Patient Instructions (Addendum)
1. 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 - We are going to get a peanut IgG level to see where this is before making changes. - I am going to talk to our [facility] person to see if this testing is a possibility through them. - We will call you tomorrow. - We are also going to get repeat skin testing next time you come in with [name].   2. Seasonal and perennial allergic rhinitis - Continue with all of your allergy medications.   3. Flexural atopic dermatitis - Continue with Dupixent every two weeks. - Continue with moisturizing twice daily.   4. Follow up in six months or earlier if needed.    Please inform us of any Emergency Department visits, hospitalizations, or changes in symptoms. Call us before going to the ED for breathing or allergy symptoms since we might be able to fit you in for a sick visit. Feel free to contact us anytime with any questions, problems, or concerns.  It was a pleasure to see you again today!  Websites that have reliable patient information: 1. American Academy of Asthma, Allergy, and Immunology: www.aaaai.org 2. Food Allergy Research and Education (FARE): foodallergy.org 3. Mothers of Asthmatics: http://www.asthmacommunitynetwork.org 4. American College of Allergy, Asthma, and Immunology: www.acaai.org   COVID-19 Vaccine Information can be found at: PodExchange.nl For questions related to vaccine distribution or appointments, please email [email or web address] or call [phone].     Like [place] on [employer] and Instagram for our latest updates!     HAPPY FALL!     Make sure you are registered to vote! If you have moved or changed any of your contact information, you will need to get this updated before voting!  In some cases, you MAY be able to register to vote online: [email or web address]

## 2020-11-06 NOTE — Progress Notes (Signed)
Here is your reminder.

## 2020-11-07 ENCOUNTER — Ambulatory Visit: Payer: 59 | Admitting: Allergy & Immunology

## 2020-11-19 ENCOUNTER — Encounter: Payer: Self-pay | Admitting: Allergy & Immunology

## 2020-11-19 ENCOUNTER — Telehealth: Payer: Self-pay | Admitting: *Deleted

## 2020-11-19 ENCOUNTER — Other Ambulatory Visit: Payer: Self-pay

## 2020-11-19 ENCOUNTER — Ambulatory Visit (INDEPENDENT_AMBULATORY_CARE_PROVIDER_SITE_OTHER): Payer: 59 | Admitting: Allergy & Immunology

## 2020-11-19 VITALS — BP 122/90 | HR 94 | Temp 97.5°F | Resp 18

## 2020-11-19 DIAGNOSIS — T7800XD Anaphylactic reaction due to unspecified food, subsequent encounter: Secondary | ICD-10-CM | POA: Diagnosis not present

## 2020-11-19 DIAGNOSIS — L2089 Other atopic dermatitis: Secondary | ICD-10-CM

## 2020-11-19 DIAGNOSIS — J4530 Mild persistent asthma, uncomplicated: Secondary | ICD-10-CM | POA: Diagnosis not present

## 2020-11-19 DIAGNOSIS — J3089 Other allergic rhinitis: Secondary | ICD-10-CM

## 2020-11-19 DIAGNOSIS — J302 Other seasonal allergic rhinitis: Secondary | ICD-10-CM

## 2020-11-19 MED ORDER — ALBUTEROL SULFATE HFA 108 (90 BASE) MCG/ACT IN AERS: 2.0000 | INHALATION_SPRAY | RESPIRATORY_TRACT | 1 refills | Status: DC | PRN

## 2020-11-19 NOTE — Progress Notes (Signed)
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  - We are also going to get repeat skin testing next time you come in with [name].   2. Seasonal and perennial allergic rhinitis - Continue with all of your allergy medications.   3. Flexural atopic dermatitis - Continue with Dupixent every two weeks.  - Continue with moisturizing twice daily.   4. Mild persistent asthma, uncomplicated  - Spirometry looked fairly good. - We are going to start an inhaled steroid to see if this helps with your symptoms at all. - I do not think that you need prednisone at this point, but keep me in the loop. - I can certainly send some in if your symptoms do not improve.   5. Return in about 3 months (around 02/17/2021).   Subjective:   Patricia [name] is a 29 y.o. female presenting today for follow up of  Chief Complaint  Patient presents with  . Follow-up  . Asthma    Patricia [name] has a history of the following: Patient Active Problem List   Diagnosis Date Noted  . Hypertension 07/15/2020  . 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 Nexplanon removal 05/20/2020  . Encounter for gynecological examination with Papanicolaou smear of cervix 05/20/2020  . Depression 05/20/2020  . 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 Fatty liver 11/29/2019  . Diarrhea 09/27/2019  . Elevated LFTs 06/17/2017  . Calculus of gallbladder with acute cholecystitis without obstruction   . Nausea vomiting and diarrhea   . RUQ abdominal pain   . Transaminitis   . Biliary colic   . Gall stones, common bile duct    . Abdominal pain 06/04/2017  . 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 Chronic hypertension in pregnancy 04/13/2017  . Chronic hypertension affecting pregnancy 11/08/2016  . 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 Supervision of high risk pregnancy, antepartum 09/27/2016  . History of gestational hypertension 09/27/2016  . History of shoulder dystocia in prior pregnancy, currently pregnant 09/27/2016  . URI (upper respiratory infection) 08/01/2015  . Persistent cough 08/01/2015  . Obesity, Class III, BMI 40-49.9 (morbid obesity) (HCC) 09/23/2014  . Headache 09/23/2014  . PCO (polycystic ovaries) 03/08/2014    History obtained from: chart review and patient.  Patricia [name] is a 29 y.o. female presenting for a follow up visit. She was last seen in December 2021. At that time, she was doing well on 8 peanuts daily for her peanut OIT. We ordered peanut IgG to see if this was high enough to consider dropping doses. Peanut component panel demonstrated an Ara h 2 to 0.11 but was otherwise negative. We also obtained repeat skin testing that was completely negative. For her rhinitis, we continued with all of her OTC allergy medications. Atopic dermatitis was controlled with Dupixent as well as moisturizing twice daily.   In the interim, she has had worsening symptoms of asthma. She has a history of intermittent asthma, which we have never really discussed since it has been so well controlled. She is unsure why her asthma has worsened over the last few  months. She has not had a fever or any viral infections. She denies any infection with COVID19.   She has only albuterol to use as needed. She has been using it more frequently as of late. She is not on any controller medication. The albuterol does help when she uses it. She did get prednisone for her breathing and this did seem to help a lot. She has not been to the ED and has never been intubated. Her  asthma was more severe when she was younger, but for the past 5-10 years, it has not been a problem at all.   She did go to the ED around a month ago when she had a mildly elevated D-dimer. Thankfully the workup was negative for a PE.   Otherwise, there have been no changes to her past medical history, surgical history, family history, or social history.    Review of Systems  Constitutional: Negative.  Negative for chills, fever, malaise/fatigue and weight loss.  HENT: Negative for congestion, ear discharge, ear pain and sinus pain.   Eyes: Negative for pain, discharge and redness.  Respiratory: Positive for cough and wheezing. Negative for sputum production and shortness of breath.   Cardiovascular: Negative.  Negative for chest pain and palpitations.  Gastrointestinal: Negative for abdominal pain, constipation, diarrhea, heartburn, nausea and vomiting.  Skin: Negative.  Negative for itching and rash.  Neurological: Negative for dizziness and headaches.  Endo/Heme/Allergies: Positive for environmental allergies. Does not bruise/bleed easily.       Objective:   Blood pressure 122/90, pulse 94, temperature (!) 97.5 F (36.4 C), temperature source Temporal, resp. rate 18, last menstrual period 11/04/2020, SpO2 99 %. There is no height or weight on file to calculate BMI.   Physical Exam:  Physical Exam Constitutional:      Appearance: She is well-developed.  HENT:     Head: Normocephalic and atraumatic.     Right Ear: Tympanic membrane, ear canal and external ear normal.     Left Ear: Tympanic membrane and ear canal normal.     Nose: No nasal deformity, septal deviation, mucosal edema, rhinorrhea or epistaxis.     Right Sinus: No maxillary sinus tenderness or frontal sinus tenderness.     Left Sinus: No maxillary sinus tenderness or frontal sinus tenderness.     Mouth/Throat:     Mouth: Oropharynx is clear and moist. Mucous membranes are not pale and not dry.     Pharynx:  Uvula midline.  Eyes:     General:        Right eye: No discharge.        Left eye: No discharge.     Extraocular Movements: EOM normal.     Conjunctiva/sclera: Conjunctivae normal.     Right eye: Right conjunctiva is not injected. No chemosis.    Left eye: Left conjunctiva is not injected. No chemosis.    Pupils: Pupils are equal, round, and reactive to light.  Cardiovascular:     Rate and Rhythm: Normal rate and regular rhythm.     Heart sounds: Normal heart sounds.  Pulmonary:     Effort: Pulmonary effort is normal. No tachypnea, accessory muscle usage or respiratory distress.     Breath sounds: Normal breath sounds. No wheezing, rhonchi or rales.     Comments: Moving air well in all lung fields. No tachypneic. No wheezing or crackles noted.  Chest:     Chest wall: No tenderness.  Lymphadenopathy:     Cervical: No cervical adenopathy.  Skin:    Coloration: Skin is not pale.     Findings: No abrasion, erythema, petechiae or rash. Rash is not papular, urticarial or vesicular.  Neurological:     Mental Status: She is alert.  Psychiatric:        Mood and Affect: Mood and affect normal.      Diagnostic studies:    Spirometry: results normal (FEV1: 2.09/74%, FVC: 2.48/75%, FEV1/FVC: 84%).    Spirometry consistent with normal pattern. .  Allergy Studies: none       [name], MD  [facility]

## 2020-11-19 NOTE — Telephone Encounter (Signed)
PA has been initiated for Ventolin through CoverMyMeds and is currently pending approval/denial.

## 2020-11-19 NOTE — Patient Instructions (Addendum)
1. 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  - We are also going to get repeat skin testing next time you come in with [name].   2. Seasonal and perennial allergic rhinitis - Continue with all of your allergy medications.   3. Flexural atopic dermatitis - Continue with Dupixent every two weeks.  - Continue with moisturizing twice daily.   4. Mild persistent asthma, uncomplicated  - Spirometry looked fairly good. - We are going to start an inhaled steroid to see if this helps with your symptoms at all. - I do not think that you need prednisone at this point, but keep me in the loop. - I can certainly send some in if your symptoms do not improve.   5. Return in about 3 months (around 02/17/2021).    Please inform us of any Emergency Department visits, hospitalizations, or changes in symptoms. Call us before going to the ED for breathing or allergy symptoms since we might be able to fit you in for a sick visit. Feel free to contact us anytime with any questions, problems, or concerns.  It was a pleasure to see you again today!  Websites that have reliable patient information: 1. American Academy of Asthma, Allergy, and Immunology: www.aaaai.org 2. Food Allergy Research and Education (FARE): foodallergy.org 3. Mothers of Asthmatics: http://www.asthmacommunitynetwork.org 4. American College of Allergy, Asthma, and Immunology: www.acaai.org   COVID-19 Vaccine Information can be found at: [email or web address].[email or web address] For questions related to vaccine distribution or appointments, please email vaccine@[email or web address] or call [phone].     "Like" [place] on Facebook and Instagram for our latest updates!       Make sure you are registered to vote! If you have moved or changed any of your contact information, you will need to get this updated before voting!  In some cases, you MAY be able to register to vote online:  [email or web address]

## 2020-11-20 MED ORDER — ARMONAIR DIGIHALER 232 MCG/ACT IN AEPB: 1.0000 | INHALATION_SPRAY | Freq: Two times a day (BID) | RESPIRATORY_TRACT | 5 refills | Status: DC

## 2020-11-20 NOTE — Telephone Encounter (Signed)
Approvedon November 19, 2020 Request Reference Number: [id number]. VENTOLIN HFA AER is approved through 11/19/2021. Your patient may now fill this prescription and it will be covered.

## 2020-11-22 ENCOUNTER — Encounter: Payer: Self-pay | Admitting: Allergy & Immunology

## 2020-11-26 NOTE — Addendum Note (Signed)
Addended by: Alfonse Spruce on: 11/26/2020 04:05 PM   Modules accepted: Orders

## 2020-11-27 NOTE — Progress Notes (Signed)
Referring Provider: Benita Stabile, MD Primary Care Physician:  Benita Stabile, MD Primary GI Physician: Dr. Marletta Lor  Chief Complaint  Patient presents with  . Abdominal Pain    None currently  . Diarrhea    Once a month    HPI:   Patricia [name] is a 30 y.o. 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    In November 2020, patient experienced return of postprandial RUQ abdominal pain that had previously resolved with cholecystectomy. CBC, CMP, ultrasound, H. pylori breath test unrevealing.  She was empirically started on Protonix for gastritis/duodenitis with resolution of symptoms. She had also made significant dietary changes and was working on weight loss and Protonix was discontinued.  In June 2021, patient called and reported return of RUQ abdominal pain and Protonix was resumed empirically.  Follow-up office visit in July 2021 and patient reported she had a constellation of symptoms in that included acute onset persistent RUQ abdominal pain, whelps on her face, teeth turn gray, and mild nausea without vomiting.  All symptoms resolved within 2 days.  Not sure Protonix had anything to do with the improvement.  She was completely asymptomatic at her office visit.  Recommended completing 4 weeks of Protonix and tapering off.  Monitor for return of symptoms and follow-up in 6 months.  Today:   Diarrhea: Continues to take Questran 4 g twice daily which is controlling her diarrhea very well.  Having BMs TID but stools are soft and formed. Occasional breakthrough diarrhea about once a month. No blood in the stool or black stool. No unintentional weight loss.   RUQ abdominal pain maybe once every month or every couple of months. Associated with meals when it occurs. May last an hour or so. Very mild. Resolves after a couple of days. No nausea or vomiting. No GERD symptoms. No dysphagia.  No alcohol. Rare NSAIDs.  This doesn't bring on RUQ pain. Last flare was around Christmas. She ate meat at that time. She had [place] and ham. Otherwise she has been avoiding meat.   Past Medical History:  Diagnosis Date  . Abnormal Papanicolaou smear of cervix with positive human papilloma virus (HPV) test 05/28/2020   Pap +HPV other and [facility], will need colpo per ASCCP guidelines,immediate  risk for CIN 3+ 12.6%   . Asthma   . Cough 08/01/2015  . Fatty liver   . Migraine   . Migraines   . Nausea and vomiting during pregnancy prior to [redacted] weeks gestation 08/01/2015  . PCO (polycystic ovaries) 03/08/2014  . Polycystic disease, ovaries   . Pregnancy induced hypertension   . Pregnant 05/27/2015  . URI (upper respiratory infection) 08/01/2015    Past Surgical History:  Procedure Laterality Date  . CHOLECYSTECTOMY N/A 06/08/2017   Procedure: LAPAROSCOPIC CHOLECYSTECTOMY;  Surgeon: Franky Macho, MD;  Location: [facility];  Service: General;  Laterality: N/A;  . TONSILLECTOMY    . WISDOM TOOTH EXTRACTION      Current Outpatient Medications  Medication Sig Dispense Refill  . albuterol (PROVENTIL HFA) 108 (90 Base) MCG/ACT inhaler Inhale 2 puffs into the lungs every 4 (four) hours as needed for wheezing or shortness of breath. 18 g 1  . APPLE CIDER VINEGAR PO Take 2 capsules by mouth daily.    Creig Hines DIGIHALER 232 MCG/ACT AEPB Inhale 1 puff into the lungs in the morning and at bedtime. 1 each 5  . cetirizine (  ZYRTEC) 10 MG tablet Take 10 mg by mouth daily.    . cholestyramine (QUESTRAN) 4 g packet MIX 1 PACKET AS DIRECTED AND DRINK BY MOUTH TWICE DAILY WITH MEALS 60 each 3  . clobetasol ointment (TEMOVATE) 0.05 % Apply 1 application topically 2 (two) times daily. 30 g 0  . DUPIXENT 300 MG/2ML SOPN INJECT 300MG  SUBCUTANEOUSLY EVERY OTHER WEEK (Patient taking differently: Inject 300 mg into the skin.) 4 mL 11  . Lisdexamfetamine Dimesylate (VYVANSE) 50 MG CHEW Chew 1 tablet by mouth daily. Take 1 tablet by mouth Monday thru  Friday for ADHD    . PROAIR RESPICLICK 108 (90 Base) MCG/ACT AEPB Inhale 1-2 puffs into the lungs 4 (four) times daily as needed (and 15-20 minutes prior to exercise). 1 each 3  . sertraline (ZOLOFT) 50 MG tablet Take 1 tablet (50 mg total) by mouth daily. 30 tablet 6   No current facility-administered medications for this visit.    Allergies as of 11/28/2020 - Review Complete 11/28/2020  Allergen Reaction Noted  . Peanut-containing drug products Anaphylaxis and Hives 04/27/2012  . Propranolol Shortness Of Breath 09/23/2014  . Cephalexin Hives 12/22/2011  . Imitrex [sumatriptan] Hives 10/12/2013    Family History  Problem Relation Age of Onset  . Migraines Sister   . Hypertension Brother   . Heart disease Maternal Grandmother   . Cancer Maternal Grandfather        prostate  . Stroke Paternal Grandmother   . Cancer Paternal Grandfather        prostate  . Stroke Paternal Grandfather   . Atrial fibrillation Father   . Colon cancer Neg Hx   . Allergic rhinitis Neg Hx   . Angioedema Neg Hx   . Asthma Neg Hx   . Atopy Neg Hx   . Eczema Neg Hx   . Urticaria Neg Hx   . Immunodeficiency Neg Hx     Social History   Socioeconomic History  . Marital status: Married    Spouse name: Not on file  . Number of children: Not on file  . Years of education: Not on file  . Highest education level: Not on file  Occupational History  . Not on file  Tobacco Use  . Smoking status: Never Smoker  . Smokeless tobacco: Never Used  Vaping Use  . Vaping Use: Never used  Substance and Sexual Activity  . Alcohol use: Not Currently    Comment: occassional  . Drug use: No  . Sexual activity: Not Currently    Birth control/protection: None, Pill  Other Topics Concern  . Not on file  Social History Narrative  . Not on file   Social Determinants of Health   Financial Resource Strain: Low Risk   . Difficulty of Paying Living Expenses: Not hard at all  Food Insecurity: No Food Insecurity   . Worried About 10/14/2013 in the Last Year: Never true  . Ran Out of Food in the Last Year: Never true  Transportation Needs: No Transportation Needs  . Lack of Transportation (Medical): No  . Lack of Transportation (Non-Medical): No  Physical Activity: Sufficiently Active  . Days of Exercise per Week: 5 days  . Minutes of Exercise per Session: 30 min  Stress: Stress Concern Present  . Feeling of Stress : Very much  Social Connections: Moderately Integrated  . Frequency of Communication with Friends and Family: More than three times a week  . Frequency of Social Gatherings with Friends and Family:  Once a week  . Attends Religious Services: More than 4 times per year  . Active Member of Clubs or Organizations: No  . Attends Archivist Meetings: Never  . Marital Status: Married    Review of Systems: Gen: Denies fever, chills, flulike symptoms, lightheadedness, dizziness, presyncope, syncope. CV: Denies chest pain or palpitations. Resp: Occasional shortness of breath with asthma flares.  Also with chronic intermittent cough related to asthma. GI: See HPI Heme: See HPI  Physical Exam: BP (!) 139/93   Pulse 73   Temp 97.6 F (36.4 C)   Ht 5\' 5"  (1.651 m)   Wt 274 lb 6.4 oz (124.5 kg)   LMP 11/04/2020   BMI 45.66 kg/m  General:   Alert and oriented. No distress noted. Pleasant and cooperative.  Head:  Normocephalic and atraumatic. Eyes:  Conjuctiva clear without scleral icterus. Heart:  S1, S2 present without murmurs appreciated. Lungs:  Clear to auscultation bilaterally. No wheezes, rales, or rhonchi. No distress.  Abdomen:  +BS, soft, non-tender and non-distended. No rebound or guarding. No HSM or masses noted. Msk:  Symmetrical without gross deformities. Normal posture. Extremities:  Without edema. Neurologic:  Alert and  oriented x4 Psych: Normal mood and affect.

## 2020-11-28 ENCOUNTER — Ambulatory Visit (INDEPENDENT_AMBULATORY_CARE_PROVIDER_SITE_OTHER): Payer: 59 | Admitting: Adult Health

## 2020-11-28 ENCOUNTER — Encounter: Payer: Self-pay | Admitting: Gastroenterology

## 2020-11-28 ENCOUNTER — Other Ambulatory Visit: Payer: Self-pay

## 2020-11-28 ENCOUNTER — Encounter: Payer: Self-pay | Admitting: Adult Health

## 2020-11-28 ENCOUNTER — Ambulatory Visit (INDEPENDENT_AMBULATORY_CARE_PROVIDER_SITE_OTHER): Payer: 59 | Admitting: Gastroenterology

## 2020-11-28 VITALS — BP 146/94 | HR 69 | Ht 64.0 in | Wt 275.0 lb

## 2020-11-28 VITALS — BP 139/93 | HR 73 | Temp 97.6°F | Ht 65.0 in | Wt 274.4 lb

## 2020-11-28 DIAGNOSIS — R1011 Right upper quadrant pain: Secondary | ICD-10-CM

## 2020-11-28 DIAGNOSIS — R197 Diarrhea, unspecified: Secondary | ICD-10-CM

## 2020-11-28 DIAGNOSIS — R102 Pelvic and perineal pain: Secondary | ICD-10-CM

## 2020-11-28 NOTE — Progress Notes (Signed)
  Subjective:     Patient ID: Patricia [name], female   DOB: 1991/08/26, 30 y.o.   MRN: [id number]  HPI Patricia [name] is a 30 year old black female, married, [id number], in complaining of pelvic pain x 1 week. No trouble with peeing or BMs. PCP is Dr Margo Aye.  Review of Systems  +pelvic pain x 1 week bilaterally Denies any problems with urination or bowel movements Has not had sex since 2017  Reviewed past medical,surgical, social and family history. Reviewed medications and allergies.     Objective:   Physical Exam BP (!) 146/94 (BP Location: Left Arm, Patient Position: Sitting, Cuff Size: Large)   Pulse 69   Ht 5\' 4"  (1.626 m)   Wt 275 lb (124.7 kg)   LMP 11/04/2020   BMI 47.20 kg/m  Skin warm and dry.Pelvic: external genitalia is normal in appearance no lesions, vagina: white discharge with odor,urethra has no lesions or masses noted, cervix:smooth and bulbous, uterus: normal size, shape and contour, non tender, no masses felt, adnexa: no masses or tenderness noted. Bladder is non tender and no masses felt. Fall risk is moderate  Upstream - 11/28/20 1310      Pregnancy Intention Screening   Does the patient want to become pregnant in the next year? No    Does the patient's partner want to become pregnant in the next year? No    Would the patient like to discuss contraceptive options today? No      Contraception Wrap Up   Current Method Abstinence    End Method Abstinence    Contraception Counseling Provided No         Examination chaperoned by 01/26/21 RN    Assessment:     1. Pelvic pain Will get pelvic [facility] Kobus at [facility] 12/04/20 at 2:30 pm    Plan:     Will talk when results back

## 2020-11-28 NOTE — Patient Instructions (Signed)
We will arrange for you to have an upper endoscopy in the near future with Dr. Marletta Lor.  Keep a log of when your abdominal pain occurs and what you have eaten around that time.   Please stop apple cider vinegar for now.   Avoid NSAIDs including ibuprofen, Aleve, Advil, Goody Powders, and anything that says "NSAID" on the package.   Continue Questran 4 g twice daily.  We will plan to see back after your procedures.  Ermalinda Memos, PA-C [facility]

## 2020-11-28 NOTE — Patient Instructions (Signed)
PA for EGD submitted via [facility] website. PA# [id number], valid 12/30/20-03/30/21.

## 2020-11-28 NOTE — Assessment & Plan Note (Addendum)
30 year old female with intermittent postprandial RUQ abdominal pain with history of cholecystectomy in 2018.  She had resolution of RUQ abdominal pain after cholecystectomy until  November 2020.  Work-up with CBC, CMP, H. pylori breath test, RUQ ultrasound all unrevealing.  Empirically treated with Protonix 40 mg daily for possible gastritis or duodenitis and symptoms initially improved, Protonix was discontinued.  She was asymptomatic between January 2021-June 2021, but has since had return of intermittent flares of mild postprandial RUQ abdominal pain once a month to once every 2 months that lasts a couple days at a time.  She has made significant dietary changes and has not identified any specific triggers.  No associated nausea, vomiting, or GERD symptoms. Abdominal exam is benign. No alcohol. Rare NSAIDs.   Etiology is not clear.  I recommended we pursue EGD to evaluate this further.  She will need duodenal biopsies to evaluate for celiac disease.  Plan: Proceed with EGD with propofol with Dr. Marletta Lor in the near future. The risks, benefits, and alternatives have been discussed with the patient in detail. The patient states understanding and desires to proceed.  ASA III Will hold off on resuming PPI for now.  Stop apple cider vinegar for now. Avoid NSAIDs.  Requested she keep a log of when her abdominal pain occurs and food she has eaten around this time. Follow-up after EGD.

## 2020-11-28 NOTE — Assessment & Plan Note (Signed)
Likely dealing with bile salt diarrhea as symptoms started postcholecystectomy 2018.  Symptoms are well controlled on Questran 4 g twice daily.  No alarm symptoms.  Advise she continue her current medications.

## 2020-12-04 ENCOUNTER — Ambulatory Visit (HOSPITAL_COMMUNITY): Payer: 59

## 2020-12-06 ENCOUNTER — Other Ambulatory Visit: Payer: 59

## 2020-12-06 DIAGNOSIS — Z20822 Contact with and (suspected) exposure to covid-19: Secondary | ICD-10-CM

## 2020-12-09 ENCOUNTER — Telehealth: Payer: Self-pay | Admitting: *Deleted

## 2020-12-09 LAB — NOVEL CORONAVIRUS, NAA: SARS-CoV-2, NAA: DETECTED — AB

## 2020-12-09 NOTE — Telephone Encounter (Signed)
Patient called for covid results,advised they are still pending and to call back. 

## 2020-12-11 NOTE — Addendum Note (Signed)
Addended by: Alfonse Spruce on: 12/11/2020 09:48 AM   Modules accepted: Orders

## 2020-12-15 ENCOUNTER — Other Ambulatory Visit: Payer: Self-pay | Admitting: Gastroenterology

## 2020-12-15 DIAGNOSIS — R1011 Right upper quadrant pain: Secondary | ICD-10-CM

## 2020-12-15 DIAGNOSIS — R197 Diarrhea, unspecified: Secondary | ICD-10-CM

## 2020-12-17 ENCOUNTER — Ambulatory Visit (HOSPITAL_COMMUNITY)
Admission: RE | Admit: 2020-12-17 | Discharge: 2020-12-17 | Disposition: A | Discharge status: Home / Self Care | Payer: 59 | Source: Ambulatory Visit | Attending: Adult Health | Admitting: Adult Health

## 2020-12-17 ENCOUNTER — Other Ambulatory Visit: Payer: Self-pay

## 2020-12-17 DIAGNOSIS — R102 Pelvic and perineal pain: Secondary | ICD-10-CM | POA: Insufficient documentation

## 2020-12-25 ENCOUNTER — Telehealth: Payer: Self-pay

## 2020-12-25 ENCOUNTER — Encounter: Payer: Self-pay | Admitting: *Deleted

## 2020-12-25 NOTE — Telephone Encounter (Signed)
EGD w/Propofol w/Dr. Marletta Lor ASA 3 for 12/30/20 has to be rescheduled per endo.  Called pt, procedure moved to 01/20/21--PM. Endo scheduler informed.

## 2020-12-29 ENCOUNTER — Other Ambulatory Visit (HOSPITAL_COMMUNITY): Payer: 59

## 2020-12-29 ENCOUNTER — Encounter (HOSPITAL_COMMUNITY): Payer: 59

## 2020-12-31 ENCOUNTER — Other Ambulatory Visit: Payer: Self-pay

## 2020-12-31 ENCOUNTER — Ambulatory Visit (INDEPENDENT_AMBULATORY_CARE_PROVIDER_SITE_OTHER): Payer: 59 | Admitting: Adult Health

## 2020-12-31 ENCOUNTER — Encounter: Payer: Self-pay | Admitting: Adult Health

## 2020-12-31 VITALS — BP 130/79 | HR 75 | Ht 64.0 in | Wt 289.0 lb

## 2020-12-31 DIAGNOSIS — F32A Depression, unspecified: Secondary | ICD-10-CM | POA: Diagnosis not present

## 2020-12-31 MED ORDER — SERTRALINE HCL 50 MG PO TABS: 50.0000 mg | ORAL_TABLET | Freq: Every day | ORAL | 6 refills | Status: DC

## 2020-12-31 NOTE — Progress Notes (Signed)
  Subjective:     Patient ID: Patricia [name], female   DOB: 01-Oct-1991, 30 y.o.   MRN: [id number]  HPI Patricia [name] is a 30 year old,married, [id number] back in follow on meds and is feeling better. PCP is Dr Margo Aye   Review of Systems Feels better on zoloft Not depressed, just not sleeping well Reviewed past medical,surgical, social and family history. Reviewed medications and allergies.     Objective:   Physical Exam BP 130/79 (BP Location: Left Arm, Patient Position: Sitting, Cuff Size: Large)   Pulse 75   Ht 5\' 4"  (1.626 m)   Wt 289 lb (131.1 kg)   LMP 12/29/2020   BMI 49.61 kg/m  Skin warm and dry. Lungs: clear to ausculation bilaterally. Cardiovascular: regular rate and rhythm. Fall risk is low PHQ 9 score is 8 feels better on zoloft and current dose is good GAD 7 score is 1  Upstream - 12/31/20 0842      Pregnancy Intention Screening   Does the patient want to become pregnant in the next year? No    Does the patient's partner want to become pregnant in the next year? No    Would the patient like to discuss contraceptive options today? No      Contraception Wrap Up   Current Method Abstinence    End Method Abstinence    Contraception Counseling Provided No             Assessment:     1. Depression, unspecified depression type Continue Zoloft Meds ordered this encounter  Medications  . sertraline (ZOLOFT) 50 MG tablet    Sig: Take 1 tablet (50 mg total) by mouth daily.    Dispense:  30 tablet    Refill:  6    Order Specific Question:   Supervising Provider    Answer:   02/28/21 [[id number]]    Plan:     Return in 5 months for physical and ROS

## 2021-01-15 ENCOUNTER — Ambulatory Visit
Admission: EM | Admit: 2021-01-15 | Discharge: 2021-01-15 | Disposition: A | Discharge status: Home / Self Care | Payer: 59 | Attending: Physician Assistant | Admitting: Physician Assistant

## 2021-01-15 ENCOUNTER — Other Ambulatory Visit: Payer: Self-pay

## 2021-01-15 ENCOUNTER — Encounter: Payer: Self-pay | Admitting: Emergency Medicine

## 2021-01-15 DIAGNOSIS — J4540 Moderate persistent asthma, uncomplicated: Secondary | ICD-10-CM | POA: Diagnosis not present

## 2021-01-15 MED ORDER — PREDNISONE 50 MG PO TABS: ORAL_TABLET | ORAL | 0 refills | Status: DC

## 2021-01-15 NOTE — ED Triage Notes (Signed)
Cough x a few months.  Woke up today and her chest hurts when she cough, productive cough. Hx of asthma

## 2021-01-15 NOTE — ED Provider Notes (Signed)
[facility]    CSN: [id number] Arrival date & time: 01/15/21  1607      History   Chief Complaint No chief complaint on file.   HPI Patricia [name] is a 30 y.o. female.   The history is provided by the patient. No language interpreter was used.  Shortness of Breath Severity:  Moderate Onset quality:  Gradual Progression:  Worsening Chronicity:  New Relieved by:  Nothing Worsened by:  Nothing Ineffective treatments:  None tried Associated symptoms: cough     Past Medical History:  Diagnosis Date  . Abnormal Papanicolaou smear of cervix with positive human papilloma virus (HPV) test 05/28/2020   Pap +HPV other and [facility], will need colpo per ASCCP guidelines,immediate  risk for CIN 3+ 12.6%   . Asthma   . Cough 08/01/2015  . Fatty liver   . Migraine   . Migraines   . Nausea and vomiting during pregnancy prior to [redacted] weeks gestation 08/01/2015  . PCO (polycystic ovaries) 03/08/2014  . Polycystic disease, ovaries   . Pregnancy induced hypertension   . Pregnant 05/27/2015  . URI (upper respiratory infection) 08/01/2015    Patient Active Problem List   Diagnosis Date Noted  . Pelvic pain 11/28/2020  . Hypertension 07/15/2020  . 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 Nexplanon removal 05/20/2020  . Encounter for gynecological examination with Papanicolaou smear of cervix 05/20/2020  . Depression 05/20/2020  . 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 Fatty liver 11/29/2019  . Diarrhea 09/27/2019  . Elevated LFTs 06/17/2017  . Calculus of gallbladder with acute cholecystitis without obstruction   . Nausea vomiting and diarrhea   . RUQ abdominal pain   . Transaminitis   . Biliary colic   . Gall stones, common bile duct   . Abdominal pain 06/04/2017  . 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 Chronic hypertension in pregnancy 04/13/2017  .  Chronic hypertension affecting pregnancy 11/08/2016  . 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 Supervision of high risk pregnancy, antepartum 09/27/2016  . History of gestational hypertension 09/27/2016  . History of shoulder dystocia in prior pregnancy, currently pregnant 09/27/2016  . URI (upper respiratory infection) 08/01/2015  . Persistent cough 08/01/2015  . Obesity, Class III, BMI 40-49.9 (morbid obesity) (HCC) 09/23/2014  . Headache 09/23/2014  . PCO (polycystic ovaries) 03/08/2014    Past Surgical History:  Procedure Laterality Date  . CHOLECYSTECTOMY N/A 06/08/2017   Procedure: LAPAROSCOPIC CHOLECYSTECTOMY;  Surgeon: Franky Macho, MD;  Location: [facility];  Service: General;  Laterality: N/A;  . TONSILLECTOMY    . WISDOM TOOTH EXTRACTION      OB History    Gravida  3   Para  2   Term  2   Preterm      AB  1   Living  2     SAB  1   IAB      Ectopic      Multiple  0   Live Births  2            Home Medications    Prior to Admission medications   Medication Sig Start Date End Date Taking? Authorizing Provider  predniSONE (DELTASONE) 50 MG tablet One a day 01/15/21  Yes Elson Areas, PA-C  albuterol (PROVENTIL HFA) 108 (90 Base) MCG/ACT inhaler Inhale 2 puffs into the lungs every 4 (four) hours as  needed for wheezing or shortness of breath. 11/19/20   Alfonse Spruce, MD  ARMONAIR DIGIHALER 232 MCG/ACT AEPB Inhale 1 puff into the lungs in the morning and at bedtime. 11/20/20   Alfonse Spruce, MD  cetirizine (ZYRTEC) 10 MG tablet Take 10 mg by mouth daily.    [provider]  cholestyramine (QUESTRAN) 4 g packet MIX 1 PACKET AS DIRECTED AND DRINK BY MOUTH TWICE DAILY WITH MEALS Patient taking differently: Take 4 g by mouth 2 (two) times daily. 12/16/20   Letta Median, PA-C  clobetasol ointment (TEMOVATE) 0.05 % Apply 1 application topically 2 (two) times daily. Patient taking differently: Apply 1 application  topically 2 (two) times daily as needed (irritation). 01/16/20   Alfonse Spruce, MD  DUPIXENT 300 MG/2ML SOPN INJECT 300MG  SUBCUTANEOUSLY EVERY OTHER WEEK Patient taking differently: Inject 300 mg into the skin every 14 (fourteen) days. 09/21/20   09/23/20, MD  phentermine 15 MG capsule Take 15 mg by mouth daily. 12/22/20   [provider]  PROAIR RESPICLICK 108 (90 Base) MCG/ACT AEPB Inhale 1-2 puffs into the lungs 4 (four) times daily as needed (and 15-20 minutes prior to exercise). 11/05/20   11/07/20, MD  sertraline (ZOLOFT) 50 MG tablet Take 1 tablet (50 mg total) by mouth daily. 12/31/20   02/28/21, NP    Family History Family History  Problem Relation Age of Onset  . Migraines Sister   . Hypertension Brother   . Heart disease Maternal Grandmother   . Cancer Maternal Grandfather        prostate  . Stroke Paternal Grandmother   . Cancer Paternal Grandfather        prostate  . Stroke Paternal Grandfather   . Atrial fibrillation Father   . Colon cancer Neg Hx   . Allergic rhinitis Neg Hx   . Angioedema Neg Hx   . Asthma Neg Hx   . Atopy Neg Hx   . Eczema Neg Hx   . Urticaria Neg Hx   . Immunodeficiency Neg Hx     Social History Social History   Tobacco Use  . Smoking status: Never Smoker  . Smokeless tobacco: Never Used  Vaping Use  . Vaping Use: Never used  Substance Use Topics  . Alcohol use: Not Currently    Comment: occassional  . Drug use: No     Allergies   Peanut-containing drug products, Propranolol, Cephalexin, and Imitrex [sumatriptan]   Review of Systems Review of Systems  Respiratory: Positive for cough and shortness of breath.   All other systems reviewed and are negative.    Physical Exam Triage Vital Signs ED Triage Vitals [01/15/21 0945]  Enc Vitals Group     BP 122/88     Pulse Rate 81     Resp 18     Temp 98.6 F (37 C)     Temp Source Oral     SpO2 99 %     Weight      Height       Head Circumference      Peak Flow      Pain Score 5     Pain Loc      Pain Edu?      Excl. in GC?    No data found.  Updated Vital Signs BP 122/88 (BP Location: Right Arm)   Pulse 81   Temp 98.6 F (37 C) (Oral)   Resp 18   LMP  12/29/2020   SpO2 99%   Visual Acuity Right Eye Distance:   Left Eye Distance:   Bilateral Distance:    Right Eye Near:   Left Eye Near:    Bilateral Near:     Physical Exam Vitals and nursing note reviewed.  Constitutional:      Appearance: She is well-developed and well-nourished.  HENT:     Head: Normocephalic.  Eyes:     Extraocular Movements: EOM normal.  Cardiovascular:     Rate and Rhythm: Normal rate.  Pulmonary:     Effort: Pulmonary effort is normal.     Breath sounds: Wheezing present.  Abdominal:     General: There is no distension.  Musculoskeletal:        General: Normal range of motion.     Cervical back: Normal range of motion.  Neurological:     Mental Status: She is alert and oriented to person, place, and time.  Psychiatric:        Mood and Affect: Mood and affect normal.      UC Treatments / Results  Labs (all labs ordered are listed, but only abnormal results are displayed) Labs Reviewed - No data to display  EKG   Radiology No results found.  Procedures Procedures (including critical care time)  Medications Ordered in UC Medications - No data to display  Initial Impression / Assessment and Plan / UC Course  I have reviewed the triage vital signs and the nursing notes.  Pertinent labs & imaging results that were available during my care of the patient were reviewed by me and considered in my medical decision making (see chart for details).     MDM:  Pt has faint wheezing.  Pt given rx for prednsione.  I advised rehceck if any problems.  Final Clinical Impressions(s) / UC Diagnoses   Final diagnoses:  Moderate persistent asthma without complication     Discharge Instructions      Return if any problems.    ED Prescriptions    Medication Sig Dispense Auth. Provider   predniSONE (DELTASONE) 50 MG tablet One a day 6 tablet Elson Areas, [place]     PDMP not reviewed this encounter.  An After Visit Summary was printed and given to the patient.    Elson Areas, [place] 01/15/21 1016

## 2021-01-15 NOTE — Discharge Instructions (Signed)
Return if any problems.

## 2021-01-16 NOTE — Patient Instructions (Signed)
Patricia [name]  01/16/2021     @PREFPERIOPPHARMACY @   Your procedure is scheduled on  01/20/2021.   Report to 03/22/2021 at  1145  A.M.   Call this number if you have problems the morning of surgery:  [phone]   Remember:  Follow the diet instructions given to you by the office.                     Take these medicines the morning of surgery with A SIP OF WATER  Prednisone, zoloft, zyrtec.  Use your inahlers before you come and bring your rescue inhaler with you.    Please brush your teeth.  Do not wear jewelry, make-up or nail polish.  Do not wear lotions, powders, or perfumes, or deodorant.  Do not shave 48 hours prior to surgery.  Men may shave face and neck.  Do not bring valuables to the hospital.  [facility] is not responsible for any belongings or valuables.   Contacts, dentures or bridgework may not be worn into surgery.  Leave your suitcase in the car.  After surgery it may be brought to your room.  For patients admitted to the hospital, discharge time will be determined by your treatment team.  Patients discharged the day of surgery will not be allowed to drive home and they must have someone with them for 24 hours.   Special instructions:   DO NOT smoke tobacco or vape the morning of your procedure.   Please read over the following fact sheets that you were given. Anesthesia Post-op Instructions and Care and Recovery After Surgery       Upper Endoscopy, Adult, Care After This sheet gives you information about how to care for yourself after your procedure. Your health care provider may also give you more specific instructions. If you have problems or questions, contact your health care provider. What can I expect after the procedure? After the procedure, it is common to have:  A sore throat.  Mild stomach pain or discomfort.  Bloating.  Nausea. Follow these instructions at home:  Follow instructions from your health care provider  about what to eat or drink after your procedure.  Return to your normal activities as told by your health care provider. Ask your health care provider what activities are safe for you.  Take over-the-counter and prescription medicines only as told by your health care provider.  If you were given a sedative during the procedure, it can affect you for several hours. Do not drive or operate machinery until your health care provider says that it is safe.  Keep all follow-up visits as told by your health care provider. This is important.   Contact a health care provider if you have:  A sore throat that lasts longer than one day.  Trouble swallowing. Get help right away if:  You vomit blood or your vomit looks like coffee grounds.  You have: ? A fever. ? Bloody, black, or tarry stools. ? A severe sore throat or you cannot swallow. ? Difficulty breathing. ? Severe pain in your chest or abdomen. Summary  After the procedure, it is common to have a sore throat, mild stomach discomfort, bloating, and nausea.  If you were given a sedative during the procedure, it can affect you for several hours. Do not drive or operate machinery until your health care provider says that it is safe.  Follow instructions from your health care provider  about what to eat or drink after your procedure.  Return to your normal activities as told by your health care provider. This information is not intended to replace advice given to you by your health care provider. Make sure you discuss any questions you have with your health care provider. Document Revised: 11/06/2019 Document Reviewed: 04/10/2018 Elsevier Patient Education  2021 [place] After This sheet gives you information about how to care for yourself after your procedure. Your health care provider may also give you more specific instructions. If you have problems or questions, contact your health care  provider. What can I expect after the procedure? After the procedure, it is common to have:  Tiredness.  Forgetfulness about what happened after the procedure.  Impaired judgment for important decisions.  Nausea or vomiting.  Some difficulty with balance. Follow these instructions at home: For the time period you were told by your health care provider:  Rest as needed.  Do not participate in activities where you could fall or become injured.  Do not drive or use machinery.  Do not drink alcohol.  Do not take sleeping pills or medicines that cause drowsiness.  Do not make important decisions or sign legal documents.  Do not take care of children on your own.      Eating and drinking  Follow the diet that is recommended by your health care provider.  Drink enough fluid to keep your urine pale yellow.  If you vomit: ? Drink water, juice, or soup when you can drink without vomiting. ? Make sure you have little or no nausea before eating solid foods. General instructions  Have a responsible adult stay with you for the time you are told. It is important to have someone help care for you until you are awake and alert.  Take over-the-counter and prescription medicines only as told by your health care provider.  If you have sleep apnea, surgery and certain medicines can increase your risk for breathing problems. Follow instructions from your health care provider about wearing your sleep device: ? Anytime you are sleeping, including during daytime naps. ? While taking prescription pain medicines, sleeping medicines, or medicines that make you drowsy.  Avoid smoking.  Keep all follow-up visits as told by your health care provider. This is important. Contact a health care provider if:  You keep feeling nauseous or you keep vomiting.  You feel light-headed.  You are still sleepy or having trouble with balance after 24 hours.  You develop a rash.  You have a  fever.  You have redness or swelling around the IV site. Get help right away if:  You have trouble breathing.  You have new-onset confusion at home. Summary  For several hours after your procedure, you may feel tired. You may also be forgetful and have poor judgment.  Have a responsible adult stay with you for the time you are told. It is important to have someone help care for you until you are awake and alert.  Rest as told. Do not drive or operate machinery. Do not drink alcohol or take sleeping pills.  Get help right away if you have trouble breathing, or if you suddenly become confused. This information is not intended to replace advice given to you by your health care provider. Make sure you discuss any questions you have with your health care provider. Document Revised: 07/24/2020 Document Reviewed: 10/11/2019 Elsevier Patient Education  2021 [employer].

## 2021-01-16 NOTE — Pre-Procedure Instructions (Signed)
Called patient to make sure she has stopped her phentermine. Her last dose was 01/15/2021.

## 2021-01-19 ENCOUNTER — Encounter (HOSPITAL_COMMUNITY): Payer: Self-pay

## 2021-01-19 ENCOUNTER — Other Ambulatory Visit (HOSPITAL_COMMUNITY): Payer: 59

## 2021-01-19 ENCOUNTER — Encounter (HOSPITAL_COMMUNITY)
Admission: RE | Admit: 2021-01-19 | Discharge: 2021-01-19 | Disposition: A | Discharge status: Home / Self Care | Payer: 59 | Source: Ambulatory Visit | Attending: Internal Medicine | Admitting: Internal Medicine

## 2021-01-19 ENCOUNTER — Other Ambulatory Visit: Payer: Self-pay

## 2021-01-19 DIAGNOSIS — Z01812 Encounter for preprocedural laboratory examination: Secondary | ICD-10-CM | POA: Insufficient documentation

## 2021-01-19 DIAGNOSIS — Z20822 Contact with and (suspected) exposure to covid-19: Secondary | ICD-10-CM | POA: Diagnosis not present

## 2021-01-19 LAB — PREGNANCY, URINE: Preg Test, Ur: NEGATIVE

## 2021-01-20 ENCOUNTER — Encounter (HOSPITAL_COMMUNITY)
Admission: RE | Disposition: A | Discharge status: Home / Self Care | Payer: Self-pay | Source: Home / Self Care | Attending: Internal Medicine

## 2021-01-20 ENCOUNTER — Ambulatory Visit (HOSPITAL_COMMUNITY): Payer: 59 | Admitting: Anesthesiology

## 2021-01-20 ENCOUNTER — Encounter (HOSPITAL_COMMUNITY): Payer: Self-pay

## 2021-01-20 ENCOUNTER — Other Ambulatory Visit: Payer: Self-pay

## 2021-01-20 ENCOUNTER — Ambulatory Visit (HOSPITAL_COMMUNITY)
Admission: RE | Admit: 2021-01-20 | Discharge: 2021-01-20 | Disposition: A | Discharge status: Home / Self Care | Payer: 59 | Attending: Internal Medicine | Admitting: Internal Medicine

## 2021-01-20 DIAGNOSIS — Z91010 Allergy to peanuts: Secondary | ICD-10-CM | POA: Diagnosis not present

## 2021-01-20 DIAGNOSIS — Z7951 Long term (current) use of inhaled steroids: Secondary | ICD-10-CM | POA: Diagnosis not present

## 2021-01-20 DIAGNOSIS — Z87892 Personal history of anaphylaxis: Secondary | ICD-10-CM | POA: Diagnosis not present

## 2021-01-20 DIAGNOSIS — K2970 Gastritis, unspecified, without bleeding: Secondary | ICD-10-CM | POA: Diagnosis not present

## 2021-01-20 DIAGNOSIS — Z7952 Long term (current) use of systemic steroids: Secondary | ICD-10-CM | POA: Insufficient documentation

## 2021-01-20 DIAGNOSIS — Z881 Allergy status to other antibiotic agents status: Secondary | ICD-10-CM | POA: Insufficient documentation

## 2021-01-20 DIAGNOSIS — K2100 Gastro-esophageal reflux disease with esophagitis, without bleeding: Secondary | ICD-10-CM

## 2021-01-20 DIAGNOSIS — Z888 Allergy status to other drugs, medicaments and biological substances status: Secondary | ICD-10-CM | POA: Insufficient documentation

## 2021-01-20 DIAGNOSIS — K2950 Unspecified chronic gastritis without bleeding: Secondary | ICD-10-CM | POA: Insufficient documentation

## 2021-01-20 DIAGNOSIS — R1011 Right upper quadrant pain: Secondary | ICD-10-CM | POA: Diagnosis present

## 2021-01-20 HISTORY — PX: BIOPSY: SHX5522

## 2021-01-20 HISTORY — PX: ESOPHAGOGASTRODUODENOSCOPY (EGD) WITH PROPOFOL: SHX5813

## 2021-01-20 SURGERY — ESOPHAGOGASTRODUODENOSCOPY (EGD) WITH PROPOFOL
Anesthesia: General

## 2021-01-20 MED ORDER — GLYCOPYRROLATE 0.2 MG/ML IJ SOLN: 0.2000 mg | Freq: Once | INTRAMUSCULAR | Status: AC

## 2021-01-20 MED ORDER — PROPOFOL 10 MG/ML IV BOLUS
INTRAVENOUS | Status: DC | PRN
  Administered 2021-01-20: 50 mg via INTRAVENOUS
  Administered 2021-01-20 (×2): 20 mg via INTRAVENOUS
  Administered 2021-01-20: 50 mg via INTRAVENOUS

## 2021-01-20 MED ORDER — LIDOCAINE HCL 1 % IJ SOLN
INTRAMUSCULAR | Status: DC | PRN
  Administered 2021-01-20: 60 mg via INTRADERMAL

## 2021-01-20 MED ORDER — PROPOFOL 10 MG/ML IV BOLUS
INTRAVENOUS | Status: AC
  Filled 2021-01-20: qty 60

## 2021-01-20 MED ORDER — KETAMINE HCL 50 MG/5ML IJ SOSY
PREFILLED_SYRINGE | INTRAMUSCULAR | Status: AC
  Filled 2021-01-20: qty 5

## 2021-01-20 MED ORDER — OMEPRAZOLE 20 MG PO CPDR: 20.0000 mg | DELAYED_RELEASE_CAPSULE | Freq: Two times a day (BID) | ORAL | 5 refills | Status: DC

## 2021-01-20 MED ORDER — LIDOCAINE VISCOUS HCL 2 % MT SOLN: 15.0000 mL | Freq: Once | OROMUCOSAL | Status: AC

## 2021-01-20 MED ORDER — PROPOFOL 500 MG/50ML IV EMUL
INTRAVENOUS | Status: DC | PRN
  Administered 2021-01-20: 150 ug/kg/min via INTRAVENOUS

## 2021-01-20 MED ORDER — STERILE WATER FOR IRRIGATION IR SOLN
Status: DC | PRN
  Administered 2021-01-20: 1.5 mL

## 2021-01-20 MED ORDER — LACTATED RINGERS IV SOLN: INTRAVENOUS | Status: DC

## 2021-01-20 MED ORDER — LIDOCAINE HCL (PF) 2 % IJ SOLN
INTRAMUSCULAR | Status: AC
  Filled 2021-01-20: qty 5

## 2021-01-20 MED ORDER — LIDOCAINE VISCOUS HCL 2 % MT SOLN
OROMUCOSAL | Status: AC
  Administered 2021-01-20: 15 mL via OROMUCOSAL
  Filled 2021-01-20: qty 15

## 2021-01-20 MED ORDER — GLYCOPYRROLATE 0.2 MG/ML IJ SOLN
INTRAMUSCULAR | Status: AC
  Administered 2021-01-20: 0.2 mg via INTRAVENOUS
  Filled 2021-01-20: qty 1

## 2021-01-20 NOTE — Anesthesia Postprocedure Evaluation (Signed)
Anesthesia Post Note  Patient: Patricia [name]  Procedure(s) Performed: ESOPHAGOGASTRODUODENOSCOPY (EGD) WITH PROPOFOL (N/A ) BIOPSY  Patient location during evaluation: Short Stay Anesthesia Type: General Level of consciousness: awake and alert and oriented Pain management: pain level controlled Vital Signs Assessment: post-procedure vital signs reviewed and stable Respiratory status: spontaneous breathing Cardiovascular status: blood pressure returned to baseline and stable Postop Assessment: no apparent nausea or vomiting Anesthetic complications: no   No complications documented.   Last Vitals:  Vitals:   01/20/21 0931 01/20/21 1135  BP: (!) 154/100 (!) 154/72  Pulse: 73 85  Resp: 18 (!) 22  Temp: 36.7 C 36.8 C  SpO2: 100% 100%    Last Pain:  Vitals:   01/20/21 1135  TempSrc: Oral  PainSc: 0-No pain                 Josslynn Mentzer

## 2021-01-20 NOTE — Transfer of Care (Signed)
Immediate Anesthesia Transfer of Care Note  Patient: Patricia [name]  Procedure(s) Performed: ESOPHAGOGASTRODUODENOSCOPY (EGD) WITH PROPOFOL (N/A ) BIOPSY  Patient Location: Short Stay  Anesthesia Type:General  Level of Consciousness: awake  Airway & Oxygen Therapy: Patient Spontanous Breathing  Post-op Assessment: Report given to RN  Post vital signs: Reviewed and stable  Last Vitals:  Vitals Value Taken Time  BP    Temp    Pulse    Resp    SpO2      Last Pain:  Vitals:   01/20/21 1135  TempSrc: Oral  PainSc: 0-No pain      Patients Stated Pain Goal: 5 (01/20/21 0931)  Complications: No complications documented.

## 2021-01-20 NOTE — Anesthesia Preprocedure Evaluation (Addendum)
Anesthesia Evaluation  Patient identified by MRN, date of birth, ID band Patient awake    Reviewed: Allergy & Precautions, NPO status , Patient's Chart, lab work & pertinent test results  History of Anesthesia Complications Negative for: history of anesthetic complications  Airway Mallampati: II  TM Distance: >3 FB Neck ROM: Full    Dental  (+) Dental Advisory Given,  [place] :   Pulmonary asthma ,    Pulmonary exam normal breath sounds clear to auscultation       Cardiovascular Exercise Tolerance: Good hypertension, Pt. on medications Normal cardiovascular exam Rhythm:Regular Rate:Normal     Neuro/Psych  Headaches, PSYCHIATRIC DISORDERS Depression    GI/Hepatic negative GI ROS, Fatty liver, elevated LFTs   Endo/Other  Morbid obesity  Renal/GU negative Renal ROS  negative genitourinary   Musculoskeletal negative musculoskeletal ROS (+)   Abdominal   Peds negative pediatric ROS (+)  Hematology   Anesthesia Other Findings   Reproductive/Obstetrics negative OB ROS                            Anesthesia Physical Anesthesia Plan  ASA: III  Anesthesia Plan: General   Post-op Pain Management:    Induction: Intravenous  PONV Risk Score and Plan: TIVA  Airway Management Planned: Nasal Cannula and Natural Airway  Additional Equipment:   Intra-op Plan:   Post-operative Plan:   Informed Consent: I have reviewed the patients History and Physical, chart, labs and discussed the procedure including the risks, benefits and alternatives for the proposed anesthesia with the patient or authorized representative who has indicated his/her understanding and acceptance.       Plan Discussed with: CRNA and Surgeon  Anesthesia Plan Comments:         Anesthesia Quick Evaluation

## 2021-01-20 NOTE — Op Note (Signed)
[facility] Patient Name: Patricia [name] Procedure Date: 01/20/2021 10:54 AM MRN: [id number] Date of Birth: 1991-04-26 Attending MD: Elon Alas. Abbey Chatters DO CSN: [id number] Age: 30 Admit Type: Outpatient Procedure:                Upper GI endoscopy Indications:              Abdominal pain in the right upper quadrant Providers:                Elon Alas. Abbey Chatters, DO, Charlsie Quest. Theda Sers RN, RN,                            Randa Spike, Technician Referring MD:              Medicines:                See the Anesthesia note for documentation of the                            administered medications Complications:            No immediate complications. Estimated Blood Loss:     Estimated blood loss was minimal. Procedure:                Pre-Anesthesia Assessment:                           - The anesthesia plan was to use monitored                            anesthesia care (MAC).                           After obtaining informed consent, the endoscope was                            passed under direct vision. Throughout the                            procedure, the patient's blood pressure, pulse, and                            oxygen saturations were monitored continuously. The                            [id number]-H190 ([id number]) scope was introduced through the                            mouth, and advanced to the second part of duodenum.                            The upper GI endoscopy was accomplished without                            difficulty. The patient tolerated the procedure  well. Scope In: 11:18:11 AM Scope Out: 11:22:56 AM Total Procedure Duration: 0 hours 4 minutes 45 seconds  Findings:      LA Grade A (one or more mucosal breaks less than 5 mm, not extending       between tops of 2 mucosal folds) esophagitis with no bleeding was found       at the gastroesophageal junction.      Biopsies were taken with a cold forceps in the middle third of the        esophagus for histology.      Diffuse moderate inflammation characterized by erosions and erythema was       found in the entire examined stomach. Biopsies were taken with a cold       forceps for Helicobacter pylori testing.      The duodenal bulb, first portion of the duodenum and second portion of       the duodenum were normal. Biopsies for histology were taken with a cold       forceps for evaluation of celiac disease. Impression:               - LA Grade A reflux esophagitis with no bleeding.                           - Gastritis. Biopsied.                           - Normal duodenal bulb, first portion of the                            duodenum and second portion of the duodenum.                            Biopsied.                           - Biopsies were taken with a cold forceps for                            histology in the middle third of the esophagus. Moderate Sedation:      Per Anesthesia Care Recommendation:           - Patient has a contact number available for                            emergencies. The signs and symptoms of potential                            delayed complications were discussed with the                            patient. Return to normal activities tomorrow.                            Written discharge instructions were provided to the                            patient.                           -  Resume previous diet.                           - Continue present medications.                           - Await pathology results.                           - Use a proton pump inhibitor PO BID for 8 weeks                            then decrease to once daily                           - No ibuprofen, naproxen, or other non-steroidal                            anti-inflammatory drugs.                           - Return to GI clinic in 3 months. Procedure Code(s):        --- Professional ---                           [phone],  Esophagogastroduodenoscopy, flexible,                            transoral; with biopsy, single or multiple Diagnosis Code(s):        --- Professional ---                           K21.00, Gastro-esophageal reflux disease with                            esophagitis, without bleeding                           K29.70, Gastritis, unspecified, without bleeding                           R10.11, Right upper quadrant pain CPT copyright 2019 American Medical Association. All rights reserved. The codes documented in this report are preliminary and upon coder review may  be revised to meet current compliance requirements. Elon Alas. Abbey Chatters, DO [place] Abbey Chatters, DO 01/20/2021 11:29:53 AM This report has been signed electronically. Number of Addenda: 0

## 2021-01-20 NOTE — Discharge Instructions (Signed)
EGD Discharge instructions Please read the instructions outlined below and refer to this sheet in the next few weeks. These discharge instructions provide you with general information on caring for yourself after you leave the hospital. Your doctor may also give you specific instructions. While your treatment has been planned according to the most current medical practices available, unavoidable complications occasionally occur. If you have any problems or questions after discharge, please call your doctor. ACTIVITY  You may resume your regular activity but move at a slower pace for the next 24 hours.   Take frequent rest periods for the next 24 hours.   Walking will help expel (get rid of) the air and reduce the bloated feeling in your abdomen.   No driving for 24 hours (because of the anesthesia (medicine) used during the test).   You may shower.   Do not sign any important legal documents or operate any machinery for 24 hours (because of the anesthesia used during the test).  NUTRITION  Drink plenty of fluids.   You may resume your normal diet.   Begin with a light meal and progress to your normal diet.   Avoid alcoholic beverages for 24 hours or as instructed by your caregiver.  MEDICATIONS  You may resume your normal medications unless your caregiver tells you otherwise.  WHAT YOU CAN EXPECT TODAY  You may experience abdominal discomfort such as a feeling of fullness or "gas" pains.  FOLLOW-UP  Your doctor will discuss the results of your test with you.  SEEK IMMEDIATE MEDICAL ATTENTION IF ANY OF THE FOLLOWING OCCUR:  Excessive nausea (feeling sick to your stomach) and/or vomiting.   Severe abdominal pain and distention (swelling).   Trouble swallowing.   Temperature over 101 F (37.8 C).   Rectal bleeding or vomiting of blood.   Your EGD showed a mild amount inflammation in your stomach.  I biopsied this to rule out infection with bacteria called H. pylori.  Also  took biopsies of your small bowel and esophagus to look for underlying disorders.  Await pathology results, my office will contact you.  I am going to start you on omeprazole 20 mg twice daily for the next 8 weeks.  You can decrease back down to once daily thereafter.  Avoid NSAIDs as best as you can.  Follow-up with GI in 2 to 3 months.   I hope you have a great rest of your week!  Hennie Duos. Marletta Lor, D.O. Gastroenterology and Hepatology [facility]

## 2021-01-20 NOTE — H&P (Signed)
Primary Care Physician:  Benita Stabile, MD Primary Gastroenterologist:  Dr. Marletta Lor  Pre-Procedure History & Physical: HPI:  Patricia [name] is a 30 y.o. female is here here for an EGD due to history of RUQ pain. RUQ abdominal pain maybe once every month or every couple of months. Associated with meals when it occurs. May last an hour or so. Very mild. Resolves after a couple of days. No nausea or vomiting. No GERD symptoms. No dysphagia.  No alcohol. Rare NSAIDs. This doesn't bring on RUQ pain. Last flare was around Christmas. She ate meat at that time. She had [place] and ham. Otherwise she has been avoiding meat.  Past Medical History:  Diagnosis Date  . Abnormal Papanicolaou smear of cervix with positive human papilloma virus (HPV) test 05/28/2020   Pap +HPV other and [facility], will need colpo per ASCCP guidelines,immediate  risk for CIN 3+ 12.6%   . Asthma   . Cough 08/01/2015  . Fatty liver   . Migraine   . Migraines   . Nausea and vomiting during pregnancy prior to [redacted] weeks gestation 08/01/2015  . PCO (polycystic ovaries) 03/08/2014  . Polycystic disease, ovaries   . Pregnancy induced hypertension   . Pregnant 05/27/2015  . URI (upper respiratory infection) 08/01/2015    Past Surgical History:  Procedure Laterality Date  . CHOLECYSTECTOMY N/A 06/08/2017   Procedure: LAPAROSCOPIC CHOLECYSTECTOMY;  Surgeon: Franky Macho, MD;  Location: [facility];  Service: General;  Laterality: N/A;  . TONSILLECTOMY    . WISDOM TOOTH EXTRACTION      Prior to Admission medications   Medication Sig Start Date End Date Taking? Authorizing Provider  albuterol (PROVENTIL HFA) 108 (90 Base) MCG/ACT inhaler Inhale 2 puffs into the lungs every 4 (four) hours as needed for wheezing or shortness of breath. 11/19/20  Yes Alfonse Spruce, MD  ARMONAIR DIGIHALER 232 MCG/ACT AEPB Inhale 1 puff into the lungs in the morning and at bedtime. 11/20/20  Yes Alfonse Spruce, MD  cetirizine (ZYRTEC) 10 MG tablet Take 10 mg  by mouth daily.   Yes [provider]  cholestyramine (QUESTRAN) 4 g packet MIX 1 PACKET AS DIRECTED AND DRINK BY MOUTH TWICE DAILY WITH MEALS Patient taking differently: Take 4 g by mouth 2 (two) times daily. 12/16/20  Yes Letta Median, PA-C  clobetasol ointment (TEMOVATE) 0.05 % Apply 1 application topically 2 (two) times daily. Patient taking differently: Apply 1 application topically 2 (two) times daily as needed (irritation). 01/16/20  Yes Alfonse Spruce, MD  DUPIXENT 300 MG/2ML SOPN INJECT 300MG  SUBCUTANEOUSLY EVERY OTHER WEEK Patient taking differently: Inject 300 mg into the skin every 14 (fourteen) days. 09/21/20  Yes 09/23/20, MD  predniSONE (DELTASONE) 50 MG tablet One a day 01/15/21  Yes 01/17/21, PA-C  sertraline (ZOLOFT) 50 MG tablet Take 1 tablet (50 mg total) by mouth daily. 12/31/20  Yes 02/28/21 A, NP  phentermine 15 MG capsule Take 15 mg by mouth daily. 12/22/20   [provider]  PROAIR RESPICLICK 108 (90 Base) MCG/ACT AEPB Inhale 1-2 puffs into the lungs 4 (four) times daily as needed (and 15-20 minutes prior to exercise). 11/05/20   11/07/20, MD    Allergies as of 11/28/2020 - Review Complete 11/28/2020  Allergen Reaction Noted  . Peanut-containing drug products Anaphylaxis and Hives 04/27/2012  . Propranolol Shortness Of Breath 09/23/2014  . Cephalexin Hives 12/22/2011  . Imitrex [sumatriptan] Hives 10/12/2013    Family  History  Problem Relation Age of Onset  . Migraines Sister   . Hypertension Brother   . Heart disease Maternal Grandmother   . Cancer Maternal Grandfather        prostate  . Stroke Paternal Grandmother   . Cancer Paternal Grandfather        prostate  . Stroke Paternal Grandfather   . Atrial fibrillation Father   . Colon cancer Neg Hx   . Allergic rhinitis Neg Hx   . Angioedema Neg Hx   . Asthma Neg Hx   . Atopy Neg Hx   . Eczema Neg Hx   . Urticaria Neg Hx   .  Immunodeficiency Neg Hx     Social History   Socioeconomic History  . Marital status: Married    Spouse name: Not on file  . Number of children: Not on file  . Years of education: Not on file  . Highest education level: Not on file  Occupational History  . Not on file  Tobacco Use  . Smoking status: Never Smoker  . Smokeless tobacco: Never Used  Vaping Use  . Vaping Use: Never used  Substance and Sexual Activity  . Alcohol use: Not Currently    Comment: occassional  . Drug use: No  . Sexual activity: Not Currently    Birth control/protection: None  Other Topics Concern  . Not on file  Social History Narrative  . Not on file   Social Determinants of Health   Financial Resource Strain: Low Risk   . Difficulty of Paying Living Expenses: Not hard at all  Food Insecurity: No Food Insecurity  . Worried About Programme researcher, broadcasting/film/video in the Last Year: Never true  . Ran Out of Food in the Last Year: Never true  Transportation Needs: No Transportation Needs  . Lack of Transportation (Medical): No  . Lack of Transportation (Non-Medical): No  Physical Activity: Sufficiently Active  . Days of Exercise per Week: 5 days  . Minutes of Exercise per Session: 30 min  Stress: Stress Concern Present  . Feeling of Stress : Very much  Social Connections: Moderately Integrated  . Frequency of Communication with Friends and Family: More than three times a week  . Frequency of Social Gatherings with Friends and Family: Once a week  . Attends Religious Services: More than 4 times per year  . Active Member of Clubs or Organizations: No  . Attends Banker Meetings: Never  . Marital Status: Married  Catering manager Violence: Not At Risk  . Fear of Current or Ex-Partner: No  . Emotionally Abused: No  . Physically Abused: No  . Sexually Abused: No    Review of Systems: See HPI, otherwise negative ROS  Physical Exam: Vital signs in last 24 hours: Temp:  [98 F (36.7 C)] 98 F  (36.7 C) (03/01 0931) Pulse Rate:  [73] 73 (03/01 0931) Resp:  [18] 18 (03/01 0931) BP: (154)/(100) 154/100 (03/01 0931) SpO2:  [100 %] 100 % (03/01 0931)   General:   Alert,  Well-developed, well-nourished, pleasant and cooperative in NAD Head:  Normocephalic and atraumatic. Eyes:  Sclera clear, no icterus.   Conjunctiva pink. Ears:  Normal auditory acuity. Nose:  No deformity, discharge,  or lesions. Mouth:  No deformity or lesions, dentition normal. Neck:  Supple; no masses or thyromegaly. Lungs:  Clear throughout to auscultation.   No wheezes, crackles, or rhonchi. No acute distress. Heart:  Regular rate and rhythm; no murmurs, clicks, rubs,  or gallops. Abdomen:  Soft, nontender and nondistended. No masses, hepatosplenomegaly or hernias noted. Normal bowel sounds, without guarding, and without rebound.   Msk:  Symmetrical without gross deformities. Normal posture. Extremities:  Without clubbing or edema. Neurologic:  Alert and  oriented x4;  grossly normal neurologically. Skin:  Intact without significant lesions or rashes. Cervical Nodes:  No significant cervical adenopathy. Psych:  Alert and cooperative. Normal mood and affect.  Impression/Plan: BENJAMIN MERRIHEW is here for an EGD due to history of RUQ pain  The risks of the procedure including infection, bleed, or perforation as well as benefits, limitations, alternatives and imponderables have been reviewed with the patient. Questions have been answered. All parties agreeable.

## 2021-01-21 LAB — SURGICAL PATHOLOGY

## 2021-01-26 ENCOUNTER — Encounter (HOSPITAL_COMMUNITY): Payer: Self-pay | Admitting: Internal Medicine

## 2021-02-18 ENCOUNTER — Encounter: Payer: Self-pay | Admitting: Adult Health

## 2021-02-18 ENCOUNTER — Ambulatory Visit (INDEPENDENT_AMBULATORY_CARE_PROVIDER_SITE_OTHER): Payer: 59 | Admitting: Allergy & Immunology

## 2021-02-18 ENCOUNTER — Other Ambulatory Visit: Payer: Self-pay

## 2021-02-18 ENCOUNTER — Telehealth: Payer: Self-pay | Admitting: Internal Medicine

## 2021-02-18 ENCOUNTER — Encounter: Payer: Self-pay | Admitting: Allergy & Immunology

## 2021-02-18 VITALS — BP 122/82 | HR 84 | Resp 18 | Ht 64.0 in | Wt 287.0 lb

## 2021-02-18 DIAGNOSIS — T7800XD Anaphylactic reaction due to unspecified food, subsequent encounter: Secondary | ICD-10-CM

## 2021-02-18 DIAGNOSIS — L2089 Other atopic dermatitis: Secondary | ICD-10-CM

## 2021-02-18 DIAGNOSIS — J4530 Mild persistent asthma, uncomplicated: Secondary | ICD-10-CM | POA: Diagnosis not present

## 2021-02-18 DIAGNOSIS — J302 Other seasonal allergic rhinitis: Secondary | ICD-10-CM

## 2021-02-18 DIAGNOSIS — J3089 Other allergic rhinitis: Secondary | ICD-10-CM | POA: Diagnosis not present

## 2021-02-18 NOTE — Patient Instructions (Addendum)
1. 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  - Stop your antihistamines NEXT Sunday so that we can do skin testing to peanut in one week.  - Get the peanut IgG4 test from [facility]. - We will likely do a traditional peanut challenge in the near future if both of these tests look good.   2. Seasonal and perennial allergic rhinitis - Continue with all of your allergy medications.   3. Flexural atopic dermatitis - Continue with Dupixent every two weeks.  - Continue with moisturizing twice daily.   4. Mild persistent asthma, uncomplicated  - Spirometry looked fairly good. - Continue with ArmonAir one puff twice daily. - Sample provided. - We will send in a copay card.  - Continue with albuterol 2-4 puffs every 4-6 hours as needed.   5. Return in about 3 months (around 05/21/2021).    Please inform us of any Emergency Department visits, hospitalizations, or changes in symptoms. Call us before going to the ED for breathing or allergy symptoms since we might be able to fit you in for a sick visit. Feel free to contact us anytime with any questions, problems, or concerns.  It was a pleasure to see you again today! We LOVE you guys!   Websites that have reliable patient information: 1. American Academy of Asthma, Allergy, and Immunology: www.aaaai.org 2. Food Allergy Research and Education (FARE): foodallergy.org 3. Mothers of Asthmatics: http://www.asthmacommunitynetwork.org 4. American College of Allergy, Asthma, and Immunology: www.acaai.org   COVID-19 Vaccine Information can be found at: [email or web address] For questions related to vaccine distribution or appointments, please email [email or web address] or call [phone].   We realize that you might be concerned about having an allergic reaction to the COVID19 vaccines. To help with that concern, WE ARE OFFERING THE COVID19 VACCINES IN OUR OFFICE!  Ask the front desk for dates!     "Like" [place] on Facebook and Instagram for our latest updates!      A healthy democracy works best when [employer] participate! Make sure you are registered to vote! If you have moved or changed any of your contact information, you will need to get this updated before voting!  In some cases, you MAY be able to register to vote online: [email or web address]

## 2021-02-18 NOTE — Progress Notes (Signed)
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  - Stop your antihistamines NEXT Sunday so that we can do skin testing to peanut in one week.  - Get the peanut IgG4 test from [facility]. - We will likely do a traditional peanut challenge in the near future if both of these tests look good.   2. Seasonal and perennial allergic rhinitis - Continue with all of your allergy medications.   3. Flexural atopic dermatitis - Continue with Dupixent every two weeks.  - Continue with moisturizing twice daily.   4. Mild persistent asthma, uncomplicated  - Spirometry looked fairly good. - Continue with ArmonAir one puff twice daily. - Sample provided. - We will send in a copay card.  - Continue with albuterol 2-4 puffs every 4-6 hours as needed.   5. Return in about 3 months (around 05/21/2021).   Subjective:   Patricia [name] is a 30 y.o. female presenting today for follow up of  Chief Complaint  Patient presents with  . Asthma    Patricia [name] has a history of the following: Patient Active Problem List   Diagnosis Date Noted  . Pelvic pain 11/28/2020  . Hypertension 07/15/2020  . 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 Nexplanon removal 05/20/2020  . Encounter for gynecological examination with Papanicolaou smear of cervix 05/20/2020  . Depression 05/20/2020  . 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 Fatty liver 11/29/2019  . Diarrhea 09/27/2019  . Elevated LFTs 06/17/2017  . Calculus of gallbladder with acute cholecystitis without obstruction   . Nausea  vomiting and diarrhea   . RUQ abdominal pain   . Transaminitis   . Biliary colic   . Gall stones, common bile duct   . Abdominal pain 06/04/2017  . 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 Chronic hypertension in pregnancy 04/13/2017  . Chronic hypertension affecting pregnancy 11/08/2016  . 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 Supervision of high risk pregnancy, antepartum 09/27/2016  . History of gestational hypertension 09/27/2016  . History of shoulder dystocia in prior pregnancy, currently pregnant 09/27/2016  . URI (upper respiratory infection) 08/01/2015  . Persistent cough 08/01/2015  . Obesity, Class III, BMI 40-49.9 (morbid obesity) (HCC) 09/23/2014  . Headache 09/23/2014  . PCO (polycystic ovaries) 03/08/2014    History obtained from: chart review and patient.  Patricia [name] is a 30 y.o. female presenting for a follow up visit.  She was last seen in December 2021.  At that time, her spirometry looked good.  We started the steroid to see if that helped her asthma symptoms.  We did not feel that prednisone was needed, but she did have my number in case this change.  We continue with Dupixent every 2 weeks as well as moisturizing twice daily.  We will continue with her allergy medications as well as her 8 peanuts daily.  Since last visit, she has done well. She had an EGD performed that showed some evidence of reflux. She has been spitting up at night and then last night had vomiting. She is unsure of what  is going on. She does feel that the asthma is better controlled as she does not have tightness like she used to have.   She is on omeprazole twice daily for reflux. Stomach pain resolved with the omeprazole. She was diagnosed with chronic gastritis. Celiac was negative.    Asthma/Respiratory Symptom History: She has been doing well from an asthma perspective. Patricia [name]'s asthma has been well controlled. She has not required rescue  medication, experienced nocturnal awakenings due to lower respiratory symptoms, nor have activities of daily living been limited. She has required no Emergency Department or Urgent Care visits for her asthma. She has required zero courses of systemic steroids for asthma exacerbations since the last visit. ACT score today is 22, indicating excellent asthma symptom control.   She did received prednisone in February 2022. She went to the Urgent Care for this visit.  Allergic Rhinitis Symptom History: She does take allergy medications on a daily basis. She has not been using a daily nasal steroid.   Food Allergy Symptom History: She continues to eat 8 peanuts daily. She has not had problems with this. She has not gotten her IgG4 peanut lab done yet but she is going to get around to it soon  Eczema Symptom History: Eczema is very well controlled. She has been using her Dupixent every two weeks. The spot on her hand has finally resolved. She has just been using her moisturizers daily and has not required the use of topical steroids for rescue.   Otherwise, there have been no changes to her past medical history, surgical history, family history, or social history.    Review of Systems  Constitutional: Negative.  Negative for fever, malaise/fatigue and weight loss.  HENT: Negative.  Negative for congestion, ear discharge and ear pain.   Eyes: Negative for pain, discharge and redness.  Respiratory: Negative for cough, sputum production, shortness of breath and wheezing.   Cardiovascular: Negative.  Negative for chest pain and palpitations.  Gastrointestinal: Negative for abdominal pain, heartburn, nausea and vomiting.  Skin: Negative.  Negative for itching and rash.  Neurological: Negative for dizziness and headaches.  Endo/Heme/Allergies: Negative for environmental allergies. Does not bruise/bleed easily.       Objective:   Blood pressure 122/82, pulse 84, resp. rate 18, height 5\' 4"  (1.626 m),  weight 287 lb (130.2 kg), SpO2 100 %. Body mass index is 49.26 kg/m.   Physical Exam:  Physical Exam Constitutional:      Appearance: She is well-developed.     Comments: Very lovely as always. Talkative.  HENT:     Head: Normocephalic and atraumatic.     Right Ear: Tympanic membrane, ear canal and external ear normal.     Left Ear: Tympanic membrane, ear canal and external ear normal.     Nose: No nasal deformity, septal deviation, mucosal edema or rhinorrhea.     Right Turbinates: Enlarged and swollen.     Left Turbinates: Enlarged and swollen.     Right Sinus: No maxillary sinus tenderness or frontal sinus tenderness.     Left Sinus: No maxillary sinus tenderness or frontal sinus tenderness.     Mouth/Throat:     Mouth: Mucous membranes are not pale and not dry.     Pharynx: Uvula midline.  Eyes:     General:        Right eye: No discharge.        Left eye: No discharge.     Conjunctiva/sclera: Conjunctivae normal.  Right eye: Right conjunctiva is not injected. No chemosis.    Left eye: Left conjunctiva is not injected. No chemosis.    Pupils: Pupils are equal, round, and reactive to light.  Cardiovascular:     Rate and Rhythm: Normal rate and regular rhythm.     Heart sounds: Normal heart sounds.  Pulmonary:     Effort: Pulmonary effort is normal. No tachypnea, accessory muscle usage or respiratory distress.     Breath sounds: Normal breath sounds. No wheezing, rhonchi or rales.  Chest:     Chest wall: No tenderness.  Lymphadenopathy:     Cervical: No cervical adenopathy.  Skin:    General: Skin is warm.     Capillary Refill: Capillary refill takes less than 2 seconds.     Coloration: Skin is not pale.     Findings: No abrasion, erythema, petechiae or rash. Rash is not papular, urticarial or vesicular.     Comments: Her hands look particularly good today. The lesions that was on her right hand has since cleared completely.   Neurological:     Mental Status: She  is alert.      Diagnostic studies:    Spirometry: results normal (FEV1: 2.29/83%, FVC: 2.87/89%, FEV1/FVC: 80%).    Spirometry consistent with normal pattern.   Allergy Studies: none        [name], MD  [facility]

## 2021-02-18 NOTE — Telephone Encounter (Signed)
PLEASE CALL PATIENT , SHE IS VOMITING AT NIGHT AND WANTS TO KNOW IF THAT CAN BE FROM HER GASTRITIS

## 2021-02-19 NOTE — Telephone Encounter (Signed)
Make sure to take PPI BID, 30 minutes before breakfast and dinner  Avoid eating 2-3 hours before laying down. Make sure she is not eating late at night. May prop head of bed up on risers.   Further recommendations per Baxter Hire when she returns.

## 2021-02-19 NOTE — Telephone Encounter (Signed)
Phoned and advised the pt of Anna's instructions and that further recommendations will follow once Baxter Hire returns. Pt agreed. (pt had seen her PCP and they didn't hear anything in her lungs, she seen her Allergist yesterday they encouraged her to continue using her inhaler and allergy meds.

## 2021-02-19 NOTE — Telephone Encounter (Signed)
Pt called yesterday complaining of spitting up (not vomiting) at night while she is asleep. States it starts with a cough and then it begins. I asked her does she have allergies and she does. States she's been coughing for months now. The spitting up doesn't happen until she goes to bed. But she coughs throughout the day as well. No fever noted. Wants to know could it be her gastritis or should she see her PCP to be evaluated for something else. Please advise

## 2021-02-20 ENCOUNTER — Other Ambulatory Visit: Payer: Self-pay | Admitting: Gastroenterology

## 2021-02-20 DIAGNOSIS — K209 Esophagitis, unspecified without bleeding: Secondary | ICD-10-CM

## 2021-02-20 DIAGNOSIS — K2970 Gastritis, unspecified, without bleeding: Secondary | ICD-10-CM

## 2021-02-20 DIAGNOSIS — R059 Cough, unspecified: Secondary | ICD-10-CM

## 2021-02-20 MED ORDER — OMEPRAZOLE 40 MG PO CPDR: 40.0000 mg | DELAYED_RELEASE_CAPSULE | Freq: Two times a day (BID) | ORAL | 1 refills | Status: DC

## 2021-02-20 NOTE — Telephone Encounter (Signed)
Spoke with patient. She has been struggling with a cough for the last few months.  About 3 days ago, she woke up coughing at night and ended up vomiting.  The following day she did the same thing.  Last night, she did not have any vomiting.  She does cough mildly during the day but seems to be worse at night.  She does not feel her symptoms are typical of her asthma flares.  Her asthma and allergy doctor has advise she continue her current medications.  She is also taking omeprazole 20 mg twice daily which was started after recent EGD which revealed esophagitis and gastritis.  Denies any typical reflux symptoms, though she has never had any typical reflux symptoms.    We will go ahead and empirically treat this as an atypical presentation of GERD.  I will increase omeprazole to 40 mg twice daily for the next 4-8 weeks to see if she has any improvement.  If no improvement, we will resume 20 mg twice daily and she will need to follow-up with her asthma and allergy doctor.  She will also continue to avoid eating within 2 to 3 hours of going to bed and prop the head of her bed up.   Requested progress report in 2-4 weeks.  We will see if we can get her an appointment in the next 4-6 weeks.  Stacey: Please arrange follow-up with myself or Dr. Marletta Lor in 4-6 weeks. Dx: Cough, gastritis, esophagitis

## 2021-02-22 ENCOUNTER — Encounter: Payer: Self-pay | Admitting: Allergy & Immunology

## 2021-02-23 NOTE — Telephone Encounter (Signed)
Noted. Thanks.

## 2021-02-23 NOTE — Telephone Encounter (Signed)
Patient scheduled to see you 04/22/21, I called and left her a message to call and we can move up her appointment

## 2021-02-25 NOTE — Addendum Note (Signed)
Addended by: Orson Aloe on: 02/25/2021 05:33 PM   Modules accepted: Orders

## 2021-02-25 NOTE — Progress Notes (Signed)
We did peanut testing while her son was here for a clinic visit.  She was negative to peanut with adequate control.  [name], MD [facility]

## 2021-04-21 ENCOUNTER — Telehealth: Payer: Self-pay | Admitting: *Deleted

## 2021-04-21 NOTE — Telephone Encounter (Signed)
Patricia [name], you are scheduled for a virtual visit with your provider today.  Just as we do with appointments in the office, we must obtain your consent to participate.  Your consent will be active for this visit and any virtual visit you may have with one of our providers in the next 365 days.  If you have a MyChart account, I can also send a copy of this consent to you electronically.  All virtual visits are billed to your insurance company just like a traditional visit in the office.  As this is a virtual visit, video technology does not allow for your provider to perform a traditional examination.  This may limit your provider's ability to fully assess your condition.  If your provider identifies any concerns that need to be evaluated in person or the need to arrange testing such as labs, EKG, etc, we will make arrangements to do so.  Although advances in technology are sophisticated, we cannot ensure that it will always work on either your end or our end.  If the connection with a video visit is poor, we may have to switch to a telephone visit.  With either a video or telephone visit, we are not always able to ensure that we have a secure connection.   I need to obtain your verbal consent now.   Are you willing to proceed with your visit today?

## 2021-04-21 NOTE — Telephone Encounter (Signed)
Pt consented to a virtual visit for 04/22/2021. 

## 2021-04-22 ENCOUNTER — Telehealth: Payer: Self-pay | Admitting: Gastroenterology

## 2021-04-22 ENCOUNTER — Telehealth (INDEPENDENT_AMBULATORY_CARE_PROVIDER_SITE_OTHER): Payer: 59 | Admitting: Gastroenterology

## 2021-04-22 ENCOUNTER — Ambulatory Visit: Payer: 59 | Admitting: Gastroenterology

## 2021-04-22 ENCOUNTER — Encounter: Payer: Self-pay | Admitting: Gastroenterology

## 2021-04-22 DIAGNOSIS — K209 Esophagitis, unspecified without bleeding: Secondary | ICD-10-CM | POA: Insufficient documentation

## 2021-04-22 DIAGNOSIS — R059 Cough, unspecified: Secondary | ICD-10-CM

## 2021-04-22 DIAGNOSIS — K2970 Gastritis, unspecified, without bleeding: Secondary | ICD-10-CM | POA: Diagnosis not present

## 2021-04-22 DIAGNOSIS — R1011 Right upper quadrant pain: Secondary | ICD-10-CM | POA: Diagnosis not present

## 2021-04-22 MED ORDER — OMEPRAZOLE 40 MG PO CPDR: 40.0000 mg | DELAYED_RELEASE_CAPSULE | Freq: Two times a day (BID) | ORAL | 5 refills | Status: DC

## 2021-04-22 NOTE — Progress Notes (Addendum)
Primary Care Physician:  Benita Stabile, MD  Primary GI: Dr. Marletta Lor  Patient Location: Home   Provider Location: [facility] office   Reason for Visit: Follow-up   Persons present on the virtual encounter, with roles: Patient and NP   Total time (minutes) spent on medical discussion:12 minutes   Due to COVID-19, visit was conducted using virtual method.  Visit was requested by patient.  Virtual Visit via MyChart Video Note Due to COVID-19, visit is conducted virtually and was requested by patient.   I connected with Patricia [name] on 04/22/21 at  1:30 PM EDT by video and verified that I am speaking with the correct person using two identifiers.   I discussed the limitations, risks, security and privacy concerns of performing an evaluation and management service by telephone and the availability of in person appointments. I also discussed with the patient that there may be a patient responsible charge related to this service. The patient expressed understanding and agreed to proceed.  Chief Complaint  Patient presents with  . 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 EGD completed in interim from last visit with LA Grade A reflux esophagitis, gastritis s/p biopsy, normal duodenum s/p biopsy. Negative H.pylori. negative celiac sprue. Here for follow-up today.   She has noted intermittent RUQ pain. Labs in August by PCP.  Hasn't noticed triggers with foods. Wakes up at times and color of what she ate night before is on pillow. Has never had significant typical symptoms of GERD during day. Pain not always worse after eating. Will be fine for a week or two then out of nowhere will linger for a few hours. No nausea. No vomiting. Eating around 7-8 and goes to bed around 930 or 10 pm. In process of evaluation for sleeve  gastrectomy. States the pain feels just like it did when she had biliary pain prior to cholecystectomy.   Past Medical History:  Diagnosis Date  . Abnormal Papanicolaou smear of cervix with positive human papilloma virus (HPV) test 05/28/2020   Pap +HPV other and [facility], will need colpo per ASCCP guidelines,immediate  risk for CIN 3+ 12.6%   . Asthma   . Cough 08/01/2015  . Fatty liver   . Migraine   . Migraines   . Nausea and vomiting during pregnancy prior to [redacted] weeks gestation 08/01/2015  . PCO (polycystic ovaries) 03/08/2014  . Polycystic disease, ovaries   . Pregnancy induced hypertension   . Pregnant 05/27/2015  . URI (upper respiratory infection) 08/01/2015     Past Surgical History:  Procedure Laterality Date  . BIOPSY  01/20/2021   Procedure: BIOPSY;  Surgeon: Lanelle Bal, DO;  Location: [facility] SU[facility];  Service: Endoscopy;;  . CHOLECYSTECTOMY N/A 06/08/2017   Procedure: LAPAROSCOPIC CHOLECYSTECTOMY;  Surgeon: Franky Macho, MD;  Location: [facility] ORS;  Service: General;  Laterality: N/A;  . ESOPHAGOGASTRODUODENOSCOPY (EGD) WITH PROPOFOL N/A 01/20/2021   LA Grade A reflux esophagitis, gastritis s/p biopsy, normal duodenum s/p biopsy. Negative H.pylori. negative celiac sprue.   . TONSILLECTOMY    . WISDOM TOOTH EXTRACTION       Current Meds  Medication Sig  . albuterol (PROVENTIL HFA) 108 (90 Base) MCG/ACT inhaler Inhale 2 puffs into the lungs every 4 (four) hours as needed for wheezing or  shortness of breath.  Creig Hines DIGIHALER 232 MCG/ACT AEPB Inhale 1 puff into the lungs in the morning and at bedtime.  . cetirizine (ZYRTEC) 10 MG tablet Take 10 mg by mouth daily.  . cholestyramine (QUESTRAN) 4 g packet MIX 1 PACKET AS DIRECTED AND DRINK BY MOUTH TWICE DAILY WITH MEALS (Patient taking differently: Take 4 g by mouth 2 (two) times daily.)  . DUPIXENT 300 MG/2ML SOPN INJECT 300MG  SUBCUTANEOUSLY EVERY OTHER WEEK (Patient taking differently: Inject 300 mg into the skin every 14  (fourteen) days.)  . metFORMIN (GLUCOPHAGE) 500 MG tablet Take 500 mg by mouth 2 (two) times daily with a meal.  . phentermine (ADIPEX-P) 37.5 MG tablet Take 37.5 mg by mouth daily.  . sertraline (ZOLOFT) 50 MG tablet Take 1 tablet (50 mg total) by mouth daily.  . [DISCONTINUED] clobetasol ointment (TEMOVATE) 0.05 % Apply 1 application topically 2 (two) times daily. (Patient taking differently: Apply 1 application topically 2 (two) times daily as needed (irritation).)  . [DISCONTINUED] omeprazole (PRILOSEC) 40 MG capsule Take 1 capsule (40 mg total) by mouth 2 (two) times daily before a meal.     Family History  Problem Relation Age of Onset  . Migraines Sister   . Hypertension Brother   . Heart disease Maternal Grandmother   . Cancer Maternal Grandfather        prostate  . Stroke Paternal Grandmother   . Cancer Paternal Grandfather        prostate  . Stroke Paternal Grandfather   . Atrial fibrillation Father   . Colon cancer Neg Hx   . Allergic rhinitis Neg Hx   . Angioedema Neg Hx   . Asthma Neg Hx   . Atopy Neg Hx   . Eczema Neg Hx   . Urticaria Neg Hx   . Immunodeficiency Neg Hx     Social History   Socioeconomic History  . Marital status: Married    Spouse name: Not on file  . Number of children: Not on file  . Years of education: Not on file  . Highest education level: Not on file  Occupational History  . Not on file  Tobacco Use  . Smoking status: Never Smoker  . Smokeless tobacco: Never Used  Vaping Use  . Vaping Use: Never used  Substance and Sexual Activity  . Alcohol use: Not Currently    Comment: occassional  . Drug use: No  . Sexual activity: Not Currently    Birth control/protection: None  Other Topics Concern  . Not on file  Social History Narrative  . Not on file   Social Determinants of Health   Financial Resource Strain: Low Risk   . Difficulty of Paying Living Expenses: Not hard at all  Food Insecurity: No Food Insecurity  . Worried  About in the Last Year: Never true  . Ran Out of Food in the Last Year: Never true  Transportation Needs: No Transportation Needs  . Lack of Transportation (Medical): No  . Lack of Transportation (Non-Medical): No  Physical Activity: Sufficiently Active  . Days of Exercise per Week: 5 days  . Minutes of Exercise per Session: 30 min  Stress: Stress Concern Present  . Feeling of Stress : Very much  Social Connections: Moderately Integrated  . Frequency of Communication with Friends and Family: More than three times a week  . Frequency of Social Gatherings with Friends and Family: Once a week  . Attends Religious Services: More than  4 times per year  . Active Member of Clubs or Organizations: No  . Attends Banker Meetings: Never  . Marital Status: Married       Review of Systems: Gen: Denies fever, chills, anorexia. Denies fatigue, weakness, weight loss.  CV: Denies chest pain, palpitations, syncope, peripheral edema, and claudication. Resp: Denies dyspnea at rest, cough, wheezing, coughing up blood, and pleurisy. GI: see HPI Derm: Denies rash, itching, dry skin Psych: Denies depression, anxiety, memory loss, confusion. No homicidal or suicidal ideation.  Heme: Denies bruising, bleeding, and enlarged lymph nodes.  Observations/Objective: No distress. Unable to perform physical exam due to video encounter. Pleasant. Maintains eye contact.   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  She has noted continued intermittent RUQ pain, which is described as similar to pain she had prior to cholecystectomy. Labs have been unrevealing in the past. We are requesting most recent labs from Dr. Margo Aye (August?) I am updating an US abdomen. May need MRI/MRCP, as  query occult stone in light of her description.   Continue on omeprazole BID for now. We discussed avoiding eating for at least 3 hours prior to laying down, continuing GERD behavior/diet modification. Levsin prn sent to pharmacy.   Return in 3 months  Follow Up Instructions:    I discussed the assessment and treatment plan with the patient. The patient was provided an opportunity to ask questions and all were answered. The patient agreed with the plan and demonstrated an understanding of the instructions.   The patient was advised to call back or seek an in-person evaluation if the symptoms worsen or if the condition fails to improve as anticipated.  I provided 12 minutes of face-to-face time during this MyChart Video encounter.  Gelene Mink, PhD, ANP-BC [facility]

## 2021-04-22 NOTE — Patient Instructions (Signed)
Continue omeprazole twice a day for now!  We are arranging an ultrasound in near future. You might need further imaging. We are requesting labs from your PCP as well.  We will see you in 3 months!  It was a pleasure to see you today. I want to create trusting relationships with patients to provide genuine, compassionate, and quality care. I value your feedback. If you receive a survey regarding your visit,  I greatly appreciate you taking time to fill this out.   Gelene Mink, PhD, ANP-BC [facility]

## 2021-04-22 NOTE — Telephone Encounter (Signed)
[facility] clinical pool: please arrange RUQ [place] due to abdominal pain. Orders placed.  Darl Pikes: needs office visit in 3 months, and can we request labs  From Dr. Margo Aye? (Last labs on file, [facility]).

## 2021-04-23 NOTE — Telephone Encounter (Signed)
RUQ [place] scheduled for 6/9 at 8:30am, arrival 8:15am, npo midnight  LMVOM and sent mychart message

## 2021-04-24 MED ORDER — HYOSCYAMINE SULFATE 0.125 MG SL SUBL: 0.1250 mg | SUBLINGUAL_TABLET | SUBLINGUAL | 0 refills | Status: DC | PRN

## 2021-04-24 MED ORDER — HYOSCYAMINE SULFATE 0.125 MG SL SUBL: 0.1250 mg | SUBLINGUAL_TABLET | SUBLINGUAL | 1 refills | Status: DC | PRN

## 2021-04-24 NOTE — Addendum Note (Signed)
Addended by: Gelene Mink on: 04/24/2021 07:13 PM   Modules accepted: Orders

## 2021-04-27 NOTE — Telephone Encounter (Signed)
OV MADE, APPT CARD MAILED. REQUESTED LABS FROM PCP

## 2021-04-27 NOTE — Progress Notes (Signed)
CC'ED TO PCP 

## 2021-04-30 ENCOUNTER — Ambulatory Visit (HOSPITAL_COMMUNITY)
Admission: RE | Admit: 2021-04-30 | Discharge: 2021-04-30 | Disposition: A | Discharge status: Home / Self Care | Payer: 59 | Source: Ambulatory Visit | Attending: Gastroenterology | Admitting: Gastroenterology

## 2021-04-30 DIAGNOSIS — R1011 Right upper quadrant pain: Secondary | ICD-10-CM | POA: Diagnosis present

## 2021-05-01 ENCOUNTER — Other Ambulatory Visit: Payer: Self-pay | Admitting: Gastroenterology

## 2021-05-01 DIAGNOSIS — R1011 Right upper quadrant pain: Secondary | ICD-10-CM

## 2021-05-01 MED ORDER — ONDANSETRON 4 MG PO TBDP: 4.0000 mg | ORAL_TABLET | Freq: Three times a day (TID) | ORAL | 1 refills | Status: DC | PRN

## 2021-05-01 NOTE — Progress Notes (Signed)
Orders placed and released for [facility] (CBC, CMP, lipase).  Zofran sent to pharmacy.  Awaiting [place]. May need MRI/MRCP. Patient to go to ED if worsening pain over weekend. See. MyChart message.

## 2021-05-02 LAB — COMPLETE METABOLIC PANEL WITH GFR
AG Ratio: 1.6 (calc) (ref 1.0–2.5)
ALT: 14 U/L (ref 6–29)
AST: 15 U/L (ref 10–30)
Albumin: 3.8 g/dL (ref 3.6–5.1)
Alkaline phosphatase (APISO): 65 U/L (ref 31–125)
BUN: 11 mg/dL (ref 7–25)
CO2: 28 mmol/L (ref 20–32)
Calcium: 9.3 mg/dL (ref 8.6–10.2)
Chloride: 107 mmol/L (ref 98–110)
Creat: 0.71 mg/dL (ref 0.50–1.10)
GFR, Est African American: 132 mL/min/{1.73_m2} (ref >=60)
GFR, Est Non African American: 114 mL/min/{1.73_m2} (ref >=60)
Globulin: 2.4 g/dL (calc) (ref 1.9–3.7)
Glucose, Bld: 101 mg/dL — ABNORMAL HIGH (ref 65–99)
Potassium: 5 mmol/L (ref 3.5–5.3)
Sodium: 143 mmol/L (ref 135–146)
Total Bilirubin: 0.2 mg/dL (ref 0.2–1.2)
Total Protein: 6.2 g/dL (ref 6.1–8.1)

## 2021-05-02 LAB — CBC WITH DIFFERENTIAL/PLATELET
Absolute Monocytes: 644 cells/uL (ref 200–950)
Basophils Absolute: 21 cells/uL (ref 0–200)
Basophils Relative: 0.3 %
Eosinophils Absolute: 147 cells/uL (ref 15–500)
Eosinophils Relative: 2.1 %
HCT: 35 % (ref 35.0–45.0)
Hemoglobin: 11.3 g/dL — ABNORMAL LOW (ref 11.7–15.5)
Lymphs Abs: 2086 cells/uL (ref 850–3900)
MCH: 26 pg — ABNORMAL LOW (ref 27.0–33.0)
MCHC: 32.3 g/dL (ref 32.0–36.0)
MCV: 80.6 fL (ref 80.0–100.0)
MPV: 10.2 fL (ref 7.5–12.5)
Monocytes Relative: 9.2 %
Neutro Abs: 4102 cells/uL (ref 1500–7800)
Neutrophils Relative %: 58.6 %
Platelets: 354 10*3/uL (ref 140–400)
RBC: 4.34 10*6/uL (ref 3.80–5.10)
RDW: 12.8 % (ref 11.0–15.0)
Total Lymphocyte: 29.8 %
WBC: 7 10*3/uL (ref 3.8–10.8)

## 2021-05-02 LAB — LIPASE: Lipase: 23 U/L (ref 7–60)

## 2021-05-05 DIAGNOSIS — D649 Anemia, unspecified: Secondary | ICD-10-CM

## 2021-05-06 NOTE — Telephone Encounter (Signed)
I have placed iron studies to be done by patient and she is aware. I have released this.

## 2021-05-07 LAB — IRON,TIBC AND FERRITIN PANEL
%SAT: 18 % (calc) (ref 16–45)
Ferritin: 36 ng/mL (ref 16–154)
Iron: 54 ug/dL (ref 40–190)
TIBC: 300 mcg/dL (calc) (ref 250–450)

## 2021-05-22 ENCOUNTER — Other Ambulatory Visit: Payer: 59 | Admitting: Adult Health

## 2021-05-26 ENCOUNTER — Ambulatory Visit (INDEPENDENT_AMBULATORY_CARE_PROVIDER_SITE_OTHER): Payer: 59 | Admitting: Adult Health

## 2021-05-26 ENCOUNTER — Other Ambulatory Visit: Payer: Self-pay

## 2021-05-26 ENCOUNTER — Other Ambulatory Visit (HOSPITAL_COMMUNITY)
Admission: RE | Admit: 2021-05-26 | Discharge: 2021-05-26 | Disposition: A | Discharge status: Home / Self Care | Payer: 59 | Source: Ambulatory Visit | Attending: Adult Health | Admitting: Adult Health

## 2021-05-26 ENCOUNTER — Encounter: Payer: Self-pay | Admitting: Adult Health

## 2021-05-26 VITALS — BP 158/97 | HR 78 | Ht 64.25 in | Wt 293.5 lb

## 2021-05-26 DIAGNOSIS — M25551 Pain in right hip: Secondary | ICD-10-CM | POA: Insufficient documentation

## 2021-05-26 DIAGNOSIS — R87611 Atypical squamous cells cannot exclude high grade squamous intraepithelial lesion on cytologic smear of cervix (ASC-H): Secondary | ICD-10-CM | POA: Insufficient documentation

## 2021-05-26 DIAGNOSIS — Z3009 Encounter for other general counseling and advice on contraception: Secondary | ICD-10-CM | POA: Insufficient documentation

## 2021-05-26 DIAGNOSIS — Z01419 Encounter for gynecological examination (general) (routine) without abnormal findings: Secondary | ICD-10-CM | POA: Insufficient documentation

## 2021-05-26 NOTE — Progress Notes (Signed)
Patient ID: Patricia [name], female   DOB: Sep 07, 1991, 30 y.o.   MRN: [id number] History of Present Illness: Patricia [name] is a 30 year old black female, married, [id number] in for well woman gyn exam and pap. She is having sleeve surgery in near future.  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 PCP is Dr Patricia [name].    Current Medications, Allergies, Past Medical History, Past Surgical History, Family History and Social History were reviewed in [employer] record.     Review of Systems: Patient denies any headaches, hearing loss, fatigue, blurred vision, shortness of breath, chest pain, abdominal pain, problems with bowel movements, urination, or intercourse(not active, husband in jail). No joint pain or mood swings. Has pap in right hip into low back, she sits at work, at home.     Physical Exam:BP (!) 158/97 (BP Location: Right Arm, Patient Position: Sitting, Cuff Size: Large)   Pulse 78   Ht 5' 4.25" (1.632 m)   Wt 293 lb 8 oz (133.1 kg)   LMP 05/26/2021   BMI 49.99 kg/m   General:  Well developed, well nourished, no acute distress Skin:  Warm and dry Neck:  Midline trachea, normal thyroid, good ROM, no lymphadenopathy Lungs; Clear to auscultation bilaterally Breast:  No dominant palpable mass, retraction, or nipple discharge Cardiovascular: Regular rate and rhythm Abdomen:  Soft, non tender, no hepatosplenomegaly Pelvic:  External genitalia is normal in appearance, no lesions.  The vagina is normal in appearance.+period blood. Urethra has no lesions or masses. The cervix is bulbous.  Pap with HR HPV genotyping and GC/CHL performed.Uterus is felt to be normal size, shape, and contour.  No adnexal masses or tenderness noted.Bladder is non tender, no masses  felt. Rectal: Deferred. Extremities/musculoskeletal:  No swelling or varicosities noted, no clubbing or cyanosis Psych:  No mood changes, alert and cooperative,seems happy AA is 0  Fall risk is low Depression screen [facility] 2/9 05/26/2021 12/31/2020 10/15/2020  Decreased Interest 0 0 1  Down, Depressed, Hopeless 0 0 0  PHQ - 2 Score 0 0 1  Altered sleeping 0 2 1  Tired, decreased energy 0 1 0  Change in appetite 1 3 0  Feeling bad or failure about yourself  0 0 0  Trouble concentrating 1 2 1   Moving slowly or fidgety/restless 0 0 0  Suicidal thoughts 0 0 0  PHQ-9 Score 2 8 3   Difficult doing work/chores - Somewhat difficult -    GAD 7 : Generalized Anxiety Score 05/26/2021 12/31/2020 10/15/2020 07/15/2020  Nervous, Anxious, on Edge 0 0 0 0  Control/stop worrying 0 0 0 0  Worry too much - different things 0 0 0 0  Trouble relaxing 0 1 1 0  Restless 0 0 0 0  Easily annoyed or irritable 0 0 1 0  Afraid - awful might happen 0 0 0 0  Total GAD 7 Score 0 1 2 0  Anxiety Difficulty - - - Not difficult at all    Examination chaperoned by Tish RN   Impression and Plan: 1. Encounter for gynecological examination with Papanicolaou smear of cervix Pap sent Physical in 1 year Pap in 3 if normal  2. Pap smear of cervix with ASCUS, cannot exclude HGSIL Pap sent   3.  Family planning Pap sent Declines labs, had negative HIV 208  4. Hip pain, right Try elevating feet and if not better see PCP

## 2021-06-04 LAB — CYTOLOGY - PAP
Chlamydia: NEGATIVE
Comment: NEGATIVE
Comment: NEGATIVE
Comment: NEGATIVE
Comment: NORMAL
Diagnosis: UNDETERMINED — AB
HPV 16: NEGATIVE
HPV 18 / 45: NEGATIVE
High risk HPV: POSITIVE — AB
Neisseria Gonorrhea: NEGATIVE

## 2021-06-05 ENCOUNTER — Telehealth: Payer: Self-pay | Admitting: Adult Health

## 2021-06-05 NOTE — Telephone Encounter (Signed)
I spoke with pt she needs colpo, will make appt

## 2021-06-25 ENCOUNTER — Encounter: Payer: Self-pay | Admitting: Obstetrics & Gynecology

## 2021-06-25 ENCOUNTER — Other Ambulatory Visit: Payer: Self-pay

## 2021-06-25 ENCOUNTER — Ambulatory Visit (INDEPENDENT_AMBULATORY_CARE_PROVIDER_SITE_OTHER): Payer: 59 | Admitting: Obstetrics & Gynecology

## 2021-06-25 VITALS — BP 147/100 | HR 90 | Ht 64.0 in | Wt 296.5 lb

## 2021-06-25 DIAGNOSIS — Z3202 Encounter for pregnancy test, result negative: Secondary | ICD-10-CM

## 2021-06-25 DIAGNOSIS — R87810 Cervical high risk human papillomavirus (HPV) DNA test positive: Secondary | ICD-10-CM

## 2021-06-25 DIAGNOSIS — R87610 Atypical squamous cells of undetermined significance on cytologic smear of cervix (ASC-US): Secondary | ICD-10-CM

## 2021-06-25 LAB — POCT URINE PREGNANCY: Preg Test, Ur: NEGATIVE

## 2021-06-25 NOTE — Progress Notes (Signed)
    Colposcopy Procedure Note:    Colposcopy Procedure Note  Indications:  2022 ASCUS/+ HR HPV(negative 16/18) 2021 ASCUS, cannot R/O HSIL   2019 ASCCP recommendation:  Smoker:  No. New sexual partner:  No.    History of abnormal Pap: no  Procedure Details  The risks and benefits of the procedure and Written informed consent obtained.  Speculum placed in vagina and excellent visualization of cervix achieved, cervix swabbed x 3 with acetic acid solution.  Findings: Adequate colposcopy is noted today.  Cervix: no visible lesions, no mosaicism, no punctation, and no abnormal vasculature; SCJ visualized 360 degrees without lesions and no biopsies taken. Vaginal inspection: normal without visible lesions. Vulvar colposcopy: vulvar colposcopy not performed.  Specimens: none  Complications: none.  Colposcopic Impression: Normal Colposcopy  Plan(Based on 2019 ASCCP recommendations) Repeat HPV based cytology 1 year

## 2021-07-24 ENCOUNTER — Other Ambulatory Visit: Payer: Self-pay

## 2021-07-24 MED ORDER — PROAIR HFA 108 (90 BASE) MCG/ACT IN AERS
2.0000 | INHALATION_SPRAY | RESPIRATORY_TRACT | 1 refills | Status: DC | PRN
Start: 2021-07-24 — End: 2021-07-24

## 2021-07-24 MED ORDER — ALBUTEROL SULFATE HFA 108 (90 BASE) MCG/ACT IN AERS: 2.0000 | INHALATION_SPRAY | RESPIRATORY_TRACT | 1 refills | Status: DC | PRN

## 2021-07-24 NOTE — Telephone Encounter (Signed)
Received a fax stating that Proair Respimat is not cover and wanted to know if it can be changed to regular albuterol. Spoke with Dr. Dellis Anes and he stated yes. New prescription has been sent in.

## 2021-07-24 NOTE — Addendum Note (Signed)
Addended by: Dub Mikes on: 07/24/2021 03:37 PM   Modules accepted: Orders

## 2021-08-15 ENCOUNTER — Other Ambulatory Visit: Payer: Self-pay | Admitting: Allergy & Immunology

## 2021-08-22 ENCOUNTER — Other Ambulatory Visit: Payer: Self-pay | Admitting: Allergy & Immunology

## 2021-08-22 HISTORY — PX: OTHER SURGICAL HISTORY: SHX169

## 2021-09-01 ENCOUNTER — Ambulatory Visit: Payer: 59 | Admitting: Gastroenterology

## 2021-09-21 ENCOUNTER — Ambulatory Visit
Admission: EM | Admit: 2021-09-21 | Discharge: 2021-09-21 | Disposition: A | Discharge status: Home / Self Care | Payer: 59 | Attending: Family Medicine | Admitting: Family Medicine

## 2021-09-21 ENCOUNTER — Other Ambulatory Visit: Payer: Self-pay

## 2021-09-21 DIAGNOSIS — S161XXA Strain of muscle, fascia and tendon at neck level, initial encounter: Secondary | ICD-10-CM

## 2021-09-21 HISTORY — DX: Obesity, unspecified: E66.9

## 2021-09-21 MED ORDER — CYCLOBENZAPRINE HCL 10 MG PO TABS: 10.0000 mg | ORAL_TABLET | Freq: Every evening | ORAL | 0 refills | Status: DC | PRN

## 2021-09-21 MED ORDER — NAPROXEN 500 MG PO TABS: 500.0000 mg | ORAL_TABLET | Freq: Two times a day (BID) | ORAL | 0 refills | Status: DC | PRN

## 2021-09-21 NOTE — ED Provider Notes (Signed)
[facility]    CSN: [id number] Arrival date & time: 09/21/21  1019      History   Chief Complaint Chief Complaint  Patient presents with   Neck Pain    HPI Patricia [name] is a 30 y.o. female.   Patient presenting today with sudden onset right-sided neck soreness and stiffness that started overnight.  She states the pain is significantly worse with movement of the head, particularly leaning to the left.  She denies radiation of pain down her arms, recent injury, headache, dizziness, chest pain, shortness of breath, recent illness, dysphagia.  So far has not tried anything over-the-counter for symptoms.  No past history of similar issues.   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 05/28/2020   Pap +HPV other and [facility], will need colpo per ASCCP guidelines,immediate  risk for CIN 3+ 12.6%    Asthma    Cough 08/01/2015   Fatty liver    Migraine    Migraines    Nausea and vomiting during pregnancy prior to [redacted] weeks gestation 08/01/2015   Obesity    PCO (polycystic ovaries) 03/08/2014   Polycystic disease, ovaries    Pregnancy induced hypertension    Pregnant 05/27/2015   URI (upper respiratory infection) 08/01/2015    Patient Active Problem List   Diagnosis Date Noted   Pap smear of cervix with ASCUS, cannot exclude HGSIL 05/26/2021   Hip pain, right 05/26/2021   Family planning 05/26/2021   RUQ pain 04/22/2021   Esophagitis 04/22/2021   Pelvic pain 11/28/2020   Hypertension 07/15/2020   Abnormal Papanicolaou smear of cervix with positive human papilloma virus (HPV) test 05/28/2020   Nexplanon removal 05/20/2020   Encounter for gynecological examination with Papanicolaou smear of cervix 05/20/2020   Depression 05/20/2020   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   Fatty liver 11/29/2019   Diarrhea 09/27/2019   Elevated LFTs 06/17/2017   Calculus of  gallbladder with acute cholecystitis without obstruction    Nausea vomiting and diarrhea    RUQ abdominal pain    Transaminitis    Biliary colic    Gall stones, common bile duct    Abdominal pain 06/04/2017   Chronic hypertension complicating or reason for care during childbirth 04/13/2017   Chronic hypertension in pregnancy 04/13/2017   Chronic hypertension affecting pregnancy 11/08/2016   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   Supervision of high risk pregnancy, antepartum 09/27/2016   History of gestational hypertension 09/27/2016   History of shoulder dystocia in prior pregnancy, currently pregnant 09/27/2016   URI (upper respiratory infection) 08/01/2015   Persistent cough 08/01/2015   Obesity, Class III, BMI 40-49.9 (morbid obesity) (HCC) 09/23/2014   Headache 09/23/2014   PCO (polycystic ovaries) 03/08/2014    Past Surgical History:  Procedure Laterality Date   BIOPSY  01/20/2021   Procedure: BIOPSY;  Surgeon: Lanelle Bal, DO;  [facility];  Service: Endoscopy;;   CHOLECYSTECTOMY N/A 06/08/2017   Procedure: LAPAROSCOPIC CHOLECYSTECTOMY;  Surgeon: Franky Macho, MD;  Location: [facility];  Service: General;  Laterality: N/A;   ESOPHAGOGASTRODUODENOSCOPY (EGD) WITH PROPOFOL N/A 01/20/2021   LA Grade A reflux esophagitis, gastritis s/p biopsy, normal duodenum s/p biopsy. Negative H.pylori. negative celiac sprue.    TONSILLECTOMY     WISDOM TOOTH EXTRACTION      OB History     Gravida  3   Para  2  Term  2   Preterm      AB  1   Living  2      SAB  1   IAB      Ectopic      Multiple  0   Live Births  2            Home Medications    Prior to Admission medications   Medication Sig Start Date End Date Taking? Authorizing Provider  cyclobenzaprine (FLEXERIL) 10 MG tablet Take 1 tablet (10 mg total) by mouth at bedtime as needed for muscle spasms. Do not drink alcohol or drive while taking this medication.  May cause  drowsiness. 09/21/21  Yes Particia Nearing, PA-C  naproxen (NAPROSYN) 500 MG tablet Take 1 tablet (500 mg total) by mouth 2 (two) times daily as needed. 09/21/21  Yes Particia Nearing, PA-C  albuterol [facility]) 108 [phone] [employer]) MCG/ACT inhaler Inhale 2 puffs into the lungs every 4 (four) hours as needed for wheezing or shortness of breath. 07/24/21   Alfonse Spruce, MD  ARMONAIR DIGIHALER 232 MCG/ACT AEPB Inhale 1 puff into the lungs in the morning and at bedtime. 11/20/20   Alfonse Spruce, MD  cetirizine (ZYRTEC) 10 MG tablet Take 10 mg by mouth daily.    [provider]  cholestyramine (QUESTRAN) 4 g packet MIX 1 PACKET AS DIRECTED AND DRINK BY MOUTH TWICE DAILY WITH MEALS Patient taking differently: Take 4 g by mouth 2 (two) times daily. 12/16/20   Letta Median, PA-C  DUPIXENT 300 MG/2ML SOPN INJECT 300MG  SUBCUTANEOUSLY EVERY OTHER WEEK 08/24/21   10/24/21, MD  Lisdexamfetamine Dimesylate (VYVANSE) 50 MG CHEW Chew by mouth daily.    [provider]  metFORMIN (GLUCOPHAGE) 500 MG tablet Take 500 mg by mouth 2 (two) times daily with a meal. 02/02/21   [provider]  omeprazole (PRILOSEC) 40 MG capsule Take 1 capsule (40 mg total) by mouth 2 (two) times daily before a meal. 04/22/21   06/22/21, NP  sertraline (ZOLOFT) 50 MG tablet Take 1 tablet (50 mg total) by mouth daily. 12/31/20   02/28/21, NP    Family History Family History  Problem Relation Age of Onset   Healthy Mother    Atrial fibrillation Father    Migraines Sister    Hypertension Brother    Heart disease Maternal Grandmother    Cancer Maternal Grandfather        prostate   Stroke Paternal Grandmother    Cancer Paternal Grandfather        prostate   Stroke Paternal Grandfather    Colon cancer Neg Hx    Allergic rhinitis Neg Hx    Angioedema Neg Hx    Asthma Neg Hx    Atopy Neg Hx    Eczema Neg Hx    Urticaria Neg Hx    Immunodeficiency Neg Hx      Social History Social History   Tobacco Use   Smoking status: Never   Smokeless tobacco: Never  Vaping Use   Vaping Use: Never used  Substance Use Topics   Alcohol use: Not Currently    Comment: occassional   Drug use: No     Allergies   Peanut-containing drug products, Propranolol, Cephalexin, and Imitrex [sumatriptan]   Review of Systems Review of Systems Per HPI  Physical Exam Triage Vital Signs ED Triage Vitals  Enc Vitals Group     BP 09/21/21 1314 (!)  153/89     Pulse Rate 09/21/21 1314 80     Resp 09/21/21 1314 19     Temp 09/21/21 1314 99.5 F (37.5 C)     Temp Source 09/21/21 1314 Oral     SpO2 09/21/21 1314 98 %     Weight --      Height --      Head Circumference --      Peak Flow --      Pain Score 09/21/21 1308 5     Pain Loc --      Pain Edu? --      Excl. in GC? --    No data found.  Updated Vital Signs BP (!) 153/89 (BP Location: Right Arm)   Pulse 80   Temp 99.5 F (37.5 C) (Oral)   Resp 19   SpO2 98%   Visual Acuity Right Eye Distance:   Left Eye Distance:   Bilateral Distance:    Right Eye Near:   Left Eye Near:    Bilateral Near:     Physical Exam Vitals and nursing note reviewed.  Constitutional:      Appearance: Normal appearance. She is not ill-appearing.  HENT:     Head: Atraumatic.     Mouth/Throat:     Mouth: Mucous membranes are moist.     Pharynx: No oropharyngeal exudate or posterior oropharyngeal erythema.  Eyes:     Extraocular Movements: Extraocular movements intact.     Conjunctiva/sclera: Conjunctivae normal.  Cardiovascular:     Rate and Rhythm: Normal rate and regular rhythm.     Heart sounds: Normal heart sounds.  Pulmonary:     Effort: Pulmonary effort is normal.     Breath sounds: Normal breath sounds. No wheezing or rales.  Musculoskeletal:        General: Tenderness present. No deformity or signs of injury. Normal range of motion.     Cervical back: Normal range of motion and neck supple.      Comments: No midline spinal tenderness to palpation diffusely.  Right trapezius tender to palpation and mild spasm, pain exacerbated with range of motion of the head.  Grip strength full and equal bilateral upper extremities.  Skin:    General: Skin is warm and dry.     Findings: No erythema or rash.  Neurological:     Mental Status: She is alert and oriented to person, place, and time.     Motor: No weakness.     Gait: Gait normal.     Comments: Bilateral upper extremities neurovascularly intact  Psychiatric:        Mood and Affect: Mood normal.        Thought Content: Thought content normal.        Judgment: Judgment normal.     UC Treatments / Results  Labs (all labs ordered are listed, but only abnormal results are displayed) Labs Reviewed - No data to display  EKG   Radiology No results found.  Procedures Procedures (including critical care time)  Medications Ordered in UC Medications - No data to display  Initial Impression / Assessment and Plan / UC Course  I have reviewed the triage vital signs and the nursing notes.  Pertinent labs & imaging results that were available during my care of the patient were reviewed by me and considered in my medical decision making (see chart for details).     Suspect muscular strain, will treat with naproxen, Flexeril, stretches, heat.  Declines work note.  Discussed return precautions for worsening symptoms.  Final Clinical Impressions(s) / UC Diagnoses   Final diagnoses:  Neck strain, initial encounter   Discharge Instructions   None    ED Prescriptions     Medication Sig Dispense Auth. Provider   naproxen (NAPROSYN) 500 MG tablet Take 1 tablet (500 mg total) by mouth 2 (two) times daily as needed. 30 tablet Particia Nearing, [place]   cyclobenzaprine (FLEXERIL) 10 MG tablet Take 1 tablet (10 mg total) by mouth at bedtime as needed for muscle spasms. Do not drink alcohol or drive while taking this medication.   May cause drowsiness. 10 tablet Particia Nearing, [place]      PDMP not reviewed this encounter.   Particia Nearing, [place] 09/21/21 1343

## 2021-09-21 NOTE — ED Triage Notes (Signed)
Pt is here with neck stiffness and pain that started this morning, pt has not taken any meds to relieve discomfort.

## 2021-10-01 ENCOUNTER — Other Ambulatory Visit: Payer: Self-pay | Admitting: Allergy & Immunology

## 2021-10-06 NOTE — Telephone Encounter (Signed)
Please advise to change to alvesco or asmanex

## 2021-10-12 NOTE — Telephone Encounter (Signed)
Please advise to change from armonair to [employer] or asmanex as insurance prefers them

## 2021-10-12 NOTE — Telephone Encounter (Signed)
Can we send to the specialty pharmacy? Is it [facility] or something like that?   Asmanex and Alvesco are not in the same category as AirDuo. You can tell the insurance company that these are stupid substitutions.   [name], MD [facility]

## 2021-10-13 ENCOUNTER — Other Ambulatory Visit: Payer: Self-pay

## 2021-10-13 ENCOUNTER — Ambulatory Visit
Admission: EM | Admit: 2021-10-13 | Discharge: 2021-10-13 | Disposition: A | Discharge status: Home / Self Care | Payer: 59 | Attending: Student | Admitting: Student

## 2021-10-13 ENCOUNTER — Ambulatory Visit (INDEPENDENT_AMBULATORY_CARE_PROVIDER_SITE_OTHER): Payer: 59

## 2021-10-13 DIAGNOSIS — R059 Cough, unspecified: Secondary | ICD-10-CM | POA: Diagnosis not present

## 2021-10-13 DIAGNOSIS — J069 Acute upper respiratory infection, unspecified: Secondary | ICD-10-CM

## 2021-10-13 LAB — POCT RAPID STREP A (OFFICE): Rapid Strep A Screen: NEGATIVE

## 2021-10-13 MED ORDER — NYSTATIN 100000 UNIT/ML MT SUSP: 500000.0000 [IU] | Freq: Four times a day (QID) | OROMUCOSAL | 0 refills | Status: AC

## 2021-10-13 NOTE — ED Triage Notes (Signed)
Patient presents to Urgent Care with complaints of sore throat x 3 days. She reports using an inhaler so concerned with thrush. Treating symptoms with lidocaine.

## 2021-10-13 NOTE — Discharge Instructions (Addendum)
-  Your xray looks okay today. Continue using the Flovent inhaler and spirometer.  -Nystatin mouthwash x7 days -Given your throat pain after surgery- please follow-up with your surgeon! I can only see the top part of your throat-  I'm not able to evaluate your sore throat to make sure it's not a surgery complication -Shortness of breath getting worse- head to the ED

## 2021-10-13 NOTE — ED Provider Notes (Signed)
[facility]    CSN: [id number] Arrival date & time: 10/13/21  0806      History   Chief Complaint Chief Complaint  Patient presents with   Sore Throat    HPI Patricia [name] is a 30 y.o. female presenting with concern for oral thrush following throat surgery that occurred 6 days ago.  Medical history GERD.  This patient had a throat surgery for GERD about 6 days ago, has not followed with her surgeon since then.  She states that her lungs "collapsed" during the surgery, upon chart review this is in fact not correct, in actuality she had bilateral atelectasis and was provided with Flovent inhaler and spirometer.  Denies shortness of breath, chest pain, fever/chills.  She did develop a cough few days ago, this is nonproductive.  Denies recent antibiotics.  She is concerned about tongue whiteness and upper sore throat.  Denies nausea/vomiting/diarrhea. Exposure to son who has a virus.     HPI  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 05/28/2020   Pap +HPV other and [facility], will need colpo per ASCCP guidelines,immediate  risk for CIN 3+ 12.6%    Asthma    Cough 08/01/2015   Fatty liver    Migraine    Migraines    Nausea and vomiting during pregnancy prior to [redacted] weeks gestation 08/01/2015   Obesity    PCO (polycystic ovaries) 03/08/2014   Polycystic disease, ovaries    Pregnancy induced hypertension    Pregnant 05/27/2015   URI (upper respiratory infection) 08/01/2015    Patient Active Problem List   Diagnosis Date Noted   Pap smear of cervix with ASCUS, cannot exclude HGSIL 05/26/2021   Hip pain, right 05/26/2021   Family planning 05/26/2021   RUQ pain 04/22/2021   Esophagitis 04/22/2021   Pelvic pain 11/28/2020   Hypertension 07/15/2020   Abnormal Papanicolaou smear of cervix with positive human papilloma virus (HPV) test 05/28/2020   Nexplanon removal 05/20/2020   Encounter  for gynecological examination with Papanicolaou smear of cervix 05/20/2020   Depression 05/20/2020   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   Fatty liver 11/29/2019   Diarrhea 09/27/2019   Elevated LFTs 06/17/2017   Calculus of gallbladder with acute cholecystitis without obstruction    Nausea vomiting and diarrhea    RUQ abdominal pain    Transaminitis    Biliary colic    Gall stones, common bile duct    Abdominal pain 06/04/2017   Chronic hypertension complicating or reason for care during childbirth 04/13/2017   Chronic hypertension in pregnancy 04/13/2017   Chronic hypertension affecting pregnancy 11/08/2016   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   Supervision of high risk pregnancy, antepartum 09/27/2016   History of gestational hypertension 09/27/2016   History of shoulder dystocia in prior pregnancy, currently pregnant 09/27/2016   URI (upper respiratory infection) 08/01/2015   Persistent cough 08/01/2015   Obesity, Class III, BMI 40-49.9 (morbid obesity) ([place]) 09/23/2014   Headache 09/23/2014   PCO (polycystic ovaries) 03/08/2014    Past Surgical History:  Procedure Laterality Date   BIOPSY  01/20/2021   Procedure: BIOPSY;  Surgeon: Eloise Harman, DO;  [facility];  Service: Endoscopy;;   CHOLECYSTECTOMY N/A 06/08/2017   Procedure: LAPAROSCOPIC CHOLECYSTECTOMY;  Surgeon: Aviva Signs, MD;  Location: [facility];  Service: General;  Laterality: N/A;   ESOPHAGOGASTRODUODENOSCOPY (EGD) WITH PROPOFOL N/A 01/20/2021  LA Grade A reflux esophagitis, gastritis s/p biopsy, normal duodenum s/p biopsy. Negative H.pylori. negative celiac sprue.    TONSILLECTOMY     WISDOM TOOTH EXTRACTION      OB History     Gravida  3   Para  2   Term  2   Preterm      AB  1   Living  2      SAB  1   IAB      Ectopic      Multiple  0   Live Births  2            Home Medications    Prior to Admission medications    Medication Sig Start Date End Date Taking? Authorizing Provider  nystatin (MYCOSTATIN) 100000 UNIT/ML suspension Take 5 mLs (500,000 Units total) by mouth 4 (four) times daily for 7 days. 10/13/21 10/20/21 Yes Hazel Sams, PA-C  albuterol [facility] HFA) 108 (90 Base) MCG/ACT inhaler Inhale 2 puffs into the lungs every 4 (four) hours as needed for wheezing or shortness of breath. 07/24/21   Valentina Shaggy, MD  ARMONAIR DIGIHALER 232 MCG/ACT AEPB Inhale 1 puff into the lungs in the morning and at bedtime. 11/20/20   Valentina Shaggy, MD  cetirizine (ZYRTEC) 10 MG tablet Take 10 mg by mouth daily.    [provider]  cholestyramine (QUESTRAN) 4 g packet MIX 1 PACKET AS DIRECTED AND DRINK BY MOUTH TWICE DAILY WITH MEALS Patient taking differently: Take 4 g by mouth 2 (two) times daily. 12/16/20   Erenest Rasher, PA-C  cyclobenzaprine (FLEXERIL) 10 MG tablet Take 1 tablet (10 mg total) by mouth at bedtime as needed for muscle spasms. Do not drink alcohol or drive while taking this medication.  May cause drowsiness. 09/21/21   Volney American, PA-C  DUPIXENT 300 MG/2ML SOPN INJECT 300MG  SUBCUTANEOUSLY EVERY OTHER WEEK 08/24/21   Valentina Shaggy, MD  Lisdexamfetamine Dimesylate (VYVANSE) 50 MG CHEW Chew by mouth daily.    [provider]  metFORMIN (GLUCOPHAGE) 500 MG tablet Take 500 mg by mouth 2 (two) times daily with a meal. 02/02/21   [provider]  naproxen (NAPROSYN) 500 MG tablet Take 1 tablet (500 mg total) by mouth 2 (two) times daily as needed. 09/21/21   Volney American, PA-C  omeprazole (PRILOSEC) 40 MG capsule Take 1 capsule (40 mg total) by mouth 2 (two) times daily before a meal. 04/22/21   Annitta Needs, NP  sertraline (ZOLOFT) 50 MG tablet Take 1 tablet (50 mg total) by mouth daily. 12/31/20   Estill Dooms, NP    Family History Family History  Problem Relation Age of Onset   Healthy Mother    Atrial fibrillation Father     Migraines Sister    Hypertension Brother    Heart disease Maternal Grandmother    Cancer Maternal Grandfather        prostate   Stroke Paternal Grandmother    Cancer Paternal Grandfather        prostate   Stroke Paternal Grandfather    Colon cancer Neg Hx    Allergic rhinitis Neg Hx    Angioedema Neg Hx    Asthma Neg Hx    Atopy Neg Hx    Eczema Neg Hx    Urticaria Neg Hx    Immunodeficiency Neg Hx     Social History Social History   Tobacco Use   Smoking status: Never  Smokeless tobacco: Never  Vaping Use   Vaping Use: Never used  Substance Use Topics   Alcohol use: Not Currently    Comment: occassional   Drug use: No     Allergies   Peanut-containing drug products, Propranolol, Cephalexin, and Imitrex [sumatriptan]   Review of Systems Review of Systems  Constitutional:  Negative for appetite change, chills and fever.  HENT:  Positive for congestion and sore throat. Negative for ear pain, rhinorrhea, sinus pressure and sinus pain.   Eyes:  Negative for redness and visual disturbance.  Respiratory:  Positive for cough. Negative for chest tightness, shortness of breath and wheezing.   Cardiovascular:  Negative for chest pain and palpitations.  Gastrointestinal:  Negative for abdominal pain, constipation, diarrhea, nausea and vomiting.  Genitourinary:  Negative for dysuria, frequency and urgency.  Musculoskeletal:  Negative for myalgias.  Neurological:  Negative for dizziness, weakness and headaches.  Psychiatric/Behavioral:  Negative for confusion.   All other systems reviewed and are negative.   Physical Exam Triage Vital Signs ED Triage Vitals  Enc Vitals Group     BP 10/13/21 0830 (!) 152/100     Pulse Rate 10/13/21 0830 83     Resp 10/13/21 0830 16     Temp 10/13/21 0830 98.8 F (37.1 C)     Temp Source 10/13/21 0830 Oral     SpO2 10/13/21 0830 98 %     Weight --      Height --      Head Circumference --      Peak Flow --      Pain Score  10/13/21 0829 7     Pain Loc --      Pain Edu? --      Excl. in GC? --    No data found.  Updated Vital Signs BP (!) 152/100 (BP Location: Right Arm)   Pulse 83   Temp 98.8 F (37.1 C) (Oral)   Resp 16   LMP 09/23/2021   SpO2 98%   Visual Acuity Right Eye Distance:   Left Eye Distance:   Bilateral Distance:    Right Eye Near:   Left Eye Near:    Bilateral Near:     Physical Exam Vitals reviewed.  Constitutional:      General: She is not in acute distress.    Appearance: Normal appearance. She is not ill-appearing.  HENT:     Head: Normocephalic and atraumatic.     Right Ear: Tympanic membrane, ear canal and external ear normal. No tenderness. No middle ear effusion. There is no impacted cerumen. Tympanic membrane is not perforated, erythematous, retracted or bulging.     Left Ear: Tympanic membrane, ear canal and external ear normal. No tenderness.  No middle ear effusion. There is no impacted cerumen. Tympanic membrane is not perforated, erythematous, retracted or bulging.     Nose: Nose normal. No congestion.     Mouth/Throat:     Mouth: Mucous membranes are moist.     Pharynx: Uvula midline. No oropharyngeal exudate or posterior oropharyngeal erythema.     Tonsils: No tonsillar exudate.     Comments: Tongue is white. There are no white patches or friability. Tonsils not visible. On exam, uvula is midline, she is tolerating her secretions without difficulty, there is no trismus, no drooling, she has normal phonation  Eyes:     Extraocular Movements: Extraocular movements intact.     Pupils: Pupils are equal, round, and reactive to light.  Cardiovascular:  Rate and Rhythm: Normal rate and regular rhythm.     Heart sounds: Normal heart sounds.  Pulmonary:     Effort: Pulmonary effort is normal.     Breath sounds: Normal breath sounds. No decreased breath sounds, wheezing, rhonchi or rales.     Comments: Frequent cough Abdominal:     Palpations: Abdomen is soft.      Tenderness: There is no abdominal tenderness. There is no guarding or rebound.  Lymphadenopathy:     Cervical: No cervical adenopathy.     Right cervical: No superficial cervical adenopathy.    Left cervical: No superficial cervical adenopathy.  Neurological:     General: No focal deficit present.     Mental Status: She is alert and oriented to person, place, and time.  Psychiatric:        Mood and Affect: Mood normal.        Behavior: Behavior normal.        Thought Content: Thought content normal.        Judgment: Judgment normal.     UC Treatments / Results  Labs (all labs ordered are listed, but only abnormal results are displayed) Labs Reviewed  CULTURE, GROUP A STREP [facility])  POCT RAPID STREP A (OFFICE)    EKG   Radiology DG Chest 2 View  Result Date: 10/13/2021 CLINICAL DATA:  Cough EXAM: CHEST - 2 VIEW COMPARISON:  Previous studies including the examination of 10/08/2020 FINDINGS: Cardiac size is within normal limits. There are no signs of pulmonary edema or focal pulmonary consolidation. There is no pleural effusion or pneumothorax. IMPRESSION: No active cardiopulmonary disease. Electronically Signed   By: Ernie AvenaPalani  Rathinasamy M.D.   On: 10/13/2021 08:55    Procedures Procedures (including critical care time)  Medications Ordered in UC Medications - No data to display  Initial Impression / Assessment and Plan / UC Course  I have reviewed the triage vital signs and the nursing notes.  Pertinent labs & imaging results that were available during my care of the patient were reviewed by me and considered in my medical decision making (see chart for details).     This patient is a very pleasant 30 y.o. year old female presenting with viral URI and white tongue 6 days following throat surgery. Today this pt is afebrile nontachycardic nontachypneic, oxygenating well on room air, no wheezes rhonchi or rales. Last antipyretic 2 days ago. She has not followed with her  surgeon for her throat pain. States she is not pregnant or breastfeeding.  Rapid strep negative, culture sent. Tongue appears geographic in nature, but will send nystatin suspension as below at patient preference.   CXR checked given patient concern for "collapsed lungs" (this is actually atelectasis per previous CXR): this was normal.  PLEASE follow-up with surgery given continued throat pain. Strict ED return precautions discussed. Patient verbalizes understanding and agreement.   Coding Level 4 for review of past notes/labs, order and interpretation of labs today, and prescription drug management   Final Clinical Impressions(s) / UC Diagnoses   Final diagnoses:  Viral URI with cough  Viral upper respiratory tract infection     Discharge Instructions      -Your xray looks okay today. Continue using the Flovent inhaler and spirometer.  -Nystatin mouthwash x7 days -Given your throat pain after surgery- please follow-up with your surgeon! I can only see the top part of your throat-  I'm not able to evaluate your sore throat to make sure it's not a surgery  complication -Shortness of breath getting worse- head to the ED     ED Prescriptions     Medication Sig Dispense Auth. Provider   nystatin (MYCOSTATIN) 100000 UNIT/ML suspension Take 5 mLs (500,000 Units total) by mouth 4 (four) times daily for 7 days. 60 mL Hazel Sams, PA-C      PDMP not reviewed this encounter.   Hazel Sams, PA-C 10/13/21 [phone]

## 2021-10-14 ENCOUNTER — Encounter: Payer: Self-pay | Admitting: Internal Medicine

## 2021-10-14 ENCOUNTER — Ambulatory Visit (INDEPENDENT_AMBULATORY_CARE_PROVIDER_SITE_OTHER): Payer: 59 | Admitting: Internal Medicine

## 2021-10-14 VITALS — BP 154/93 | HR 99 | Temp 97.0°F | Ht 64.0 in | Wt 286.8 lb

## 2021-10-14 DIAGNOSIS — R197 Diarrhea, unspecified: Secondary | ICD-10-CM | POA: Diagnosis not present

## 2021-10-14 DIAGNOSIS — Z6841 Body Mass Index (BMI) 40.0 and over, adult: Secondary | ICD-10-CM

## 2021-10-14 DIAGNOSIS — K2100 Gastro-esophageal reflux disease with esophagitis, without bleeding: Secondary | ICD-10-CM

## 2021-10-14 DIAGNOSIS — K760 Fatty (change of) liver, not elsewhere classified: Secondary | ICD-10-CM

## 2021-10-14 NOTE — Progress Notes (Signed)
Referring Provider: Benita Stabile, MD Primary Care Physician:  Benita Stabile, MD Primary GI:  Dr. Marletta Lor  Chief Complaint  Patient presents with   Follow-up    TIF done last week at [facility]. Scheduled for gastric sleeve next month    HPI:   Patricia [name] is a 30 y.o. female who presents to clinic today for follow-up visit.  Has a history of chronic GERD.  EGD 01/20/2021 showed LA grade a esophagitis and gastritis.  Biopsies negative for H. pylori.  He subsequently underwent esophageal manometry which showed findings of potential esophageal outflow obstruction.  Normal peristalsis.  pH testing positive for acid reflux.  She recently underwent fundoplication and states she is doing well.  Still on a soft diet.  She has plans for sleeve gastrectomy next month given her obesity.  Has been dealing with some intermittent right upper quadrant abdominal pain.  Ultrasound 04/30/2021 unremarkable aside fatty liver.  She is status post cholecystectomy 3 years ago.  Has had issues with diarrhea since and is currently maintained on Questran which helps.  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 05/28/2020   Pap +HPV other and [facility], will need colpo per ASCCP guidelines,immediate  risk for CIN 3+ 12.6%    Asthma    Cough 08/01/2015   Fatty liver    Migraine    Migraines    Nausea and vomiting during pregnancy prior to [redacted] weeks gestation 08/01/2015   Obesity    PCO (polycystic ovaries) 03/08/2014   Polycystic disease, ovaries    Pregnancy induced hypertension    Pregnant 05/27/2015   URI (upper respiratory infection) 08/01/2015    Past Surgical History:  Procedure Laterality Date   BIOPSY  01/20/2021   Procedure: BIOPSY;  Surgeon: Lanelle Bal, DO;  Location: [facility];  Service: Endoscopy;;   CHOLECYSTECTOMY N/A 06/08/2017   Procedure: LAPAROSCOPIC CHOLECYSTECTOMY;  Surgeon: Franky Macho, MD;  Location:  [facility] ORS;  Service: General;  Laterality: N/A;   ESOPHAGOGASTRODUODENOSCOPY (EGD) WITH PROPOFOL N/A 01/20/2021   LA Grade A reflux esophagitis, gastritis s/p biopsy, normal duodenum s/p biopsy. Negative H.pylori. negative celiac sprue.    TONSILLECTOMY     WISDOM TOOTH EXTRACTION      Current Outpatient Medications  Medication Sig Dispense Refill   albuterol (PROAIR HFA) 108 (90 Base) MCG/ACT inhaler Inhale 2 puffs into the lungs every 4 (four) hours as needed for wheezing or shortness of breath. 18 each 1   cetirizine (ZYRTEC) 10 MG tablet Take 10 mg by mouth daily.     cholestyramine (QUESTRAN) 4 g packet MIX 1 PACKET AS DIRECTED AND DRINK BY MOUTH TWICE DAILY WITH MEALS (Patient taking differently: Take 4 g by mouth 2 (two) times daily.) 60 each 5   DUPIXENT 300 MG/2ML SOPN INJECT 300MG  SUBCUTANEOUSLY EVERY OTHER WEEK 4 mL 11   fluticasone (FLOVENT HFA) 220 MCG/ACT inhaler Inhale 2 puffs into the lungs 2 (two) times daily.     Lisdexamfetamine Dimesylate (VYVANSE) 50 MG CHEW Chew by mouth daily.     metFORMIN (GLUCOPHAGE) 500 MG tablet Take 500 mg by mouth 2 (two) times daily with a meal.     nystatin (MYCOSTATIN) 100000 UNIT/ML suspension Take 5 mLs (500,000 Units total) by mouth 4 (four) times daily for 7 days. 60 mL 0   PANTOPRAZOLE SODIUM PO Take by mouth. 20 ML's twice a day     sertraline (  ZOLOFT) 50 MG tablet Take 1 tablet (50 mg total) by mouth daily. 30 tablet 6   ARMONAIR DIGIHALER 232 MCG/ACT AEPB Inhale 1 puff into the lungs in the morning and at bedtime. (Patient not taking: Reported on 10/14/2021) 1 each 5   cyclobenzaprine (FLEXERIL) 10 MG tablet Take 1 tablet (10 mg total) by mouth at bedtime as needed for muscle spasms. Do not drink alcohol or drive while taking this medication.  May cause drowsiness. (Patient not taking: Reported on 10/14/2021) 10 tablet 0   naproxen (NAPROSYN) 500 MG tablet Take 1 tablet (500 mg total) by mouth 2 (two) times daily as needed. (Patient not  taking: Reported on 10/14/2021) 30 tablet 0   omeprazole (PRILOSEC) 40 MG capsule Take 1 capsule (40 mg total) by mouth 2 (two) times daily before a meal. (Patient not taking: Reported on 10/14/2021) 60 capsule 5   No current facility-administered medications for this visit.    Allergies as of 10/14/2021 - Review Complete 10/14/2021  Allergen Reaction Noted   Peanut-containing drug products Anaphylaxis and Hives 04/27/2012   Propranolol Shortness Of Breath 09/23/2014   Cephalexin Hives 12/22/2011   Imitrex [sumatriptan] Hives 10/12/2013    Family History  Problem Relation Age of Onset   Healthy Mother    Atrial fibrillation Father    Migraines Sister    Hypertension Brother    Heart disease Maternal Grandmother    Cancer Maternal Grandfather        prostate   Stroke Paternal Grandmother    Cancer Paternal Grandfather        prostate   Stroke Paternal Grandfather    Colon cancer Neg Hx    Allergic rhinitis Neg Hx    Angioedema Neg Hx    Asthma Neg Hx    Atopy Neg Hx    Eczema Neg Hx    Urticaria Neg Hx    Immunodeficiency Neg Hx     Social History   Socioeconomic History   Marital status: Married    Spouse name: Not on file   Number of children: Not on file   Years of education: Not on file   Highest education level: Not on file  Occupational History   Not on file  Tobacco Use   Smoking status: Never   Smokeless tobacco: Never  Vaping Use   Vaping Use: Never used  Substance and Sexual Activity   Alcohol use: Not Currently    Comment: occassional   Drug use: No   Sexual activity: Not Currently    Birth control/protection: None  Other Topics Concern   Not on file  Social History Narrative   Not on file   Social Determinants of Health   Financial Resource Strain: Low Risk    Difficulty of Paying Living Expenses: Not hard at all  Food Insecurity: No Food Insecurity   Worried About Programme researcher, broadcasting/film/video in the Last Year: Never true   Ran Out of Food in the  Last Year: Never true  Transportation Needs: No Transportation Needs   Lack of Transportation (Medical): No   Lack of Transportation (Non-Medical): No  Physical Activity: Sufficiently Active   Days of Exercise per Week: 5 days   Minutes of Exercise per Session: 40 min  Stress: No Stress Concern Present   Feeling of Stress : Not at all  Social Connections: Socially Integrated   Frequency of Communication with Friends and Family: More than three times a week   Frequency of Social Gatherings with Friends  and Family: More than three times a week   Attends Religious Services: More than 4 times per year   Active Member of Clubs or Organizations: Yes   Attends Engineer, structural: More than 4 times per year   Marital Status: Married    Subjective: Review of Systems  Constitutional:  Negative for chills and fever.  HENT:  Negative for congestion and hearing loss.   Eyes:  Negative for blurred vision and double vision.  Respiratory:  Negative for cough and shortness of breath.   Cardiovascular:  Negative for chest pain and palpitations.  Gastrointestinal:  Negative for abdominal pain, blood in stool, constipation, diarrhea, heartburn, melena and vomiting.  Genitourinary:  Negative for dysuria and urgency.  Musculoskeletal:  Negative for joint pain and myalgias.  Skin:  Negative for itching and rash.  Neurological:  Negative for dizziness and headaches.  Psychiatric/Behavioral:  Negative for depression. The patient is not nervous/anxious.     Objective: BP (!) 154/93   Pulse 99   Temp (!) 97 F (36.1 C)   Ht 5\' 4"  (1.626 m)   Wt 286 lb 12.8 oz (130.1 kg)   LMP 09/23/2021   BMI 49.23 kg/m  Physical Exam Constitutional:      Appearance: Normal appearance. She is obese.  HENT:     Head: Normocephalic and atraumatic.  Eyes:     Extraocular Movements: Extraocular movements intact.     Conjunctiva/sclera: Conjunctivae normal.  Cardiovascular:     Rate and Rhythm: Normal  rate and regular rhythm.  Pulmonary:     Effort: Pulmonary effort is normal.     Breath sounds: Normal breath sounds.  Abdominal:     General: Bowel sounds are normal.     Palpations: Abdomen is soft.  Musculoskeletal:        General: No swelling. Normal range of motion.     Cervical back: Normal range of motion and neck supple.  Skin:    General: Skin is warm and dry.     Coloration: Skin is not jaundiced.  Neurological:     General: No focal deficit present.     Mental Status: She is alert and oriented to person, place, and time.  Psychiatric:        Mood and Affect: Mood normal.        Behavior: Behavior normal.     Assessment: *GERD *Nonalcoholic fatty liver disease *Obesity *Bile acid diarrhea  Plan: Patient's GERD well-controlled on pantoprazole twice daily.  She is status post recent fundoplication.  Continue on PPI therapy.  Discussed fatty liver disease in depth with her today.  No history of diabetes or dyslipidemia.  Does have obesity.  Recommend 1-2# weight loss per week until ideal body weight through exercise & diet. Low fat/cholesterol diet.   Avoid sweets, sodas, fruit juices, sweetened beverages like tea, etc. Gradually increase exercise from 15 min daily up to 1 hr per day 5 days/week. Limit alcohol use.  Plans for sleeve gastrectomy next month.  Hopefully this will aid in her weight loss.  We will continue to monitor.  Continue on Questran for bile acid diarrhea.  Follow-up in 6 months or sooner if needed.   10/14/2021 1:11 PM   Disclaimer: This note was dictated with voice recognition software. Similar sounding words can inadvertently be transcribed and may not be corrected upon review.

## 2021-10-14 NOTE — Patient Instructions (Signed)
I am happy to hear that you are doing well.  Continue on pantoprazole.  Continue on Questran.  We can consider trialing you off the Questran after you have your surgery.  Your ultrasound showed fatty liver.  The most important thing you can do is live a healthy lifestyle and lose weight.  We will continue to monitor this.  Follow-up with GI in 6 months.  I hope you have a great Thanksgiving and Christmas.  Dr. Marletta Lor  Nonalcoholic Fatty Liver Disease Diet, Adult Nonalcoholic fatty liver disease is a condition that causes fat to build up in and around the liver. The disease makes it harder for the liver to work the way that it should. Following a healthy diet can help to keep nonalcoholic fatty liver disease under control. It can also help to prevent or improve conditions that are associated with the disease, such as heart disease, diabetes, high blood pressure, and abnormal cholesterol levels. Along with regular exercise, this diet: Promotes weight loss. Helps to control blood sugar levels. Helps to improve the way that the body uses insulin. What are tips for following this plan? Reading food labels  Always check food labels for: The amount of saturated fat in a food. You should limit your intake of saturated fat. Saturated fat is found in foods that come from animals, including meat and dairy products such as butter, cheese, and whole milk. The amount of fiber in a food. You should choose high-fiber foods such as fruits, vegetables, and whole grains. Try to get 25-30 grams (g) of fiber a day.   Cooking When cooking, use heart-healthy oils that are high in monounsaturated fats. These include olive oil, canola oil, and avocado oil. Limit frying or deep-frying foods. Cook foods using healthy methods such as baking, boiling, steaming, and grilling instead. Meal planning You may want to keep track of how many calories you take in. Eating the right amount of calories will help you achieve a  healthy weight. Meeting with a registered dietitian can help you get started. Limit how often you eat takeout and fast food. These foods are usually very high in fat, salt, and sugar. Use the glycemic index (GI) to plan your meals. The index tells you how quickly a food will raise your blood sugar. Choose low-GI foods (GI less than 55). These foods take a longer time to raise blood sugar. A registered dietitian can help you identify foods lower on the GI scale. Lifestyle You may want to follow a Mediterranean diet. This diet includes a lot of vegetables, lean meats or fish, whole grains, fruits, and healthy oils and fats. What foods can I eat?    Fruits Bananas. Apples. Oranges. Grapes. Papaya. Mango. Pomegranate. Kiwi. Grapefruit. Cherries. Vegetables Lettuce. Spinach. Peas. Beets. Cauliflower. Cabbage. Broccoli. Carrots. Tomatoes. Squash. Eggplant. Herbs. Peppers. Onions. Cucumbers. Brussels sprouts. Yams and sweet potatoes. Beans. Lentils. Grains Whole wheat or whole-grain foods, including breads, crackers, cereals, and pasta. Stone-ground whole wheat. Unsweetened oatmeal. Bulgur. Barley. Quinoa. Brown or wild rice. Corn or whole wheat flour tortillas. Meats and other proteins Lean meats. Poultry. Tofu. Seafood and shellfish. Dairy Low-fat or fat-free dairy products, such as yogurt, cottage cheese, or cheese. Beverages Water. Sugar-free drinks. Tea. Coffee. Low-fat or skim milk. Milk alternatives, such as soy or almond milk. Real fruit juice. Fats and oils Avocado. Canola or olive oil. Nuts and nut butters. Seeds. Seasonings and condiments Mustard. Relish. Low-fat, low-sugar ketchup and barbecue sauce. Low-fat or fat-free mayonnaise. Sweets and desserts Sugar-free  sweets. The items listed above may not be a complete list of foods and beverages you can eat. Contact a dietitian for more information. What foods should I limit or avoid? Meats and other proteins Limit red meat to 1-2 times  a week. Dairy [employer]. Fats and oils Palm oil and coconut oil. Fried foods. Other foods Processed foods. Foods that contain a lot of salt or sodium. Sweets and desserts Sweets that contain sugar. Beverages Sweetened drinks, such as sweet tea, milkshakes, iced sweet drinks, and sodas. Alcohol. The items listed above may not be a complete list of foods and beverages you should avoid. Contact a dietitian for more information. Where to find more information The [employer] of Diabetes and Digestive and Kidney Diseases: [email or web address] Summary Nonalcoholic fatty liver disease is a condition that causes fat to build up in and around the liver. Following a healthy diet can help to keep nonalcoholic fatty liver disease under control. Your diet should be rich in fruits, vegetables, whole grains, and lean proteins. Limit your intake of saturated fat. Saturated fat is found in foods that come from animals, including meat and dairy products such as butter, cheese, and whole milk. This diet promotes weight loss, helps to control blood sugar levels, and helps to improve the way that the body uses insulin. This information is not intended to replace advice given to you by your health care provider. Make sure you discuss any questions you have with your health care provider. Document Revised: 03/02/2019 Document Reviewed: 11/30/2018 Elsevier Patient Education  2020 [employer].  At [facility] we value your feedback. You may receive a survey about your visit today. Please share your experience as we strive to create trusting relationships with our patients to provide genuine, compassionate, quality care.  We appreciate your understanding and patience as we review any laboratory studies, imaging, and other diagnostic tests that are ordered as we care for you. Our office policy is 5 business days for review of these results, and any emergent or urgent results are addressed in a  timely manner for your best interest. If you do not hear from our office in 1 week, please contact us.   We also encourage the use of MyChart, which contains your medical information for your review as well. If you are not enrolled in this feature, an access code is on this after visit summary for your convenience. Thank you for allowing [place] to be involved in your care.  It was great to see you today!  I hope you have a great rest of your Fall!    Hennie Duos. Marletta Lor, D.O. Gastroenterology and Hepatology [facility]

## 2021-10-16 LAB — CULTURE, GROUP A STREP (THRC)

## 2022-01-18 ENCOUNTER — Ambulatory Visit
Admission: EM | Admit: 2022-01-18 | Discharge: 2022-01-18 | Disposition: A | Discharge status: Home / Self Care | Payer: No Typology Code available for payment source | Attending: Urgent Care | Admitting: Urgent Care

## 2022-01-18 DIAGNOSIS — R070 Pain in throat: Secondary | ICD-10-CM | POA: Diagnosis not present

## 2022-01-18 DIAGNOSIS — R509 Fever, unspecified: Secondary | ICD-10-CM | POA: Insufficient documentation

## 2022-01-18 DIAGNOSIS — J069 Acute upper respiratory infection, unspecified: Secondary | ICD-10-CM | POA: Insufficient documentation

## 2022-01-18 DIAGNOSIS — R52 Pain, unspecified: Secondary | ICD-10-CM | POA: Diagnosis not present

## 2022-01-18 DIAGNOSIS — J4530 Mild persistent asthma, uncomplicated: Secondary | ICD-10-CM | POA: Insufficient documentation

## 2022-01-18 LAB — POCT RAPID STREP A (OFFICE): Rapid Strep A Screen: NEGATIVE

## 2022-01-18 MED ORDER — ACETAMINOPHEN 325 MG PO TABS
650.0000 mg | ORAL_TABLET | Freq: Once | ORAL | Status: AC
  Administered 2022-01-18: 650 mg via ORAL

## 2022-01-18 MED ORDER — PSEUDOEPHEDRINE HCL 60 MG PO TABS: 60.0000 mg | ORAL_TABLET | Freq: Three times a day (TID) | ORAL | 0 refills | Status: DC | PRN

## 2022-01-18 MED ORDER — BENZONATATE 100 MG PO CAPS: 100.0000 mg | ORAL_CAPSULE | Freq: Three times a day (TID) | ORAL | 0 refills | Status: DC | PRN

## 2022-01-18 MED ORDER — LEVOCETIRIZINE DIHYDROCHLORIDE 5 MG PO TABS: 5.0000 mg | ORAL_TABLET | Freq: Every evening | ORAL | 0 refills | Status: DC

## 2022-01-18 MED ORDER — PROMETHAZINE-DM 6.25-15 MG/5ML PO SYRP: 5.0000 mL | ORAL_SOLUTION | Freq: Every evening | ORAL | 0 refills | Status: DC | PRN

## 2022-01-18 NOTE — ED Triage Notes (Signed)
Pt reports fever, sore throat, headache and body aches x 3 days.

## 2022-01-18 NOTE — Discharge Instructions (Signed)
We will notify you of your test results as they arrive and may take between 48-72 hours.  I encourage you to sign up for MyChart if you have not already done so as this can be the easiest way for us to communicate results to you online or through a phone app.  Generally, we only contact you if it is a positive test result.  In the meantime, if you develop worsening symptoms including fever, chest pain, shortness of breath despite our current treatment plan then please report to the emergency room as this may be a sign of worsening status from possible viral infection. ° °Otherwise, we will manage this as a viral syndrome. For sore throat or cough try using a honey-based tea. Use 3 teaspoons of honey with juice squeezed from half lemon. Place shaved pieces of ginger into 1/2-1 cup of water and warm over stove top. Then mix the ingredients and repeat every 4 hours as needed. Please take Tylenol 500mg-650mg every 6 hours for aches and pains, fevers. Hydrate very well with at least 2 liters of water. Eat light meals such as soups to replenish electrolytes and soft fruits, veggies. Start an antihistamine like Zyrtec for postnasal drainage, sinus congestion.  You can take this together with pseudoephedrine (Sudafed) at a dose of 60 mg 2-3 times a day as needed for the same kind of congestion.  Use the cough medications as needed.  °

## 2022-01-18 NOTE — ED Provider Notes (Signed)
[facility]   MRN: [id number] DOB: May 23, 1991  Subjective:   Patricia [name] is a 31 y.o. female presenting for 3-day history of acute onset fever, body aches, throat pain, coughing, sinus headaches, sinus pressure.  No chest pain, shortness of breath or wheezing.  Patient has a history of bariatric surgery.  Also has a history of asthma.  She is not a smoker.  She did a COVID test at home but is not opposed to recheck.  She does take her asthma medications consistently.   Current Facility-Administered Medications:    acetaminophen (TYLENOL) tablet 650 mg, 650 mg, Oral, Once, Wallis Bamberg, PA-C  Current Outpatient Medications:    albuterol (PROAIR HFA) 108 (90 Base) MCG/ACT inhaler, Inhale 2 puffs into the lungs every 4 (four) hours as needed for wheezing or shortness of breath., Disp: 18 each, Rfl: 1   ARMONAIR DIGIHALER 232 MCG/ACT AEPB, Inhale 1 puff into the lungs in the morning and at bedtime. (Patient not taking: Reported on 10/14/2021), Disp: 1 each, Rfl: 5   cetirizine (ZYRTEC) 10 MG tablet, Take 10 mg by mouth daily., Disp: , Rfl:    cholestyramine (QUESTRAN) 4 g packet, MIX 1 PACKET AS DIRECTED AND DRINK BY MOUTH TWICE DAILY WITH MEALS (Patient taking differently: Take 4 g by mouth 2 (two) times daily.), Disp: 60 each, Rfl: 5   cyclobenzaprine (FLEXERIL) 10 MG tablet, Take 1 tablet (10 mg total) by mouth at bedtime as needed for muscle spasms. Do not drink alcohol or drive while taking this medication.  May cause drowsiness. (Patient not taking: Reported on 10/14/2021), Disp: 10 tablet, Rfl: 0   DUPIXENT 300 MG/2ML SOPN, INJECT 300MG  SUBCUTANEOUSLY EVERY OTHER WEEK, Disp: 4 mL, Rfl: 11   fluticasone (FLOVENT HFA) 220 MCG/ACT inhaler, Inhale 2 puffs into the lungs 2 (two) times daily., Disp: , Rfl:    Lisdexamfetamine Dimesylate (VYVANSE) 50 MG CHEW, Chew by mouth daily., Disp: , Rfl:    metFORMIN (GLUCOPHAGE) 500 MG tablet, Take 500 mg by mouth 2 (two) times daily with  a meal., Disp: , Rfl:    naproxen (NAPROSYN) 500 MG tablet, Take 1 tablet (500 mg total) by mouth 2 (two) times daily as needed. (Patient not taking: Reported on 10/14/2021), Disp: 30 tablet, Rfl: 0   omeprazole (PRILOSEC) 40 MG capsule, Take 1 capsule (40 mg total) by mouth 2 (two) times daily before a meal. (Patient not taking: Reported on 10/14/2021), Disp: 60 capsule, Rfl: 5   PANTOPRAZOLE SODIUM PO, Take by mouth. 20 ML's twice a day, Disp: , Rfl:    sertraline (ZOLOFT) 50 MG tablet, Take 1 tablet (50 mg total) by mouth daily., Disp: 30 tablet, Rfl: 6   Allergies  Allergen Reactions   Peanut-Containing Drug Products Anaphylaxis and Hives   Propranolol Shortness Of Breath   Cephalexin Hives   Imitrex [Sumatriptan] Hives   Nickel Rash    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 05/28/2020   Pap +HPV other and [facility], will need colpo per ASCCP guidelines,immediate  risk for CIN 3+ 12.6%    Asthma    Cough 08/01/2015   Fatty liver    Migraine    Migraines    Nausea and vomiting during pregnancy prior to [redacted] weeks gestation 08/01/2015   Obesity    PCO (polycystic ovaries) 03/08/2014   Polycystic disease, ovaries    Pregnancy induced hypertension    Pregnant 05/27/2015  URI (upper respiratory infection) 08/01/2015     Past Surgical History:  Procedure Laterality Date   BIOPSY  01/20/2021   Procedure: BIOPSY;  Surgeon: Lanelle Bal, DO;  Location: [facility];  Service: Endoscopy;;   CHOLECYSTECTOMY N/A 06/08/2017   Procedure: LAPAROSCOPIC CHOLECYSTECTOMY;  Surgeon: Franky Macho, MD;  Location: [facility] ORS;  Service: General;  Laterality: N/A;   ESOPHAGOGASTRODUODENOSCOPY (EGD) WITH PROPOFOL N/A 01/20/2021   LA Grade A reflux esophagitis, gastritis s/p biopsy, normal duodenum s/p biopsy. Negative H.pylori. negative celiac sprue.    TONSILLECTOMY     WISDOM TOOTH EXTRACTION      Family History   Problem Relation Age of Onset   Healthy Mother    Atrial fibrillation Father    Migraines Sister    Hypertension Brother    Heart disease Maternal Grandmother    Cancer Maternal Grandfather        prostate   Stroke Paternal Grandmother    Cancer Paternal Grandfather        prostate   Stroke Paternal Grandfather    Colon cancer Neg Hx    Allergic rhinitis Neg Hx    Angioedema Neg Hx    Asthma Neg Hx    Atopy Neg Hx    Eczema Neg Hx    Urticaria Neg Hx    Immunodeficiency Neg Hx     Social History   Tobacco Use   Smoking status: Never   Smokeless tobacco: Never  Vaping Use   Vaping Use: Never used  Substance Use Topics   Alcohol use: Not Currently    Comment: occassional   Drug use: No    ROS   Objective:   Vitals: BP 119/85 (BP Location: Right Arm)    Pulse 99    Temp (!) 100.4 F (38 C) (Oral)    Resp 18    LMP 12/19/2021 (Exact Date)    SpO2 99%   Physical Exam Constitutional:      General: She is not in acute distress.    Appearance: Normal appearance. She is well-developed. She is obese. She is not ill-appearing, toxic-appearing or diaphoretic.  HENT:     Head: Normocephalic and atraumatic.     Right Ear: Tympanic membrane, ear canal and external ear normal. No drainage or tenderness. No middle ear effusion. There is no impacted cerumen. Tympanic membrane is not erythematous.     Left Ear: Tympanic membrane, ear canal and external ear normal. No drainage or tenderness.  No middle ear effusion. There is no impacted cerumen. Tympanic membrane is not erythematous.     Nose: Congestion present. No rhinorrhea.     Mouth/Throat:     Mouth: Mucous membranes are moist. No oral lesions.     Pharynx: No pharyngeal swelling, oropharyngeal exudate, posterior oropharyngeal erythema or uvula swelling.     Tonsils: No tonsillar exudate or tonsillar abscesses.     Comments: Thick streaks of postnasal drainage overlying pharynx. Eyes:     General: No scleral icterus.        Right eye: No discharge.        Left eye: No discharge.     Extraocular Movements: Extraocular movements intact.     Right eye: Normal extraocular motion.     Left eye: Normal extraocular motion.     Conjunctiva/sclera: Conjunctivae normal.  Cardiovascular:     Rate and Rhythm: Normal rate.  Pulmonary:     Effort: Pulmonary effort is normal.  Musculoskeletal:     Cervical back: Normal  range of motion and neck supple.  Lymphadenopathy:     Cervical: No cervical adenopathy.  Skin:    General: Skin is warm and dry.  Neurological:     General: No focal deficit present.     Mental Status: She is alert and oriented to person, place, and time.  Psychiatric:        Mood and Affect: Mood normal.        Behavior: Behavior normal.    Results for orders placed or performed during the hospital encounter of 01/18/22 (from the past 24 hour(s))  POCT rapid strep A     Status: None   Collection Time: 01/18/22  2:41 PM  Result Value Ref Range   Rapid Strep A Screen Negative Negative    Assessment and Plan :   PDMP not reviewed this encounter.  1. Viral URI with cough   2. Throat pain   3. Body aches   4. Fever, unspecified   5. Mild persistent asthma without complication    Deferred imaging given clear cardiopulmonary exam, hemodynamically stable vital signs.  Given her history of bariatric surgery will avoid steroids for now.  Will manage for viral illness such as viral URI, viral syndrome, viral rhinitis, COVID-19, viral pharyngitis, influenza. Recommended supportive care. Offered scripts for symptomatic relief. COVID 19 and strep culture are pending. Counseled patient on potential for adverse effects with medications prescribed/recommended today, ER and return-to-clinic precautions discussed, patient verbalized understanding.     Wallis Bamberg, [place] 01/18/22 1542

## 2022-01-19 LAB — COVID-19, FLU A+B NAA
Influenza A, NAA: NOT DETECTED
Influenza B, NAA: NOT DETECTED
SARS-CoV-2, NAA: NOT DETECTED

## 2022-01-21 LAB — CULTURE, GROUP A STREP (THRC)

## 2022-02-16 NOTE — Patient Instructions (Addendum)
1. Anaphylactic shock due to food  ?Able to eat [employer] without any problems.  Not avoiding any other foods ?  ?2. Seasonal and perennial allergic rhinitis ?- Continue Xyzal 5 mg once a day ?-Start Flonase (fluticasone) 1 to 2 sprays each nostril once a day as needed for stuffy nose. In the right nostril, point the applicator out toward the right ear. In the left nostril, point the applicator out toward the left ear ?-Start azelastine nasal spray using 1 to 2 sprays each nostril 1-2 times a day as needed for runny nose/drainage down throat ?-May use saline nasal rinse as needed for nasal symptoms.  Use this prior to any medicated nasal sprays ? ?3. Flexural atopic dermatitis ?-Restart with Dupixent every two weeks.  I will send a message to Tammy our Biologics coordinator about restarting this ?-Start hydrocortisone 2.5% cream using 1 application twice a day as needed to red itchy areas.  This is safe to use on the face and neck ?-Start Eucrisa 2% ointment using 1 application as needed to red itchy areas.  This is a nonsteroid cream ?- Continue with moisturizing twice daily.  ? ?4. Mild persistent asthma, uncomplicated  ?-During asthma flares and right now start Flovent 110 mcg 2 puffs twice a day for 1 to 2 weeks or symptoms return to baseline. ?- Continue with albuterol 2-4 puffs every 4-6 hours as needed for cough, wheeze, tightness in chest, or shortness of breath.  ? ?Asthma control goals:  ?Full participation in all desired activities (may need albuterol before activity) ?Albuterol use two time or less a week on average (not counting use with activity) ?Cough interfering with sleep two time or less a month ?Oral steroids no more than once a year ?No hospitalizations ? ? ?5. Schedule a follow up appointment in 3 months ? ?

## 2022-02-17 ENCOUNTER — Encounter: Payer: Self-pay | Admitting: Family

## 2022-02-17 ENCOUNTER — Ambulatory Visit (INDEPENDENT_AMBULATORY_CARE_PROVIDER_SITE_OTHER): Payer: No Typology Code available for payment source | Admitting: Family

## 2022-02-17 ENCOUNTER — Other Ambulatory Visit: Payer: Self-pay

## 2022-02-17 VITALS — BP 124/86 | HR 87 | Temp 98.4°F | Resp 16 | Ht 64.0 in | Wt 247.8 lb

## 2022-02-17 DIAGNOSIS — J3089 Other allergic rhinitis: Secondary | ICD-10-CM | POA: Diagnosis not present

## 2022-02-17 DIAGNOSIS — J302 Other seasonal allergic rhinitis: Secondary | ICD-10-CM

## 2022-02-17 DIAGNOSIS — L2089 Other atopic dermatitis: Secondary | ICD-10-CM

## 2022-02-17 DIAGNOSIS — J4530 Mild persistent asthma, uncomplicated: Secondary | ICD-10-CM

## 2022-02-17 DIAGNOSIS — T7800XD Anaphylactic reaction due to unspecified food, subsequent encounter: Secondary | ICD-10-CM

## 2022-02-17 MED ORDER — AZELASTINE HCL 0.1 % NA SOLN: NASAL | 5 refills | Status: DC

## 2022-02-17 MED ORDER — FLOVENT HFA 110 MCG/ACT IN AERO: INHALATION_SPRAY | RESPIRATORY_TRACT | 5 refills | Status: DC

## 2022-02-17 MED ORDER — FLUTICASONE PROPIONATE 50 MCG/ACT NA SUSP: NASAL | 5 refills | Status: AC

## 2022-02-17 MED ORDER — HYDROCORTISONE 2.5 % EX OINT: TOPICAL_OINTMENT | CUTANEOUS | 5 refills | Status: DC

## 2022-02-17 MED ORDER — VENTOLIN HFA 108 (90 BASE) MCG/ACT IN AERS: 2.0000 | INHALATION_SPRAY | Freq: Four times a day (QID) | RESPIRATORY_TRACT | 1 refills | Status: DC | PRN

## 2022-02-17 MED ORDER — EUCRISA 2 % EX OINT: 1.0000 "application " | TOPICAL_OINTMENT | Freq: Every day | CUTANEOUS | 5 refills | Status: DC | PRN

## 2022-02-17 NOTE — Progress Notes (Signed)
? ?[address], [address] ?[place] [id number] ?Dept: [phone] ? ?FOLLOW UP NOTE ? ?Patient ID: Patricia [name], female    DOB: 11-26-1990  Age: 31 y.o. MRN: [id number] ?Date of Office Visit: 02/17/2022 ? ?Assessment  ?Chief Complaint: Asthma (Had a flare last week maybe due to weather change ), Allergic Rhinitis  (No issues ), and Eczema (Has been having flares - hands ) ? ?HPI ?Patricia FallingJudy C [name] is a 31 year old female who presents today for follow-up of anaphylactic shock due to food, seasonal and perennial allergic rhinitis, mild persistent asthma, and atopic dermatitis.Marland Kitchen.  She was last seen on  February 18, 2021 by Dr. Dellis AnesGallagher.  Since her last office visit on October 07, 2021 she had transoral incisionless fundoplication.  She also reports in December that she had gastric sleeve surgery. ? ?Mild persistent asthma is reported as not well controlled with albuterol at this time.  She is currently not on a daily inhaler.  She reports the Armonair that she was on in the past was given as a sample.  She reports a cough that can be productive and at times and dry.  What she is able to cough up is white in color.  She also reports wheezing and nocturnal awakenings due to cough.  She denies fever, chills, tightness in her chest and shortness of breath.  She went to urgent care on January 18, 2022 for viral URI with cough.  She did not have a chest x-ray.  Her COVID-19, influenza, and strep throat were negative.  She was not given any steroids due to her having gastric sleeve surgery.  Last week she used her albuterol every day.  This week she is only used her albuterol once. ? ?She reports that she stopped doing her daily 8 peanuts for OIT because she got busy.  She does  eat Reese's now and then without any problems.  She is not avoiding any other foods.  Instructed her that since she is able to eat Reeses without any problems that she can continue to eat peanuts. ? ?Seasonal and perennial allergic rhinitis is  reported as not well controlled with Xyzal daily.  She reports clear rhinorrhea, nasal congestion, and postnasal drip.  She has not had any sinus infections since we last saw her. ? ?Flexural atopic dermatitis is reported as not well controlled with hydrocortisone cream as needed.  She would like to start back on Dupixent.  Her last injection was around December 2022.  When she was getting her Dupixent injections her skin looked a lot better.  She reports that recently she has been using wet-to-dry therapy on her hand and it helps some but it never goes away. ? ? ?Drug Allergies:  ?Allergies  ?Allergen Reactions  ? Peanut-Containing Drug Products Anaphylaxis and Hives  ? Propranolol Shortness Of Breath  ? Cephalexin Hives  ? Imitrex [Sumatriptan] Hives  ? Nickel Rash  ? ? ?Review of Systems: ?Review of Systems  ?Constitutional:  Negative for chills and fever.  ?HENT:    ?     Reports clear rhinorrhea, nasal congestion, and postnasal drip  ?Eyes:   ?     Reports itchy watery eyes  ?Respiratory:  Positive for cough and wheezing. Negative for shortness of breath.   ?     Reports a cough that can be dry at times and productive at times.  When it is productive for sputum is white in color.  Denies tightness in chest and shortness of breath  ?  Cardiovascular:  Negative for chest pain and palpitations.  ?Gastrointestinal:   ?     Denies heartburn and reflux as long as she takes her medicine  ?Genitourinary:  Negative for frequency.  ?Skin:  Positive for itching and rash.  ?     Reports broke out in her face last week  ?Neurological:  Negative for headaches.  ?Endo/Heme/Allergies:  Positive for environmental allergies.  ? ? ?Physical Exam: ?BP 124/86   Pulse 87   Temp 98.4 ?F (36.9 ?C)   Resp 16   Ht 5\' 4"  (1.626 m)   Wt 247 lb 12.8 oz (112.4 kg)   SpO2 100%   BMI 42.53 kg/m?   ? ?Physical Exam ?Constitutional:   ?   Appearance: Normal appearance.  ?HENT:  ?   Head: Normocephalic and atraumatic.  ?   Comments: Pharynx  normal, eyes normal, ears: Unable to see left tympanic membrane due to cerumen.  Right ear normal, nose bilateral lower turbinates mildly edematous with no drainage noted ?   Right Ear: Tympanic membrane, ear canal and external ear normal.  ?   Left Ear: Ear canal and external ear normal.  ?   Mouth/Throat:  ?   Mouth: Mucous membranes are moist.  ?   Pharynx: Oropharynx is clear.  ?Eyes:  ?   Conjunctiva/sclera: Conjunctivae normal.  ?Cardiovascular:  ?   Rate and Rhythm: Normal rate and regular rhythm.  ?   Heart sounds: Normal heart sounds.  ?Pulmonary:  ?   Effort: Pulmonary effort is normal.  ?   Breath sounds: Normal breath sounds.  ?   Comments: Lungs clear to auscultation ?Musculoskeletal:  ?   Cervical back: Neck supple.  ?Skin: ?   General: Skin is warm.  ?   Comments: Dry hyperpigmented area noted in right thumb region.  Small flesh-colored, dry flaky papules noted on forehead and cheek region.  ?Neurological:  ?   Mental Status: She is alert and oriented to person, place, and time.  ?Psychiatric:     ?   Mood and Affect: Mood normal.     ?   Behavior: Behavior normal.     ?   Thought Content: Thought content normal.     ?   Judgment: Judgment normal.  ? ? ?Diagnostics: ?FVC 3.04 L (94%), FEV1 2.36 L (86%).  Predicted FVC 3.24 L, predicted FEV1 2.75 L.  Spirometry indicates normal respiratory function ? ?Assessment and Plan: ?1. Mild persistent asthma, uncomplicated   ?2. Anaphylactic shock due to food, subsequent encounter   ?3. Seasonal and perennial allergic rhinitis   ?4. Flexural atopic dermatitis   ? ? ?Meds ordered this encounter  ?Medications  ? hydrocortisone 2.5 % ointment  ?  Sig: 1 application 2 times daily as needed to red itchy areas.  ?  Dispense:  30 g  ?  Refill:  5  ? EUCRISA 2 % OINT  ?  Sig: Apply 1 application. topically daily as needed.  ?  Dispense:  60 g  ?  Refill:  5  ? FLOVENT HFA 110 MCG/ACT inhaler  ?  Sig: Use 2 puffs 2 times daily for 1-2 weeks at a time until symptoms  return to normal.  ?  Dispense:  12 g  ?  Refill:  5  ? VENTOLIN HFA 108 (90 Base) MCG/ACT inhaler  ?  Sig: Inhale 2 puffs into the lungs every 6 (six) hours as needed for wheezing or shortness of breath.  ?  Dispense:  18 g  ?  Refill:  1  ? fluticasone (FLONASE) 50 MCG/ACT nasal spray  ?  Sig: 1-2 sprays each nostril daily as needed for stuffy nose.  ?  Dispense:  16 g  ?  Refill:  5  ? azelastine (ASTELIN) 0.1 % nasal spray  ?  Sig: 1-2 sprays each nostril 1-2 times daily as needed for runny nose/drainage.  ?  Dispense:  30 mL  ?  Refill:  5  ? ? ?Patient Instructions  ?1. Anaphylactic shock due to food  ?Able to eat [employer] without any problems.  Not avoiding any other foods ?  ?2. Seasonal and perennial allergic rhinitis ?- Continue Xyzal 5 mg once a day ?-Start Flonase (fluticasone) 1 to 2 sprays each nostril once a day as needed for stuffy nose. In the right nostril, point the applicator out toward the right ear. In the left nostril, point the applicator out toward the left ear ?-Start azelastine nasal spray using 1 to 2 sprays each nostril 1-2 times a day as needed for runny nose/drainage down throat ?-May use saline nasal rinse as needed for nasal symptoms.  Use this prior to any medicated nasal sprays ? ?3. Flexural atopic dermatitis ?-Restart with Dupixent every two weeks.  I will send a message to Tammy our Biologics coordinator about restarting this ?-Start hydrocortisone 2.5% cream using 1 application twice a day as needed to red itchy areas.  This is safe to use on the face and neck ?-Start Eucrisa 2% ointment using 1 application as needed to red itchy areas.  This is a nonsteroid cream ?- Continue with moisturizing twice daily.  ? ?4. Mild persistent asthma, uncomplicated  ?-During asthma flares and right now start Flovent 110 mcg 2 puffs twice a day for 1 to 2 weeks or symptoms return to baseline. ?- Continue with albuterol 2-4 puffs every 4-6 hours as needed for cough, wheeze, tightness in chest,  or shortness of breath.  ? ?Asthma control goals:  ?Full participation in all desired activities (may need albuterol before activity) ?Albuterol use two time or less a week on average (not counting use with activity

## 2022-02-22 ENCOUNTER — Telehealth: Payer: Self-pay | Admitting: *Deleted

## 2022-02-22 NOTE — Telephone Encounter (Signed)
-----   Message from Althea Charon, [place] sent at 02/17/2022 10:32 AM EDT ----- ?She would like to restart Dupixent injections.(Atopic dermatitis) ?

## 2022-02-22 NOTE — Telephone Encounter (Signed)
Please let Patricia [name] know of her denial for Dupixent for now. She needs to have failed or not able to use 2 of  the medications Tammy has documented. Please have her continue to use [place] and give [place] an update in a month

## 2022-02-22 NOTE — Telephone Encounter (Signed)
Patient had previously been on therapy from patient assistance.  Her Ins has denied coverage for her to restart until she has tried at least two of these alternatives. Unfortunately I ws unable to find documentation to sent for appeal that she has tried and failed these in past. ?The request for coverage for DUPIXENT INJ 300/2ML, use as directed (4 per month), is denied. This ?decision is based on health plan criteria for DUPIXENT INJ 300/2ML. This medicine is covered only if: ?You have failed or cannot use two of the following topical therapies: (document drug, date of trial, and/ ?or contraindication to medication). ?(A) Medium to very-high potency topical corticosteroid [for example: Elocon (mometasone furoate), ?Synalar (fluocinolone acetonide), Lidex (fluocinonide)]. ?(B) Topical calcineurin inhibitor* [for example: Elidel (pimecrolimus), Protopic (tacrolimus)]. ?(C) Eucrisa (crisaborole)*. ?The information provided does not show that you meet the criteria listed above. ?Reviewed by: Kennyth Arnold.Ph. ?*Please note: These products may require prior authorization ?

## 2022-02-23 NOTE — Telephone Encounter (Signed)
Called patient and advised should stay on eucrisa and topical corticosteroid for 4 weeks and report back and we can try to get Ins approval ?

## 2022-04-13 ENCOUNTER — Encounter: Payer: Self-pay | Admitting: Gastroenterology

## 2022-04-13 ENCOUNTER — Telehealth (INDEPENDENT_AMBULATORY_CARE_PROVIDER_SITE_OTHER): Payer: No Typology Code available for payment source | Admitting: Gastroenterology

## 2022-04-13 VITALS — Ht 64.0 in | Wt 236.0 lb

## 2022-04-13 DIAGNOSIS — K760 Fatty (change of) liver, not elsewhere classified: Secondary | ICD-10-CM | POA: Diagnosis not present

## 2022-04-13 NOTE — Patient Instructions (Signed)
We will see you back as needed!  Continue excellent lifestyle changes. Congrats on the weight loss!  We recommend yearly labs for your liver, which your primary care can do. If any issues, they can refer back to [place].  I enjoyed seeing you again today! As you know, I value our relationship and want to provide genuine, compassionate, and quality care. I welcome your feedback. If you receive a survey regarding your visit,  I greatly appreciate you taking time to fill this out. See you next time!  Annitta Needs, PhD, ANP-BC [facility]

## 2022-04-13 NOTE — Progress Notes (Signed)
Primary Care Physician:  Celene Squibb, MD  Primary GI: Dr. Abbey Chatters  Patient Location: Home   Provider [facility]   Reason for Visit: Follow-up   Persons present on the virtual encounter, with roles: Patient and NP   Total time (minutes) spent on medical discussion: 10 minutes   Due to COVID-19, visit was conducted using virtual method.  Visit was requested by patient.  Virtual Visit via MyChart Video Note Due to COVID-19, visit is conducted virtually and was requested by patient.   I connected with Patricia [name] on 04/13/22 at 10:00 AM EDT by video and verified that I am speaking with the correct person using two identifiers.   I discussed the limitations, risks, security and privacy concerns of performing an evaluation and management service by video and the availability of in person appointments. I also discussed with the patient that there may be a patient responsible charge related to this service. The patient expressed understanding and agreed to proceed.  Chief Complaint  Patient presents with   Follow-up    For medications     History of Present Illness: Patricia [name] is a 31 year old female with a history of chronic GERD, last EGD in March 2022 with esohpagitis and gastritis. esophageal manometry which showed findings of potential esophageal outflow obstruction.  Normal peristalsis.  pH testing positive for acid reflux. History of chronic diarrhea on Questran historically.   She had TIF Procedure in Oct 2022. 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    GERD controlled overall. Occasional cough if eating too close to bedtime. Off Protonix.   Has lost from 299 to 236. Occasional diarrhea. More issues with constipation. Not taking Questran any longer.  No abdominal pain.   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 05/28/2020   Pap +HPV other and [facility], will need colpo per [facility]  guidelines,immediate  risk for CIN 3+ 12.6%    Asthma    Cough 08/01/2015   Fatty liver    Migraine    Migraines    Nausea and vomiting during pregnancy prior to [redacted] weeks gestation 08/01/2015   Obesity    PCO (polycystic ovaries) 03/08/2014   Polycystic disease, ovaries    Pregnancy induced hypertension    Pregnant 05/27/2015   URI (upper respiratory infection) 08/01/2015     Past Surgical History:  Procedure Laterality Date   BIOPSY  01/20/2021   Procedure: BIOPSY;  Surgeon: Eloise Harman, DO;  Location: [facility];  Service: Endoscopy;;   CHOLECYSTECTOMY N/A 06/08/2017   Procedure: LAPAROSCOPIC CHOLECYSTECTOMY;  Surgeon: Aviva Signs, MD;  Location: [facility] ORS;  Service: General;  Laterality: N/A;   ESOPHAGOGASTRODUODENOSCOPY (EGD) WITH PROPOFOL N/A 01/20/2021   LA Grade A reflux esophagitis, gastritis s/p biopsy, normal duodenum s/p biopsy. Negative H.pylori. negative celiac sprue.    sleeve gastrectomy     Dec 2022   TIF  08/2021   TONSILLECTOMY     WISDOM TOOTH EXTRACTION       Current Meds  Medication Sig   albuterol (PROAIR HFA) 108 (90 Base) MCG/ACT inhaler Inhale 2 puffs into the lungs every 4 (four) hours as needed for wheezing or shortness of breath.   azelastine (ASTELIN) 0.1 % nasal spray 1-2 sprays each nostril 1-2 times daily as needed for runny nose/drainage.   FLOVENT HFA 110 MCG/ACT inhaler Use 2 puffs 2 times daily for 1-2 weeks at a  time until symptoms return to normal.   fluticasone (FLONASE) 50 MCG/ACT nasal spray 1-2 sprays each nostril daily as needed for stuffy nose.   hydrocortisone 2.5 % ointment 1 application 2 times daily as needed to red itchy areas.   levocetirizine (XYZAL) 5 MG tablet Take 1 tablet (5 mg total) by mouth every evening.   lisdexamfetamine (VYVANSE) 50 MG capsule Take 50 mg by mouth daily.   VENTOLIN HFA 108 (90 Base) MCG/ACT inhaler Inhale 2 puffs into the lungs every 6 (six) hours as needed for wheezing or shortness of  breath.     Family History  Problem Relation Age of Onset   Healthy Mother    Atrial fibrillation Father    Migraines Sister    Hypertension Brother    Heart disease Maternal Grandmother    Cancer Maternal Grandfather        prostate   Stroke Paternal Grandmother    Cancer Paternal Grandfather        prostate   Stroke Paternal Grandfather    Colon cancer Neg Hx    Allergic rhinitis Neg Hx    Angioedema Neg Hx    Asthma Neg Hx    Atopy Neg Hx    Eczema Neg Hx    Urticaria Neg Hx    Immunodeficiency Neg Hx     Social History   Socioeconomic History   Marital status: Married    Spouse name: Not on file   Number of children: Not on file   Years of education: Not on file   Highest education level: Not on file  Occupational History   Not on file  Tobacco Use   Smoking status: Never   Smokeless tobacco: Never  Vaping Use   Vaping Use: Never used  Substance and Sexual Activity   Alcohol use: Not Currently    Comment: occassional   Drug use: No   Sexual activity: Not Currently    Birth control/protection: None  Other Topics Concern   Not on file  Social History Narrative   Not on file   Social Determinants of Health   Financial Resource Strain: Low Risk    Difficulty of Paying Living Expenses: Not hard at all  Food Insecurity: No Food Insecurity   Worried About Charity fundraiser in the Last Year: Never true   Ran Out of Food in the Last Year: Never true  Transportation Needs: No Transportation Needs   Lack of Transportation (Medical): No   Lack of Transportation (Non-Medical): No  Physical Activity: Sufficiently Active   Days of Exercise per Week: 5 days   Minutes of Exercise per Session: 40 min  Stress: No Stress Concern Present   Feeling of Stress : Not at all  Social Connections: Socially Integrated   Frequency of Communication with Friends and Family: More than three times a week   Frequency of Social Gatherings with Friends and Family: More than  three times a week   Attends Religious Services: More than 4 times per year   Active Member of [employer] or Organizations: Yes   Attends Music therapist: More than 4 times per year   Marital Status: Married       Review of Systems: Gen: Denies fever, chills, anorexia. Denies fatigue, weakness, weight loss.  CV: Denies chest pain, palpitations, syncope, peripheral edema, and claudication. Resp: Denies dyspnea at rest, cough, wheezing, coughing up blood, and pleurisy. GI: see HPI Derm: Denies rash, itching, dry skin Psych: Denies depression, anxiety, memory  loss, confusion. No homicidal or suicidal ideation.  Heme: Denies bruising, bleeding, and enlarged lymph nodes.  Observations/Objective: No distress. Unable to perform physical exam due to video encounter.   Lab Results  Component Value Date   ALT 14 05/01/2021   AST 15 05/01/2021   ALKPHOS 110 06/25/2017   BILITOT 0.2 05/01/2021   April 2023: Tbili 0.4, Alk Phos 59, AST 16, ALT 12  Assessment and Plan: 31 year old female with history of chronic GERD, fatty liver but normal LFTs, history of diarrhea, presenting today in follow-up.  GERD resolved s/p TIF procedure and subsequent sleeve gastrectomy. No longer needing PPI.   Diarrhea resolved at this point and no longer on Questran.  Excellent weight loss noted s/p sleeve gastrectomy. Discussed management of fatty liver. Recommend at least yearly HFP through PCP.   We can see her back as needed.   Follow Up Instructions:    I discussed the assessment and treatment plan with the patient. The patient was provided an opportunity to ask questions and all were answered. The patient agreed with the plan and demonstrated an understanding of the instructions.   The patient was advised to call back or seek an in-person evaluation if the symptoms worsen or if the condition fails to improve as anticipated.  I provided 10 minutes of face-to-face time during this MyChart  Video encounter.  Annitta Needs, PhD, ANP-BC [facility]

## 2022-05-27 NOTE — Patient Instructions (Addendum)
Asthma Continue albuterol 2 puffs once every 4 hours as needed for cough or wheeze You may use albuterol 2 puffs 5 to 15 minutes before activity to decrease cough or wheeze For asthma flare, begin Flovent 110-2 puffs twice a day with a spacer for 2 weeks or until cough and wheeze free  Allergic rhinitis Continue allergen avoidance measures directed toward grass pollen, tree pollen, mold, and dust mite as listed below Continue cetirizine 10 mg once a day as needed for runny nose or itch Continue Flonase 2 sprays in each nostril once a day as needed for stuffy nose continue azelastine 2 sprays in each nostril up to twice a day as needed for runny nose Consider saline nasal rinses as needed for nasal symptoms. Use this before any medicated nasal sprays for best result  Atopic dermatitis Continue a twice a day moisturizing routine Continue Eucrisa to red and itchy areas up to twice a day as needed For stubborn red itchy areas, continue hydrocortisone 2.5% cream up to twice a day We will submit you for Dupixent injections to control your atopic dermatitis.  You will next hear from our biologic medication coordinator Tammy with next steps  Call the clinic if this treatment plan is not working well for you  Follow up in 4 months or sooner if needed.  Reducing Pollen Exposure The American Academy of Allergy, Asthma and Immunology suggests the following steps to reduce your exposure to pollen during allergy seasons. Do not hang sheets or clothing out to dry; pollen may collect on these items. Do not mow lawns or spend time around freshly cut grass; mowing stirs up pollen. Keep windows closed at night.  Keep car windows closed while driving. Minimize morning activities outdoors, a time when pollen counts are usually at their highest. Stay indoors as much as possible when pollen counts or humidity is high and on windy days when pollen tends to remain in the air longer. Use air conditioning when  possible.  Many air conditioners have filters that trap the pollen spores. Use a HEPA room air filter to remove pollen form the indoor air you breathe.  Control of Mold Allergen Mold and fungi can grow on a variety of surfaces provided certain temperature and moisture conditions exist.  Outdoor molds grow on plants, decaying vegetation and soil.  The major outdoor mold, Alternaria and Cladosporium, are found in very high numbers during hot and dry conditions.  Generally, a late Summer - Fall peak is seen for common outdoor fungal spores.  Rain will temporarily lower outdoor mold spore count, but counts rise rapidly when the rainy period ends.  The most important indoor molds are Aspergillus and Penicillium.  Dark, humid and poorly ventilated basements are ideal sites for mold growth.  The next most common sites of mold growth are the bathroom and the kitchen.  Outdoor [employer] Use air conditioning and keep windows closed Avoid exposure to decaying vegetation. Avoid leaf raking. Avoid grain handling. Consider wearing a face mask if working in moldy areas.  Indoor Mold Control Maintain humidity below 50%. Clean washable surfaces with 5% bleach solution. Remove sources e.g. Contaminated carpets.   Control of Dust Mite Allergen Dust mites play a major role in allergic asthma and rhinitis. They occur in environments with high humidity wherever human skin is found. Dust mites absorb humidity from the atmosphere (ie, they do not drink) and feed on organic matter (including shed human and animal skin). Dust mites are a microscopic type of  insect that you cannot see with the naked eye. High levels of dust mites have been detected from mattresses, pillows, carpets, upholstered furniture, bed covers, clothes, soft toys and any woven material. The principal allergen of the dust mite is found in its feces. A gram of dust may contain 1,000 mites and 250,000 fecal particles. Mite antigen is easily measured  in the air during house cleaning activities. Dust mites do not bite and do not cause harm to humans, other than by triggering allergies/asthma.  Ways to decrease your exposure to dust mites in your home:  1. Encase mattresses, box springs and pillows with a mite-impermeable barrier or cover  2. Wash sheets, blankets and drapes weekly in hot water (130 F) with detergent and dry them in a dryer on the hot setting.  3. Have the room cleaned frequently with a vacuum cleaner and a damp dust-mop. For carpeting or rugs, vacuuming with a vacuum cleaner equipped with a high-efficiency particulate air (HEPA) filter. The dust mite allergic individual should not be in a room which is being cleaned and should wait 1 hour after cleaning before going into the room.  4. Do not sleep on upholstered furniture (eg, couches).  5. If possible removing carpeting, upholstered furniture and drapery from the home is ideal. Horizontal blinds should be eliminated in the rooms where the person spends the most time (bedroom, study, television room). Washable vinyl, roller-type shades are optimal.  6. Remove all non-washable stuffed toys from the bedroom. Wash stuffed toys weekly like sheets and blankets above.  7. Reduce indoor humidity to less than 50%. Inexpensive humidity monitors can be purchased at most hardware stores. Do not use a humidifier as can make the problem worse and are not recommended.

## 2022-05-27 NOTE — Progress Notes (Signed)
[address] [place] [address] Dept: [phone]  FOLLOW UP NOTE  Patient ID: Patricia [name], female    DOB: 05/20/1991  Age: 31 y.o. MRN: [id number] Date of Office Visit: 05/28/2022  Assessment  Chief Complaint: Asthma (No issues ), Allergic Rhinitis  (No issues ), and Eczema (Dupixent follow-up getting better but still having issues )  HPI Patricia [name] is a 31 year old female who presents the clinic for follow-up visit.  She was last seen in this clinic on 02/17/2022 by Nehemiah Settle, FNP, for evaluation of asthma, allergic rhinitis, and atopic dermatitis.  At today's visit, she reports her asthma has been well controlled with no symptoms including shortness of breath, cough, or wheeze.  She reports that she has not used her albuterol since her last visit to this clinic.  Allergic rhinitis is reported as moderately well controlled with occasional nasal congestion and clear rhinorrhea.  She continues cetirizine 10 mg once a day and uses Flonase as needed.  She is not currently using azelastine or nasal saline rinses.  Her last environmental allergy testing was on 03/07/2019 was positive to grass pollen, tree pollen, mold, and dust mite.  Atopic dermatitis is reported as poorly controlled with red and itchy areas occurring mainly on her hands in a flare in remission pattern.  She continues twice a day moisturizing routine as well as Eucrisa and hydrocortisone.  She has previously used Dupixent with complete remission of eczema on her hands.  She continues to consume products containing peanuts occasionally with no adverse reaction or EpiPen use.  Her current medications are listed in the chart.   Drug Allergies:  Allergies  Allergen Reactions   Peanut-Containing Drug Products Anaphylaxis and Hives   Propranolol Shortness Of Breath   Cephalexin Hives   Imitrex [Sumatriptan] Hives   Nickel Rash    Physical Exam: BP 138/90   Pulse (!) 59   Resp 18   Ht 5\' 4"  (1.626 m)    Wt 231 lb (104.8 kg)   SpO2 98%   BMI 39.65 kg/m    Physical Exam Vitals reviewed.  Constitutional:      Appearance: Normal appearance.  HENT:     Head: Normocephalic and atraumatic.     Right Ear: Tympanic membrane normal.     Left Ear: Tympanic membrane normal.     Nose:     Comments: Bilateral nares slightly erythematous with clear nasal drainage noted.  Pharynx normal.  Ears normal.  Eyes normal.    Mouth/Throat:     Pharynx: Oropharynx is clear.  Eyes:     Conjunctiva/sclera: Conjunctivae normal.  Cardiovascular:     Rate and Rhythm: Normal rate and regular rhythm.     Heart sounds: Normal heart sounds. No murmur heard. Pulmonary:     Effort: Pulmonary effort is normal.     Breath sounds: Normal breath sounds.     Comments: Lungs clear to auscultation Musculoskeletal:        General: Normal range of motion.     Cervical back: Normal range of motion and neck supple.  Skin:    General: Skin is warm.     Comments: Bilateral hands and wrists with scattered eczematous patches noted.  No open areas or drainage noted.  Neurological:     Mental Status: She is alert and oriented to person, place, and time.  Psychiatric:        Mood and Affect: Mood normal.        Behavior:  Behavior normal.        Thought Content: Thought content normal.        Judgment: Judgment normal.     Diagnostics: FVC 3.31, FEV1 2.34.  Predicted FVC 3.24, predicted FEV1 2.74.  Spirometry indicates possible mild obstruction.  Assessment and Plan: 1. Mild persistent asthma, uncomplicated   2. Flexural atopic dermatitis   3. Seasonal and perennial allergic rhinitis     Patient Instructions  Asthma Continue albuterol 2 puffs once every 4 hours as needed for cough or wheeze You may use albuterol 2 puffs 5 to 15 minutes before activity to decrease cough or wheeze For asthma flare, begin Flovent 110-2 puffs twice a day with a spacer for 2 weeks or until cough and wheeze free  Allergic  rhinitis Continue allergen avoidance measures directed toward grass pollen, tree pollen, mold, and dust mite as listed below Continue cetirizine 10 mg once a day as needed for runny nose or itch Continue Flonase 2 sprays in each nostril once a day as needed for stuffy nose continue azelastine 2 sprays in each nostril up to twice a day as needed for runny nose Consider saline nasal rinses as needed for nasal symptoms. Use this before any medicated nasal sprays for best result  Atopic dermatitis Continue a twice a day moisturizing routine Continue Eucrisa to red and itchy areas up to twice a day as needed For stubborn red itchy areas, continue hydrocortisone 2.5% cream up to twice a day We will submit you for Dupixent injections to control your atopic dermatitis.  You will next hear from our biologic medication coordinator Tammy with next steps  Call the clinic if this treatment plan is not working well for you  Follow up in 4 months or sooner if needed.   Return in about 4 months (around 09/28/2022), or if symptoms worsen or fail to improve.    Thank you for the opportunity to care for this patient.  Please do not hesitate to contact me with questions.  Thermon Leyland, FNP [facility]

## 2022-05-28 ENCOUNTER — Encounter: Payer: Self-pay | Admitting: Family Medicine

## 2022-05-28 ENCOUNTER — Other Ambulatory Visit: Payer: Self-pay

## 2022-05-28 ENCOUNTER — Ambulatory Visit (INDEPENDENT_AMBULATORY_CARE_PROVIDER_SITE_OTHER): Payer: No Typology Code available for payment source | Admitting: Family Medicine

## 2022-05-28 VITALS — BP 138/90 | HR 59 | Resp 18 | Ht 64.0 in | Wt 231.0 lb

## 2022-05-28 DIAGNOSIS — L2089 Other atopic dermatitis: Secondary | ICD-10-CM | POA: Diagnosis not present

## 2022-05-28 DIAGNOSIS — J4530 Mild persistent asthma, uncomplicated: Secondary | ICD-10-CM

## 2022-05-28 DIAGNOSIS — J302 Other seasonal allergic rhinitis: Secondary | ICD-10-CM

## 2022-05-28 DIAGNOSIS — J3089 Other allergic rhinitis: Secondary | ICD-10-CM

## 2022-05-28 HISTORY — DX: Other atopic dermatitis: L20.89

## 2022-05-31 ENCOUNTER — Ambulatory Visit (INDEPENDENT_AMBULATORY_CARE_PROVIDER_SITE_OTHER): Payer: No Typology Code available for payment source | Admitting: Adult Health

## 2022-05-31 ENCOUNTER — Encounter: Payer: Self-pay | Admitting: Adult Health

## 2022-05-31 ENCOUNTER — Other Ambulatory Visit (HOSPITAL_COMMUNITY)
Admission: RE | Admit: 2022-05-31 | Discharge: 2022-05-31 | Disposition: A | Discharge status: Home / Self Care | Payer: No Typology Code available for payment source | Source: Ambulatory Visit | Attending: Adult Health | Admitting: Adult Health

## 2022-05-31 VITALS — BP 134/91 | HR 72 | Ht 64.25 in | Wt 230.5 lb

## 2022-05-31 DIAGNOSIS — Z01419 Encounter for gynecological examination (general) (routine) without abnormal findings: Secondary | ICD-10-CM

## 2022-05-31 DIAGNOSIS — Z0000 Encounter for general adult medical examination without abnormal findings: Secondary | ICD-10-CM | POA: Insufficient documentation

## 2022-05-31 DIAGNOSIS — Z0001 Encounter for general adult medical examination with abnormal findings: Secondary | ICD-10-CM

## 2022-05-31 DIAGNOSIS — N3946 Mixed incontinence: Secondary | ICD-10-CM

## 2022-05-31 DIAGNOSIS — Z01411 Encounter for gynecological examination (general) (routine) with abnormal findings: Secondary | ICD-10-CM | POA: Diagnosis not present

## 2022-05-31 DIAGNOSIS — R87610 Atypical squamous cells of undetermined significance on cytologic smear of cervix (ASC-US): Secondary | ICD-10-CM

## 2022-05-31 DIAGNOSIS — Z3009 Encounter for other general counseling and advice on contraception: Secondary | ICD-10-CM

## 2022-05-31 DIAGNOSIS — R32 Unspecified urinary incontinence: Secondary | ICD-10-CM | POA: Diagnosis not present

## 2022-05-31 DIAGNOSIS — R87810 Cervical high risk human papillomavirus (HPV) DNA test positive: Secondary | ICD-10-CM

## 2022-05-31 LAB — POCT URINALYSIS DIPSTICK
Blood, UA: NEGATIVE
Glucose, UA: NEGATIVE
Ketones, UA: NEGATIVE
Leukocytes, UA: NEGATIVE
Nitrite, UA: NEGATIVE
Protein, UA: NEGATIVE

## 2022-05-31 NOTE — Progress Notes (Signed)
Patient ID: Patricia [name], female   DOB: June 08, 1991, 31 y.o.   MRN: [id number] History of Present Illness: Patricia [name] is a 31 year old black female,married, [id number], in for a well woman gyn exam and pap. Her pap 7/522 was ASCUS +HPV. Had colpo 06/25/21 that was normal. PCP is Dr Margo Aye.  Current Medications, Allergies, Past Medical History, Past Surgical History, Family History and Social History were reviewed in [employer] record.     Review of Systems:  Patient denies any headaches, hearing loss, fatigue, blurred vision, shortness of breath, chest pain, abdominal pain, problems with bowel movements,  or intercourse.(Not active). No joint pain or mood swings.  Has urinary incontinence   Physical Exam:BP (!) 134/91 (BP Location: Left Arm, Patient Position: Sitting, Cuff Size: Large)   Pulse 72   Ht 5' 4.25" (1.632 m)   Wt 230 lb 8 oz (104.6 kg)   LMP 05/17/2022   BMI 39.26 kg/m  Urine dipstick is negative, she has lost 63 lbs since last year at this time, had sleeve surgery in December  General:  Well developed, well nourished, no acute distress Skin:  Warm and dry Neck:  Midline trachea, normal thyroid, good ROM, no lymphadenopathy Lungs; Clear to auscultation bilaterally Breast:  No dominant palpable mass, retraction, or nipple discharge Cardiovascular: Regular rate and rhythm Abdomen:  Soft, non tender, no hepatosplenomegaly Pelvic:  External genitalia is normal in appearance, no lesions.  The vagina is normal in appearance. Urethra has no lesions or masses. The cervix is bulbous.Pap with HR HPV genotyping and GC/CHL performed.  Uterus is felt to be normal size, shape, and contour.  No adnexal masses or tenderness noted.Bladder is non tender, no masses felt. Rectal:Deferred  Extremities/musculoskeletal:  No swelling or varicosities noted, no clubbing or cyanosis Psych:  No mood changes, alert and cooperative,seems happy AA is 1 Fall risk is low    05/31/2022    8:37  AM 05/26/2021    8:48 AM 12/31/2020    8:42 AM  Depression screen PHQ 2/9  Decreased Interest 0 0 0  Down, Depressed, Hopeless 0 0 0  PHQ - 2 Score 0 0 0  Altered sleeping 0 0 2  Tired, decreased energy 0 0 1  Change in appetite 0 1 3  Feeling bad or failure about yourself  0 0 0  Trouble concentrating 1 1 2   Moving slowly or fidgety/restless 0 0 0  Suicidal thoughts 0 0 0  PHQ-9 Score 1 2 8   Difficult doing work/chores   Somewhat difficult       05/31/2022    8:37 AM 05/26/2021    8:49 AM 12/31/2020    8:44 AM 10/15/2020    9:48 AM  GAD 7 : Generalized Anxiety Score  Nervous, Anxious, on Edge 0 0 0 0  Control/stop worrying 0 0 0 0  Worry too much - different things 0 0 0 0  Trouble relaxing 0 0 1 1  Restless 0 0 0 0  Easily annoyed or irritable 1 0 0 1  Afraid - awful might happen 0 0 0 0  Total GAD 7 Score 1 0 1 2    Upstream - 05/31/22 0848       Pregnancy Intention Screening   Does the patient want to become pregnant in the next year? Yes    Does the patient's partner want to become pregnant in the next year? Yes    Would the patient like to discuss contraceptive options today?  Yes      Contraception Wrap Up   Current Method Abstinence    End Method Abstinence   may try phexxi when husbadn gets home in march   Contraception Counseling Provided Yes              Examination chaperoned by Malachy Mood LPN   Impression and Plan: 1. Encounter for gynecological examination with Papanicolaou smear of cervix Pap sent Pap in 3 years if normal Physical in 1 year Labs with PCP Continue weight loss efforts   - Cytology - PAP( [place])  2. Routine general medical examination at a health care facility Pap sent  - Cytology - PAP( [place])  3. Urinary incontinence, unspecified type Urine dipstick is negative  - POCT Urinalysis Dipstick  4. Family planning  5. ASCUS with positive high risk HPV cervical Pap sent   6. Mixed stress and urge urinary  incontinence Has incontinence with coughing or sneezing, or when has to go Try kegels Decrease caffeine Try to go empty bladder more frequently  Continue weight loss efforts

## 2022-06-02 LAB — CYTOLOGY - PAP
Chlamydia: NEGATIVE
Comment: NEGATIVE
Comment: NEGATIVE
Comment: NEGATIVE
Comment: NEGATIVE
Comment: NORMAL
Diagnosis: HIGH — AB
HPV 16: NEGATIVE
HPV 18 / 45: NEGATIVE
High risk HPV: POSITIVE — AB
Neisseria Gonorrhea: NEGATIVE

## 2022-06-04 ENCOUNTER — Encounter: Payer: Self-pay | Admitting: Adult Health

## 2022-06-04 DIAGNOSIS — R87613 High grade squamous intraepithelial lesion on cytologic smear of cervix (HGSIL): Secondary | ICD-10-CM

## 2022-06-04 HISTORY — DX: High grade squamous intraepithelial lesion on cytologic smear of cervix (HGSIL): R87.613

## 2022-06-06 ENCOUNTER — Ambulatory Visit
Admission: EM | Admit: 2022-06-06 | Discharge: 2022-06-06 | Disposition: A | Discharge status: Home / Self Care | Payer: No Typology Code available for payment source | Attending: Family Medicine | Admitting: Family Medicine

## 2022-06-06 DIAGNOSIS — K047 Periapical abscess without sinus: Secondary | ICD-10-CM | POA: Diagnosis not present

## 2022-06-06 MED ORDER — CHLORHEXIDINE GLUCONATE 0.12 % MT SOLN: 15.0000 mL | Freq: Two times a day (BID) | OROMUCOSAL | 0 refills | Status: DC

## 2022-06-06 MED ORDER — LIDOCAINE VISCOUS HCL 2 % MT SOLN: 10.0000 mL | OROMUCOSAL | 0 refills | Status: DC | PRN

## 2022-06-06 MED ORDER — CLINDAMYCIN HCL 300 MG PO CAPS: 300.0000 mg | ORAL_CAPSULE | Freq: Two times a day (BID) | ORAL | 0 refills | Status: DC

## 2022-06-06 NOTE — ED Triage Notes (Signed)
Pt presents with left sided tooth pain that started on Thursday. Reports now left upper cheek area is sore.

## 2022-06-06 NOTE — ED Provider Notes (Signed)
[facility]    CSN: [id number] Arrival date & time: 06/06/22  0834      History   Chief Complaint Chief Complaint  Patient presents with   Dental Pain    HPI Patricia [name] is a 31 y.o. female.   Presenting today with 4-day history of progressively worsening left upper molar dental pain, now facial swelling additionally.  Denies fever, chills, drainage, difficulty breathing or swallowing, known dental issues in this area.  Has been trying clove oil, numbing gels, and Tylenol with minimal relief.    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 05/28/2020   Pap +HPV other and [facility], will need colpo per ASCCP guidelines,immediate  risk for CIN 3+ 12.6%    Asthma    Cough 08/01/2015   Fatty liver    Flexural atopic dermatitis 05/28/2022   HSIL (high grade squamous intraepithelial lesion) on Pap smear of cervix 06/04/2022   06/04/22 colpo per ASCCP guidelines, immediate risk is 23 %   Migraine    Migraines    Nausea and vomiting during pregnancy prior to [redacted] weeks gestation 08/01/2015   Obesity    PCO (polycystic ovaries) 03/08/2014   Polycystic disease, ovaries    Pregnancy induced hypertension    Pregnant 05/27/2015   URI (upper respiratory infection) 08/01/2015    Patient Active Problem List   Diagnosis Date Noted   HSIL (high grade squamous intraepithelial lesion) on Pap smear of cervix 06/04/2022   ASCUS with positive high risk HPV cervical 05/31/2022   Urinary incontinence 05/31/2022   Routine general medical examination at a health care facility 05/31/2022   Mixed stress and urge urinary incontinence 05/31/2022   Mild persistent asthma, uncomplicated 05/28/2022   Flexural atopic dermatitis 05/28/2022   Pap smear of cervix with ASCUS, cannot exclude HGSIL 05/26/2021   Hip pain, right 05/26/2021   Family planning 05/26/2021   RUQ pain 04/22/2021   Esophagitis 04/22/2021    Pelvic pain 11/28/2020   Hypertension 07/15/2020   Abnormal Papanicolaou smear of cervix with positive human papilloma virus (HPV) test 05/28/2020   Nexplanon removal 05/20/2020   Encounter for gynecological examination with Papanicolaou smear of cervix 05/20/2020   Depression 05/20/2020   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   Fatty liver 11/29/2019   Diarrhea 09/27/2019   Elevated LFTs 06/17/2017   Calculus of gallbladder with acute cholecystitis without obstruction    Nausea vomiting and diarrhea    RUQ abdominal pain    Transaminitis    Biliary colic    Gall stones, common bile duct    Abdominal pain 06/04/2017   Chronic hypertension complicating or reason for care during childbirth 04/13/2017   Chronic hypertension in pregnancy 04/13/2017   Chronic hypertension affecting pregnancy 11/08/2016   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   Supervision of high risk pregnancy, antepartum 09/27/2016   History of gestational hypertension 09/27/2016   History of shoulder dystocia in prior pregnancy, currently pregnant 09/27/2016   URI (upper respiratory infection) 08/01/2015   Persistent cough 08/01/2015   Obesity, Class III, BMI 40-49.9 (morbid obesity) (HCC) 09/23/2014   Headache 09/23/2014   PCO (polycystic ovaries) 03/08/2014    Past Surgical History:  Procedure Laterality Date   BIOPSY  01/20/2021   Procedure: BIOPSY;  Surgeon: Lanelle Bal, DO;  Location: [facility];  Service: Endoscopy;;   CHOLECYSTECTOMY N/A 06/08/2017   Procedure: LAPAROSCOPIC CHOLECYSTECTOMY;  Surgeon:  Aviva Signs, MD;  Location: [facility];  Service: General;  Laterality: N/A;   ESOPHAGOGASTRODUODENOSCOPY (EGD) WITH PROPOFOL N/A 01/20/2021   LA Grade A reflux esophagitis, gastritis s/p biopsy, normal duodenum s/p biopsy. Negative H.pylori. negative celiac sprue.    sleeve gastrectomy     Dec 2022   TIF  08/2021   TONSILLECTOMY     WISDOM TOOTH EXTRACTION       OB History     Gravida  3   Para  2   Term  2   Preterm      AB  1   Living  2      SAB  1   IAB      Ectopic      Multiple  0   Live Births  2            Home Medications    Prior to Admission medications   Medication Sig Start Date End Date Taking? Authorizing Provider  chlorhexidine (PERIDEX) 0.12 % solution Use as directed 15 mLs in the mouth or throat 2 (two) times daily. 06/06/22  Yes Volney American, PA-C  clindamycin (CLEOCIN) 300 MG capsule Take 1 capsule (300 mg total) by mouth 2 (two) times daily. 06/06/22  Yes Volney American, PA-C  lidocaine (XYLOCAINE) 2 % solution Use as directed 10 mLs in the mouth or throat every 3 (three) hours as needed for mouth pain. 06/06/22  Yes Volney American, PA-C  albuterol [facility]) 108 [phone] Base) MCG/ACT inhaler Inhale 2 puffs into the lungs every 4 (four) hours as needed for wheezing or shortness of breath. 07/24/21   Valentina Shaggy, MD  atomoxetine (STRATTERA) 40 MG capsule Take 40 mg by mouth daily. 05/03/22   [provider]  CALCIUM PO Take by mouth.    [provider]  Cetirizine HCl 10 MG TBDP Take by mouth.    [provider]  EUCRISA 2 % OINT Apply 1 application. topically daily as needed. 02/17/22   Althea Charon, FNP  FLOVENT HFA 110 MCG/ACT inhaler Use 2 puffs 2 times daily for 1-2 weeks at a time until symptoms return to normal. 02/17/22   Althea Charon, FNP  fluticasone [facility]) 50 MCG/ACT nasal spray 1-2 sprays each nostril daily as needed for stuffy nose. 02/17/22   Althea Charon, FNP  hydrocortisone 2.5 % ointment 1 application 2 times daily as needed to red itchy areas. 02/17/22   Althea Charon, FNP  metFORMIN (GLUCOPHAGE) 500 MG tablet Take 500 mg by mouth 2 (two) times daily with a meal. 02/02/21   [provider]  Prenatal Vit-Fe Fumarate-FA (PRENATAL VITAMIN PO) Take by mouth.    [provider]  VENTOLIN HFA 108 (90 Base)  MCG/ACT inhaler Inhale 2 puffs into the lungs every 6 (six) hours as needed for wheezing or shortness of breath. 02/17/22   Althea Charon, FNP    Family History Family History  Problem Relation Age of Onset   Healthy Mother    Atrial fibrillation Father    Migraines Sister    Hypertension Brother    Heart disease Maternal Grandmother    Cancer Maternal Grandfather        prostate   Stroke Paternal Grandmother    Cancer Paternal Grandfather        prostate   Stroke Paternal Grandfather    Colon cancer Neg Hx    Allergic rhinitis Neg Hx    Angioedema Neg Hx    Asthma Neg  Hx    Atopy Neg Hx    Eczema Neg Hx    Urticaria Neg Hx    Immunodeficiency Neg Hx     Social History Social History   Tobacco Use   Smoking status: Never   Smokeless tobacco: Never  Vaping Use   Vaping Use: Never used  Substance Use Topics   Alcohol use: Yes    Comment: occassional   Drug use: No     Allergies   Peanut-containing drug products, Propranolol, Cephalexin, Imitrex [sumatriptan], and Nickel   Review of Systems Review of Systems Per HPI  Physical Exam Triage Vital Signs ED Triage Vitals  Enc Vitals Group     BP 06/06/22 0845 130/90     Pulse Rate 06/06/22 0845 60     Resp 06/06/22 0845 19     Temp 06/06/22 0845 98.9 F (37.2 C)     Temp src --      SpO2 06/06/22 0845 95 %     Weight --      Height --      Head Circumference --      Peak Flow --      Pain Score 06/06/22 0844 8     Pain Loc --      Pain Edu? --      Excl. in GC? --    No data found.  Updated Vital Signs BP 130/90   Pulse 60   Temp 98.9 F (37.2 C)   Resp 19   LMP 05/17/2022   SpO2 95%   Visual Acuity Right Eye Distance:   Left Eye Distance:   Bilateral Distance:    Right Eye Near:   Left Eye Near:    Bilateral Near:     Physical Exam Vitals and nursing note reviewed.  Constitutional:      Appearance: Normal appearance. She is not ill-appearing.  HENT:     Head: Atraumatic.      Mouth/Throat:     Mouth: Mucous membranes are moist.     Comments: Left cheek facial swelling coordinating with the area of pain left upper molar Mild gingival erythema, edema to the area with discomfort left upper molars Eyes:     Extraocular Movements: Extraocular movements intact.     Conjunctiva/sclera: Conjunctivae normal.  Cardiovascular:     Rate and Rhythm: Normal rate and regular rhythm.     Heart sounds: Normal heart sounds.  Pulmonary:     Effort: Pulmonary effort is normal.     Breath sounds: Normal breath sounds.  Musculoskeletal:        General: Normal range of motion.     Cervical back: Normal range of motion and neck supple.  Lymphadenopathy:     Cervical: No cervical adenopathy.  Skin:    General: Skin is warm and dry.  Neurological:     Mental Status: She is alert and oriented to person, place, and time.  Psychiatric:        Mood and Affect: Mood normal.        Thought Content: Thought content normal.        Judgment: Judgment normal.      UC Treatments / Results  Labs (all labs ordered are listed, but only abnormal results are displayed) Labs Reviewed - No data to display  EKG   Radiology No results found.  Procedures Procedures (including critical care time)  Medications Ordered in UC Medications - No data to display  Initial Impression / Assessment and Plan /  UC Course  I have reviewed the triage vital signs and the nursing notes.  Pertinent labs & imaging results that were available during my care of the patient were reviewed by me and considered in my medical decision making (see chart for details).     Cover for dental infection with clindamycin, Peridex, viscous lidocaine.  Follow-up with dentist as soon as possible.  Final Clinical Impressions(s) / UC Diagnoses   Final diagnoses:  Dental infection   Discharge Instructions   None    ED Prescriptions     Medication Sig Dispense Auth. Provider   clindamycin (CLEOCIN) 300 MG  capsule Take 1 capsule (300 mg total) by mouth 2 (two) times daily. 14 capsule Volney American, [place]   chlorhexidine (PERIDEX) 0.12 % solution Use as directed 15 mLs in the mouth or throat 2 (two) times daily. 120 mL Volney American, PA-C   lidocaine (XYLOCAINE) 2 % solution Use as directed 10 mLs in the mouth or throat every 3 (three) hours as needed for mouth pain. 100 mL Volney American, [place]      PDMP not reviewed this encounter.   Volney American, [place] 06/06/22 [phone]

## 2022-06-07 ENCOUNTER — Encounter: Payer: Self-pay | Admitting: Obstetrics & Gynecology

## 2022-06-07 ENCOUNTER — Ambulatory Visit (INDEPENDENT_AMBULATORY_CARE_PROVIDER_SITE_OTHER): Payer: No Typology Code available for payment source | Admitting: Obstetrics & Gynecology

## 2022-06-07 ENCOUNTER — Other Ambulatory Visit (HOSPITAL_COMMUNITY)
Admission: RE | Admit: 2022-06-07 | Discharge: 2022-06-07 | Disposition: A | Discharge status: Home / Self Care | Payer: No Typology Code available for payment source | Source: Ambulatory Visit | Attending: Obstetrics & Gynecology | Admitting: Obstetrics & Gynecology

## 2022-06-07 VITALS — BP 132/81 | HR 73 | Ht 64.25 in | Wt 232.0 lb

## 2022-06-07 DIAGNOSIS — R87810 Cervical high risk human papillomavirus (HPV) DNA test positive: Secondary | ICD-10-CM | POA: Diagnosis not present

## 2022-06-07 DIAGNOSIS — Z3202 Encounter for pregnancy test, result negative: Secondary | ICD-10-CM

## 2022-06-07 DIAGNOSIS — R87613 High grade squamous intraepithelial lesion on cytologic smear of cervix (HGSIL): Secondary | ICD-10-CM | POA: Diagnosis present

## 2022-06-07 LAB — POCT URINE PREGNANCY: Preg Test, Ur: NEGATIVE

## 2022-06-07 NOTE — Progress Notes (Signed)
Patient name: Patricia [name] MRN [id number]  Date of birth: April 23, 1991 Chief Complaint:   No chief complaint on file.  History of Present Illness:   Patricia [name] is a 31 y.o. [phone] female being seen today for cervical dysplasia management.  Recent pap 03/22/22: HSIL, HPV+ Previous pap 05/2021- ASCUS, HPV+, colposcopy completed 06/2021 2021- ASCUS-H, HPV+  Smoker:  No. New sexual partner:  No.  Interested in a pregnancy in the future though partner is currently incarcerated.  Reviewed her concerns regarding dysplasia and impact on pregnancy.  History of abnormal Pap: yes  Menses are regular each month.  Patient's last menstrual period was 05/17/2022.     05/31/2022    8:37 AM 05/26/2021    8:48 AM 12/31/2020    8:42 AM 10/15/2020    9:47 AM 07/15/2020    8:35 AM  Depression screen PHQ 2/9  Decreased Interest 0 0 0 1 0  Down, Depressed, Hopeless 0 0 0 0 0  PHQ - 2 Score 0 0 0 1 0  Altered sleeping 0 0 2 1 0  Tired, decreased energy 0 0 1 0 0  Change in appetite 0 1 3 0 0  Feeling bad or failure about yourself  0 0 0 0 0  Trouble concentrating 1 1 2 1 3   Moving slowly or fidgety/restless 0 0 0 0 0  Suicidal thoughts 0 0 0 0 0  PHQ-9 Score 1 2 8 3 3   Difficult doing work/chores   Somewhat difficult  Somewhat difficult     Review of Systems:   Pertinent items are noted in HPI Denies fever/chills, dizziness, headaches, visual disturbances, fatigue, shortness of breath, chest pain, abdominal pain, vomiting, no problems with periods, bowel movements, urination, or intercourse unless otherwise stated above.  Pertinent History Reviewed:  Reviewed past medical,surgical, social, obstetrical and family history.  Reviewed problem list, medications and allergies. Physical Assessment:  There were no vitals filed for this visit.There is no height or weight on file to calculate BMI.       Physical Examination:   General appearance: alert, well appearing, and in no distress  Psych:  mood appropriate, normal affect  Skin: warm & dry   Cardiovascular: normal heart rate noted  Respiratory: normal respiratory effort, no distress  Abdomen: soft, non-tender   Pelvic: VULVA: normal appearing vulva with no masses, tenderness or lesions, VAGINA: normal appearing vagina with normal color and discharge, no lesions, CERVIX: visualized see colposcopy section  Extremities: no edema   Chaperone:     Colposcopy Procedure Note  Indications: HSIL, HPV+    Procedure Details  The risks and benefits of the procedure and Written informed consent obtained.  Speculum placed in vagina and excellent visualization of cervix achieved, cervix swabbed x 3 with acetic acid solution.  Findings: Adequate colposcopy is noted today.  TMZ zone present  Cervix: +acetowhite changes noted, no punctation, and no abnormal vasculature; ECC and cervical biopsies obtained.    Monsel's applied.  Adequate hemostasis noted  Specimens: ECC and cervical biopsies  Complications: none.  Colposcopic Impression: CIN1   Plan(Based on 2019 ASCCP recommendations)  -Discussed HPV- reviewed incidence and its potential to cause condylomas to dysplasia to cervical cancer -Reviewed degree of abnormal pap smears  -Discussed ASCCP guidelines and current recommendations for colposcopy -As above, inform consent obtained and procedure completed -biopsies obtained, further management pending results -Questions and concerns were addressed  Faith Rogue, DO Attending Obstetrician & Gynecologist, [facility]  for [employer], [facility]

## 2022-06-08 ENCOUNTER — Telehealth: Payer: Self-pay | Admitting: Obstetrics & Gynecology

## 2022-06-08 LAB — SURGICAL PATHOLOGY

## 2022-06-08 NOTE — Telephone Encounter (Signed)
Called patient with results of colposcopy.  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  Discussed recommendation to proceed with excision procedure.  Reviewed risk/benefit including but not limited to risk of bleeding, infection and injury such as cervical shortening.  Alternative option reviewed including f/u colposcopy in 2-67mos.  Pt desires to proceed with LEEP. Referral created  Myna Hidalgo, DO Attending Obstetrician & Gynecologist, [facility] for [place], [facility]

## 2022-06-09 ENCOUNTER — Encounter: Payer: Self-pay | Admitting: Obstetrics & Gynecology

## 2022-07-05 ENCOUNTER — Telehealth: Payer: Self-pay | Admitting: *Deleted

## 2022-07-05 NOTE — Telephone Encounter (Signed)
Called patient and advised that her Ins has finally approved her Dupixent after having to submit appeal. Will send to [employer] and they will reach out to ship to patient to restart therapy. Reviewed delivery, storage and dosing with patient

## 2022-07-05 NOTE — Telephone Encounter (Signed)
-----   Message from Hetty Blend, FNP sent at 05/28/2022 12:27 PM EDT ----- Hi Patricia [name], Cn you please resubmit this patient for Dupixent for AD. She has recently failed [place] and a topical steroid. Thank you

## 2022-07-08 NOTE — Patient Instructions (Addendum)
MEMPHIS CRESWELL  07/08/2022     @PREFPERIOPPHARMACY @   Your procedure is scheduled on  07/14/2022.   Report to 07/16/2022 at  0700  A.M.   Call this number if you have problems the morning of surgery:  [phone]   Remember:  Do not eat or drink after midnight.       Use your inhalers before you come and bring your rescue inhaler with you.     Take these medicines the morning of surgery with A SIP OF WATER                                          zyrtec.     Do not wear jewelry, make-up or nail polish.  Do not wear lotions, powders, or perfumes, or deodorant.  Do not shave 48 hours prior to surgery.  Men may shave face and neck.  Do not bring valuables to the hospital.  [facility] is not responsible for any belongings or valuables.  Contacts, dentures or bridgework may not be worn into surgery.  Leave your suitcase in the car.  After surgery it may be brought to your room.  For patients admitted to the hospital, discharge time will be determined by your treatment team.  Patients discharged the day of surgery will not be allowed to drive home and must have someone with them for 24 hours.     Special instructions:   DO NOT smoke tobacco or vape for 24 hours before your procedure.  Please read over the following fact sheets that you were given. Coughing and Deep Breathing, Surgical Site Infection Prevention, Anesthesia Post-op Instructions, and Care and Recovery After Surgery       LEEP POST-PROCEDURE INSTRUCTIONS  You may take Ibuprofen, Aleve or Tylenol for pain if needed.  Cramping is normal.  You will have black and/or bloody discharge at first.  This will lighten and then turn clear before completely resolving.  This will take 2 to 3 weeks.  Put nothing in your vagina until the bleeding or discharge stops (usually 2 or3 days).  You need to call if you have redness around the biopsy site, if there is any unusual draining, if the bleeding is heavy, or  if you are concerned.  Shower or bathe as normal  We will call you within one week with results or we will discuss the results at your follow-up appointment if needed.  You will need to return for a follow-up Pap smear as directed by your physician.General Anesthesia, Adult, Care After The following information offers guidance on how to care for yourself after your procedure. Your health care provider may also give you more specific instructions. If you have problems or questions, contact your health care provider. What can I expect after the procedure? After the procedure, it is common for people to: Have pain or discomfort at the IV site. Have nausea or vomiting. Have a sore throat or hoarseness. Have trouble concentrating. Feel cold or chills. Feel weak, sleepy, or tired (fatigue). Have soreness and body aches. These can affect parts of the body that were not involved in surgery. Follow these instructions at home: For the time period you were told by your health care provider:  Rest. Do not participate in activities where you could fall or become injured. Do not drive or use machinery. Do not  drink alcohol. Do not take sleeping pills or medicines that cause drowsiness. Do not make important decisions or sign legal documents. Do not take care of children on your own. General instructions Drink enough fluid to keep your urine pale yellow. If you have sleep apnea, surgery and certain medicines can increase your risk for breathing problems. Follow instructions from your health care provider about wearing your sleep device: Anytime you are sleeping, including during daytime naps. While taking prescription pain medicines, sleeping medicines, or medicines that make you drowsy. Return to your normal activities as told by your health care provider. Ask your health care provider what activities are safe for you. Take over-the-counter and prescription medicines only as told by your health  care provider. Do not use any products that contain nicotine or tobacco. These products include cigarettes, chewing tobacco, and vaping devices, such as e-cigarettes. These can delay incision healing after surgery. If you need help quitting, ask your health care provider. Contact a health care provider if: You have nausea or vomiting that does not get better with medicine. You vomit every time you eat or drink. You have pain that does not get better with medicine. You cannot urinate or have bloody urine. You develop a skin rash. You have a fever. Get help right away if: You have trouble breathing. You have chest pain. You vomit blood. These symptoms may be an emergency. Get help right away. Call 911. Do not wait to see if the symptoms will go away. Do not drive yourself to the hospital. Summary After the procedure, it is common to have a sore throat, hoarseness, nausea, vomiting, or to feel weak, sleepy, or fatigue. For the time period you were told by your health care provider, do not drive or use machinery. Get help right away if you have difficulty breathing, have chest pain, or vomit blood. These symptoms may be an emergency. This information is not intended to replace advice given to you by your health care provider. Make sure you discuss any questions you have with your health care provider. Document Revised: 02/05/2022 Document Reviewed: 02/05/2022 Elsevier Patient Education  2023 [employer].

## 2022-07-09 NOTE — Telephone Encounter (Signed)
Noted. Thank you. Routing to Thurston Hole also since she saw her last.

## 2022-07-12 ENCOUNTER — Encounter (HOSPITAL_COMMUNITY): Payer: Self-pay

## 2022-07-12 ENCOUNTER — Encounter (HOSPITAL_COMMUNITY)
Admission: RE | Admit: 2022-07-12 | Discharge: 2022-07-12 | Disposition: A | Discharge status: Home / Self Care | Payer: No Typology Code available for payment source | Source: Ambulatory Visit | Attending: Obstetrics & Gynecology | Admitting: Obstetrics & Gynecology

## 2022-07-12 DIAGNOSIS — R87613 High grade squamous intraepithelial lesion on cytologic smear of cervix (HGSIL): Secondary | ICD-10-CM | POA: Diagnosis not present

## 2022-07-12 DIAGNOSIS — Z01812 Encounter for preprocedural laboratory examination: Secondary | ICD-10-CM | POA: Diagnosis not present

## 2022-07-12 HISTORY — DX: Gastro-esophageal reflux disease without esophagitis: K21.9

## 2022-07-12 HISTORY — DX: Depression, unspecified: F32.A

## 2022-07-12 LAB — POCT PREGNANCY, URINE: Preg Test, Ur: NEGATIVE

## 2022-07-12 NOTE — H&P (Incomplete)
[facility]ulty [facility] Obstetrics and Gynecology Attending History and Physical  Patricia [name] is a 31 y.o. 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   Recent pap 03/22/22: HSIL, HPV+ Previous pap 05/2021- ASCUS, HPV+, colposcopy completed 06/2021 2021- ASCUS-H, HPV+   Today she notes ***  Denies any abnormal vaginal discharge, fevers, chills, sweats, dysuria, nausea, vomiting, other GI or GU symptoms or other general symptoms.  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 05/28/2020   Pap +HPV other and [facility], will need colpo per ASCCP guidelines,immediate  risk for CIN 3+ 12.6%    Asthma    Cough 08/01/2015   Fatty liver    Flexural atopic dermatitis 05/28/2022   HSIL (high grade squamous intraepithelial lesion) on Pap smear of cervix 06/04/2022   06/04/22 colpo per ASCCP guidelines, immediate risk is 23 %   Migraine    Migraines    Nausea and vomiting during pregnancy prior to [redacted] weeks gestation 08/01/2015   Obesity    PCO (polycystic ovaries) 03/08/2014   Polycystic disease, ovaries    Pregnancy induced hypertension    Pregnant 05/27/2015   URI (upper respiratory infection) 08/01/2015   Past Surgical History:  Procedure Laterality Date   BIOPSY  01/20/2021   Procedure: BIOPSY;  Surgeon: Lanelle Bal, DO;  Location: [facility];  Service: Endoscopy;;   CHOLECYSTECTOMY N/A 06/08/2017   Procedure: LAPAROSCOPIC CHOLECYSTECTOMY;  Surgeon: Franky Macho, MD;  Location: [facility] ORS;  Service: General;  Laterality: N/A;   ESOPHAGOGASTRODUODENOSCOPY (EGD) WITH PROPOFOL N/A 01/20/2021   LA Grade A reflux esophagitis, gastritis s/p biopsy, normal duodenum s/p biopsy. Negative H.pylori. negative celiac sprue.    sleeve gastrectomy     Dec 2022   TIF  08/2021   TONSILLECTOMY     WISDOM TOOTH EXTRACTION     OB History  Gravida Para Term  Preterm AB Living  3 2 2   1 2   SAB IAB Ectopic Multiple Live Births  1     0 2    # Outcome Date GA Lbr Len/2nd Weight Sex Delivery Anes PTL Lv  3 Term 04/14/17 [redacted]w[redacted]d 11:28 / 00:21 3374 g M Vag-Spont Other, EPI  LIV  2 Term 01/02/16 [redacted]w[redacted]d 31:10 3175 g M Vag-Spont EPI N LIV  1 SAB           Patient denies any other pertinent gynecologic issues.  No current facility-administered medications on file prior to encounter.   Current Outpatient Medications on File Prior to Encounter  Medication Sig Dispense Refill   acetaminophen (TYLENOL) 500 MG tablet Take 1,000 mg by mouth every 6 (six) hours as needed for moderate pain.     atomoxetine (STRATTERA) 40 MG capsule Take 40 mg by mouth daily.     b complex vitamins capsule Take 1 capsule by mouth daily.     Calcium Carb-Cholecalciferol (CALCIUM 500 + D PO) Take 2 tablets by mouth 2 (two) times daily.     Cetirizine HCl 10 MG TBDP Take 10 mg by mouth daily.     Ferrous Sulfate Dried (SLOW RELEASE IRON) 45 MG TBCR Take 45 mg by mouth daily.     fluticasone (FLONASE) 50 MCG/ACT nasal spray 1-2 sprays each nostril daily as needed for stuffy nose. 16 g 5   hydrocortisone 2.5 % ointment 1 application 2 times daily as needed to  red itchy areas. 30 g 5   metFORMIN (GLUCOPHAGE) 500 MG tablet Take 500 mg by mouth 2 (two) times daily with a meal.     Multiple Vitamins-Minerals (BARIATRIC MULTIVITAMINS/IRON PO) Take 1 tablet by mouth daily.     VENTOLIN HFA 108 (90 Base) MCG/ACT inhaler Inhale 2 puffs into the lungs every 6 (six) hours as needed for wheezing or shortness of breath. 18 g 1   chlorhexidine (PERIDEX) 0.12 % solution Use as directed 15 mLs in the mouth or throat 2 (two) times daily. (Patient not taking: Reported on 07/08/2022) 120 mL 0   clindamycin (CLEOCIN) 300 MG capsule Take 1 capsule (300 mg total) by mouth 2 (two) times daily. (Patient not taking: Reported on 07/08/2022) 14 capsule 0   EUCRISA 2 % OINT Apply 1 application. topically daily as  needed. (Patient not taking: Reported on 07/08/2022) 60 g 5   FLOVENT HFA 110 MCG/ACT inhaler Use 2 puffs 2 times daily for 1-2 weeks at a time until symptoms return to normal. (Patient not taking: Reported on 07/08/2022) 12 g 5   lidocaine (XYLOCAINE) 2 % solution Use as directed 10 mLs in the mouth or throat every 3 (three) hours as needed for mouth pain. (Patient not taking: Reported on 07/08/2022) 100 mL 0   Allergies  Allergen Reactions   Peanut-Containing Drug Products Anaphylaxis and Hives   Propranolol Shortness Of Breath   Cephalexin Hives   Imitrex [Sumatriptan] Hives   Nickel Rash    Social History:   reports that she has never smoked. She has never used smokeless tobacco. She reports current alcohol use. She reports that she does not use drugs. Family History  Problem Relation Age of Onset   Healthy Mother    Atrial fibrillation Father    Migraines Sister    Hypertension Brother    Heart disease Maternal Grandmother    Cancer Maternal Grandfather        prostate   Stroke Paternal Grandmother    Cancer Paternal Grandfather        prostate   Stroke Paternal Grandfather    Colon cancer Neg Hx    Allergic rhinitis Neg Hx    Angioedema Neg Hx    Asthma Neg Hx    Atopy Neg Hx    Eczema Neg Hx    Urticaria Neg Hx    Immunodeficiency Neg Hx     Review of Systems: Pertinent items noted in HPI and remainder of comprehensive ROS otherwise negative.  PHYSICAL EXAM: There were no vitals taken for this visit. CONSTITUTIONAL: Well-developed, well-nourished female in no acute distress.  SKIN: Skin is warm and dry. No rash noted. Not diaphoretic. No erythema. No pallor. NEUROLOGIC: Alert and oriented to person, place, and time. Normal reflexes, muscle tone coordination. No cranial nerve deficit noted. PSYCHIATRIC: Normal mood and affect. Normal behavior. Normal judgment and thought content. CARDIOVASCULAR: Normal heart rate noted, regular rhythm RESPIRATORY: Effort and breath  sounds normal, no problems with respiration noted ABDOMEN: Soft, nontender, nondistended. PELVIC: deferred MUSCULOSKELETAL: no calf tenderness bilaterally EXT: no edema bilaterally, normal pulses  Labs: No results found for this or any previous visit (from the past 336 hour(s)).  Imaging Studies: No results found.  Assessment: Active Problems:   * No active hospital problems. *   Plan: LEEP -NPO -LR @ 125cc/hr -SCDs to OR -Risk/benefits and alternatives reviewed with the patient including but not limited to risk of bleeding, infection and injury to surrounding organs.  Questions and concerns  were addressed and pt desires to proceed  Myna Hidalgo, DO Attending Obstetrician & Gynecologist, [facility] for [facility], [facility]

## 2022-07-14 ENCOUNTER — Ambulatory Visit (HOSPITAL_COMMUNITY)
Admission: RE | Admit: 2022-07-14 | Discharge: 2022-07-14 | Disposition: A | Discharge status: Home / Self Care | Payer: No Typology Code available for payment source | Attending: Obstetrics & Gynecology | Admitting: Obstetrics & Gynecology

## 2022-07-14 ENCOUNTER — Ambulatory Visit (HOSPITAL_COMMUNITY): Payer: No Typology Code available for payment source | Admitting: Certified Registered Nurse Anesthetist

## 2022-07-14 ENCOUNTER — Ambulatory Visit (HOSPITAL_BASED_OUTPATIENT_CLINIC_OR_DEPARTMENT_OTHER): Payer: No Typology Code available for payment source | Admitting: Certified Registered Nurse Anesthetist

## 2022-07-14 ENCOUNTER — Other Ambulatory Visit: Payer: Self-pay

## 2022-07-14 ENCOUNTER — Encounter (HOSPITAL_COMMUNITY): Payer: Self-pay | Admitting: Obstetrics & Gynecology

## 2022-07-14 ENCOUNTER — Encounter (HOSPITAL_COMMUNITY)
Admission: RE | Disposition: A | Discharge status: Home / Self Care | Payer: Self-pay | Source: Home / Self Care | Attending: Obstetrics & Gynecology

## 2022-07-14 DIAGNOSIS — J45909 Unspecified asthma, uncomplicated: Secondary | ICD-10-CM | POA: Diagnosis not present

## 2022-07-14 DIAGNOSIS — K219 Gastro-esophageal reflux disease without esophagitis: Secondary | ICD-10-CM | POA: Diagnosis not present

## 2022-07-14 DIAGNOSIS — D069 Carcinoma in situ of cervix, unspecified: Secondary | ICD-10-CM | POA: Insufficient documentation

## 2022-07-14 DIAGNOSIS — N870 Mild cervical dysplasia: Secondary | ICD-10-CM | POA: Diagnosis not present

## 2022-07-14 DIAGNOSIS — I10 Essential (primary) hypertension: Secondary | ICD-10-CM | POA: Insufficient documentation

## 2022-07-14 DIAGNOSIS — R87613 High grade squamous intraepithelial lesion on cytologic smear of cervix (HGSIL): Secondary | ICD-10-CM

## 2022-07-14 DIAGNOSIS — F32A Depression, unspecified: Secondary | ICD-10-CM | POA: Diagnosis not present

## 2022-07-14 HISTORY — PX: LEEP: SHX91

## 2022-07-14 SURGERY — LEEP (LOOP ELECTROSURGICAL EXCISION PROCEDURE)
Anesthesia: General | Site: Vagina

## 2022-07-14 MED ORDER — FENTANYL CITRATE (PF) 250 MCG/5ML IJ SOLN
INTRAMUSCULAR | Status: DC | PRN
  Administered 2022-07-14: 50 ug via INTRAVENOUS

## 2022-07-14 MED ORDER — MIDAZOLAM HCL 2 MG/2ML IJ SOLN
INTRAMUSCULAR | Status: AC
  Filled 2022-07-14: qty 2

## 2022-07-14 MED ORDER — 0.9 % SODIUM CHLORIDE (POUR BTL) OPTIME
TOPICAL | Status: DC | PRN
  Administered 2022-07-14: 1000 mL

## 2022-07-14 MED ORDER — LIDOCAINE-EPINEPHRINE 0.5 %-1:200000 IJ SOLN
INTRAMUSCULAR | Status: AC
  Filled 2022-07-14: qty 1

## 2022-07-14 MED ORDER — ONDANSETRON HCL 4 MG/2ML IJ SOLN: 4.0000 mg | Freq: Once | INTRAMUSCULAR | Status: DC | PRN

## 2022-07-14 MED ORDER — LACTATED RINGERS IV SOLN: INTRAVENOUS | Status: DC

## 2022-07-14 MED ORDER — PROPOFOL 10 MG/ML IV BOLUS
INTRAVENOUS | Status: DC | PRN
  Administered 2022-07-14: 200 mg via INTRAVENOUS

## 2022-07-14 MED ORDER — FENTANYL CITRATE (PF) 100 MCG/2ML IJ SOLN
INTRAMUSCULAR | Status: AC
  Filled 2022-07-14: qty 2

## 2022-07-14 MED ORDER — PROPOFOL 10 MG/ML IV BOLUS
INTRAVENOUS | Status: AC
  Filled 2022-07-14: qty 20

## 2022-07-14 MED ORDER — ONDANSETRON HCL 4 MG/2ML IJ SOLN
INTRAMUSCULAR | Status: DC | PRN
  Administered 2022-07-14: 4 mg via INTRAVENOUS

## 2022-07-14 MED ORDER — ACETIC ACID 4% SOLUTION
Status: DC | PRN
  Administered 2022-07-14: 1 via TOPICAL

## 2022-07-14 MED ORDER — LIDOCAINE-EPINEPHRINE 0.5 %-1:200000 IJ SOLN
INTRAMUSCULAR | Status: DC | PRN
  Administered 2022-07-14: 40 mL

## 2022-07-14 MED ORDER — FERRIC SUBSULFATE (BULK) SOLN
Status: DC | PRN
  Administered 2022-07-14: 1

## 2022-07-14 MED ORDER — IBUPROFEN 600 MG PO TABS
600.0000 mg | ORAL_TABLET | Freq: Four times a day (QID) | ORAL | 0 refills | Status: DC | PRN
Start: 2022-07-14 — End: 2023-01-05

## 2022-07-14 MED ORDER — FERRIC SUBSULFATE 259 MG/GM EX SOLN
CUTANEOUS | Status: AC
  Filled 2022-07-14: qty 8

## 2022-07-14 MED ORDER — MIDAZOLAM HCL 2 MG/2ML IJ SOLN
INTRAMUSCULAR | Status: DC | PRN
  Administered 2022-07-14: 2 mg via INTRAVENOUS

## 2022-07-14 MED ORDER — POVIDONE-IODINE 10 % EX SWAB: 2.0000 | Freq: Once | CUTANEOUS | Status: DC

## 2022-07-14 MED ORDER — LIDOCAINE HCL (CARDIAC) PF 100 MG/5ML IV SOSY
PREFILLED_SYRINGE | INTRAVENOUS | Status: DC | PRN
  Administered 2022-07-14: 60 mg via INTRAVENOUS

## 2022-07-14 MED ORDER — FENTANYL CITRATE PF 50 MCG/ML IJ SOSY: 25.0000 ug | PREFILLED_SYRINGE | INTRAMUSCULAR | Status: DC | PRN

## 2022-07-14 SURGICAL SUPPLY — 32 items
APL SWBSTK 6 STRL LF DISP (MISCELLANEOUS) ×1
APPLICATOR COTTON TIP 6 STRL (MISCELLANEOUS) ×1 IMPLANT
APPLICATOR COTTON TIP 6IN STRL (MISCELLANEOUS) ×1
CLOTH BEACON ORANGE TIMEOUT ST (SAFETY) ×1 IMPLANT
COVER SURGICAL LIGHT HANDLE (MISCELLANEOUS) ×2 IMPLANT
ELECT BALL LEEP 5MM RED (ELECTRODE) ×1 IMPLANT
ELECT LOOP LEEP RND 20X12 WHT (CUTTING LOOP) ×1
ELECT REM PT RETURN 9FT ADLT (ELECTROSURGICAL) ×1
ELECTRODE LOOP LP RND 20X12WHT (CUTTING LOOP) IMPLANT
ELECTRODE REM PT RTRN 9FT ADLT (ELECTROSURGICAL) ×1 IMPLANT
GAUZE 4X4 16PLY ~~LOC~~+RFID DBL (SPONGE) ×2 IMPLANT
GLOVE BIO SURGEON STRL SZ 6.5 (GLOVE) ×1 IMPLANT
GLOVE BIO SURGEON STRL SZ7 (GLOVE) ×1 IMPLANT
GLOVE BIOGEL PI IND STRL 7.0 (GLOVE) ×3 IMPLANT
GLOVE BIOGEL PI INDICATOR 7.0 (GLOVE) ×3
GLOVE SURG LTX SZ6.5 (GLOVE) ×1 IMPLANT
GOWN STRL REUS W/ TWL LRG LVL3 (GOWN DISPOSABLE) ×1 IMPLANT
GOWN STRL REUS W/TWL LRG LVL3 (GOWN DISPOSABLE) ×2 IMPLANT
KIT TURNOVER CYSTO (KITS) ×1 IMPLANT
NDL HYPO 18GX1.5 BLUNT FILL (NEEDLE) ×1 IMPLANT
NEEDLE HYPO 18GX1.5 BLUNT FILL (NEEDLE) ×1 IMPLANT
NS IRRIG 1000ML POUR BTL (IV SOLUTION) ×1 IMPLANT
NS IRRIG 500ML POUR BTL (IV SOLUTION) ×1 IMPLANT
PACK PERI GYN (CUSTOM PROCEDURE TRAY) ×1 IMPLANT
PAD ARMBOARD 7.5X6 YLW CONV (MISCELLANEOUS) ×2 IMPLANT
PAD TELFA 3X4 1S STER (GAUZE/BANDAGES/DRESSINGS) ×1 IMPLANT
SCOPETTES 8  STERILE (MISCELLANEOUS) ×1
SCOPETTES 8 STERILE (MISCELLANEOUS) ×1 IMPLANT
SET BASIN LINEN APH (SET/KITS/TRAYS/PACK) ×1 IMPLANT
SUT VIC AB 0 CT1 27 (SUTURE) ×1
SUT VIC AB 0 CT1 27XBRD ANBCTR (SUTURE) ×1 IMPLANT
SYR CONTROL 10ML LL (SYRINGE) ×1 IMPLANT

## 2022-07-14 NOTE — Anesthesia Preprocedure Evaluation (Signed)
Anesthesia Evaluation  Patient identified by MRN, date of birth, ID band Patient awake    Reviewed: Allergy & Precautions, H&P , NPO status , Patient's Chart, lab work & pertinent test results, reviewed documented beta blocker date and time   Airway Mallampati: II  TM Distance: >3 FB Neck ROM: full    Dental no notable dental hx.    Pulmonary asthma ,    Pulmonary exam normal breath sounds clear to auscultation       Cardiovascular Exercise Tolerance: Good hypertension, negative cardio ROS   Rhythm:regular Rate:Normal     Neuro/Psych  Headaches, PSYCHIATRIC DISORDERS Depression    GI/Hepatic Neg liver ROS, GERD  Medicated,  Endo/Other  negative endocrine ROS  Renal/GU negative Renal ROS  negative genitourinary   Musculoskeletal   Abdominal   Peds  Hematology negative hematology ROS (+)   Anesthesia Other Findings   Reproductive/Obstetrics negative OB ROS                             Anesthesia Physical Anesthesia Plan  ASA: 3  Anesthesia Plan: General   Post-op Pain Management:    Induction:   PONV Risk Score and Plan: Ondansetron  Airway Management Planned:   Additional Equipment:   Intra-op Plan:   Post-operative Plan:   Informed Consent: I have reviewed the patients History and Physical, chart, labs and discussed the procedure including the risks, benefits and alternatives for the proposed anesthesia with the patient or authorized representative who has indicated his/her understanding and acceptance.     Dental Advisory Given  Plan Discussed with: CRNA  Anesthesia Plan Comments:         Anesthesia Quick Evaluation

## 2022-07-14 NOTE — Discharge Instructions (Signed)
HOME INSTRUCTIONS  Please note any unusual or excessive bleeding, pain, swelling. Mild dizziness or drowsiness are normal for about 24 hours after surgery.   Shower when comfortable  Restrictions: No driving for 24 hours or while taking pain medications.  Activity:  No heavy lifting (> 10 lbs), nothing in vagina (no tampons, douching, or intercourse) x 4 weeks; no tub baths for 4 weeks Vaginal spotting is expected but if your bleeding is heavy, period like,  please call the office   Diet:  You may return to your regular diet.  Do not eat large meals.  Eat small frequent meals throughout the day.  Continue to drink a good amount of water at least 6-8 glasses of water per day, hydration is very important for the healing process.  Pain Management: Take over the counter tylenol or ibuprofen as needed for pain.  You can either take one or alternate between the two medications for pain management.  You may also use a heating pack as needed.    Alcohol -- Avoid for 24 hours and while taking pain medications.  Nausea: Take sips of ginger ale or soda  Fever -- Call physician if temperature over 101 degrees  Follow up:  If you do not already have a follow up appointment scheduled, please call the office at [phone].  If you experience fever (a temperature greater than 100.4), pain unrelieved by pain medication, shortness of breath, swelling of a single leg, or any other symptoms which are concerning to you please the office immediately.  

## 2022-07-14 NOTE — Anesthesia Procedure Notes (Addendum)
Procedure Name: LMA Insertion Date/Time: 07/14/2022 8:40 AM  Performed by: Lorin Glass, CRNAPre-anesthesia Checklist: Emergency Drugs available, Patient identified, Suction available and Patient being monitored Patient Re-evaluated:Patient Re-evaluated prior to induction Oxygen Delivery Method: Circle system utilized Preoxygenation: Pre-oxygenation with 100% oxygen Induction Type: IV induction LMA: LMA inserted LMA Size: 4.0 Number of attempts: 1 Placement Confirmation: positive ETCO2 and breath sounds checked- equal and bilateral Tube secured with: Tape Dental Injury: Teeth and Oropharynx as per pre-operative assessment

## 2022-07-14 NOTE — Op Note (Signed)
OPERATIVE NOTE  PREOPERATIVE DIAGNOSIS:  1) CIN 3 POSTOPERATIVE DIAGNOSIS: same PROCEDURE PERFORMED: Colposcopy, LEEP SURGEON: Dr. Myna Hidalgo ANESTHESIA: General endotracheal.  ESTIMATED BLOOD LOSS: minimal.  IV FLUIDS: 500cc of crystalloid.  SPECIMEN(S): cervical biopsy with inner margins COMPLICATIONS: None.  CONDITION: Stable.  FINDINGS: Acetowhite changes with mosaicism noted @ 12 o'clock  ??Procedure: Informed consent was obtained from the patient prior to taking her to the operating room where anesthesia was found to be adequate. She was placed in dorsal lithotomy.  She was prepped and draped in normal sterile fashion.  A bivalved coated speculum was placed in the patient's vagina. A grounding pad placed on the patient. Acetic acid solution was applied to the cervix and areas of decreased uptake were noted around the transformation zone.   Local anesthesia was administered via an intracervical block using 40 ml of 0.5% Lidocaine with epinephrine. The suction was turned on and the Large 1X Fisher Cone Biopsy Excisor on 50 Watts of blended current was used to excise the area of decreased uptake and excise the entire transformation zone.  Excellent hemostasis was achieved using roller ball coagulation set at 50 Watts coagulation current. Monsel's solution was then applied and the speculum was removed from the vagina. Specimens were sent to pathology.  Counts were correct and pt was taken to recovery room in stable condition.  Myna Hidalgo, DO Attending Obstetrician & Gynecologist, [facility] for [employer], [facility]

## 2022-07-14 NOTE — Transfer of Care (Signed)
Immediate Anesthesia Transfer of Care Note  Patient: Patricia [name]  Procedure(s) Performed: LOOP ELECTROSURGICAL EXCISION PROCEDURE (LEEP) (Vagina )  Patient Location: PACU  Anesthesia Type:General  Level of Consciousness: awake, alert  and oriented  Airway & Oxygen Therapy: Patient Spontanous Breathing and Patient connected to nasal cannula oxygen  Post-op Assessment: Report given to RN and Post -op Vital signs reviewed and stable  Post vital signs: Reviewed and stable  Last Vitals:  Vitals Value Taken Time  BP 132/69 07/14/22 0912  Temp 97.7   Pulse 87 07/14/22 0913  Resp 19 07/14/22 0913  SpO2 98 % 07/14/22 0913  Vitals shown include unvalidated device data.  Last Pain:  Vitals:   07/14/22 0750  TempSrc: Oral  PainSc: 0-No pain      Patients Stated Pain Goal: 8 (07/14/22 0750)  Complications: No notable events documented.

## 2022-07-15 NOTE — Anesthesia Postprocedure Evaluation (Signed)
Anesthesia Post Note  Patient: Patricia [name]  Procedure(s) Performed: LOOP ELECTROSURGICAL EXCISION PROCEDURE (LEEP) (Vagina )  Patient location during evaluation: PACU Anesthesia Type: General Level of consciousness: awake and alert Pain management: pain level controlled Vital Signs Assessment: post-procedure vital signs reviewed and stable Respiratory status: spontaneous breathing, nonlabored ventilation, respiratory function stable and patient connected to nasal cannula oxygen Cardiovascular status: blood pressure returned to baseline and stable Postop Assessment: no apparent nausea or vomiting Anesthetic complications: no Comments: Late entry   No notable events documented.   Last Vitals:  Vitals:   07/14/22 0930 07/14/22 0946  BP: 133/82 131/73  Pulse: 80 70  Resp: (!) 25 19  Temp:  36.6 C  SpO2: 98% 100%    Last Pain:  Vitals:   07/14/22 0946  TempSrc: Oral  PainSc: 0-No pain                 Windell Norfolk

## 2022-07-20 ENCOUNTER — Encounter (HOSPITAL_COMMUNITY): Payer: Self-pay | Admitting: Obstetrics & Gynecology

## 2022-07-21 LAB — SURGICAL PATHOLOGY

## 2022-07-28 ENCOUNTER — Ambulatory Visit (INDEPENDENT_AMBULATORY_CARE_PROVIDER_SITE_OTHER): Payer: No Typology Code available for payment source | Admitting: Obstetrics & Gynecology

## 2022-07-28 ENCOUNTER — Encounter: Payer: Self-pay | Admitting: Obstetrics & Gynecology

## 2022-07-28 ENCOUNTER — Other Ambulatory Visit (HOSPITAL_COMMUNITY)
Admission: RE | Admit: 2022-07-28 | Discharge: 2022-07-28 | Disposition: A | Discharge status: Home / Self Care | Payer: No Typology Code available for payment source | Source: Ambulatory Visit | Attending: Obstetrics & Gynecology | Admitting: Obstetrics & Gynecology

## 2022-07-28 VITALS — BP 112/79 | HR 71 | Ht 64.0 in | Wt 229.0 lb

## 2022-07-28 DIAGNOSIS — Z9889 Other specified postprocedural states: Secondary | ICD-10-CM | POA: Insufficient documentation

## 2022-07-28 DIAGNOSIS — N898 Other specified noninflammatory disorders of vagina: Secondary | ICD-10-CM | POA: Diagnosis present

## 2022-07-28 NOTE — Progress Notes (Signed)
   GYN VISIT Patient name: Patricia [name] MRN [id number]  Date of birth: 12-May-1991 Chief Complaint:   Routine Post Op  History of Present Illness:   Patricia [name] is a 31 y.o. [id number] female being seen today for s/p LEEP completed on 8/23  Today she notes notes occasional twinge of bleeding.  Denies itching/burning.  + odor- not fishy, just different  Reviewed final pathology- CIN 1.     Patient's last menstrual period was 06/16/2022 (approximate).     05/31/2022    8:37 AM 05/26/2021    8:48 AM 12/31/2020    8:42 AM 10/15/2020    9:47 AM 07/15/2020    8:35 AM  Depression screen PHQ 2/9  Decreased Interest 0 0 0 1 0  Down, Depressed, Hopeless 0 0 0 0 0  PHQ - 2 Score 0 0 0 1 0  Altered sleeping 0 0 2 1 0  Tired, decreased energy 0 0 1 0 0  Change in appetite 0 1 3 0 0  Feeling bad or failure about yourself  0 0 0 0 0  Trouble concentrating 1 1 2 1 3   Moving slowly or fidgety/restless 0 0 0 0 0  Suicidal thoughts 0 0 0 0 0  PHQ-9 Score 1 2 8 3 3   Difficult doing work/chores   Somewhat difficult  Somewhat difficult     Review of Systems:   Pertinent items are noted in HPI Denies fever/chills, dizziness, headaches, visual disturbances, fatigue, shortness of breath, chest pain, abdominal pain, vomiting, no problems with periods, bowel movements, urination, or intercourse unless otherwise stated above.  Pertinent History Reviewed:  Reviewed past medical,surgical, social, obstetrical and family history.  Reviewed problem list, medications and allergies. Physical Assessment:   Vitals:   07/28/22 1357  BP: 112/79  Pulse: 71  Weight: 229 lb (103.9 kg)  Height: 5\' 4"  (1.626 m)  Body mass index is 39.31 kg/m.       Physical Examination:   General appearance: alert, well appearing, and in no distress  Psych: mood appropriate, normal affect  Skin: warm & dry   Cardiovascular: normal heart rate noted  Respiratory: normal respiratory effort, no distress  Abdomen: soft,  non-tender   Pelvic: VULVA: normal appearing vulva with no masses, tenderness or lesions, VAGINA: normal appearing vagina with normal color and discharge, no lesions, CERVIX: healing appropriately- no bleeding or discharge noted  Extremities: no edema   Chaperone: 09/27/22    Assessment & Plan:  1)s/p LEEP -healing appropriately -advised pelvic rest for another 2 weeks -plan for repeat pap in 31yr - will r/o underlying BV infection  2) Family planning -reviewed her concerns, plans to start trying in the spring   No orders of the defined types were placed in this encounter.   Return in about 1 year (around 07/29/2023) for Annual.   3yr, DO Attending Obstetrician & Gynecologist, [facility] for [facility], [facility]

## 2022-07-28 NOTE — Addendum Note (Signed)
Addended by: Annamarie Dawley on: 07/28/2022 03:11 PM   Modules accepted: Orders

## 2022-07-30 LAB — CERVICOVAGINAL ANCILLARY ONLY
Bacterial Vaginitis (gardnerella): NEGATIVE
Candida Glabrata: NEGATIVE
Candida Vaginitis: NEGATIVE
Comment: NEGATIVE
Comment: NEGATIVE
Comment: NEGATIVE

## 2022-08-04 ENCOUNTER — Encounter: Payer: Self-pay | Admitting: Obstetrics & Gynecology

## 2022-10-13 ENCOUNTER — Ambulatory Visit (INDEPENDENT_AMBULATORY_CARE_PROVIDER_SITE_OTHER): Payer: No Typology Code available for payment source | Admitting: Allergy & Immunology

## 2022-10-13 ENCOUNTER — Encounter: Payer: Self-pay | Admitting: Allergy & Immunology

## 2022-10-13 ENCOUNTER — Other Ambulatory Visit: Payer: Self-pay

## 2022-10-13 VITALS — BP 110/70 | HR 82 | Resp 16 | Ht 64.0 in | Wt 220.8 lb

## 2022-10-13 DIAGNOSIS — J302 Other seasonal allergic rhinitis: Secondary | ICD-10-CM

## 2022-10-13 DIAGNOSIS — T7800XD Anaphylactic reaction due to unspecified food, subsequent encounter: Secondary | ICD-10-CM | POA: Diagnosis not present

## 2022-10-13 DIAGNOSIS — J3089 Other allergic rhinitis: Secondary | ICD-10-CM

## 2022-10-13 DIAGNOSIS — J4530 Mild persistent asthma, uncomplicated: Secondary | ICD-10-CM | POA: Diagnosis not present

## 2022-10-13 DIAGNOSIS — L2089 Other atopic dermatitis: Secondary | ICD-10-CM | POA: Diagnosis not present

## 2022-10-13 MED ORDER — TRIAMCINOLONE ACETONIDE 0.5 % EX OINT: 1.0000 | TOPICAL_OINTMENT | Freq: Two times a day (BID) | CUTANEOUS | 5 refills | Status: DC

## 2022-10-13 NOTE — Patient Instructions (Addendum)
1. Anaphylactic shock due to food - Continue with your daily dosing of Reese's pieces.   2. Seasonal and perennial allergic rhinitis - Continue with cetirizine 10mg  daily. - Continue with fluticasone one spray per nostril up to twice daily as needed.   3. Flexural atopic dermatitis - Continue with Dupixent every two weeks.  - Continue with moisturizing twice daily.  - Add on triamcinolone 0.5% ointment twice daily on the lesion on your hand.   4. Mild persistent asthma, uncomplicated  - Spirometry looked fairly good. - Spacer use reviewed. - Daily controller medication(s): NOTHING  - Prior to physical activity: albuterol 2 puffs 10-15 minutes before physical activity. - Rescue medications: albuterol 4 puffs every 4-6 hours as needed - Changes during respiratory infections or worsening symptoms: Add on Flovent to 2 puffs twice daily for TWO WEEKS. - Asthma control goals:  * Full participation in all desired activities (may need albuterol before activity) * Albuterol use two time or less a week on average (not counting use with activity) * Cough interfering with sleep two time or less a month * Oral steroids no more than once a year * No hospitalizations  5. Return in about 1 year (around 10/14/2023).    Please inform 10/16/2023 of any Emergency Department visits, hospitalizations, or changes in symptoms. Call us before going to the ED for breathing or allergy symptoms since we might be able to fit you in for a sick visit. Feel free to contact us anytime with any questions, problems, or concerns.  It was a pleasure to see you again today! We LOVE you guys!   Websites that have reliable patient information: 1. American Academy of Asthma, Allergy, and Immunology: www.aaaai.org 2. Food Allergy Research and Education (FARE): foodallergy.org 3. Mothers of Asthmatics: http://www.asthmacommunitynetwork.org 4. American College of Allergy, Asthma, and Immunology: www.acaai.org   COVID-19  Vaccine Information can be found at: [place] For questions related to vaccine distribution or appointments, please email [email or web address] or call [phone].   We realize that you might be concerned about having an allergic reaction to the COVID19 vaccines. To help with that concern, WE ARE OFFERING THE COVID19 VACCINES IN OUR OFFICE! Ask the front desk for dates!     "Like" [phone] on Facebook and Instagram for our latest updates!      A healthy democracy works best when [place] participate! Make sure you are registered to vote! If you have moved or changed any of your contact information, you will need to get this updated before voting!  In some cases, you MAY be able to register to vote online: [employer]

## 2022-10-13 NOTE — Progress Notes (Signed)
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 Anaphylactic shock due to food - Continue with your daily dosing of Reese's pieces.   2. Seasonal and perennial allergic rhinitis - Continue with cetirizine 10mg  daily. - Continue with fluticasone one spray per nostril up to twice daily as needed.   3. Flexural atopic dermatitis - Continue with Dupixent every two weeks.  - Continue with moisturizing twice daily.  - Add on triamcinolone 0.5% ointment twice daily on the lesion on your hand.   4. Mild persistent asthma, uncomplicated  - Spirometry looked fairly good. - Spacer use reviewed. - Daily controller medication(s): NOTHING  - Prior to physical activity: albuterol 2 puffs 10-15 minutes before physical activity. - Rescue medications: albuterol 4 puffs every 4-6 hours as needed - Changes during respiratory infections or worsening symptoms: Add on Flovent to 2 puffs twice daily for TWO WEEKS. - Asthma control goals:  * Full participation in all desired activities (may need albuterol before activity) * Albuterol use two time or less a week on average (not counting use with activity) * Cough interfering with sleep two time or less a month * Oral steroids no more than once a year * No hospitalizations  5. Return in about 1 year (around 10/14/2023).    Subjective:   Patricia [name] is a 31 y.o. female presenting today for follow up of  Chief Complaint  Patient presents with   Asthma    Patricia [name] has a history of the following: Patient Active Problem List   Diagnosis Date Noted   HSIL (high grade squamous intraepithelial lesion) on Pap smear of cervix 06/04/2022   ASCUS with positive high risk  HPV cervical 05/31/2022   Urinary incontinence 05/31/2022   Routine general medical examination at a health care facility 05/31/2022   Mixed stress and urge urinary incontinence 05/31/2022   Mild persistent asthma, uncomplicated 05/28/2022   Flexural atopic dermatitis 05/28/2022   Pap smear of cervix with ASCUS, cannot exclude HGSIL 05/26/2021   Hip pain, right 05/26/2021   Family planning 05/26/2021   RUQ pain 04/22/2021   Esophagitis 04/22/2021   Pelvic pain 11/28/2020   Hypertension 07/15/2020   Abnormal Papanicolaou smear of cervix with positive human papilloma virus (HPV) test 05/28/2020   Nexplanon removal 05/20/2020   Encounter for gynecological examination with Papanicolaou smear of cervix 05/20/2020   Depression 05/20/2020   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   Fatty liver 11/29/2019   Diarrhea 09/27/2019   Elevated LFTs 06/17/2017   Calculus of gallbladder with acute cholecystitis without obstruction    Nausea vomiting and diarrhea    RUQ abdominal pain    Transaminitis    Biliary colic    Gall stones, common bile duct    Abdominal pain 06/04/2017   Chronic hypertension complicating or reason for care during childbirth 04/13/2017   Chronic hypertension in pregnancy 04/13/2017   Chronic hypertension affecting pregnancy 11/08/2016   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  Patricia [name] is a 31 y.o. female presenting for a follow up visit.  She was last seen in July 2023 by Thermon Leyland, one of the  nurse practitioners.  At that time, her asthma was under good control with albuterol 2 puffs as needed and Flovent added during flares.  For her allergic rhinitis, she was continued on cetirizine as well as Flonase and nasal saline rinses.  For atopic dermatitis, she was continued on [place].  Since last visit, she has done very well.   Asthma/Respiratory Symptom History: She has been doing well with her asthma.  [name]'s asthma has been well controlled. She has not required rescue medication, experienced nocturnal awakenings due to lower respiratory symptoms, nor have activities of daily living been limited. She has required no Emergency Department or Urgent Care visits for her asthma. She has required zero courses of systemic steroids for asthma exacerbations since the last visit. ACT score today is 25, indicating excellent asthma symptom control.   Allergic Rhinitis Symptom History: She remains on the cetirizine and the fluticasone.  This is working well to control her symptoms. She has not been on antibiotics for any sinus infections. She is overall doing very well with her symptom control.   Food Allergy Symptom History: She does eat nuts, but she does the Reees's Pieces.  She does this every day as part of her dosing for her peanut OIT. She is very happy to have this off of her allergy list.   Skin Symptom History: She did restart the Dupixent.  She started it around April or May 2023 again. She has been using hydrocortisone for the lesion on her right hand. This is better than it was prefviously. She has not been on systemic steroids at all since the last visit for her skin. She has not been to see Dermatology.   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 She is doing very well with this and has made some extensive lifestyle changes.   Otherwise, there have been no changes to her past medical history, surgical history, family history, or social  history.    Review of Systems  Constitutional: Negative.  Negative for chills, fever, malaise/fatigue and weight loss.  HENT: Negative.  Negative for congestion, ear discharge and ear pain.   Eyes:  Negative for pain, discharge and redness.  Respiratory:  Negative for cough, sputum production, shortness of breath and wheezing.   Cardiovascular: Negative.  Negative for chest pain and palpitations.  Gastrointestinal:  Negative for abdominal pain, constipation, diarrhea, heartburn, nausea and vomiting.  Skin: Negative.  Negative for itching and rash.  Neurological:  Negative for dizziness and headaches.  Endo/Heme/Allergies:  Positive for environmental allergies. Negative for polydipsia. Does not bruise/bleed easily.       Objective:   Blood pressure 110/70, pulse 82, resp. rate 16, height 5\' 4"  (1.626 m), weight 220 lb 12.8 oz (100.2 kg), SpO2 97 %. Body mass index is 37.9 kg/m.    Physical Exam Constitutional:      Appearance: She is well-developed.     Comments: Very lovely as always. Talkative. She has lost a lot of weight since we last saw her.   HENT:     Head: Normocephalic and atraumatic.  Right Ear: Tympanic membrane, ear canal and external ear normal.     Left Ear: Tympanic membrane, ear canal and external ear normal.     Nose: No nasal deformity, septal deviation, mucosal edema or rhinorrhea.     Right Turbinates: Enlarged, swollen and pale.     Left Turbinates: Enlarged, swollen and pale.     Right Sinus: No maxillary sinus tenderness or frontal sinus tenderness.     Left Sinus: No maxillary sinus tenderness or frontal sinus tenderness.     Comments: No nasal polyps noted.     Mouth/Throat:     Mouth: Mucous membranes are not pale and not dry.     Pharynx: Uvula midline.  Eyes:     General:        Right eye: No discharge.        Left eye: No discharge.     Conjunctiva/sclera: Conjunctivae normal.     Right eye: Right conjunctiva is not injected. No  chemosis.    Left eye: Left conjunctiva is not injected. No chemosis.    Pupils: Pupils are equal, round, and reactive to light.  Cardiovascular:     Rate and Rhythm: Normal rate and regular rhythm.     Heart sounds: Normal heart sounds.  Pulmonary:     Effort: Pulmonary effort is normal. No tachypnea, accessory muscle usage or respiratory distress.     Breath sounds: Normal breath sounds. No wheezing, rhonchi or rales.  Chest:     Chest wall: No tenderness.  Lymphadenopathy:     Cervical: No cervical adenopathy.  Skin:    General: Skin is warm.     Capillary Refill: Capillary refill takes less than 2 seconds.     Coloration: Skin is not pale.     Findings: No abrasion, erythema, petechiae or rash. Rash is not papular, urticarial or vesicular.     Comments: Her hands look particularly good today. The lesions that was on her right hand has since cleared completely.   Neurological:     Mental Status: She is alert.      Diagnostic studies:    Spirometry: results abnormal (FEV1: 2.25/82%, FVC: 3.058/95%, FEV1/FVC: 73%).    Spirometry consistent with normal pattern.   Allergy Studies: none        [name], MD  [facility]

## 2022-11-03 ENCOUNTER — Ambulatory Visit
Admission: EM | Admit: 2022-11-03 | Discharge: 2022-11-03 | Disposition: A | Discharge status: Home / Self Care | Payer: No Typology Code available for payment source | Attending: Nurse Practitioner | Admitting: Nurse Practitioner

## 2022-11-03 DIAGNOSIS — K0889 Other specified disorders of teeth and supporting structures: Secondary | ICD-10-CM

## 2022-11-03 MED ORDER — CLINDAMYCIN HCL 300 MG PO CAPS: 300.0000 mg | ORAL_CAPSULE | Freq: Three times a day (TID) | ORAL | 0 refills | Status: AC

## 2022-11-03 NOTE — ED Provider Notes (Signed)
[facility]    CSN: [id number] Arrival date & time: 11/03/22  1557      History   Chief Complaint Chief Complaint  Patient presents with   Dental Pain   Headache         HPI Patricia [name] is a 31 y.o. female.   The history is provided by the patient.   Patient presents for complaints of right-sided upper mouth pain, fatigue, and headache that been present over the last week.  Patient states that she was started on amoxicillin by her dentist for dental infection almost 1 week ago.  She states that she is concerned that her infection may be spreading.  She states she has pain that radiates into the right neck behind the right ear, and she has a dull right-sided headache.  She also states she has had increased fatigue over the past 5 days.  She denies fever, abdominal pain, nausea, vomiting, or diarrhea.  She states that she has not felt like eating as much.  She states that she did reach out to her dentist, but they do not have an appointment until 11/08/2022.  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 05/28/2020   Pap +HPV other and [facility], will need colpo per ASCCP guidelines,immediate  risk for CIN 3+ 12.6%    Asthma    Cough 08/01/2015   Depression    Fatty liver    Flexural atopic dermatitis 05/28/2022   GERD (gastroesophageal reflux disease)    HSIL (high grade squamous intraepithelial lesion) on Pap smear of cervix 06/04/2022   06/04/22 colpo per ASCCP guidelines, immediate risk is 23 %   Migraine    Migraines    Nausea and vomiting during pregnancy prior to [redacted] weeks gestation 08/01/2015   Obesity    PCO (polycystic ovaries) 03/08/2014   Polycystic disease, ovaries    Pregnancy induced hypertension    Pregnant 05/27/2015   URI (upper respiratory infection) 08/01/2015    Patient Active Problem List   Diagnosis Date Noted   HSIL (high grade squamous intraepithelial  lesion) on Pap smear of cervix 06/04/2022   ASCUS with positive high risk HPV cervical 05/31/2022   Urinary incontinence 05/31/2022   Routine general medical examination at a health care facility 05/31/2022   Mixed stress and urge urinary incontinence 05/31/2022   Mild persistent asthma, uncomplicated 05/28/2022   Flexural atopic dermatitis 05/28/2022   Pap smear of cervix with ASCUS, cannot exclude HGSIL 05/26/2021   Hip pain, right 05/26/2021   Family planning 05/26/2021   RUQ pain 04/22/2021   Esophagitis 04/22/2021   Pelvic pain 11/28/2020   Hypertension 07/15/2020   Abnormal Papanicolaou smear of cervix with positive human papilloma virus (HPV) test 05/28/2020   Nexplanon removal 05/20/2020   Encounter for gynecological examination with Papanicolaou smear of cervix 05/20/2020   Depression 05/20/2020   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   Fatty liver 11/29/2019   Diarrhea 09/27/2019   Elevated LFTs 06/17/2017   Calculus of gallbladder with acute cholecystitis without obstruction    Nausea vomiting and diarrhea    RUQ abdominal pain    Transaminitis    Biliary colic    Gall stones, common bile duct    Abdominal pain 06/04/2017   Chronic hypertension complicating or reason for care during childbirth 04/13/2017   Chronic hypertension in pregnancy 04/13/2017   Chronic hypertension affecting pregnancy 11/08/2016   Chromosomal  abnormality XXY by [employer] 10/19/2016   Supervision of high risk pregnancy, antepartum 09/27/2016   History of gestational hypertension 09/27/2016   History of shoulder dystocia in prior pregnancy, currently pregnant 09/27/2016   URI (upper respiratory infection) 08/01/2015   Persistent cough 08/01/2015   Obesity, Class III, BMI 40-49.9 (morbid obesity) (HCC) 09/23/2014   Headache 09/23/2014   PCO (polycystic ovaries) 03/08/2014    Past Surgical History:  Procedure Laterality Date   BIOPSY  01/20/2021   Procedure: BIOPSY;   Surgeon: Lanelle Bal, DO;  Location: [facility];  Service: Endoscopy;;   CHOLECYSTECTOMY N/A 06/08/2017   Procedure: LAPAROSCOPIC CHOLECYSTECTOMY;  Surgeon: Franky Macho, MD;  Location: [facility];  Service: General;  Laterality: N/A;   ESOPHAGOGASTRODUODENOSCOPY (EGD) WITH PROPOFOL N/A 01/20/2021   LA Grade A reflux esophagitis, gastritis s/p biopsy, normal duodenum s/p biopsy. Negative H.pylori. negative celiac sprue.    LEEP N/A 07/14/2022   Procedure: LOOP ELECTROSURGICAL EXCISION PROCEDURE (LEEP);  Surgeon: Myna Hidalgo, DO;  Location: [facility];  Service: Gynecology;  Laterality: N/A;   sleeve gastrectomy     Dec 2022   TIF  08/2021   TONSILLECTOMY     WISDOM TOOTH EXTRACTION      OB History     Gravida  3   Para  2   Term  2   Preterm      AB  1   Living  2      SAB  1   IAB      Ectopic      Multiple  0   Live Births  2            Home Medications    Prior to Admission medications   Medication Sig Start Date End Date Taking? Authorizing Provider  clindamycin (CLEOCIN) 300 MG capsule Take 1 capsule (300 mg total) by mouth 3 (three) times daily for 7 days. 11/03/22 11/10/22 Yes Clarissia Mckeen-Warren, Sadie Haber, NP  acetaminophen (TYLENOL) 500 MG tablet Take 1,000 mg by mouth every 6 (six) hours as needed for moderate pain. Patient not taking: Reported on 10/13/2022    [provider]  atomoxetine (STRATTERA) 40 MG capsule Take 40 mg by mouth daily. 05/03/22   [provider]  b complex vitamins capsule Take 1 capsule by mouth daily.    [provider]  Calcium Carb-Cholecalciferol (CALCIUM 500 + D PO) Take 2 tablets by mouth 2 (two) times daily.    [provider]  Cetirizine HCl 10 MG TBDP Take 10 mg by mouth daily.    [provider]  chlorhexidine (PERIDEX) 0.12 % solution Use as directed 15 mLs in the mouth or throat 2 (two) times daily. Patient not taking: Reported on 07/08/2022 06/06/22   Particia Nearing, PA-C  DUPIXENT 300 MG/2ML SOPN Inject into the skin. 09/15/22   [provider]  EUCRISA 2 % OINT Apply 1 application. topically daily as needed. Patient not taking: Reported on 07/08/2022 02/17/22   Nehemiah Settle, FNP  Ferrous Sulfate Dried (SLOW RELEASE IRON) 45 MG TBCR Take 45 mg by mouth daily. Patient not taking: Reported on 10/13/2022    [provider]  FLOVENT HFA 110 MCG/ACT inhaler Use 2 puffs 2 times daily for 1-2 weeks at a time until symptoms return to normal. Patient not taking: Reported on 07/08/2022 02/17/22   Nehemiah Settle, FNP  fluticasone (FLONASE) 50 MCG/ACT nasal spray 1-2 sprays each nostril daily as needed for stuffy nose. 02/17/22  Nehemiah Settle, FNP  hydrocortisone 2.5 % ointment 1 application 2 times daily as needed to red itchy areas. 02/17/22   Nehemiah Settle, FNP  ibuprofen (ADVIL) 600 MG tablet Take 1 tablet (600 mg total) by mouth every 6 (six) hours as needed. Patient not taking: Reported on 10/13/2022 07/14/22   Myna Hidalgo, DO  lidocaine (XYLOCAINE) 2 % solution Use as directed 10 mLs in the mouth or throat every 3 (three) hours as needed for mouth pain. Patient not taking: Reported on 07/08/2022 06/06/22   Particia Nearing, PA-C  metFORMIN (GLUCOPHAGE) 500 MG tablet Take 500 mg by mouth 2 (two) times daily with a meal. Patient not taking: Reported on 10/13/2022 02/02/21   [provider]  Multiple Vitamins-Minerals (BARIATRIC MULTIVITAMINS/IRON PO) Take 1 tablet by mouth daily.    [provider]  triamcinolone ointment (KENALOG) 0.5 % Apply 1 Application topically 2 (two) times daily. 10/13/22   Alfonse Spruce, MD  VENTOLIN HFA 108 [phone] Base) MCG/ACT inhaler Inhale 2 puffs into the lungs every 6 (six) hours as needed for wheezing or shortness of breath. 02/17/22   Nehemiah Settle, FNP    Family History Family History  Problem Relation Age of Onset   Healthy Mother    Atrial fibrillation Father     Migraines Sister    Hypertension Brother    Heart disease Maternal Grandmother    Cancer Maternal Grandfather        prostate   Stroke Paternal Grandmother    Cancer Paternal Grandfather        prostate   Stroke Paternal Grandfather    Colon cancer Neg Hx    Allergic rhinitis Neg Hx    Angioedema Neg Hx    Asthma Neg Hx    Atopy Neg Hx    Eczema Neg Hx    Urticaria Neg Hx    Immunodeficiency Neg Hx     Social History Social History   Tobacco Use   Smoking status: Never   Smokeless tobacco: Never  Vaping Use   Vaping Use: Never used  Substance Use Topics   Alcohol use: Yes    Comment: occassional   Drug use: No     Allergies   Peanut-containing drug products, Propranolol, Cephalexin, Imitrex [sumatriptan], and Nickel   Review of Systems Review of Systems Per HPI  Physical Exam Triage Vital Signs ED Triage Vitals  Enc Vitals Group     BP 11/03/22 1708 126/82     Pulse Rate 11/03/22 1708 68     Resp 11/03/22 1708 16     Temp 11/03/22 1708 98.4 F (36.9 C)     Temp Source 11/03/22 1708 Oral     SpO2 11/03/22 1708 99 %     Weight --      Height --      Head Circumference --      Peak Flow --      Pain Score 11/03/22 1706 8     Pain Loc --      Pain Edu? --      Excl. in GC? --    No data found.  Updated Vital Signs BP 126/82 (BP Location: Right Arm)   Pulse 68   Temp 98.4 F (36.9 C) (Oral)   Resp 16   LMP 10/30/2022 (Exact Date)   SpO2 99%   Visual Acuity Right Eye Distance:   Left Eye Distance:   Bilateral Distance:    Right Eye Near:   Left Eye Near:  Bilateral Near:     Physical Exam Vitals and nursing note reviewed.  Constitutional:      General: She is not in acute distress.    Appearance: She is well-developed.  HENT:     Head: Normocephalic.     Mouth/Throat:      Comments: Tooth #1 with fracture and caries. No obvious swelling noted to right face. No erythema, induration or fluctuance noted.  Eyes:     Extraocular  Movements: Extraocular movements intact.     Pupils: Pupils are equal, round, and reactive to light.  Cardiovascular:     Rate and Rhythm: Normal rate and regular rhythm.     Heart sounds: Normal heart sounds.  Pulmonary:     Effort: Pulmonary effort is normal. No respiratory distress.     Breath sounds: Normal breath sounds. No stridor. No wheezing, rhonchi or rales.  Abdominal:     General: Bowel sounds are normal.     Palpations: Abdomen is soft.  Musculoskeletal:     Cervical back: Normal range of motion.  Lymphadenopathy:     Cervical: Cervical adenopathy present.     Right cervical: Superficial cervical adenopathy present.  Skin:    General: Skin is warm and dry.  Neurological:     Mental Status: She is alert.  Psychiatric:        Mood and Affect: Mood normal.        Speech: Speech normal.        Behavior: Behavior normal.      UC Treatments / Results  Labs (all labs ordered are listed, but only abnormal results are displayed) Labs Reviewed - No data to display  EKG   Radiology No results found.  Procedures Procedures (including critical care time)  Medications Ordered in UC Medications - No data to display  Initial Impression / Assessment and Plan / UC Course  I have reviewed the triage vital signs and the nursing notes.  Pertinent labs & imaging results that were available during my care of the patient were reviewed by me and considered in my medical decision making (see chart for details).  The patient is well-appearing, she is in no acute distress, vital signs are stable.  Patient with continued possible dental infection.  Will change antibiotic to clindamycin 300 mg.  Patient was advised to take over-the-counter Tylenol arthritis strength for pain.  Supportive care recommendations were provided to the patient along with indications to follow-up with her dentist as scheduled.  Patient verbalizes understanding.  All questions were answered.  Patient is  stable for discharge.  Final Clinical Impressions(s) / UC Diagnoses   Final diagnoses:  Dentalgia     Discharge Instructions         Stop the amoxicillin you are currently taking. Take medication as prescribed. May take over-the-counter Tylenol arthritis strength pain or discomfort.  Warm salt water gargles 3-4 times daily until symptoms improve. Warm compresses to the affected area to help with pain and discomfort. May apply ice or heat as needed.  Apply ice for pain or swelling, heat for spasm or stiffness.  Apply for 20 minutes, remove for 1 hour, then repeat as needed. Follow-up with your dentist as scheduled for 11/08/2022.     ED Prescriptions     Medication Sig Dispense Auth. Provider   clindamycin (CLEOCIN) 300 MG capsule Take 1 capsule (300 mg total) by mouth 3 (three) times daily for 7 days. 21 capsule Talana Slatten-Warren, Sadie Haberhristie J, NP      PDMP  not reviewed this encounter.   Abran Cantor, NP 11/03/22 1728

## 2022-11-03 NOTE — Discharge Instructions (Addendum)
  Stop the amoxicillin you are currently taking. Take medication as prescribed. May take over-the-counter Tylenol arthritis strength pain or discomfort.  Warm salt water gargles 3-4 times daily until symptoms improve. Warm compresses to the affected area to help with pain and discomfort. May apply ice or heat as needed.  Apply ice for pain or swelling, heat for spasm or stiffness.  Apply for 20 minutes, remove for 1 hour, then repeat as needed. Follow-up with your dentist as scheduled for 11/08/2022.

## 2022-11-03 NOTE — ED Triage Notes (Signed)
Pt reports dental infection x 1 week; headache and fatigue x 5 days. Pt taking ibuprofen and amoxicillin, lats dose will be on 11/05/22.

## 2022-11-06 ENCOUNTER — Encounter (HOSPITAL_COMMUNITY): Payer: Self-pay

## 2022-11-06 ENCOUNTER — Emergency Department (HOSPITAL_COMMUNITY)
Admission: EM | Admit: 2022-11-06 | Discharge: 2022-11-06 | Disposition: A | Discharge status: Home / Self Care | Payer: No Typology Code available for payment source | Attending: Emergency Medicine | Admitting: Emergency Medicine

## 2022-11-06 ENCOUNTER — Other Ambulatory Visit: Payer: Self-pay

## 2022-11-06 DIAGNOSIS — Z91010 Allergy to peanuts: Secondary | ICD-10-CM | POA: Insufficient documentation

## 2022-11-06 DIAGNOSIS — K0889 Other specified disorders of teeth and supporting structures: Secondary | ICD-10-CM | POA: Diagnosis not present

## 2022-11-06 DIAGNOSIS — R220 Localized swelling, mass and lump, head: Secondary | ICD-10-CM | POA: Diagnosis present

## 2022-11-06 HISTORY — DX: Nausea with vomiting, unspecified: R11.2

## 2022-11-06 MED ORDER — METRONIDAZOLE 500 MG PO TABS: 500.0000 mg | ORAL_TABLET | Freq: Three times a day (TID) | ORAL | 0 refills | Status: AC

## 2022-11-06 NOTE — Discharge Instructions (Addendum)
See your dentist as scheduled.

## 2022-11-06 NOTE — ED Triage Notes (Signed)
Pt reports having a dental infection since 12/5. Pt has taken amoxicillin and then was switched to clindamycin on 12/13. Pt states it feels like symptoms are getting worse with increased swelling and pain.

## 2022-11-06 NOTE — ED Provider Notes (Signed)
[facility] EMERGENCY DEPARTMENT Provider Note   CSN: [id number] Arrival date & time: 11/06/22  1600     History  Chief Complaint  Patient presents with   Dental Pain    Patricia [name] is a 31 y.o. female.  Pt reports pain in right upper gum along tooth.  Pt is on clindamycin for three days but feels like she is getting worse.  Pt is scheduled to see Dentist on Monday for evaluation.  Pt was on amoxicillian prior to clindamycin.   The history is provided by the patient. No language interpreter was used.  Dental Pain Location:  Upper Upper teeth location:  2/RU 2nd molar Quality:  Constant Severity:  Moderate Onset quality:  Gradual Duration:  1 week Timing:  Constant Progression:  Worsening Chronicity:  New Relieved by:  Nothing Worsened by:  Nothing Ineffective treatments:  None tried Associated symptoms: facial swelling and gum swelling        Home Medications Prior to Admission medications   Medication Sig Start Date End Date Taking? Authorizing Provider  acetaminophen (TYLENOL) 500 MG tablet Take 1,000 mg by mouth every 6 (six) hours as needed for moderate pain. Patient not taking: Reported on 10/13/2022    [provider]  atomoxetine (STRATTERA) 40 MG capsule Take 40 mg by mouth daily. 05/03/22   [provider]  b complex vitamins capsule Take 1 capsule by mouth daily.    [provider]  Calcium Carb-Cholecalciferol (CALCIUM 500 + D PO) Take 2 tablets by mouth 2 (two) times daily.    [provider]  Cetirizine HCl 10 MG TBDP Take 10 mg by mouth daily.    [provider]  chlorhexidine (PERIDEX) 0.12 % solution Use as directed 15 mLs in the mouth or throat 2 (two) times daily. Patient not taking: Reported on 07/08/2022 06/06/22   Particia Nearing, PA-C  clindamycin (CLEOCIN) 300 MG capsule Take 1 capsule (300 mg total) by mouth 3 (three) times daily for 7 days. 11/03/22 11/10/22  Leath-Warren, Sadie Haber, NP   DUPIXENT 300 MG/2ML SOPN Inject into the skin. 09/15/22   [provider]  EUCRISA 2 % OINT Apply 1 application. topically daily as needed. Patient not taking: Reported on 07/08/2022 02/17/22   Nehemiah Settle, FNP  Ferrous Sulfate Dried (SLOW RELEASE IRON) 45 MG TBCR Take 45 mg by mouth daily. Patient not taking: Reported on 10/13/2022    [provider]  FLOVENT HFA 110 MCG/ACT inhaler Use 2 puffs 2 times daily for 1-2 weeks at a time until symptoms return to normal. Patient not taking: Reported on 07/08/2022 02/17/22   Nehemiah Settle, FNP  fluticasone (FLONASE) 50 MCG/ACT nasal spray 1-2 sprays each nostril daily as needed for stuffy nose. 02/17/22   Nehemiah Settle, FNP  hydrocortisone 2.5 % ointment 1 application 2 times daily as needed to red itchy areas. 02/17/22   Nehemiah Settle, FNP  ibuprofen (ADVIL) 600 MG tablet Take 1 tablet (600 mg total) by mouth every 6 (six) hours as needed. Patient not taking: Reported on 10/13/2022 07/14/22   Myna Hidalgo, DO  lidocaine (XYLOCAINE) 2 % solution Use as directed 10 mLs in the mouth or throat every 3 (three) hours as needed for mouth pain. Patient not taking: Reported on 07/08/2022 06/06/22   Particia Nearing, PA-C  metFORMIN (GLUCOPHAGE) 500 MG tablet Take 500 mg by mouth 2 (two) times daily with a meal. Patient not taking: Reported on 10/13/2022 02/02/21   [provider]  Multiple Vitamins-Minerals (BARIATRIC MULTIVITAMINS/IRON PO) Take 1 tablet by mouth daily.    [provider]  triamcinolone ointment (KENALOG) 0.5 % Apply 1 Application topically 2 (two) times daily. 10/13/22   Valentina Shaggy, MD  VENTOLIN HFA 108 [phone] Base) MCG/ACT inhaler Inhale 2 puffs into the lungs every 6 (six) hours as needed for wheezing or shortness of breath. 02/17/22   Althea Charon, FNP      Allergies    Peanut-containing drug products, Propranolol, Cephalexin, Imitrex [sumatriptan], and Nickel    Review of Systems    Review of Systems  HENT:  Positive for facial swelling.   All other systems reviewed and are negative.   Physical Exam Updated Vital Signs BP (!) 143/83 (BP Location: Right Arm)   Pulse (!) 56   Temp 98.2 F (36.8 C) (Oral)   Resp 16   Ht 5\' 4"  (1.626 m)   Wt 97.1 kg   LMP 10/30/2022 (Exact Date)   SpO2 100%   BMI 36.73 kg/m  Physical Exam Vitals reviewed.  Constitutional:      Appearance: Normal appearance.  HENT:     Nose: Nose normal.     Mouth/Throat:     Mouth: Mucous membranes are moist.     Comments: No obvious plate swelling,  slight swelling right face.  Cardiovascular:     Rate and Rhythm: Normal rate.  Pulmonary:     Effort: Pulmonary effort is normal.  Skin:    General: Skin is warm.  Neurological:     General: No focal deficit present.     Mental Status: She is alert.  Psychiatric:        Mood and Affect: Mood normal.     ED Results / Procedures / Treatments   Labs (all labs ordered are listed, but only abnormal results are displayed) Labs Reviewed - No data to display  EKG None  Radiology No results found.  Procedures Procedures    Medications Ordered in ED Medications - No data to display  ED Course/ Medical Decision Making/ A&P                           Medical Decision Making Pt complains of dental pain.  Pt is scheduled to see her dentist on Monday   Amount and/or Complexity of Data Reviewed External Data Reviewed: notes.    Details: Urgent care notes are review   Risk Risk Details: I discussed with Dr. Gilford Raid who advised adding flagyl  Pt advised to keep follow up with her dentist            Final Clinical Impression(s) / ED Diagnoses Final diagnoses:  Pain, dental    Rx / DC Orders ED Discharge Orders          Ordered    metroNIDAZOLE (FLAGYL) 500 MG tablet  3 times daily        11/06/22 1733              Sidney Ace 11/06/22 1734    Isla Pence, MD 11/07/22 1539

## 2022-11-06 NOTE — ED Notes (Signed)
Pt provided discharge instructions and prescription information. Pt was given the opportunity to ask questions and questions were answered.   

## 2022-11-17 ENCOUNTER — Other Ambulatory Visit: Payer: Self-pay

## 2022-11-17 ENCOUNTER — Encounter (HOSPITAL_COMMUNITY): Payer: Self-pay

## 2022-11-17 DIAGNOSIS — R102 Pelvic and perineal pain: Secondary | ICD-10-CM | POA: Diagnosis present

## 2022-11-17 DIAGNOSIS — N739 Female pelvic inflammatory disease, unspecified: Secondary | ICD-10-CM | POA: Diagnosis not present

## 2022-11-17 DIAGNOSIS — Z91010 Allergy to peanuts: Secondary | ICD-10-CM | POA: Insufficient documentation

## 2022-11-17 NOTE — ED Triage Notes (Signed)
[facility] from home. Cc of pelvic pain for a couple hours. Denies bleeding or discharge. Denies any urinary s/s.

## 2022-11-18 ENCOUNTER — Emergency Department (HOSPITAL_COMMUNITY): Payer: No Typology Code available for payment source

## 2022-11-18 ENCOUNTER — Emergency Department (HOSPITAL_COMMUNITY)
Admission: EM | Admit: 2022-11-18 | Discharge: 2022-11-18 | Disposition: A | Discharge status: Home / Self Care | Payer: No Typology Code available for payment source | Attending: Emergency Medicine | Admitting: Emergency Medicine

## 2022-11-18 DIAGNOSIS — N739 Female pelvic inflammatory disease, unspecified: Secondary | ICD-10-CM

## 2022-11-18 LAB — URINALYSIS, ROUTINE W REFLEX MICROSCOPIC
Bilirubin Urine: NEGATIVE
Glucose, UA: NEGATIVE mg/dL
Hgb urine dipstick: NEGATIVE
Ketones, ur: 5 mg/dL — AB
Leukocytes,Ua: NEGATIVE
Nitrite: NEGATIVE
Protein, ur: 30 mg/dL — AB
Specific Gravity, Urine: 1.033 — ABNORMAL HIGH (ref 1.005–1.030)
pH: 5 (ref 5.0–8.0)

## 2022-11-18 LAB — BASIC METABOLIC PANEL
Anion gap: 5 (ref 5–15)
BUN: 11 mg/dL (ref 6–20)
CO2: 28 mmol/L (ref 22–32)
Calcium: 8.6 mg/dL — ABNORMAL LOW (ref 8.9–10.3)
Chloride: 105 mmol/L (ref 98–111)
Creatinine, Ser: 0.65 mg/dL (ref 0.44–1.00)
GFR, Estimated: >60 mL/min (ref >60)
Glucose, Bld: 92 mg/dL (ref 70–99)
Potassium: 3.9 mmol/L (ref 3.5–5.1)
Sodium: 138 mmol/L (ref 135–145)

## 2022-11-18 LAB — CBC WITH DIFFERENTIAL/PLATELET
Abs Immature Granulocytes: 0.01 10*3/uL (ref 0.00–0.07)
Basophils Absolute: 0 10*3/uL (ref 0.0–0.1)
Basophils Relative: 0 %
Eosinophils Absolute: 0.1 10*3/uL (ref 0.0–0.5)
Eosinophils Relative: 2 %
HCT: 35.7 % — ABNORMAL LOW (ref 36.0–46.0)
Hemoglobin: 11.5 g/dL — ABNORMAL LOW (ref 12.0–15.0)
Immature Granulocytes: 0 %
Lymphocytes Relative: 48 %
Lymphs Abs: 3.2 10*3/uL (ref 0.7–4.0)
MCH: 26.5 pg (ref 26.0–34.0)
MCHC: 32.2 g/dL (ref 30.0–36.0)
MCV: 82.3 fL (ref 80.0–100.0)
Monocytes Absolute: 0.5 10*3/uL (ref 0.1–1.0)
Monocytes Relative: 8 %
Neutro Abs: 2.8 10*3/uL (ref 1.7–7.7)
Neutrophils Relative %: 42 %
Platelets: 244 10*3/uL (ref 150–400)
RBC: 4.34 MIL/uL (ref 3.87–5.11)
RDW: 14.1 % (ref 11.5–15.5)
WBC: 6.7 10*3/uL (ref 4.0–10.5)
nRBC: 0 % (ref 0.0–0.2)

## 2022-11-18 LAB — WET PREP, GENITAL
Sperm: NONE SEEN
Trich, Wet Prep: NONE SEEN
Yeast Wet Prep HPF POC: NONE SEEN

## 2022-11-18 LAB — POC URINE PREG, ED: Preg Test, Ur: NEGATIVE

## 2022-11-18 MED ORDER — METRONIDAZOLE 500 MG PO TABS
500.0000 mg | ORAL_TABLET | Freq: Once | ORAL | Status: AC
  Administered 2022-11-18: 500 mg via ORAL
  Filled 2022-11-18: qty 1

## 2022-11-18 MED ORDER — LEVOFLOXACIN 500 MG PO TABS
500.0000 mg | ORAL_TABLET | Freq: Once | ORAL | Status: AC
  Administered 2022-11-18: 500 mg via ORAL
  Filled 2022-11-18: qty 1

## 2022-11-18 MED ORDER — LEVOFLOXACIN 500 MG PO TABS: 500.0000 mg | ORAL_TABLET | Freq: Every day | ORAL | 0 refills | Status: DC

## 2022-11-18 MED ORDER — METRONIDAZOLE 500 MG PO TABS: 500.0000 mg | ORAL_TABLET | Freq: Two times a day (BID) | ORAL | 0 refills | Status: DC

## 2022-11-18 MED ORDER — IOHEXOL 300 MG/ML  SOLN
100.0000 mL | Freq: Once | INTRAMUSCULAR | Status: AC | PRN
  Administered 2022-11-18: 100 mL via INTRAVENOUS

## 2022-11-18 NOTE — Discharge Instructions (Signed)
You were given your first dose of Levaquin and Flagyl here.  The Levaquin you will start tomorrow and the Flagyl you will need to take a second dose tonight.  Follow-up with [facility] or your primary care doctor for a recheck.  However if you develop fever, new or worsening abdominal or pelvic pain, vomiting, or any other new/concerning symptoms then return to the ER for evaluation.

## 2022-11-18 NOTE — ED Provider Notes (Signed)
Care transferred to me. Likely PID waiting on CT scan.  Patient has a dominant follicle on the left cyst and I personally viewed/interpret these images.  However based on presentation it seems highly unlikely this is a TOA.  She is well-appearing.  Per prior plan, will treat with Levaquin and metronidazole given her cephalosporin allergy.  She will otherwise need to follow-up with her OB/GYN.  She prefers to take Tylenol for pain.  Given first dose of antibiotics here.   Pricilla Loveless, MD 11/18/22 0900

## 2022-11-18 NOTE — ED Provider Notes (Signed)
[facility] EMERGENCY DEPARTMENT Provider Note   CSN: [id number] Arrival date & time: 11/17/22  2120     History  Chief Complaint  Patient presents with   Pelvic Pain    Patricia [name] is a 31 y.o. female.  Patient presents to the emergency department for evaluation of pelvic pain.  Pain is in the center of her low pelvis region.  Patient reports that pain began a couple of hours before coming to the ER.  She does report some pain with straining to urinate but no urinary frequency.  She is not currently menstruating.  She has not noticed any vaginal bleeding.       Home Medications Prior to Admission medications   Medication Sig Start Date End Date Taking? Authorizing Provider  b complex vitamins capsule Take 1 capsule by mouth daily.   Yes [provider]  Calcium Carb-Cholecalciferol (CALCIUM 500 + D PO) Take 2 tablets by mouth 2 (two) times daily.   Yes [provider]  Cetirizine HCl 10 MG TBDP Take 10 mg by mouth daily.   Yes [provider]  DUPIXENT 300 MG/2ML SOPN Inject 2 mLs into the skin every 30 (thirty) days. 09/15/22  Yes [provider]  levofloxacin (LEVAQUIN) 500 MG tablet Take 1 tablet (500 mg total) by mouth daily. 11/18/22  Yes Pricilla Loveless, MD  metroNIDAZOLE (FLAGYL) 500 MG tablet Take 1 tablet (500 mg total) by mouth 2 (two) times daily. 11/18/22  Yes Pricilla Loveless, MD  Multiple Vitamins-Minerals (BARIATRIC MULTIVITAMINS/IRON PO) Take 1 tablet by mouth daily.   Yes [provider]  VENTOLIN HFA 108 (90 Base) MCG/ACT inhaler Inhale 2 puffs into the lungs every 6 (six) hours as needed for wheezing or shortness of breath. 02/17/22  Yes Nehemiah Settle, FNP  chlorhexidine (PERIDEX) 0.12 % solution Use as directed 15 mLs in the mouth or throat 2 (two) times daily. Patient not taking: Reported on 07/08/2022 06/06/22   Particia Nearing, PA-C  EUCRISA 2 % OINT Apply 1 application. topically daily as  needed. Patient not taking: Reported on 07/08/2022 02/17/22   Nehemiah Settle, FNP  FLOVENT HFA 110 MCG/ACT inhaler Use 2 puffs 2 times daily for 1-2 weeks at a time until symptoms return to normal. Patient not taking: Reported on 07/08/2022 02/17/22   Nehemiah Settle, FNP  fluticasone [facility]) 50 MCG/ACT nasal spray 1-2 sprays each nostril daily as needed for stuffy nose. Patient not taking: Reported on 11/18/2022 02/17/22   Nehemiah Settle, FNP  hydrocortisone 2.5 % ointment 1 application 2 times daily as needed to red itchy areas. Patient not taking: Reported on 11/18/2022 02/17/22   Nehemiah Settle, FNP  ibuprofen (ADVIL) 600 MG tablet Take 1 tablet (600 mg total) by mouth every 6 (six) hours as needed. Patient not taking: Reported on 10/13/2022 07/14/22   Myna Hidalgo, DO  lidocaine (XYLOCAINE) 2 % solution Use as directed 10 mLs in the mouth or throat every 3 (three) hours as needed for mouth pain. Patient not taking: Reported on 07/08/2022 06/06/22   Particia Nearing, PA-C  triamcinolone ointment (KENALOG) 0.5 % Apply 1 Application topically 2 (two) times daily. Patient not taking: Reported on 11/18/2022 10/13/22   Alfonse Spruce, MD      Allergies    Peanut-containing drug products, Propranolol, Cephalexin, Imitrex [sumatriptan], and Nickel    Review of Systems   Review of Systems  Physical Exam Updated Vital Signs BP (!) 106/49   Pulse (!) 58   Temp  98.2 F (36.8 C) (Oral)   Resp 18   Ht 5\' 4"  (1.626 m)   Wt 98 kg   LMP 10/30/2022 (Exact Date)   SpO2 99%   BMI 37.09 kg/m  Physical Exam Vitals and nursing note reviewed. Exam conducted with a chaperone present.  Constitutional:      General: She is not in acute distress.    Appearance: She is well-developed.  HENT:     Head: Normocephalic and atraumatic.     Mouth/Throat:     Mouth: Mucous membranes are moist.  Eyes:     General: Vision grossly intact. Gaze aligned appropriately.     Extraocular Movements:  Extraocular movements intact.     Conjunctiva/sclera: Conjunctivae normal.  Cardiovascular:     Rate and Rhythm: Normal rate and regular rhythm.     Pulses: Normal pulses.     Heart sounds: Normal heart sounds, S1 normal and S2 normal. No murmur heard.    No friction rub. No gallop.  Pulmonary:     Effort: Pulmonary effort is normal. No respiratory distress.     Breath sounds: Normal breath sounds.  Abdominal:     General: Bowel sounds are normal.     Palpations: Abdomen is soft.     Tenderness: There is abdominal tenderness in the suprapubic area. There is no guarding or rebound.     Hernia: No hernia is present.  Genitourinary:    Cervix: Cervical motion tenderness and discharge (Thick, yellow) present.     Adnexa: Right adnexa normal and left adnexa normal.  Musculoskeletal:        General: No swelling.     Cervical back: Full passive range of motion without pain, normal range of motion and neck supple. No spinous process tenderness or muscular tenderness. Normal range of motion.     Right lower leg: No edema.     Left lower leg: No edema.  Skin:    General: Skin is warm and dry.     Capillary Refill: Capillary refill takes less than 2 seconds.     Findings: No ecchymosis, erythema, rash or wound.  Neurological:     General: No focal deficit present.     Mental Status: She is alert and oriented to person, place, and time.     GCS: GCS eye subscore is 4. GCS verbal subscore is 5. GCS motor subscore is 6.     Cranial Nerves: Cranial nerves 2-12 are intact.     Sensory: Sensation is intact.     Motor: Motor function is intact.     Coordination: Coordination is intact.  Psychiatric:        Attention and Perception: Attention normal.        Mood and Affect: Mood normal.        Speech: Speech normal.        Behavior: Behavior normal.     ED Results / Procedures / Treatments   Labs (all labs ordered are listed, but only abnormal results are displayed) Labs Reviewed  WET  PREP, GENITAL - Abnormal; Notable for the following components:      Result Value   Clue Cells Wet Prep HPF POC PRESENT (*)    WBC, Wet Prep HPF POC MANY (*)    All other components within normal limits  BASIC METABOLIC PANEL - Abnormal; Notable for the following components:   Calcium 8.6 (*)    All other components within normal limits  URINALYSIS, ROUTINE W REFLEX MICROSCOPIC - Abnormal; Notable  for the following components:   Color, Urine AMBER (*)    APPearance HAZY (*)    Specific Gravity, Urine 1.033 (*)    Ketones, ur 5 (*)    Protein, ur 30 (*)    Bacteria, UA RARE (*)    All other components within normal limits  CBC WITH DIFFERENTIAL/PLATELET - Abnormal; Notable for the following components:   Hemoglobin 11.5 (*)    HCT 35.7 (*)    All other components within normal limits  POC URINE PREG, ED - Normal  CBC WITH DIFFERENTIAL/PLATELET  GC/CHLAMYDIA PROBE AMP ([place]) NOT AT [facility]    EKG None  Radiology No results found.  Procedures Procedures    Medications Ordered in ED Medications  iohexol (OMNIPAQUE) 300 MG/ML solution 100 mL (100 mLs Intravenous Contrast Given 11/18/22 0738)  levofloxacin (LEVAQUIN) tablet 500 mg (500 mg Oral Given 11/18/22 0908)  metroNIDAZOLE (FLAGYL) tablet 500 mg (500 mg Oral Given 11/18/22 0908)    ED Course/ Medical Decision Making/ A&P                           Medical Decision Making Amount and/or Complexity of Data Reviewed Labs: ordered. Radiology: ordered.  Risk Prescription drug management.   Patient presents to the emergency department for evaluation of pelvic pain.  Patient appears to be in some discomfort at arrival.  Pain is in the central low pelvic region, not right lower quadrant or left lower quadrant.  Patient underwent a pelvic exam which was suspicious for pelvic infection.  She had yellow cervical discharge with fairly significant cervical motion tenderness.  CT pending (ultrasound not available).  Will  sign out to oncoming ER physician.  If CT rules out pathology including tubo-ovarian abscess, appendicitis, anticipate outpatient management of PID with OB/GYN follow-up        Final Clinical Impression(s) / ED Diagnoses Final diagnoses:  Pelvic inflammatory disease (PID)    Rx / DC Orders ED Discharge Orders          Ordered    levofloxacin (LEVAQUIN) 500 MG tablet  Daily        11/18/22 0858    metroNIDAZOLE (FLAGYL) 500 MG tablet  2 times daily        11/18/22 0858              Gilda Crease, MD 11/22/22 [phone]

## 2022-11-19 LAB — GC/CHLAMYDIA PROBE AMP (~~LOC~~) NOT AT ARMC
Chlamydia: NEGATIVE
Comment: NEGATIVE
Comment: NORMAL
Neisseria Gonorrhea: NEGATIVE

## 2022-12-01 ENCOUNTER — Encounter: Payer: Self-pay | Admitting: Obstetrics & Gynecology

## 2022-12-01 ENCOUNTER — Ambulatory Visit: Payer: No Typology Code available for payment source | Admitting: Obstetrics & Gynecology

## 2022-12-01 VITALS — BP 107/68 | HR 73 | Ht 63.0 in | Wt 219.0 lb

## 2022-12-01 DIAGNOSIS — N946 Dysmenorrhea, unspecified: Secondary | ICD-10-CM | POA: Diagnosis not present

## 2022-12-01 DIAGNOSIS — Z9889 Other specified postprocedural states: Secondary | ICD-10-CM

## 2022-12-01 NOTE — Progress Notes (Signed)
   GYN VISIT Patient name: Patricia [name] MRN [id number]  Date of birth: [birth year] Chief Complaint:   Follow-up (ER pelvic pain, CT Scan on 12-223)  History of Present Illness:   Patricia [name] is a 32 y.o. [phone]  female being seen today for ER follow up.  Pelvic pain: Pt seen 12/28 diagnosed with PID and treated as outpatient; however PID unlikely as she had not been sexually active since 2017. She presented with syncopal episode secondary to pain, normal Abd CT.  Pt noted to have BV.  Today she notes that the pain is mostly improved, just mild cramping and just started her period on Monday.  She does report that over the past few months her cramping has gotten significantly worse.  Denies irregular discharge or itching.  No other acute complaints.  Family planning: Desires future pregnancy, husband currently incarcerated  Cervical dysplasia s/p LEEP 06/2022, []  due for Pap in Feb.     Patient's last menstrual period was 11/29/2022 (exact date).     05/31/2022    8:37 AM 05/26/2021    8:48 AM 12/31/2020    8:42 AM 10/15/2020    9:47 AM 07/15/2020    8:35 AM  Depression screen PHQ 2/9  Decreased Interest 0 0 0 1 0  Down, Depressed, Hopeless 0 0 0 0 0  PHQ - 2 Score 0 0 0 1 0  Altered sleeping 0 0 2 1 0  Tired, decreased energy 0 0 1 0 0  Change in appetite 0 1 3 0 0  Feeling bad or failure about yourself  0 0 0 0 0  Trouble concentrating 1 1 2 1 3   Moving slowly or fidgety/restless 0 0 0 0 0  Suicidal thoughts 0 0 0 0 0  PHQ-9 Score 1 2 8 3 3   Difficult doing work/chores   Somewhat difficult  Somewhat difficult     Review of Systems:   Pertinent items are noted in HPI Denies fever/chills, dizziness, headaches, visual disturbances, fatigue, shortness of breath, chest pain, abdominal pain, vomiting, no problems with heavy periods.  Denies problems with bowel movements, urination unless otherwise stated above.  Pertinent History Reviewed:  Reviewed past medical,surgical, social,  obstetrical and family history.  Reviewed problem list, medications and allergies. Physical Assessment:   Vitals:   12/01/22 1114  BP: 107/68  Pulse: 73  Weight: 219 lb (99.3 kg)  Height: 5\' 3"  (1.6 m)  Body mass index is 38.79 kg/m.       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   Janyth Pupa, DO Attending [place], [place] for [place]

## 2023-01-05 ENCOUNTER — Other Ambulatory Visit (HOSPITAL_COMMUNITY)
Admission: RE | Admit: 2023-01-05 | Discharge: 2023-01-05 | Disposition: A | Discharge status: Home / Self Care | Payer: No Typology Code available for payment source | Source: Ambulatory Visit | Attending: Obstetrics & Gynecology | Admitting: Obstetrics & Gynecology

## 2023-01-05 ENCOUNTER — Ambulatory Visit (INDEPENDENT_AMBULATORY_CARE_PROVIDER_SITE_OTHER): Payer: No Typology Code available for payment source | Admitting: Obstetrics & Gynecology

## 2023-01-05 ENCOUNTER — Encounter: Payer: Self-pay | Admitting: Obstetrics & Gynecology

## 2023-01-05 VITALS — BP 131/80 | HR 59 | Ht 64.0 in | Wt 217.0 lb

## 2023-01-05 DIAGNOSIS — Z9889 Other specified postprocedural states: Secondary | ICD-10-CM | POA: Insufficient documentation

## 2023-01-05 DIAGNOSIS — N879 Dysplasia of cervix uteri, unspecified: Secondary | ICD-10-CM

## 2023-01-05 DIAGNOSIS — N946 Dysmenorrhea, unspecified: Secondary | ICD-10-CM | POA: Diagnosis not present

## 2023-01-05 DIAGNOSIS — N3281 Overactive bladder: Secondary | ICD-10-CM | POA: Diagnosis not present

## 2023-01-05 DIAGNOSIS — Z3009 Encounter for other general counseling and advice on contraception: Secondary | ICD-10-CM

## 2023-01-05 MED ORDER — KETOROLAC TROMETHAMINE 10 MG PO TABS: 10.0000 mg | ORAL_TABLET | Freq: Four times a day (QID) | ORAL | 3 refills | Status: DC | PRN

## 2023-01-05 NOTE — Addendum Note (Signed)
Addended by: Octaviano Glow on: 01/05/2023 02:02 PM   Modules accepted: Orders

## 2023-01-05 NOTE — Progress Notes (Signed)
GYN VISIT Patient name: Patricia [name] MRN [id number]  Date of birth: 10-Jan-1991 Chief Complaint:   Repeat pap-hx LEEP  History of Present Illness:   Patricia [name] is a 32 y.o. 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  She needs to go immediately or else she notes an accident.  Wears a pad daily.  No caffeine use.  Denies nocturia- typically up once at night to void.  -Dysmenorrhea- notes considerable pain with minimal improvement with ibuprofen.  In the past, pain has caused her to pass out.  This past month was a little better, taking herbal supplements as well.  Patient's last menstrual period was 12/28/2022.     05/31/2022    8:37 AM 05/26/2021    8:48 AM 12/31/2020    8:42 AM 10/15/2020    9:47 AM 07/15/2020    8:35 AM  Depression screen PHQ 2/9  Decreased Interest 0 0 0 1 0  Down, Depressed, Hopeless 0 0 0 0 0  PHQ - 2 Score 0 0 0 1 0  Altered sleeping 0 0 2 1 0  Tired, decreased energy 0 0 1 0 0  Change in appetite 0 1 3 0 0  Feeling bad or failure about yourself  0 0 0 0 0  Trouble concentrating 1 1 2 1 3  $ Moving slowly or fidgety/restless 0 0 0 0 0  Suicidal thoughts 0 0 0 0 0  PHQ-9 Score 1 2 8 3 3  $ Difficult doing work/chores   Somewhat difficult  Somewhat difficult     Review of Systems:   Pertinent items are noted in HPI Denies fever/chills, dizziness, headaches, visual disturbances, fatigue, shortness of breath, chest pain, abdominal pain, vomiting, no problems bowel movements, urination, or intercourse unless otherwise stated above.  Pertinent History Reviewed:  Reviewed past medical,surgical, social, obstetrical and family history.  Reviewed problem list, medications and allergies. Physical Assessment:   Vitals:   01/05/23 1103  BP:  131/80  Pulse: (!) 59  Weight: 217 lb (98.4 kg)  Height: 5' 4"$  (1.626 m)  Body mass index is 37.25 kg/m.       Physical Examination:   General appearance: alert, well appearing, and in no distress  Psych: mood appropriate, normal affect  Skin: warm & dry   Cardiovascular: normal heart rate noted  Respiratory: normal respiratory effort, no distress  Abdomen: soft, non-tender, no rebound, no guarding  Pelvic: VULVA: normal appearing vulva with no masses, tenderness or lesions, VAGINA: normal appearing vagina with normal color and discharge, no lesions, CERVIX: normal appearing cervix without discharge or lesions, UTERUS: uterus is normal size, shape, consistency and nontender  Extremities: no edema, no calf tenderness bilaterally  Chaperone:  pt declined     Assessment & Plan:  1) Cervical dysplasia -repeat pap today, reviewed ASCCP guidelines  2) OAB -samples of Myrbetriq 93m daily []$  if working pt to call in and will send in Rx, may also increase to [id number]  3) Dysmenorrhea -due to desire for future pregnancy will hold off on OCPs or other medication to regulate menses -trial of Toradol instead of ibuprofen []$  may consider Flexeril if minimal improvement with Toradol  4) Family planning -encouraged pt to track menses -reviewed ovulation window  Questions and concerns were addressed. F/U in  6 mos for annual  Meds ordered this encounter  Medications   ketorolac (TORADOL) 10 MG tablet    Sig: Take 1 tablet (10 mg total) by mouth every 6 (six) hours as needed.    Dispense:  30 tablet    Refill:  3     Return in about 6 months (around 07/06/2023) for Annual (August 2024).   Janyth Pupa, DO Attending [place], [facility] for [facility], [place]  '

## 2023-01-10 LAB — CYTOLOGY - PAP
Diagnosis: NEGATIVE
Diagnosis: REACTIVE

## 2023-01-24 ENCOUNTER — Ambulatory Visit
Admission: EM | Admit: 2023-01-24 | Discharge: 2023-01-24 | Disposition: A | Discharge status: Home / Self Care | Payer: No Typology Code available for payment source

## 2023-01-24 DIAGNOSIS — L989 Disorder of the skin and subcutaneous tissue, unspecified: Secondary | ICD-10-CM

## 2023-01-24 DIAGNOSIS — S20161A Insect bite (nonvenomous) of breast, right breast, initial encounter: Secondary | ICD-10-CM

## 2023-01-24 DIAGNOSIS — W57XXXA Bitten or stung by nonvenomous insect and other nonvenomous arthropods, initial encounter: Secondary | ICD-10-CM | POA: Diagnosis not present

## 2023-01-24 NOTE — ED Triage Notes (Signed)
Patient states she noticed a tick on her right side under breast, pt mom removed it and she thinks the head is still stuck. Also is having a sore throat that started last night.

## 2023-01-24 NOTE — Discharge Instructions (Signed)
Apply Neosporin and a bandage to the area until fully healed to avoid infection

## 2023-01-24 NOTE — ED Provider Notes (Signed)
[facility]    CSN: [id number] Arrival date & time: 01/24/23  0825      History   Chief Complaint Chief Complaint  Patient presents with   Tick Removal    HPI Patricia [name] is a 32 y.o. female.   Patient presenting today with concern of a tick bite under her right breast that was first noticed this morning after her shower.  She states her mom tried to remove the tick but it came out in pieces and she is concerned that the entire tick was not fully removed.  She denies redness, bleeding, drainage from the area.  Started with a scratchy throat last night and is unsure if it is related.    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 05/28/2020   Pap +HPV other and [facility], will need colpo per ASCCP guidelines,immediate  risk for CIN 3+ 12.6%    Asthma    Cough 08/01/2015   Depression    Fatty liver    Flexural atopic dermatitis 05/28/2022   GERD (gastroesophageal reflux disease)    HSIL (high grade squamous intraepithelial lesion) on Pap smear of cervix 06/04/2022   06/04/22 colpo per ASCCP guidelines, immediate risk is 23 %   Migraine    Migraines    Nausea and vomiting during pregnancy prior to [redacted] weeks gestation 08/01/2015   Nausea vomiting and diarrhea    Obesity    PCO (polycystic ovaries) 03/08/2014   Polycystic disease, ovaries    Pregnancy induced hypertension    Pregnant 05/27/2015   URI (upper respiratory infection) 08/01/2015   URI (upper respiratory infection) 08/01/2015    Patient Active Problem List   Diagnosis Date Noted   HSIL (high grade squamous intraepithelial lesion) on Pap smear of cervix 06/04/2022   ASCUS with positive high risk HPV cervical 05/31/2022   Urinary incontinence 05/31/2022   Routine general medical examination at a health care facility 05/31/2022   Mixed stress and urge urinary incontinence 05/31/2022   Mild persistent asthma, uncomplicated  [id number]   Flexural atopic dermatitis 05/28/2022   Pap smear of cervix with ASCUS, cannot exclude HGSIL 05/26/2021   Hip pain, right 05/26/2021   Family planning 05/26/2021   RUQ pain 04/22/2021   Esophagitis 04/22/2021   Pelvic pain 11/28/2020   Hypertension 07/15/2020   Abnormal Papanicolaou smear of cervix with positive human papilloma virus (HPV) test 05/28/2020   Nexplanon removal 05/20/2020   Encounter for gynecological examination with Papanicolaou smear of cervix 05/20/2020   Depression 05/20/2020   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   Fatty liver 11/29/2019   Diarrhea 09/27/2019   Elevated LFTs 06/17/2017   Calculus of gallbladder with acute cholecystitis without obstruction    Nausea vomiting and diarrhea    RUQ abdominal pain    Transaminitis    Biliary colic    Gall stones, common bile duct    Abdominal pain 06/04/2017   Chronic hypertension complicating or reason for care during childbirth 04/13/2017   Chronic hypertension in pregnancy 04/13/2017   Chronic hypertension affecting pregnancy 11/08/2016   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   Supervision of high risk pregnancy, antepartum 09/27/2016   History of gestational hypertension 09/27/2016   History of shoulder dystocia in prior pregnancy, currently pregnant 09/27/2016   URI (upper respiratory infection) 08/01/2015   Persistent cough 08/01/2015   Obesity, Class III, BMI 40-49.9 (morbid obesity) ([place]) 09/23/2014  Headache 09/23/2014   PCO (polycystic ovaries) 03/08/2014    Past Surgical History:  Procedure Laterality Date   BIOPSY  01/20/2021   Procedure: BIOPSY;  Surgeon: Eloise Harman, DO;  Location[facility];  Service: Endoscopy;;   CHOLECYSTECTOMY N/A 06/08/2017   Procedure: LAPAROSCOPIC CHOLECYSTECTOMY;  Surgeon: Aviva Signs, MD;  Location: [facility];  Service: General;  Laterality: N/A;   ESOPHAGOGASTRODUODENOSCOPY (EGD) WITH PROPOFOL N/A 01/20/2021    LA Grade A reflux esophagitis, gastritis s/p biopsy, normal duodenum s/p biopsy. Negative H.pylori. negative celiac sprue.    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 Authorizing Provider  b complex vitamins capsule Take 1 capsule by mouth daily.   Yes [provider]  Calcium Carb-Cholecalciferol (CALCIUM 500 + D PO) Take 2 tablets by mouth 2 (two) times daily.   Yes [provider]  Cetirizine HCl 10 MG TBDP Take 10 mg by mouth daily.   Yes [provider]  DUPIXENT 300 MG/2ML SOPN Inject 2 mLs into the skin every 30 (thirty) days. 09/15/22  Yes [provider]  EUCRISA 2 % OINT Apply 1 application. topically daily as needed. 02/17/22  Yes Althea Charon, FNP  ferrous sulfate 324 MG TBEC Take 324 mg by mouth.   Yes [provider]  fluticasone (FLONASE) 50 MCG/ACT nasal spray 1-2 sprays each nostril daily as needed for stuffy nose. 02/17/22  Yes Althea Charon, FNP  hydrocortisone 2.5 % ointment 1 application 2 times daily as needed to red itchy areas. 02/17/22  Yes Althea Charon, FNP  ketorolac (TORADOL) 10 MG tablet Take 1 tablet (10 mg total) by mouth every 6 (six) hours as needed. 01/05/23  Yes Janyth Pupa, DO  Multiple Vitamins-Minerals (BARIATRIC MULTIVITAMINS/IRON PO) Take 1 tablet by mouth daily.   Yes [provider]  VENTOLIN HFA 108 (90 Base) MCG/ACT inhaler Inhale 2 puffs into the lungs every 6 (six) hours as needed for  wheezing or shortness of breath. 02/17/22  Yes Althea Charon, FNP  FLOVENT HFA 110 MCG/ACT inhaler Use 2 puffs 2 times daily for 1-2 weeks at a time until symptoms return to normal. Patient not taking: Reported on 01/05/2023 02/17/22   Althea Charon, FNP  lidocaine (XYLOCAINE) 2 % solution Use as directed 10 mLs in the mouth or throat every 3 (three) hours as needed for mouth pain. Patient not taking: Reported on 07/08/2022 06/06/22   Volney American, PA-C  triamcinolone ointment (KENALOG) 0.5 % Apply 1 Application topically 2 (two) times daily. Patient not taking: Reported on 11/18/2022 10/13/22   Valentina Shaggy, MD    Family History Family History  Problem Relation Age of Onset  Healthy Mother    Atrial fibrillation Father    Migraines Sister    Hypertension Brother    Heart disease Maternal Grandmother    Cancer Maternal Grandfather        prostate   Stroke Paternal Grandmother    Cancer Paternal Grandfather        prostate   Stroke Paternal Grandfather    Colon cancer Neg Hx    Allergic rhinitis Neg Hx    Angioedema Neg Hx    Asthma Neg Hx    Atopy Neg Hx    Eczema Neg Hx    Urticaria Neg Hx    Immunodeficiency Neg Hx     Social History Social History   Tobacco Use   Smoking status: Never   Smokeless tobacco: Never  Vaping Use   Vaping Use: Never used  Substance Use Topics   Alcohol use: Not Currently    Comment: occassional   Drug use: No     Allergies   Peanut-containing drug products, Propranolol, Cephalexin, Imitrex [sumatriptan], and Nickel   Review of Systems Review of Systems Per HPI  Physical Exam Triage Vital Signs ED Triage Vitals [01/24/23 0913]  Enc Vitals Group     BP 133/87     Pulse Rate 74     Resp 16     Temp 98.1 F (36.7 C)     Temp Source Oral     SpO2 100 %     Weight      Height      Head Circumference      Peak Flow      Pain Score 3     Pain Loc      Pain Edu?      Excl. in [place]?    No data  found.  Updated Vital Signs BP 133/87 (BP Location: Right Arm)   Pulse 74   Temp 98.1 F (36.7 C) (Oral)   Resp 16   LMP 12/28/2022 (Exact Date)   SpO2 100%   Visual Acuity Right Eye Distance:   Left Eye Distance:   Bilateral Distance:    Right Eye Near:   Left Eye Near:    Bilateral Near:     Physical Exam Vitals and nursing note reviewed.  Constitutional:      Appearance: Normal appearance. She is not ill-appearing.  HENT:     Head: Atraumatic.  Eyes:     Extraocular Movements: Extraocular movements intact.     Conjunctiva/sclera: Conjunctivae normal.  Cardiovascular:     Rate and Rhythm: Normal rate and regular rhythm.     Heart sounds: Normal heart sounds.  Pulmonary:     Effort: Pulmonary effort is normal.     Breath sounds: Normal breath sounds.  Musculoskeletal:        General: Normal range of motion.     Cervical back: Normal range of motion and neck supple.  Skin:    General: Skin is warm and dry.     Comments: Pinpoint dark foreign body removed from tick bite under right breast.  Unsure if scab or portion of tick's head.  No surrounding erythema, edema, induration, bleeding or drainage  Neurological:     Mental Status: She is alert and oriented to person, place, and time.  Psychiatric:        Mood and Affect: Mood normal.        Thought Content: Thought content normal.        Judgment: Judgment normal.  UC Treatments / Results  Labs (all labs ordered are listed, but only abnormal results are displayed) Labs Reviewed - No data to display  EKG   Radiology No results found.  Procedures Procedures (including critical care time)  Medications Ordered in UC Medications - No data to display  Initial Impression / Assessment and Plan / UC Course  I have reviewed the triage vital signs and the nursing notes.  Pertinent labs & imaging results that were available during my care of the patient were reviewed by me and considered in my medical  decision making (see chart for details).     Wound was explored, pinpoint foreign body removed and discussed home wound care with soap and water, Neosporin, bandages until fully healed.  Return precautions reviewed.  Final Clinical Impressions(s) / UC Diagnoses   Final diagnoses:  Skin lesion  Tick bite of right breast, initial encounter     Discharge Instructions      Apply Neosporin and a bandage to the area until fully healed to avoid infection    ED Prescriptions   None    PDMP not reviewed this encounter.   Volney American, [place] 01/24/23 1414

## 2023-02-17 ENCOUNTER — Encounter: Payer: Self-pay | Admitting: Allergy & Immunology

## 2023-02-21 ENCOUNTER — Ambulatory Visit (INDEPENDENT_AMBULATORY_CARE_PROVIDER_SITE_OTHER): Payer: No Typology Code available for payment source | Admitting: *Deleted

## 2023-02-21 ENCOUNTER — Encounter: Payer: Self-pay | Admitting: *Deleted

## 2023-02-21 VITALS — BP 117/79 | HR 74 | Ht 64.0 in | Wt 217.0 lb

## 2023-02-21 DIAGNOSIS — Z3201 Encounter for pregnancy test, result positive: Secondary | ICD-10-CM | POA: Diagnosis not present

## 2023-02-21 LAB — POCT URINE PREGNANCY: Preg Test, Ur: POSITIVE — AB

## 2023-02-21 NOTE — Progress Notes (Signed)
   NURSE VISIT- PREGNANCY CONFIRMATION   SUBJECTIVE:  Patricia [name] is a 32 y.o. [phone] female at [redacted]w[redacted]d by certain LMP of Patient's last menstrual period was 01/24/2023 (exact date). Here for pregnancy confirmation.  Home pregnancy test: positive x 6.   She reports cramping.  She is not taking prenatal vitamins.    OBJECTIVE:  BP 117/79 (BP Location: Left Arm, Patient Position: Sitting, Cuff Size: Large)   Pulse 74   Ht 5\' 4"  (1.626 m)   Wt 217 lb (98.4 kg)   LMP 01/24/2023 (Exact Date)   BMI 37.25 kg/m   Appears well, in no apparent distress  Results for orders placed or performed in visit on 02/21/23 (from the past 24 hour(s))  POCT urine pregnancy   Collection Time: 02/21/23 11:28 AM  Result Value Ref Range   Preg Test, Ur Positive (A) Negative    ASSESSMENT: Positive pregnancy test, [redacted]w[redacted]d by LMP    PLAN: Schedule for dating ultrasound in 4 weeks Prenatal vitamins: plans to begin OTC ASAP   Nausea medicines: not currently needed   OB packet given: Yes  Levy Pupa  02/21/2023 11:29 AM

## 2023-02-23 ENCOUNTER — Other Ambulatory Visit: Payer: Self-pay | Admitting: Obstetrics & Gynecology

## 2023-02-23 DIAGNOSIS — O3680X0 Pregnancy with inconclusive fetal viability, not applicable or unspecified: Secondary | ICD-10-CM

## 2023-03-07 NOTE — Telephone Encounter (Signed)
noted 

## 2023-03-21 ENCOUNTER — Other Ambulatory Visit: Payer: No Typology Code available for payment source

## 2023-04-05 ENCOUNTER — Other Ambulatory Visit: Payer: Self-pay | Admitting: Family

## 2023-06-22 ENCOUNTER — Ambulatory Visit (INDEPENDENT_AMBULATORY_CARE_PROVIDER_SITE_OTHER): Payer: No Typology Code available for payment source | Admitting: Allergy & Immunology

## 2023-06-22 ENCOUNTER — Encounter: Payer: Self-pay | Admitting: Allergy & Immunology

## 2023-06-22 DIAGNOSIS — L2089 Other atopic dermatitis: Secondary | ICD-10-CM

## 2023-06-22 DIAGNOSIS — J4530 Mild persistent asthma, uncomplicated: Secondary | ICD-10-CM | POA: Diagnosis not present

## 2023-06-22 DIAGNOSIS — T7800XD Anaphylactic reaction due to unspecified food, subsequent encounter: Secondary | ICD-10-CM

## 2023-06-22 DIAGNOSIS — J3089 Other allergic rhinitis: Secondary | ICD-10-CM | POA: Diagnosis not present

## 2023-06-22 DIAGNOSIS — J302 Other seasonal allergic rhinitis: Secondary | ICD-10-CM

## 2023-06-22 MED ORDER — FLOVENT HFA 110 MCG/ACT IN AERO: INHALATION_SPRAY | RESPIRATORY_TRACT | 5 refills | Status: DC

## 2023-06-22 MED ORDER — EPINEPHRINE 0.3 MG/0.3ML IJ SOAJ: 0.3000 mg | Freq: Once | INTRAMUSCULAR | 2 refills | Status: AC

## 2023-06-22 MED ORDER — VENTOLIN HFA 108 (90 BASE) MCG/ACT IN AERS: 2.0000 | INHALATION_SPRAY | Freq: Four times a day (QID) | RESPIRATORY_TRACT | 1 refills | Status: DC | PRN

## 2023-06-22 NOTE — Progress Notes (Signed)
RE: Patricia [name] MRN: [id number] DOB: 01/14/1991 Date of Telemedicine Visit: 06/22/2023  Referring provider: Benita Stabile, MD Primary care provider: Benita Stabile, MD  Chief Complaint: Medication Management   Telemedicine Follow Up Visit via WebEx: I connected with Patricia [name] for a follow up on 06/22/23 by MyChart Epic Video and verified that I am speaking with the correct person using two identifiers.   I discussed the limitations, risks, security and privacy concerns of performing an evaluation and management service by telemedicine and the availability of in person appointments. I also discussed with the patient that there may be a patient responsible charge related to this service. The patient expressed understanding and agreed to proceed.  Patient is at home.  Provider is at the office.  Visit start time: 12:50 PM Visit end time: 1:20 PM Insurance consent/check in by: Dr. Reece Agar Medical consent and medical assistant/nurse: Dr. Reece Agar  History of Present Illness:  She is a 32 y.o. female, who is being followed for asthma, atopic dermatitis, allergic rhinitis, and peanut allergy. Her previous allergy office visit was in November 2023 with myself. She was last seen in November 2023.  At that time, she continue with her daily dosing every 6 pieces.  We also continue with Zyrtec and Flonase.  Atopic dermatitis was under control with Dupixent every 2 weeks and triamcinolone added during flares.  Spirometry looked good.  We continue with albuterol as needed.  She has Flovent that she adds during respiratory flares.  Since last visit, she has done well.  Asthma/Respiratory Symptom History: Breathing is under good control with the current regimen.  She had a reaction to something over the past weekend. She knows that she had shrimp that night and her mother's dog was all over her. She remembers that when she was pregnant with the boys, she had new allergies pop up and then resolve after delivery. She  realized that her EpiPen and inhaler were out of date. She broke in hives as well. She has not been needing Flovent at all since the last visit. She did start it a few days over the the weekend.   Allergic Rhinitis Symptom History: She is using her  Flonase as needed.  She has not needed antibiotics at all.  She is doing very well with the current management plan. Despite the pregnancy, she is not getting worsening congestion. Her last testing was done in April 2020. At that time, she had testing that was positive to grasses, trees, outdoor mold, and dust mites. She also had testing that was highly reactive to peanut.   Food Allergy Symptom History: She continues with her daily dosing of Reese's Pieces.  She is not eating much beyond her dosing. EpiPen is up to date.   Eczema Symptom Symptom History: Eczema is well controlled with the current regimen. She is doing the gloves at night and layering of the moisturizers. She does not need refills.   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 She is doing very well with this and has made some extensive lifestyle changes.   Due date is December 9th. They are going to induce the week of Thanksgiving. OB is [facility] on [address].     Otherwise, there have been no changes to her past medical history, surgical history, family history, or social history.  Assessment and Plan:  Brighid is a 32 y.o. 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 Anaphylactic shock due to food - Continue with your daily dosing of Reese's pieces.  - EpiPen is up to date.   2. Seasonal and perennial allergic rhinitis - Continue with cetirizine 10mg  daily. -  Continue with fluticasone one spray per nostril up to twice daily as needed.   3. Flexural atopic dermatitis - We are HOLDING Dupixent while you are pregnant and we will plan to restart when you give birth.  - Continue with moisturizing twice daily.  - Continue with triamcinolone 0.5% ointment twice daily on the lesion on your hand.   4. Mild persistent asthma, uncomplicated  - Spirometry not done since this was a televisit, but we will do that at the next visit. - Asthma can either stay stable during pregnancy (1/3 of cases), get better in pregnancy (1/3 of cases), or get worse in pregnancy (1/3 of cases).  - Call us if you are feeling worse and we can make changes if needed.  - Spacer use reviewed. - Daily controller medication(s): NOTHING  - Prior to physical activity: albuterol 2 puffs 10-15 minutes before physical activity. - Rescue medications: albuterol 4 puffs every 4-6 hours as needed - Changes during respiratory infections or worsening symptoms: Add on Flovent to 2 puffs twice daily for TWO WEEKS. - Asthma control goals:  * Full participation in all desired activities (may need albuterol before activity) * Albuterol use two time or less a week on average (not counting use with activity) * Cough interfering with sleep two time or less a month * Oral steroids no more than once a year * No hospitalizations  5. Return in about 6 months (around 12/23/2023).    Diagnostics: None.  Medication List:  Current Outpatient Medications  Medication Sig Dispense Refill   aspirin 81 MG chewable tablet Chew 81 mg by mouth daily.     EPINEPHrine (EPIPEN 2-PAK) 0.3 mg/0.3 mL IJ SOAJ injection Inject 0.3 mg into the muscle once for 1 dose. 2 each 2   Multiple Vitamins-Minerals (HAIR SKIN AND NAILS FORMULA PO) Take 1 tablet by mouth daily.     Prenatal Vit-Fe Fumarate-FA (PRENATAL MULTIVITAMIN) TABS tablet Take 1 tablet by mouth daily at 12 noon.     Calcium Carb-Cholecalciferol  (CALCIUM 500 + D PO) Take 2 tablets by mouth 2 (two) times daily.     Cetirizine HCl 10 MG TBDP Take 10 mg by mouth daily.     ferrous sulfate 324 MG TBEC Take 324 mg by mouth.     FLOVENT HFA 110 MCG/ACT inhaler Use 2 puffs 2 times daily for 1-2 weeks at a time until symptoms return to normal. 12 g 5   fluticasone (FLONASE) 50 MCG/ACT nasal spray 1-2 sprays each nostril daily as needed for stuffy nose. 16 g 5   hydrocortisone 2.5 % ointment APPLY  OINTMENT EXTERNALLY TO AFFECTED AREA TWICE DAILY AS NEEDED TO  RED  ITCHY  AREAS 29 g 5   VENTOLIN HFA 108 (90 Base) MCG/ACT inhaler Inhale 2 puffs into the lungs every 6 (six) hours as needed for wheezing or shortness of breath. 18 g 1   No current facility-administered medications for this visit.   Allergies: Allergies  Allergen Reactions   Peanut-Containing Drug  Products Anaphylaxis and Hives   Propranolol Shortness Of Breath   Cephalexin Hives   Imitrex [Sumatriptan] Hives   Nickel Rash   I reviewed her past medical history, social history, family history, and environmental history and no significant changes have been reported from previous visits.  Review of Systems  Constitutional:  Negative for activity change and appetite change.  HENT:  Negative for congestion, postnasal drip, rhinorrhea, sinus pressure and sore throat.   Eyes:  Negative for pain, discharge, redness and itching.  Respiratory:  Negative for shortness of breath, wheezing and stridor.   Gastrointestinal:  Negative for diarrhea, nausea and vomiting.  Musculoskeletal:  Negative for arthralgias, joint swelling and myalgias.  Skin:  Negative for rash.  Allergic/Immunologic: Negative for environmental allergies and food allergies.  All other systems reviewed and are negative.   Objective:  Physical exam not obtained as encounter was done via telephone.   Previous notes and tests were reviewed.  I discussed the assessment and treatment plan with the patient. The  patient was provided an opportunity to ask questions and all were answered. The patient agreed with the plan and demonstrated an understanding of the instructions.   The patient was advised to call back or seek an in-person evaluation if the symptoms worsen or if the condition fails to improve as anticipated.  I provided 30 minutes of non-face-to-face time during this encounter.  It was my pleasure to participate in Chalco Hally's care today. Please feel free to contact me with any questions or concerns.   Sincerely,  Alfonse Spruce, MD

## 2023-06-22 NOTE — Patient Instructions (Addendum)
1. Anaphylactic shock due to food - Continue with your daily dosing of Reese's pieces.  - EpiPen is up to date.   2. Seasonal and perennial allergic rhinitis - Continue with cetirizine 10mg  daily. - Continue with fluticasone one spray per nostril up to twice daily as needed.   3. Flexural atopic dermatitis - We are HOLDING Dupixent while you are pregnant and we will plan to restart when you give birth.  - Continue with moisturizing twice daily.  - Continue with triamcinolone 0.5% ointment twice daily on the lesion on your hand.   4. Mild persistent asthma, uncomplicated  - Spirometry not done since this was a televisit, but we will do that at the next visit. - Asthma can either stay stable during pregnancy (1/3 of cases), get better in pregnancy (1/3 of cases), or get worse in pregnancy (1/3 of cases).  - Call us if you are feeling worse and we can make changes if needed.  - Spacer use reviewed. - Daily controller medication(s): NOTHING  - Prior to physical activity: albuterol 2 puffs 10-15 minutes before physical activity. - Rescue medications: albuterol 4 puffs every 4-6 hours as needed - Changes during respiratory infections or worsening symptoms: Add on Flovent to 2 puffs twice daily for TWO WEEKS. - Asthma control goals:  * Full participation in all desired activities (may need albuterol before activity) * Albuterol use two time or less a week on average (not counting use with activity) * Cough interfering with sleep two time or less a month * Oral steroids no more than once a year * No hospitalizations  5. Return in about 6 months (around 12/23/2023).    Please inform us of any Emergency Department visits, hospitalizations, or changes in symptoms. Call us before going to the ED for breathing or allergy symptoms since we might be able to fit you in for a sick visit. Feel free to contact us anytime with any questions, problems, or concerns.  It was a pleasure to see you  again today! We LOVE you guys!   Websites that have reliable patient information: 1. American Academy of Asthma, Allergy, and Immunology: www.aaaai.org 2. Food Allergy Research and Education (FARE): foodallergy.org 3. Mothers of Asthmatics: http://www.asthmacommunitynetwork.org 4. American College of Allergy, Asthma, and Immunology: www.acaai.org   COVID-19 Vaccine Information can be found at: [email or web address] For questions related to vaccine distribution or appointments, please email [email or web address] or call [phone].   We realize that you might be concerned about having an allergic reaction to the COVID19 vaccines. To help with that concern, WE ARE OFFERING THE COVID19 VACCINES IN OUR OFFICE! Ask the front desk for dates!     "Like" [place] on Facebook and Instagram for our latest updates!      A healthy democracy works best when [employer] participate! Make sure you are registered to vote! If you have moved or changed any of your contact information, you will need to get this updated before voting!  In some cases, you MAY be able to register to vote online: [email or web address]

## 2023-06-27 ENCOUNTER — Other Ambulatory Visit: Payer: Self-pay

## 2023-06-27 MED ORDER — ALBUTEROL SULFATE HFA 108 (90 BASE) MCG/ACT IN AERS: 2.0000 | INHALATION_SPRAY | RESPIRATORY_TRACT | 5 refills | Status: AC | PRN

## 2023-06-28 ENCOUNTER — Telehealth: Payer: Self-pay

## 2023-06-28 ENCOUNTER — Other Ambulatory Visit (HOSPITAL_COMMUNITY): Payer: Self-pay

## 2023-06-28 NOTE — Telephone Encounter (Signed)
Pharmacy Patient Advocate Encounter   Received notification from CoverMyMeds that prior authorization for Flovent HFA 110MCG/ACT aerosol is required/requested.   Insurance verification completed.   The patient is insured through [facility] .   Per test claim: PA required; PA submitted to [facility] via CoverMyMeds Key/confirmation #/[id number] [facility] Status is pending

## 2023-06-29 ENCOUNTER — Other Ambulatory Visit (HOSPITAL_COMMUNITY): Payer: Self-pay

## 2023-07-04 ENCOUNTER — Other Ambulatory Visit (HOSPITAL_COMMUNITY): Payer: Self-pay

## 2023-07-04 MED ORDER — FLUTICASONE PROPIONATE HFA 110 MCG/ACT IN AERO: INHALATION_SPRAY | RESPIRATORY_TRACT | 5 refills | Status: AC

## 2023-07-04 NOTE — Telephone Encounter (Signed)
Pharmacy Patient Advocate Encounter  Received notification from [facility] that Prior Authorization for Flovent HFA 110MCG/ACT aerosol has been DENIED. Please advise how you'd like to proceed. Full denial letter will be uploaded to the media tab. See denial reason below.  The Community and [facility] Prior Authorization Team is not able to review this request because the generic version of the requested product is preferred on the formulary and is covered without prior authorization requirements. The pharmacy may obtain assistance by contacting the [facility] Help Desk anytime at [phone]  PA #/Case ID/Reference #: [facility]

## 2023-07-04 NOTE — Telephone Encounter (Signed)
Flovent has been sent again into the pharmacy.

## 2023-07-06 ENCOUNTER — Ambulatory Visit: Payer: No Typology Code available for payment source | Admitting: Adult Health

## 2023-07-18 ENCOUNTER — Other Ambulatory Visit (HOSPITAL_COMMUNITY): Payer: Self-pay

## 2023-07-19 ENCOUNTER — Telehealth: Payer: Self-pay

## 2023-07-19 NOTE — Telephone Encounter (Signed)
Pharmacy Patient Advocate Encounter  Received notification from [facility] that Prior Authorization for Fluticasone Propionate HFA 110MCG/ACT aerosol has been DENIED. Please advise how you'd like to proceed. Full denial letter will be uploaded to the media tab. See denial reason below.  The request for coverage for FLUTICAS HFA AER , used as directed (12 per month), is denied. This decision is based on health plan criteria for FLUTICAS HFA AER . This medicine is covered if: You have a history  of failure, contraindication or intolerance to both of the following: Arnuity Ellipta and QVAR Redihaler. The information provided does not show that you meet the criteria listed above  PA #/Case ID/Reference #: [id number]  Please be advised we currently do not have a Pharmacist to review denials, therefore you will need to process appeals accordingly as needed. Thanks for your support at this time. Contact for appeals are as follows: Phone: [phone], Fax: [phone]  Last day to appeal is 180 days from denial ( 07-18-2023 )

## 2023-07-20 MED ORDER — QVAR REDIHALER 80 MCG/ACT IN AERB: INHALATION_SPRAY | RESPIRATORY_TRACT | 5 refills | Status: DC

## 2023-07-20 NOTE — Telephone Encounter (Signed)
I sent in Qvar.   [name], MD [facility]

## 2023-12-12 ENCOUNTER — Encounter: Payer: Self-pay | Admitting: Advanced Practice Midwife

## 2023-12-12 ENCOUNTER — Other Ambulatory Visit (HOSPITAL_COMMUNITY)
Admission: RE | Admit: 2023-12-12 | Discharge: 2023-12-12 | Disposition: A | Discharge status: Home / Self Care | Payer: No Typology Code available for payment source | Source: Ambulatory Visit | Attending: Advanced Practice Midwife | Admitting: Advanced Practice Midwife

## 2023-12-12 ENCOUNTER — Ambulatory Visit: Payer: No Typology Code available for payment source | Admitting: Advanced Practice Midwife

## 2023-12-12 VITALS — BP 135/80 | HR 69 | Ht 64.0 in | Wt 227.0 lb

## 2023-12-12 DIAGNOSIS — B9689 Other specified bacterial agents as the cause of diseases classified elsewhere: Secondary | ICD-10-CM

## 2023-12-12 DIAGNOSIS — Z202 Contact with and (suspected) exposure to infections with a predominantly sexual mode of transmission: Secondary | ICD-10-CM

## 2023-12-12 DIAGNOSIS — N760 Acute vaginitis: Secondary | ICD-10-CM | POA: Diagnosis not present

## 2023-12-12 DIAGNOSIS — N9489 Other specified conditions associated with female genital organs and menstrual cycle: Secondary | ICD-10-CM

## 2023-12-12 DIAGNOSIS — N898 Other specified noninflammatory disorders of vagina: Secondary | ICD-10-CM

## 2023-12-12 MED ORDER — METRONIDAZOLE 500 MG PO TABS: 500.0000 mg | ORAL_TABLET | Freq: Two times a day (BID) | ORAL | 0 refills | Status: DC

## 2023-12-12 MED ORDER — NYSTATIN-TRIAMCINOLONE 100000-0.1 UNIT/GM-% EX OINT: 1.0000 | TOPICAL_OINTMENT | Freq: Two times a day (BID) | CUTANEOUS | 0 refills | Status: DC

## 2023-12-12 NOTE — Progress Notes (Signed)
[facility] Visit  Patient name: Patricia [name] MRN [id number]  Date of birth: Mar 22, 1991  CC & HPI:  Patricia [name] is a 33 y.o.  female presenting today for vaginal itch and burning for 4-5 days. Sx are external and internal, and today starting feeling external burning when voiding, not specific to one area.  Partner stepped out on her in December, wants full STI testing.   Pertinent History Reviewed:  Medical & Surgical Hx:   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 05/28/2020   Pap +HPV other and [facility], will need colpo per ASCCP guidelines,immediate  risk for CIN 3+ 12.6%    Asthma    Cough 08/01/2015   Depression    Fatty liver    Flexural atopic dermatitis 05/28/2022   GERD (gastroesophageal reflux disease)    HSIL (high grade squamous intraepithelial lesion) on Pap smear of cervix 06/04/2022   06/04/22 colpo per ASCCP guidelines, immediate risk is 23 %   Migraine    Migraines    Nausea and vomiting during pregnancy prior to [redacted] weeks gestation 08/01/2015   Nausea vomiting and diarrhea    Obesity    PCO (polycystic ovaries) 03/08/2014   Polycystic disease, ovaries    Pregnancy induced hypertension    Pregnant 05/27/2015   URI (upper respiratory infection) 08/01/2015   URI (upper respiratory infection) 08/01/2015   Past Surgical History:  Procedure Laterality Date   BIOPSY  01/20/2021   Procedure: BIOPSY;  Surgeon: Lanelle Bal, DO;  Location: [facility];  Service: Endoscopy;;   CESAREAN SECTION  10/11/2023   CHOLECYSTECTOMY N/A 06/08/2017   Procedure: LAPAROSCOPIC CHOLECYSTECTOMY;  Surgeon: Franky Macho, MD;  Location: [facility] ORS;  Service: General;  Laterality: N/A;   ESOPHAGOGASTRODUODENOSCOPY (EGD) WITH PROPOFOL N/A 01/20/2021   LA Grade A reflux esophagitis, gastritis s/p biopsy, normal duodenum s/p biopsy. Negative H.pylori. negative celiac sprue.    LEEP N/A 07/14/2022    Procedure: LOOP ELECTROSURGICAL EXCISION PROCEDURE (LEEP);  Surgeon: Myna Hidalgo, DO;  Location: [facility] ORS;  Service: Gynecology;  Laterality: N/A;   sleeve gastrectomy     Dec 2022   TIF  08/2021   TONSILLECTOMY     WISDOM TOOTH EXTRACTION     Family History  Problem Relation Age of Onset   Cancer Paternal Grandfather        prostate   Stroke Paternal Grandfather    Stroke Paternal Grandmother    Heart disease Maternal Grandmother    Cancer Maternal Grandfather        prostate   Atrial fibrillation Father    Healthy Mother    Hypertension Brother    Migraines Sister    Asthma Son    Asthma Son    Klinefelter's syndrome Son    Colon cancer Neg Hx    Allergic rhinitis Neg Hx    Angioedema Neg Hx    Atopy Neg Hx    Eczema Neg Hx    Urticaria Neg Hx    Immunodeficiency Neg Hx     Current Outpatient Medications:    albuterol (PROVENTIL HFA) 108 (90 Base) MCG/ACT inhaler, Inhale 2 puffs into the lungs every 4 (four) hours as needed for wheezing or shortness of breath., Disp: 1 each, Rfl: 5   beclomethasone (QVAR REDIHALER) 80 MCG/ACT inhaler, Use two puffs twice daily for one to two weeks during flares., Disp: 1 each, Rfl: 5   Calcium  Carb-Cholecalciferol (CALCIUM 500 + D PO), Take 2 tablets by mouth 2 (two) times daily., Disp: , Rfl:    Cetirizine HCl 10 MG TBDP, Take 10 mg by mouth daily., Disp: , Rfl:    EPINEPHrine 0.3 mg/0.3 mL IJ SOAJ injection, into the thigh., Disp: , Rfl:    ferrous sulfate 324 MG TBEC, Take 324 mg by mouth., Disp: , Rfl:    fluticasone (FLONASE) 50 MCG/ACT nasal spray, 1-2 sprays each nostril daily as needed for stuffy nose., Disp: 16 g, Rfl: 5   fluticasone (FLOVENT HFA) 110 MCG/ACT inhaler, Use 2 puffs 2 times daily for 1-2 weeks at a time until symptoms return to normal., Disp: 12 g, Rfl: 5   hydrocortisone 2.5 % ointment, APPLY  OINTMENT EXTERNALLY TO AFFECTED AREA TWICE DAILY AS NEEDED TO  RED  ITCHY  AREAS, Disp: 29 g, Rfl: 5   metroNIDAZOLE  (FLAGYL) 500 MG tablet, Take 1 tablet (500 mg total) by mouth 2 (two) times daily., Disp: 14 tablet, Rfl: 0   NIFEdipine (PROCARDIA XL/NIFEDICAL XL) 60 MG 24 hr tablet, Take 60 mg by mouth daily., Disp: , Rfl:    nystatin-triamcinolone ointment (MYCOLOG), Apply 1 Application topically 2 (two) times daily., Disp: 30 g, Rfl: 0   Prenatal Vit-Fe Fumarate-FA (PRENATAL MULTIVITAMIN) TABS tablet, Take 1 tablet by mouth daily at 12 noon., Disp: , Rfl:    sertraline (ZOLOFT) 50 MG tablet, Take 1 tablet by mouth daily., Disp: , Rfl:  Social History: Reviewed -  reports that she has never smoked. She has never used smokeless tobacco.  Review of Systems:   Constitutional: Negative for fever and chills Eyes: Negative for visual disturbances Respiratory: Negative for shortness of breath, dyspnea Cardiovascular: Negative for chest pain or palpitations  Gastrointestinal: Negative for vomiting, diarrhea and constipation; no abdominal pain Genitourinary: Negative for dysuria and urgency, vaginal irritation or itching Musculoskeletal: Negative for back pain, joint pain, myalgias  Neurological: Negative for dizziness and headaches    Objective Findings:    Physical Examination: Vitals:   12/12/23 1132  BP: 135/80  Pulse: 69   General appearance - well appearing, and in no distress Mental status - alert, oriented to person, place, and time Chest:  Normal respiratory effort Heart - normal rate and regular rhythm Abdomen:  Soft, nontender Pelvic: No lesions/condylomas;  SSE:  small amount of dark brown d/c; no erythema or lesions.  Wet prep +for clue cells and WBC, scant yeast. No trichomonas.  Musculoskeletal:  Normal range of motion without pain Extremities:  No edema    No results found for this or any previous visit (from the past 24 hours).    Assessment & Plan:  A:   BV  Some yeast P:   Orders Placed This Encounter  Procedures   RPR   HIV Antibody (routine testing w rflx)    Aptima Swab for STDs  Meds ordered this encounter  Medications   metroNIDAZOLE (FLAGYL) 500 MG tablet    Sig: Take 1 tablet (500 mg total) by mouth 2 (two) times daily.    Dispense:  14 tablet    Refill:  0    Supervising Provider:   Duane Lope H [[id number]]   nystatin-triamcinolone ointment (MYCOLOG)    Sig: Apply 1 Application topically 2 (two) times daily.    Dispense:  30 g    Refill:  0    Supervising Provider:   Duane Lope H [[id number]]      No follow-ups on file.  Scarlette Calico  [place] CNM 12/12/2023 12:07 PM

## 2023-12-12 NOTE — Patient Instructions (Signed)

## 2023-12-12 NOTE — Addendum Note (Signed)
Addended by: Moss Mc on: 12/12/2023 12:36 PM   Modules accepted: Orders

## 2023-12-13 LAB — CERVICOVAGINAL ANCILLARY ONLY
Chlamydia: NEGATIVE
Comment: NEGATIVE
Comment: NEGATIVE
Comment: NORMAL
Neisseria Gonorrhea: NEGATIVE
Trichomonas: NEGATIVE

## 2023-12-13 LAB — RPR: RPR Ser Ql: NONREACTIVE

## 2023-12-13 LAB — HIV ANTIBODY (ROUTINE TESTING W REFLEX): HIV Screen 4th Generation wRfx: NONREACTIVE

## 2024-01-09 ENCOUNTER — Ambulatory Visit: Payer: No Typology Code available for payment source | Admitting: Adult Health

## 2024-01-13 ENCOUNTER — Ambulatory Visit: Payer: No Typology Code available for payment source | Admitting: Allergy & Immunology

## 2024-02-10 ENCOUNTER — Ambulatory Visit: Payer: No Typology Code available for payment source | Admitting: Allergy & Immunology

## 2024-02-10 ENCOUNTER — Encounter: Payer: Self-pay | Admitting: Allergy & Immunology

## 2024-02-10 ENCOUNTER — Other Ambulatory Visit: Payer: Self-pay

## 2024-02-10 VITALS — BP 120/80 | HR 77 | Temp 97.6°F | Resp 16 | Ht 64.0 in | Wt 227.0 lb

## 2024-02-10 DIAGNOSIS — J302 Other seasonal allergic rhinitis: Secondary | ICD-10-CM

## 2024-02-10 DIAGNOSIS — J3089 Other allergic rhinitis: Secondary | ICD-10-CM

## 2024-02-10 DIAGNOSIS — J4530 Mild persistent asthma, uncomplicated: Secondary | ICD-10-CM | POA: Diagnosis not present

## 2024-02-10 DIAGNOSIS — L2089 Other atopic dermatitis: Secondary | ICD-10-CM | POA: Diagnosis not present

## 2024-02-10 DIAGNOSIS — T7800XD Anaphylactic reaction due to unspecified food, subsequent encounter: Secondary | ICD-10-CM

## 2024-02-10 NOTE — Progress Notes (Signed)
 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 Anaphylactic shock due to food - Last labs were VERY reassuring, so you can eat peanut without a problem.  - EpiPen is up to date.   2. Seasonal and perennial allergic rhinitis - Continue with cetirizine 10mg  daily. - Continue with fluticasone one spray per nostril up to twice daily as needed.   3. Flexural atopic dermatitis - failed Eucrisa, hydrocortisone, triamcinolone, and clobetasol - Continue with your current regimen. - Your skin looks great today.  - Continue with moisturizing twice daily.  - Continue with triamcinolone 0.5% ointment twice daily on the lesion on your hand.  - We will work on [employer] approval when you have weaned off the breastfeeding.   4. Mild persistent asthma, uncomplicated  - Cleda Daub look excellent. - Daily controller medication(s): NOTHING  - Prior to physical activity: albuterol 2 puffs 10-15 minutes before physical activity. - Rescue medications: albuterol 4 puffs every 4-6 hours as needed - Changes during respiratory infections or worsening symptoms: Add on Flovent to 2 puffs twice daily for TWO WEEKS. - Asthma control goals:  * Full participation in all desired activities (may need albuterol before activity) * Albuterol use two time or less a week on average (not counting use with activity) * Cough interfering with sleep two time or less a month * Oral steroids no more than once a year * No hospitalizations  5. Return in about 6 months (around 08/12/2024). You can have the follow up appointment with Dr. Dellis Anes or a Nurse Practicioner (our Nurse Practitioners are excellent and always have Physician  oversight!).   Subjective:   Patricia [name] is a 33 y.o. female presenting today for follow up of  Chief Complaint  Patient presents with   Asthma    Patricia [name] has a history of the following: Patient Active Problem List   Diagnosis Date Noted   HSIL (high grade squamous intraepithelial lesion) on Pap smear of cervix 06/04/2022   ASCUS with positive high risk HPV cervical 05/31/2022   Urinary incontinence 05/31/2022   Routine general medical examination at a health care facility 05/31/2022   Mixed stress and urge urinary incontinence 05/31/2022   Mild persistent asthma, uncomplicated 05/28/2022   Flexural atopic dermatitis 05/28/2022   Pap smear of cervix with ASCUS, cannot exclude HGSIL 05/26/2021   Hip pain, right 05/26/2021   Family planning 05/26/2021   RUQ pain 04/22/2021   Esophagitis 04/22/2021   Pelvic pain 11/28/2020   Hypertension 07/15/2020   Abnormal Papanicolaou smear of cervix with positive human papilloma virus (HPV) test 05/28/2020   Nexplanon removal 05/20/2020   Encounter for gynecological examination with Papanicolaou smear of cervix 05/20/2020   Depression 05/20/2020   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   Fatty liver 11/29/2019   Diarrhea 09/27/2019   Elevated LFTs 06/17/2017   Calculus of gallbladder with acute cholecystitis without obstruction    Nausea vomiting and diarrhea    RUQ abdominal pain    Transaminitis    Biliary colic    Gall stones, common bile duct    Abdominal pain 06/04/2017   Chronic hypertension complicating or reason for care during childbirth  04/13/2017   Chronic hypertension in pregnancy 04/13/2017   Chronic hypertension affecting pregnancy 11/08/2016   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  Discussed the use of AI scribe software for clinical note transcription with the patient and/or guardian, who gave verbal consent to proceed.  Patricia [name] is a 33 y.o. female presenting for a follow up visit. She was last seen in July 2024. At that time, we continued with her daily dosing of peanuts. For her rhinitis, we continued with cetirizine and fluticasone. We continued to hold her Dupixent because she was pregnant. With continued with the use of triamcinolone 0.5% ointment twice daily.  For her asthma, we continued with albuterol as needed and Flovent added during respiratory flares.  We discussed the normal course of asthma during pregnancy.  Since last visit, she has largely done well.  Asthma/Respiratory Symptom History: Asthma is currently well-controlled with no recent exacerbations. She uses Zyrtec effectively and has not restarted Dupixent. Flovent was used only once or twice during pregnancy and has not been needed since. She needs a new inhaler. Patricia [name]'s asthma has been well controlled. She has not required rescue medication, experienced nocturnal awakenings due to lower respiratory symptoms, nor have activities of daily living been limited. She has required no Emergency Department or Urgent Care visits for her asthma. She has required zero courses of systemic steroids for asthma exacerbations since the last visit. ACT score today is 25, indicating excellent asthma symptom control.   Allergic Rhinitis Symptom History: Her environmental allergies are under good control with cetirizine.  She does not use her Flonase much at all.  She has not had any antibiotics at all for sinus infections or ear infections.  Food Allergy Symptom History: She is  not really doing her peanut dosing, but the last peanut component testing was actually very good with an IgE of 0.11 to Ara h 2 only.  She does need a new EpiPen.  Skin Symptom History: Eczema is currently well-managed. She has used Elidel, tacrolimus, and Eucrisa in the past. There is a seasonal pattern to her eczema, with flare-ups typically occurring between certain months. Dupixent, previously approved for her eczema, has not been restarted.  She has failed a number of topical treatments including Eucrisa as well as hydrocortisone, triamcinolone, and clobetasol.  She would like to get back on her Dupixent once she has done breast-feeding.  She is going to be done with maternity leave after the first week of April.  She actually works from home and is planning to keep her kids at home and work at the same time.  She is concerned about her children's potential for allergies due to a family history of allergies. She is considering introducing allergenic foods like peanuts and eggs to her children. Her children are breastfed and have not yet started on solid foods.  They are 76 months old now.  Otherwise, there have been no changes to her past medical history, surgical history, family history, or social history.    Review of systems otherwise negative other than that mentioned in the HPI.  Objective:   Blood pressure 120/80, pulse 77, temperature 97.6 F (36.4 C), resp. rate 16, height 5\' 4"  (1.626 m), weight 227 lb (103 kg), SpO2 96%, currently breastfeeding. Body mass index is 38.96 kg/m.    Physical Exam Vitals reviewed.  Constitutional:      Appearance: She is well-developed.     Comments: Very lovely as always. Talkative. She has lost a lot of weight since we last saw her.   HENT:     Head: Normocephalic and atraumatic.     Right Ear: Tympanic membrane, ear canal and external ear normal.     Left Ear: Tympanic membrane, ear canal and external ear normal.     Nose: No nasal  deformity, septal deviation, mucosal edema or rhinorrhea.     Right Turbinates: Enlarged, swollen and pale.     Left Turbinates: Enlarged, swollen and pale.     Right Sinus: No maxillary sinus tenderness or frontal sinus tenderness.     Left Sinus: No maxillary sinus tenderness or frontal sinus tenderness.     Comments: No nasal polyps noted.     Mouth/Throat:     Mouth: Mucous membranes are not pale and not dry.     Pharynx: Uvula midline.  Eyes:     General:        Right eye: No discharge.        Left eye: No discharge.     Conjunctiva/sclera: Conjunctivae normal.     Right eye: Right conjunctiva is not injected. No chemosis.    Left eye: Left conjunctiva is not injected. No chemosis.    Pupils: Pupils are equal, round, and reactive to light.  Cardiovascular:     Rate and Rhythm: Normal rate and regular rhythm.     Heart sounds: Normal heart sounds.  Pulmonary:     Effort: Pulmonary effort is normal. No tachypnea, accessory muscle usage or respiratory distress.     Breath sounds: Normal breath sounds. No wheezing, rhonchi or rales.  Chest:     Chest wall: No tenderness.  Lymphadenopathy:     Cervical: No cervical adenopathy.  Skin:    General: Skin is warm.     Capillary Refill: Capillary refill takes less than 2 seconds.     Coloration: Skin is not pale.     Findings: No abrasion, erythema, petechiae or rash. Rash is not papular, urticarial or vesicular.     Comments: Her hands look particularly good today. The lesions that was on her right hand has since cleared.   Neurological:     Mental Status: She is alert.  Psychiatric:        Behavior: Behavior is cooperative.      Diagnostic studies:    Spirometry: results normal (FEV1: 2.36/87%, FVC: 3.45/107%, FEV1/FVC: 68%).    Spirometry consistent with normal pattern.   Allergy Studies: none       [name], MD  [facility]

## 2024-02-10 NOTE — Patient Instructions (Addendum)
 1. Anaphylactic shock due to food - Last labs were VERY reassuring, so you can eat peanut without a problem.  - EpiPen is up to date.   2. Seasonal and perennial allergic rhinitis - Continue with cetirizine 10mg  daily. - Continue with fluticasone one spray per nostril up to twice daily as needed.   3. Flexural atopic dermatitis - failed Eucrisa, hydrocortisone, triamcinolone, and clobetasol - Continue with your current regimen. - Your skin looks great today.  - Continue with moisturizing twice daily.  - Continue with triamcinolone 0.5% ointment twice daily on the lesion on your hand.  - We will work on [employer] approval when you have weaned off the breastfeeding.   4. Mild persistent asthma, uncomplicated  - Cleda Daub look excellent. - Daily controller medication(s): NOTHING  - Prior to physical activity: albuterol 2 puffs 10-15 minutes before physical activity. - Rescue medications: albuterol 4 puffs every 4-6 hours as needed - Changes during respiratory infections or worsening symptoms: Add on Flovent to 2 puffs twice daily for TWO WEEKS. - Asthma control goals:  * Full participation in all desired activities (may need albuterol before activity) * Albuterol use two time or less a week on average (not counting use with activity) * Cough interfering with sleep two time or less a month * Oral steroids no more than once a year * No hospitalizations  5. Return in about 6 months (around 08/12/2024). You can have the follow up appointment with Dr. Dellis Anes or a Nurse Practicioner (our Nurse Practitioners are excellent and always have Physician oversight!).    Please inform us of any Emergency Department visits, hospitalizations, or changes in symptoms. Call us before going to the ED for breathing or allergy symptoms since we might be able to fit you in for a sick visit. Feel free to contact us anytime with any questions, problems, or concerns.  It was a pleasure to see you again as  well as Ell[place]e Sia and [place] today!  Websites that have reliable patient information: 1. American Academy of Asthma, Allergy, and Immunology: www.aaaai.org 2. Food Allergy Research and Education (FARE): foodallergy.org 3. Mothers of Asthmatics: http://www.asthmacommunitynetwork.org 4. American College of Allergy, Asthma, and Immunology: www.acaai.org      "Like" [place] on Facebook and Instagram for our latest updates!      A healthy democracy works best when [employer] participate! Make sure you are registered to vote! If you have moved or changed any of your contact information, you will need to get this updated before voting! Scan the QR codes below to learn more!

## 2024-02-24 ENCOUNTER — Ambulatory Visit
Admission: EM | Admit: 2024-02-24 | Discharge: 2024-02-24 | Disposition: A | Discharge status: Home / Self Care | Attending: Family Medicine | Admitting: Family Medicine

## 2024-02-24 DIAGNOSIS — L03012 Cellulitis of left finger: Secondary | ICD-10-CM | POA: Diagnosis not present

## 2024-02-24 MED ORDER — CLINDAMYCIN HCL 300 MG PO CAPS: 300.0000 mg | ORAL_CAPSULE | Freq: Two times a day (BID) | ORAL | 0 refills | Status: DC

## 2024-02-24 NOTE — ED Triage Notes (Signed)
 Left ring finger redness and swelling x 4 days. Pt states she just done with 10 day course of amoxicillin for a tooth infection.

## 2024-02-24 NOTE — ED Provider Notes (Signed)
 [facility]    CSN: [id number] Arrival date & time: 02/24/24  2841      History   Chief Complaint Chief Complaint  Patient presents with   Hand Pain    HPI Patricia [name] is a 33 y.o. female.   Patient presenting today with 4-day history of left ring finger nailbed redness, swelling, tenderness.  States she had a hangnail prior to onset of symptoms that she picked off.  Denies fever, chills, drainage, bleeding, numbness, tingling, loss of range of motion.  So far not tried anything over-the-counter for symptoms but notes she did just finish a course of amoxicillin for a dental infection about 4 days ago.  Of note, she is currently breast-feeding.    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 05/28/2020   Pap +HPV other and [facility], will need colpo per ASCCP guidelines,immediate  risk for CIN 3+ 12.6%    Asthma    Cough 08/01/2015   Depression    Fatty liver    Flexural atopic dermatitis 05/28/2022   GERD (gastroesophageal reflux disease)    HSIL (high grade squamous intraepithelial lesion) on Pap smear of cervix 06/04/2022   06/04/22 colpo per ASCCP guidelines, immediate risk is 23 %   Migraine    Migraines    Nausea and vomiting during pregnancy prior to [redacted] weeks gestation 08/01/2015   Nausea vomiting and diarrhea    Obesity    PCO (polycystic ovaries) 03/08/2014   Polycystic disease, ovaries    Pregnancy induced hypertension    Pregnant 05/27/2015   URI (upper respiratory infection) 08/01/2015   URI (upper respiratory infection) 08/01/2015    Patient Active Problem List   Diagnosis Date Noted   HSIL (high grade squamous intraepithelial lesion) on Pap smear of cervix 06/04/2022   ASCUS with positive high risk HPV cervical 05/31/2022   Urinary incontinence 05/31/2022   Routine general medical examination at a health care facility 05/31/2022   Mixed stress and urge urinary  incontinence 05/31/2022   Mild persistent asthma, uncomplicated 05/28/2022   Flexural atopic dermatitis 05/28/2022   Pap smear of cervix with ASCUS, cannot exclude HGSIL 05/26/2021   Hip pain, right 05/26/2021   Family planning 05/26/2021   RUQ pain 04/22/2021   Esophagitis 04/22/2021   Pelvic pain 11/28/2020   Hypertension 07/15/2020   Abnormal Papanicolaou smear of cervix with positive human papilloma virus (HPV) test 05/28/2020   Nexplanon removal 05/20/2020   Encounter for gynecological examination with Papanicolaou smear of cervix 05/20/2020   Depression 05/20/2020   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   Fatty liver 11/29/2019   Diarrhea 09/27/2019   Elevated LFTs 06/17/2017   Calculus of gallbladder with acute cholecystitis without obstruction    Nausea vomiting and diarrhea    RUQ abdominal pain    Transaminitis    Biliary colic    Gall stones, common bile duct    Abdominal pain 06/04/2017   Chronic hypertension complicating or reason for care during childbirth 04/13/2017   Chronic hypertension in pregnancy 04/13/2017   Chronic hypertension affecting pregnancy 11/08/2016   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   Supervision of high risk pregnancy, antepartum 09/27/2016   History of gestational hypertension 09/27/2016   History of shoulder dystocia in prior pregnancy, currently pregnant 09/27/2016   URI (upper respiratory infection) 08/01/2015   Persistent cough 08/01/2015   Obesity, Class III, BMI 40-49.9 (morbid obesity) (HCC)  09/23/2014   Headache 09/23/2014   PCO (polycystic ovaries) 03/08/2014    Past Surgical History:  Procedure Laterality Date   BIOPSY  01/20/2021   Procedure: BIOPSY;  Surgeon: Lanelle Bal, DO;  Location[facility];  Service: Endoscopy;;   CESAREAN SECTION  10/11/2023   CHOLECYSTECTOMY N/A 06/08/2017   Procedure: LAPAROSCOPIC CHOLECYSTECTOMY;  Surgeon: Franky Macho, MD;  Location: [facility];   Service: General;  Laterality: N/A;   ESOPHAGOGASTRODUODENOSCOPY (EGD) WITH PROPOFOL N/A 01/20/2021   LA Grade A reflux esophagitis, gastritis s/p biopsy, normal duodenum s/p biopsy. Negative H.pylori. negative celiac sprue.    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 Authorizing Provider  Calcium Carb-Cholecalciferol (CALCIUM 500 + D PO) Take 2 tablets by mouth 2 (two) times daily.   Yes [provider]  clindamycin (CLEOCIN) 300 MG capsule Take 1 capsule (300 mg total) by mouth 2 (two) times daily. 02/24/24  Yes Particia Nearing, PA-C  ferrous sulfate 324 MG TBEC Take 324 mg by mouth.   Yes [provider]  Prenatal Vit-Fe Fumarate-FA (PRENATAL MULTIVITAMIN) TABS tablet Take 1 tablet by mouth daily at 12 noon.   Yes [provider]  sertraline (ZOLOFT) 50 MG tablet Take 1 tablet by mouth daily. 12/01/23 11/30/24 Yes [provider]  albuterol (PROVENTIL HFA) 108 (90 Base) MCG/ACT inhaler Inhale 2 puffs into the lungs every 4 (four) hours as needed for wheezing or shortness of breath. 06/27/23   Alfonse Spruce, MD  beclomethasone (QVAR REDIHALER) 80 MCG/ACT inhaler Use two puffs twice daily for one to two weeks during flares. 07/20/23   Alfonse Spruce, MD  Cetirizine HCl 10 MG TBDP Take 10 mg by mouth daily.    [provider]  EPINEPHrine 0.3 mg/0.3 mL IJ SOAJ injection into the thigh. 06/22/23   [provider]  fluticasone  (FLONASE) 50 MCG/ACT nasal spray 1-2 sprays each nostril daily as needed for stuffy nose. 02/17/22   Nehemiah Settle, FNP  fluticasone (FLOVENT HFA) 110 MCG/ACT inhaler Use 2 puffs 2 times daily for 1-2 weeks at a time until symptoms return to normal. 07/04/23   Alfonse Spruce, MD  hydrocortisone 2.5 % ointment APPLY  OINTMENT EXTERNALLY TO AFFECTED AREA TWICE DAILY AS NEEDED TO  RED  ITCHY  AREAS 04/06/23   Nehemiah Settle, FNP  NIFEdipine (PROCARDIA XL/NIFEDICAL XL) 60 MG 24 hr tablet Take 60 mg by mouth daily. 12/06/23   [provider]  nystatin-triamcinolone ointment (MYCOLOG) Apply 1 Application topically 2 (two) times daily. 12/12/23   Jacklyn Shell, CNM    Family History Family History  Problem Relation Age of Onset   Cancer  Paternal Grandfather        prostate   Stroke Paternal Grandfather    Stroke Paternal Grandmother    Heart disease Maternal Grandmother    Cancer Maternal Grandfather        prostate   Atrial fibrillation Father    Healthy Mother    Hypertension Brother    Migraines Sister    Asthma Son    Asthma Son    Klinefelter's syndrome Son    Colon cancer Neg Hx    Allergic rhinitis Neg Hx    Angioedema Neg Hx    Atopy Neg Hx    Eczema Neg Hx    Urticaria Neg Hx    Immunodeficiency Neg Hx     Social History Social History   Tobacco Use   Smoking status: Never   Smokeless tobacco: Never  Vaping Use   Vaping status: Never Used  Substance Use Topics   Alcohol use: Not Currently    Comment: occassional   Drug use: No     Allergies   Peanut-containing drug products, Propranolol, Cephalexin, Imitrex [sumatriptan], and Nickel   Review of Systems Review of Systems PER HPI  Physical Exam Triage Vital Signs ED Triage Vitals  Encounter Vitals Group     BP 02/24/24 0958 (!) 139/95     Systolic BP Percentile --      Diastolic BP Percentile --      Pulse Rate 02/24/24 0958 74     Resp 02/24/24 0958 16     Temp 02/24/24 0958  98.2 F (36.8 C)     Temp Source 02/24/24 0958 Oral     SpO2 02/24/24 0958 95 %     Weight --      Height --      Head Circumference --      Peak Flow --      Pain Score 02/24/24 1000 1     Pain Loc --      Pain Education --      Exclude from Growth Chart --    No data found.  Updated Vital Signs BP (!) 139/95 (BP Location: Right Arm)   Pulse 74   Temp 98.2 F (36.8 C) (Oral)   Resp 16   LMP 02/13/2024 (Approximate)   SpO2 95%   Breastfeeding Yes   Visual Acuity Right Eye Distance:   Left Eye Distance:   Bilateral Distance:    Right Eye Near:   Left Eye Near:    Bilateral Near:     Physical Exam Vitals and nursing note reviewed.  Constitutional:      Appearance: Normal appearance. She is not ill-appearing.  HENT:     Head: Atraumatic.  Eyes:     Extraocular Movements: Extraocular movements intact.     Conjunctiva/sclera: Conjunctivae normal.  Cardiovascular:     Rate and Rhythm: Normal rate and regular rhythm.     Heart sounds: Normal heart sounds.  Pulmonary:     Effort: Pulmonary effort is normal.     Breath sounds: Normal breath sounds.  Musculoskeletal:        General: Swelling and tenderness present. Normal range of motion.     Cervical back: Normal range of motion and neck supple.     Comments: Erythema, edema, tenderness to palpation to the left distal ring finger  Skin:    General: Skin is warm and dry.     Findings: Erythema present.  Neurological:     Mental Status: She is alert and oriented  to person, place, and time.     Comments: Left upper extremity neurovascularly intact  Psychiatric:        Mood and Affect: Mood normal.        Thought Content: Thought content normal.        Judgment: Judgment normal.      UC Treatments / Results  Labs (all labs ordered are listed, but only abnormal results are displayed) Labs Reviewed - No data to display  EKG   Radiology No results found.  Procedures Procedures (including critical care  time)  Medications Ordered in UC Medications - No data to display  Initial Impression / Assessment and Plan / UC Course  I have reviewed the triage vital signs and the nursing notes.  Pertinent labs & imaging results that were available during my care of the patient were reviewed by me and considered in my medical decision making (see chart for details).     Treat for paronychia with clindamycin, Epsom salt soaks, elevation, ice, over-the-counter pain relievers as needed.  Return for worsening symptoms.  Final Clinical Impressions(s) / UC Diagnoses   Final diagnoses:  Paronychia, finger, left   Discharge Instructions   None    ED Prescriptions     Medication Sig Dispense Auth. Provider   clindamycin (CLEOCIN) 300 MG capsule Take 1 capsule (300 mg total) by mouth 2 (two) times daily. 14 capsule Particia Nearing, [place]      PDMP not reviewed this encounter.   Particia Nearing, [place] 02/24/24 1046

## 2024-05-15 ENCOUNTER — Other Ambulatory Visit (HOSPITAL_COMMUNITY): Payer: Self-pay | Admitting: Family Medicine

## 2024-05-15 DIAGNOSIS — I340 Nonrheumatic mitral (valve) insufficiency: Secondary | ICD-10-CM

## 2024-06-05 ENCOUNTER — Ambulatory Visit
Admission: EM | Admit: 2024-06-05 | Discharge: 2024-06-05 | Disposition: A | Discharge status: Home / Self Care | Attending: Internal Medicine | Admitting: Internal Medicine

## 2024-06-05 DIAGNOSIS — Z113 Encounter for screening for infections with a predominantly sexual mode of transmission: Secondary | ICD-10-CM | POA: Diagnosis present

## 2024-06-05 DIAGNOSIS — Z9189 Other specified personal risk factors, not elsewhere classified: Secondary | ICD-10-CM | POA: Insufficient documentation

## 2024-06-05 DIAGNOSIS — N941 Unspecified dyspareunia: Secondary | ICD-10-CM | POA: Diagnosis not present

## 2024-06-05 DIAGNOSIS — N921 Excessive and frequent menstruation with irregular cycle: Secondary | ICD-10-CM | POA: Diagnosis not present

## 2024-06-05 LAB — POCT URINALYSIS DIP (MANUAL ENTRY)
Bilirubin, UA: NEGATIVE
Blood, UA: NEGATIVE
Glucose, UA: NEGATIVE mg/dL
Ketones, POC UA: NEGATIVE mg/dL
Leukocytes, UA: NEGATIVE
Nitrite, UA: NEGATIVE
Protein Ur, POC: NEGATIVE mg/dL
Spec Grav, UA: 1.025 (ref 1.010–1.025)
Urobilinogen, UA: 0.2 U/dL
pH, UA: 6 (ref 5.0–8.0)

## 2024-06-05 LAB — POCT URINE PREGNANCY: Preg Test, Ur: NEGATIVE

## 2024-06-05 MED ORDER — ONDANSETRON 4 MG PO TBDP: 4.0000 mg | ORAL_TABLET | Freq: Three times a day (TID) | ORAL | 0 refills | Status: AC | PRN

## 2024-06-05 NOTE — Discharge Instructions (Addendum)
 Blood work is pending and STD testing is pending as well.  Staff will call if abnormal.  I suspect that your symptoms are due to a vaginal infection, however I am unsure which 1.  We will wait to treat when your swab comes back.  Continue taking your iron supplements.   Zofran  4mg  every 8 hours as needed for nausea/vomiting.   Pelvic exam looks good today, no concerning signs on exam.  Follow-up with OB/GYN as scheduled.  If you develop any new or worsening symptoms or if your symptoms do not start to improve, please return here or follow-up with your primary care provider. If your symptoms are severe, please go to the emergency room.

## 2024-06-05 NOTE — ED Provider Notes (Signed)
 [facility]    CSN: [id number] Arrival date & time: 06/05/24  8147      History   Chief Complaint No chief complaint on file.   HPI Patricia [name] is a 33 y.o. female.   Patient presents to urgent care for evaluation of vaginal discharge and vaginal odor that started yesterday after she finished her menstrual cycle as well as frequent menstrual cycles and irregular bleeding for the last 6 weeks.  She additionally reports dyspareunia and abdominal cramping after intercourse.   She has had a menstrual cycle every 2 weeks for the last 6 weeks.  Her last menstrual started on May 30, 2024 and ended yesterday on June 04, 2024. After her menstrual cycle ended yesterday, she noticed dull orange vaginal discharge and vaginal odor.  States her husband is an active addiction and she is unsure of any sexual activity that he may be participating in outside of her marriage, therefore she is concerned she may have an STD as well.  Last STD testing was 6 months ago.  She takes Truvada (PrEP) due to risk for HIV exposure through her husband. She additionally reports intermittent dysuria and pain to the external vaginal region with urination.  She is unsure if she has a vaginal rash. History of PCOS, states she does not usually have heavy bleeding. History of iron deficiency anemia, takes iron supplements daily. Denies recent fever, chills, nausea, vomiting, diarrhea, blood/mucus in the stools, weight loss without trying, flank pain, headache, and dizziness. She does not use contraception and has not attempted use of any OTC medications PTA for symptoms.      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 05/28/2020   Pap +HPV other and [facility], will need colpo per ASCCP guidelines,immediate  risk for CIN 3+ 12.6%    Asthma    Cough 08/01/2015   Depression    Fatty liver    Flexural atopic dermatitis  05/28/2022   GERD (gastroesophageal reflux disease)    HSIL (high grade squamous intraepithelial lesion) on Pap smear of cervix 06/04/2022   06/04/22 colpo per ASCCP guidelines, immediate risk is 23 %   Migraine    Migraines    Nausea and vomiting during pregnancy prior to [redacted] weeks gestation 08/01/2015   Nausea vomiting and diarrhea    Obesity    PCO (polycystic ovaries) 03/08/2014   Polycystic disease, ovaries    Pregnancy induced hypertension    Pregnant 05/27/2015   URI (upper respiratory infection) 08/01/2015   URI (upper respiratory infection) 08/01/2015    Patient Active Problem List   Diagnosis Date Noted   HSIL (high grade squamous intraepithelial lesion) on Pap smear of cervix 06/04/2022   ASCUS with positive high risk HPV cervical 05/31/2022   Urinary incontinence 05/31/2022   Routine general medical examination at a health care facility 05/31/2022   Mixed stress and urge urinary incontinence 05/31/2022   Mild persistent asthma, uncomplicated 05/28/2022   Flexural atopic dermatitis 05/28/2022   Pap smear of cervix with ASCUS, cannot exclude HGSIL 05/26/2021   Hip pain, right 05/26/2021   Family planning 05/26/2021   RUQ pain 04/22/2021   Esophagitis 04/22/2021   Pelvic pain 11/28/2020   Hypertension 07/15/2020   Abnormal Papanicolaou smear of cervix with positive human papilloma virus (HPV) test 05/28/2020   Nexplanon removal 05/20/2020   Encounter for gynecological examination with Papanicolaou smear of cervix 05/20/2020   Depression 05/20/2020  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   Fatty liver 11/29/2019   Diarrhea 09/27/2019   Elevated LFTs 06/17/2017   Calculus of gallbladder with acute cholecystitis without obstruction    Nausea vomiting and diarrhea    RUQ abdominal pain    Transaminitis    Biliary colic    Gall stones, common bile duct    Abdominal pain 06/04/2017   Chronic hypertension complicating or reason for care during  childbirth 04/13/2017   Chronic hypertension in pregnancy 04/13/2017   Chronic hypertension affecting pregnancy 11/08/2016   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   Supervision of high risk pregnancy, antepartum 09/27/2016   History of gestational hypertension 09/27/2016   History of shoulder dystocia in prior pregnancy, currently pregnant 09/27/2016   URI (upper respiratory infection) 08/01/2015   Persistent cough 08/01/2015   Obesity, Class III, BMI 40-49.9 (morbid obesity) 09/23/2014   Headache 09/23/2014   PCO (polycystic ovaries) 03/08/2014    Past Surgical History:  Procedure Laterality Date   BIOPSY  01/20/2021   Procedure: BIOPSY;  Surgeon: Cindie Carlin POUR, DO;  [facility];  Service: Endoscopy;;   CESAREAN SECTION  10/11/2023   CHOLECYSTECTOMY N/A 06/08/2017   Procedure: LAPAROSCOPIC CHOLECYSTECTOMY;  Surgeon: Mavis Anes, MD;  Location: [facility];  Service: General;  Laterality: N/A;   ESOPHAGOGASTRODUODENOSCOPY (EGD) WITH PROPOFOL  N/A 01/20/2021   LA Grade A reflux esophagitis, gastritis s/p biopsy, normal duodenum s/p biopsy. Negative H.pylori. negative celiac sprue.    LEEP N/A 07/14/2022   Procedure: LOOP ELECTROSURGICAL EXCISION PROCEDURE (LEEP);  Surgeon: Ozan, Jennifer, DO;  Location: [facility];  Service: Gynecology;  Laterality: N/A;   sleeve gastrectomy     Dec 2022   TIF  08/2021   TONSILLECTOMY     WISDOM TOOTH EXTRACTION      OB History     Gravida  5   Para  3   Term  3   Preterm      AB  1   Living  4      SAB  1   IAB      Ectopic      Multiple  1   Live Births  4            Home Medications    Prior to Admission medications   Medication Sig Start Date End Date Taking? Authorizing Provider  albuterol  (PROVENTIL  HFA) 108 (90 Base) MCG/ACT inhaler Inhale 2 puffs into the lungs every 4 (four) hours as needed for wheezing or shortness of breath. 06/27/23   Iva Marty Saltness, MD  beclomethasone (QVAR   REDIHALER) 80 MCG/ACT inhaler Use two puffs twice daily for one to two weeks during flares. 07/20/23   Iva Marty Saltness, MD  Calcium Carb-Cholecalciferol (CALCIUM 500 + D PO) Take 2 tablets by mouth 2 (two) times daily.    [provider]  Cetirizine HCl 10 MG TBDP Take 10 mg by mouth daily.    [provider]  clindamycin  (CLEOCIN ) 300 MG capsule Take 1 capsule (300 mg total) by mouth 2 (two) times daily. 02/24/24   Stuart Vernell Norris, PA-C  EPINEPHrine  0.3 mg/0.3 mL IJ SOAJ injection into the thigh. 06/22/23   [provider]  ferrous sulfate  324 MG TBEC Take 324 mg by mouth.    [provider]  fluticasone  (FLONASE ) 50 MCG/ACT nasal spray 1-2 sprays each nostril daily as needed for stuffy nose. 02/17/22   Cheryl Reusing, FNP  fluticasone  (FLOVENT  HFA) 110 MCG/ACT  inhaler Use 2 puffs 2 times daily for 1-2 weeks at a time until symptoms return to normal. 07/04/23   Iva Marty Saltness, MD  hydrocortisone  2.5 % ointment APPLY  OINTMENT EXTERNALLY TO AFFECTED AREA TWICE DAILY AS NEEDED TO  RED  ITCHY  AREAS 04/06/23   Cheryl Reusing, FNP  NIFEdipine (PROCARDIA XL/NIFEDICAL XL) 60 MG 24 hr tablet Take 60 mg by mouth daily. 12/06/23   [provider]  nystatin -triamcinolone  ointment (MYCOLOG) Apply 1 Application topically 2 (two) times daily. 12/12/23   Cresenzo-Dishmon, Cathlean, CNM  Prenatal Vit-Fe Fumarate-FA (PRENATAL MULTIVITAMIN) TABS tablet Take 1 tablet by mouth daily at 12 noon.    [provider]  sertraline  (ZOLOFT ) 50 MG tablet Take 1 tablet by mouth daily. 12/01/23 11/30/24  [provider]    Family History Family History  Problem Relation Age of Onset   Cancer Paternal Grandfather        prostate   Stroke Paternal Grandfather    Stroke Paternal Grandmother    Heart disease Maternal Grandmother    Cancer Maternal Grandfather        prostate   Atrial fibrillation Father    Healthy Mother    Hypertension Brother     Migraines Sister    Asthma Son    Asthma Son    Klinefelter's syndrome Son    Colon cancer Neg Hx    Allergic rhinitis Neg Hx    Angioedema Neg Hx    Atopy Neg Hx    Eczema Neg Hx    Urticaria Neg Hx    Immunodeficiency Neg Hx     Social History Social History   Tobacco Use   Smoking status: Never   Smokeless tobacco: Never  Vaping Use   Vaping status: Never Used  Substance Use Topics   Alcohol use: Not Currently    Comment: occassional   Drug use: No     Allergies   Peanut -containing drug products, Propranolol, Cephalexin, Imitrex [sumatriptan], and Nickel   Review of Systems Review of Systems Per HPI  Physical Exam Triage Vital Signs ED Triage Vitals  Encounter Vitals Group     BP 06/05/24 1903 137/87     Girls Systolic BP Percentile --      Girls Diastolic BP Percentile --      Boys Systolic BP Percentile --      Boys Diastolic BP Percentile --      Pulse Rate 06/05/24 1903 64     Resp 06/05/24 1903 16     Temp 06/05/24 1903 99 F (37.2 C)     Temp Source 06/05/24 1903 Oral     SpO2 06/05/24 1903 98 %     Weight --      Height --      Head Circumference --      Peak Flow --      Pain Score 06/05/24 1906 7     Pain Loc --      Pain Education --      Exclude from Growth Chart --    No data found.  Updated Vital Signs BP 137/87 (BP Location: Right Arm)   Pulse 64   Temp 99 F (37.2 C) (Oral)   Resp 16   LMP 05/30/2024   SpO2 98%   Visual Acuity Right Eye Distance:   Left Eye Distance:   Bilateral Distance:    Right Eye Near:   Left Eye Near:    Bilateral Near:     Physical Exam  Vitals and nursing note reviewed.  Constitutional:      Appearance: She is not ill-appearing or toxic-appearing.  HENT:     Head: Normocephalic and atraumatic.     Right Ear: Hearing and external ear normal.     Left Ear: Hearing and external ear normal.     Nose: Nose normal.     Mouth/Throat:     Lips: Pink.  Eyes:     General: Lids are normal.  Vision grossly intact. Gaze aligned appropriately.     Extraocular Movements: Extraocular movements intact.     Conjunctiva/sclera: Conjunctivae normal.  Pulmonary:     Effort: Pulmonary effort is normal.  Musculoskeletal:     Cervical back: Neck supple.  Skin:    General: Skin is warm and dry.     Capillary Refill: Capillary refill takes less than 2 seconds.     Findings: No rash.  Neurological:     General: No focal deficit present.     Mental Status: She is alert and oriented to person, place, and time. Mental status is at baseline.     Cranial Nerves: No dysarthria or facial asymmetry.  Psychiatric:        Mood and Affect: Mood normal.        Speech: Speech normal.        Behavior: Behavior normal.        Thought Content: Thought content normal.        Judgment: Judgment normal.      UC Treatments / Results  Labs (all labs ordered are listed, but only abnormal results are displayed) Labs Reviewed  POCT URINALYSIS DIP (MANUAL ENTRY) - Abnormal; Notable for the following components:      Result Value   Color, UA light yellow (*)    All other components within normal limits  POCT URINE PREGNANCY - Normal    EKG   Radiology No results found.  Procedures Procedures (including critical care time)  Medications Ordered in UC Medications - No data to display  Initial Impression / Assessment and Plan / UC Course  I have reviewed the triage vital signs and the nursing notes.  Pertinent labs & imaging results that were available during my care of the patient were reviewed by me and considered in my medical decision making (see chart for details).     *** Final Clinical Impressions(s) / UC Diagnoses   Final diagnoses:  None   Discharge Instructions   None    ED Prescriptions   None    PDMP not reviewed this encounter.

## 2024-06-05 NOTE — ED Triage Notes (Signed)
 Pt reports she has been having heavy vaginal bleeding, hips hurts, low back pain,  nausea, vaginal burning, odor, and uncomfortable intercourse-heavy cramping after intercourse x 2 weeks  for 6 weeks now

## 2024-06-06 ENCOUNTER — Ambulatory Visit (HOSPITAL_COMMUNITY): Payer: Self-pay

## 2024-06-06 LAB — CERVICOVAGINAL ANCILLARY ONLY
Bacterial Vaginitis (gardnerella): POSITIVE — AB
Candida Glabrata: NEGATIVE
Candida Vaginitis: NEGATIVE
Chlamydia: NEGATIVE
Comment: NEGATIVE
Comment: NEGATIVE
Comment: NEGATIVE
Comment: NEGATIVE
Comment: NEGATIVE
Comment: NORMAL
Neisseria Gonorrhea: NEGATIVE
Trichomonas: NEGATIVE

## 2024-06-06 LAB — RPR: RPR Ser Ql: NONREACTIVE

## 2024-06-06 LAB — CBC
Hematocrit: 37.1 % (ref 34.0–46.6)
Hemoglobin: 11.7 g/dL (ref 11.1–15.9)
MCH: 26.8 pg (ref 26.6–33.0)
MCHC: 31.5 g/dL (ref 31.5–35.7)
MCV: 85 fL (ref 79–97)
Platelets: 325 x10E3/uL (ref 150–450)
RBC: 4.36 x10E6/uL (ref 3.77–5.28)
RDW: 13.6 % (ref 11.7–15.4)
WBC: 6.8 x10E3/uL (ref 3.4–10.8)

## 2024-06-06 LAB — HIV ANTIBODY (ROUTINE TESTING W REFLEX): HIV Screen 4th Generation wRfx: NONREACTIVE

## 2024-06-07 ENCOUNTER — Ambulatory Visit (HOSPITAL_COMMUNITY)
Admission: RE | Admit: 2024-06-07 | Discharge: 2024-06-07 | Disposition: A | Discharge status: Home / Self Care | Source: Ambulatory Visit | Attending: Family Medicine | Admitting: Family Medicine

## 2024-06-07 DIAGNOSIS — I340 Nonrheumatic mitral (valve) insufficiency: Secondary | ICD-10-CM | POA: Diagnosis not present

## 2024-06-07 LAB — ECHOCARDIOGRAM COMPLETE
AR max vel: 2.95 cm2
AV Area VTI: 3.11 cm2
AV Area mean vel: 2.74 cm2
AV Mean grad: 2.9 mmHg
AV Peak grad: 5.7 mmHg
Ao pk vel: 1.19 m/s
Area-P 1/2: 3.72 cm2
S' Lateral: 2.9 cm

## 2024-06-07 NOTE — Progress Notes (Signed)
*  PRELIMINARY RESULTS* Echocardiogram 2D Echocardiogram has been performed.  Patricia [name] 06/07/2024, 9:35 AM

## 2024-06-08 MED ORDER — METRONIDAZOLE 0.75 % VA GEL: 1.0000 | Freq: Every day | VAGINAL | 0 refills | Status: AC

## 2024-06-08 MED ORDER — METRONIDAZOLE 500 MG PO TABS: 500.0000 mg | ORAL_TABLET | Freq: Two times a day (BID) | ORAL | 0 refills | Status: DC

## 2024-06-15 ENCOUNTER — Other Ambulatory Visit (HOSPITAL_COMMUNITY): Payer: Self-pay | Admitting: Family Medicine

## 2024-06-15 DIAGNOSIS — R42 Dizziness and giddiness: Secondary | ICD-10-CM

## 2024-06-26 ENCOUNTER — Ambulatory Visit (HOSPITAL_COMMUNITY)
Admission: RE | Admit: 2024-06-26 | Discharge: 2024-06-26 | Disposition: A | Discharge status: Home / Self Care | Source: Ambulatory Visit | Attending: Family Medicine | Admitting: Family Medicine

## 2024-06-26 DIAGNOSIS — R42 Dizziness and giddiness: Secondary | ICD-10-CM | POA: Insufficient documentation

## 2024-07-02 ENCOUNTER — Other Ambulatory Visit (HOSPITAL_COMMUNITY)
Admission: RE | Admit: 2024-07-02 | Discharge: 2024-07-02 | Disposition: A | Discharge status: Home / Self Care | Source: Ambulatory Visit | Attending: Obstetrics & Gynecology | Admitting: Obstetrics & Gynecology

## 2024-07-02 ENCOUNTER — Ambulatory Visit: Admitting: Obstetrics & Gynecology

## 2024-07-02 ENCOUNTER — Encounter: Payer: Self-pay | Admitting: Obstetrics & Gynecology

## 2024-07-02 VITALS — BP 138/86 | HR 74 | Ht 64.0 in | Wt 239.4 lb

## 2024-07-02 DIAGNOSIS — Z124 Encounter for screening for malignant neoplasm of cervix: Secondary | ICD-10-CM | POA: Diagnosis present

## 2024-07-02 DIAGNOSIS — N898 Other specified noninflammatory disorders of vagina: Secondary | ICD-10-CM | POA: Diagnosis not present

## 2024-07-02 DIAGNOSIS — N921 Excessive and frequent menstruation with irregular cycle: Secondary | ICD-10-CM | POA: Diagnosis not present

## 2024-07-02 DIAGNOSIS — Z9889 Other specified postprocedural states: Secondary | ICD-10-CM

## 2024-07-02 NOTE — Progress Notes (Signed)
 GYN VISIT Patient name: Patricia [name] MRN [id number]  Date of birth: [birth year] Chief Complaint:   Gynecologic Exam  History of Present Illness:   Patricia [name] is a 33 y.o. [id number] female being seen today for the following concerns:  -Cervical dysplasia: -s/p LEEP 2023, co testing 2024 negative, repeat cotesting today.     Notes burning with urination as well as itching- going on for the past several weeks.  Tried Metrogel  and only some improvement.  Previously had an odor, but since has resolved.  Menses are every 2 weeks- sometimes 5 days other up to 7 days.  Typically 3 heavy days changing every 1-2 hours.  Of note patient has also been seen at [facility] and is scheduled for a bilateral salpingectomy the end of this month for contraception  Patient's last menstrual period was 06/29/2024.   Review of Systems:   Pertinent items are noted in HPI Denies fever/chills, dizziness, headaches, visual disturbances, fatigue, shortness of breath, chest pain, abdominal pain, vomiting  Pertinent History Reviewed:   Past Surgical History:  Procedure Laterality Date   BIOPSY  01/20/2021   Procedure: BIOPSY;  Surgeon: Cindie Carlin POUR, DO;  Location: [facility];  Service: Endoscopy;;   CESAREAN SECTION  10/11/2023   CHOLECYSTECTOMY N/A 06/08/2017   Procedure: LAPAROSCOPIC CHOLECYSTECTOMY;  Surgeon: Mavis Anes, MD;  Location: [facility] ORS;  Service: General;  Laterality: N/A;   ESOPHAGOGASTRODUODENOSCOPY (EGD) WITH PROPOFOL  N/A 01/20/2021   LA Grade A reflux esophagitis, gastritis s/p biopsy, normal duodenum s/p biopsy. Negative H.pylori. negative celiac sprue.    LEEP N/A 07/14/2022   Procedure: LOOP ELECTROSURGICAL EXCISION PROCEDURE (LEEP);  Surgeon: Shirely Toren, DO;  Location: [facility];  Service: Gynecology;  Laterality: N/A;   sleeve gastrectomy     Dec 2022   TIF  08/2021   TONSILLECTOMY     WISDOM TOOTH EXTRACTION      Past Medical History:  Diagnosis Date   Abnormal  Papanicolaou smear of cervix with positive human papilloma virus (HPV) test 05/28/2020   Pap +HPV other and [facility], will need colpo per ASCCP guidelines,immediate  risk for CIN 3+ 12.6%    Asthma    Cough 08/01/2015   Depression    Fatty liver    Flexural atopic dermatitis 05/28/2022   GERD (gastroesophageal reflux disease)    HSIL (high grade squamous intraepithelial lesion) on Pap smear of cervix 06/04/2022   06/04/22 colpo per ASCCP guidelines, immediate risk is 23 %   Migraine    Migraines    Nausea and vomiting during pregnancy prior to [redacted] weeks gestation 08/01/2015   Nausea vomiting and diarrhea    Obesity    PCO (polycystic ovaries) 03/08/2014   Polycystic disease, ovaries    Pregnancy induced hypertension    Pregnant 05/27/2015   URI (upper respiratory infection) 08/01/2015   URI (upper respiratory infection) 08/01/2015   Reviewed problem list, medications and allergies. Physical Assessment:   Vitals:   07/02/24 0840 07/02/24 0858  BP: (!) 130/90 138/86  Pulse: 67 74  Weight: 239 lb 6.4 oz (108.6 kg)   Height: 5' 4 (1.626 m)   Body mass index is 41.09 kg/m.       Physical Examination:   General appearance: alert, well appearing, and in no distress  Psych: mood appropriate, normal affect  Skin: warm & dry   Cardiovascular: normal heart rate noted  Respiratory: normal respiratory effort, no distress  Abdomen: soft, non-tender   Pelvic: VULVA: normal appearing vulva  with no masses, tenderness or lesions, VAGINA: normal appearing vagina with normal color, minimal clumpy red menses noted, no abnormal discharge though difficult to evaluate due to menses CERVIX: normal appearing cervix without discharge or lesions  Extremities: no edema   Chaperone: Alan Fischer    Assessment & Plan:  1) AUB/HMB with irregular menses - Briefly discussed management options including pills, Depo, IUD - It seems as though previously she did have some improvement with pills in the past.   Discussed that in light of upcoming surgery she might consider placement of IUD at the time of her salpingectomy.  Encouraged patient to speak with her other provider regarding this management  2) Cervical dysplasia/preventative screen - Pap due today, if cotesting negative will plan to return to her routine screening  3) Vaginal irritation - Vaginitis panel obtained today, further management pending results   Return in about 1 year (around 07/02/2025) for Annual.   Ean Gettel, DO Attending Obstetrician & Gynecologist, [facility] for [facility], [facility]

## 2024-07-03 LAB — CERVICOVAGINAL ANCILLARY ONLY
Bacterial Vaginitis (gardnerella): POSITIVE — AB
Candida Glabrata: NEGATIVE
Candida Vaginitis: NEGATIVE
Comment: NEGATIVE
Comment: NEGATIVE
Comment: NEGATIVE

## 2024-07-04 ENCOUNTER — Ambulatory Visit: Payer: Self-pay | Admitting: Obstetrics & Gynecology

## 2024-07-04 DIAGNOSIS — B9689 Other specified bacterial agents as the cause of diseases classified elsewhere: Secondary | ICD-10-CM

## 2024-07-04 MED ORDER — METRONIDAZOLE 500 MG PO TABS: 500.0000 mg | ORAL_TABLET | Freq: Two times a day (BID) | ORAL | 0 refills | Status: AC

## 2024-07-06 LAB — CYTOLOGY - PAP
Adequacy: ABSENT
Comment: NEGATIVE
Diagnosis: REACTIVE
High risk HPV: NEGATIVE

## 2024-07-16 ENCOUNTER — Ambulatory Visit (INDEPENDENT_AMBULATORY_CARE_PROVIDER_SITE_OTHER): Admitting: Gastroenterology

## 2024-07-16 ENCOUNTER — Encounter: Payer: Self-pay | Admitting: Gastroenterology

## 2024-07-16 VITALS — BP 134/84 | HR 72 | Temp 97.9°F | Ht 64.0 in | Wt 243.0 lb

## 2024-07-16 DIAGNOSIS — K529 Noninfective gastroenteritis and colitis, unspecified: Secondary | ICD-10-CM | POA: Diagnosis not present

## 2024-07-16 DIAGNOSIS — K219 Gastro-esophageal reflux disease without esophagitis: Secondary | ICD-10-CM | POA: Diagnosis not present

## 2024-07-16 DIAGNOSIS — R197 Diarrhea, unspecified: Secondary | ICD-10-CM

## 2024-07-16 DIAGNOSIS — R053 Chronic cough: Secondary | ICD-10-CM

## 2024-07-16 MED ORDER — PANTOPRAZOLE SODIUM 40 MG PO TBEC: 40.0000 mg | DELAYED_RELEASE_TABLET | Freq: Every day | ORAL | 0 refills | Status: AC

## 2024-07-16 NOTE — Progress Notes (Signed)
 GI Office Note    Referring Provider: Shona Norleen PEDLAR, MD Primary Care Physician:  Shona Norleen PEDLAR, MD  Primary Gastroenterologist: Carlin POUR. Cindie, DO   Chief Complaint   Chief Complaint  Patient presents with   Diarrhea    States that she has issues with diarrhea after she eats, has been going in since around Feb    History of Present Illness   Patricia [name] is a 33 y.o. female presenting today for postprandial urgent stools. Last seen 03/2022 for chronic GERD, chronic diarrhea on questran  historically. Previous esophageal manometry showed potential outflow obstruction, normal peristalsis, pH testing positive for acid reflux. Underwent fundoplication (TIF). Subsequently has had gastric sleeve.  Discussed the use of AI scribe software for clinical note transcription with the patient, who gave verbal consent to proceed.   She has experienced recurrent diarrhea since February, initially triggered by greasy, fatty foods, but now occurring with any food, including fruits and vegetables. She experiences urgency to defecate approximately 15-20 minutes after starting a meal and avoids eating outside her home unless she trusts the cleanliness of the restroom. She has 4-5 stools per day, which are mostly watery with occasional 'chunky pieces'. No nocturnal symptoms are present, and her stools are rarely formed. She did not have a change in medications but did have to stop dairy and eggs for breastfeeding, and she is currently on antibiotics (metronidazole ) for BV. Denies ill contacts.  In July, she experienced abdominal cramps along with 'orange foamy diarrhea' and occasionally notices oil droplets in the toilet after consuming fatty foods. No blood or black stools are present. Her weight has stabilized postpartum, and she has not experienced ongoing weight loss.  She reports a return of heartburn and a cough, which she attributes to reflux. She had a TIF procedure in 2023 and is not currently on  any reflux medications. She experiences a daily cough without a burning sensation and uses Mucinex and cough drops with little relief. She would like to try reflux medication. No dysphagia.   She experienced a syncopal episode while in her car in her driveway. She had a head CT and an upcoming cardiology evaluation next month. She felt the episode coming on a few hours prior to syncopal episodes. She has been experiencing some lightheadedness. her older children.      Prior Data   Echocardiogram July 2025: IMPRESSIONS   1. Left ventricular ejection fraction, by estimation, is 60 to 65%. The  left ventricle has normal function. The left ventricle has no regional  wall motion abnormalities. Left ventricular diastolic parameters were  normal.   2. Right ventricular systolic function is normal. The right ventricular  size is normal.   3. Left atrial size was mildly dilated.   4. The mitral valve is normal in structure. No evidence of mitral valve  regurgitation. No evidence of mitral stenosis.   5. The tricuspid valve is abnormal.   6. The aortic valve is tricuspid. Aortic valve regurgitation is not  visualized.   7. The inferior vena cava is normal in size with greater than 50%  respiratory variability, suggesting right atrial pressure of 3 mmHg.   CT A/P with contrast 10/2022: IMPRESSION: 1. No acute findings in the abdomen pelvis. 2. Post bariatric surgery without complication. 3. Normal appendix.  EGD 01/2021: -LA Grade A Esophagitis -gastritis s/p bx, mild chronic gastritis no h.pylori -bx from middle third of esophagus, neg -duodenal bx negative   Medications   Current  Outpatient Medications  Medication Sig Dispense Refill   albuterol  (PROVENTIL  HFA) 108 (90 Base) MCG/ACT inhaler Inhale 2 puffs into the lungs every 4 (four) hours as needed for wheezing or shortness of breath. 1 each 5   beclomethasone (QVAR  REDIHALER) 80 MCG/ACT inhaler Use two puffs twice daily for one  to two weeks during flares. 1 each 5   Calcium Carb-Cholecalciferol (CALCIUM 500 + D PO) Take 2 tablets by mouth 2 (two) times daily.     Cetirizine HCl 10 MG TBDP Take 10 mg by mouth daily.     EPINEPHrine  0.3 mg/0.3 mL IJ SOAJ injection into the thigh.     ferrous sulfate  324 MG TBEC Take 324 mg by mouth.     fluticasone  (FLONASE ) 50 MCG/ACT nasal spray 1-2 sprays each nostril daily as needed for stuffy nose. 16 g 5   fluticasone  (FLOVENT  HFA) 110 MCG/ACT inhaler Use 2 puffs 2 times daily for 1-2 weeks at a time until symptoms return to normal. 12 g 5   hydrocortisone  2.5 % ointment APPLY  OINTMENT EXTERNALLY TO AFFECTED AREA TWICE DAILY AS NEEDED TO  RED  ITCHY  AREAS 29 g 5   lisdexamfetamine (VYVANSE) 50 MG capsule Take 50 mg by mouth every morning.     ondansetron  (ZOFRAN -ODT) 4 MG disintegrating tablet Take 1 tablet (4 mg total) by mouth every 8 (eight) hours as needed for nausea or vomiting. 20 tablet 0   sertraline  (ZOLOFT ) 50 MG tablet Take 1 tablet by mouth daily.     TRUVADA 200-300 MG tablet Take 1 tablet every day by oral route as directed for 30 days.     No current facility-administered medications for this visit.    Allergies   Allergies as of 07/16/2024 - Review Complete 07/16/2024  Allergen Reaction Noted   Peanut -containing drug products Anaphylaxis and Hives 04/27/2012   Propranolol Shortness Of Breath 09/23/2014   Cephalexin Hives 12/22/2011   Imitrex [sumatriptan] Hives 10/12/2013   Nickel Rash 10/29/2021        Review of Systems   General: Negative for anorexia, unintentional weight loss, fever, chills, fatigue, weakness. ENT: Negative for hoarseness, difficulty swallowing , nasal congestion. CV: Negative for chest pain, angina, palpitations, dyspnea on exertion, peripheral edema.  Respiratory: Negative for dyspnea at rest, dyspnea on exertion, +cough, sputum, wheezing.  GI: See history of present illness. GU:  Negative for dysuria, hematuria, urinary  incontinence, urinary frequency, nocturnal urination.  Endo: Negative for unusual weight change.     Physical Exam   BP 134/84 (BP Location: Right Arm, Patient Position: Sitting, Cuff Size: Large)   Pulse 72   Temp 97.9 F (36.6 C) (Oral)   Ht 5' 4 (1.626 m)   Wt 243 lb (110.2 kg)   LMP 06/29/2024 (Exact Date)   SpO2 99%   Breastfeeding No   BMI 41.71 kg/m    General: Well-nourished, well-developed in no acute distress.  Eyes: No icterus. Mouth: Oropharyngeal mucosa moist and pink   Lungs: Clear to auscultation bilaterally.  Heart: Regular rate and rhythm, no murmurs rubs or gallops.  Abdomen: Bowel sounds are normal, nontender, nondistended, no hepatosplenomegaly or masses,  no abdominal bruits or hernia , no rebound or guarding.  Rectal: not performed Extremities: No lower extremity edema. No clubbing or deformities. Neuro: Alert and oriented x 4   Skin: Warm and dry, no jaundice.   Psych: Alert and cooperative, normal mood and affect.  Labs   November 2024: Alkaline phosphatase 115, total bilirubin 0.2,  AST 22, ALT 8, albumin 2.6, creatinine 0.62, platelets 166  Labs 05/2024: TIBC 288, iron 50, iron sat 17%, ferritin 100, white blood cell count 6.2, hemoglobin 13.8, platelets 389, glucose 88, creatinine 0.72, sodium 139, albumin 4, total bilirubin 0.2, alk phos 64, AST 20, ALT 18, triglycerides 86, total cholesterol 160, LDL 88, HDL 56, A1c 5.6  Lab Results  Component Value Date   WBC 6.8 06/05/2024   HGB 11.7 06/05/2024   HCT 37.1 06/05/2024   MCV 85 06/05/2024   PLT 325 06/05/2024    Imaging Studies   CT HEAD WO CONTRAST ( ) Result Date: 07/05/2024 CLINICAL DATA:  Dizziness and giddiness. EXAM: CT HEAD WITHOUT CONTRAST TECHNIQUE: Contiguous axial images were obtained from the base of the skull through the vertex without intravenous contrast. RADIATION DOSE REDUCTION: This exam was performed according to the departmental dose-optimization program which includes  automated exposure control, adjustment of the mA and/or kV according to patient size and/or use of iterative reconstruction technique. COMPARISON:  None Available. FINDINGS: Brain: No evidence of acute infarction, hemorrhage, hydrocephalus, extra-axial collection or mass lesion/mass effect. Vascular: No hyperdense vessel. Skull: No acute fracture. Sinuses/Orbits: Clear sinuses.  No acute orbital findings. Other: No mastoid effusions. IMPRESSION: No evidence of acute intracranial abnormality. Electronically Signed   By: Gilmore GORMAN Molt M.D.   On: 07/05/2024 15:04    Assessment/Plan:        Chronic diarrhea Chronic diarrhea with steatorrhea since February, occurring 15-20 minutes postprandially. Stools are watery with occasional chunky pieces and fat, especially after fatty meals but happens every times she eats. No blood or melena. No nocturnal symptoms. Differential includes bile salt diarrhea, infectious etiology, pancreatic insufficiency, less likely celiac or inflammatory bowel disease.  She is open to colonoscopy if necessary to determine cause of her symptoms. - TSH, free T4, TTG IgA, IgA, CRP, sed rate, fecal elastase, fecal calprotectin, gi profile - Consider restarting Questran  powder if lab and stool tests are unremarkable. - Discuss potential colonoscopy if symptoms persist and lab results are inconclusive.  Gastroesophageal reflux disease with chronic cough Chronic cough associated with GERD, occurring daily without significant heartburn. Underwent TIF procedure in 2023. Did well until this year. No current medication for reflux. She is willing to try medication for symptom relief. - Pantoprazole  daily before breakfast. - Monitor response to pantoprazole  and reassess if symptoms persist.  Syncope Recent syncope with preceding symptoms of lightheadedness starting hours before the event. Scheduled to see a cardiologist next month for further evaluation. -will require cardiac clearance  prior to possible colonoscopy.       Sonny GORMAN. Ezzard, MHS, PA-C [facility]

## 2024-07-16 NOTE — Patient Instructions (Signed)
 Start pantoprazole  40mg  daily before breakfast. Should be ok for use during breastfeeding due to very limited excretion into breast milk.  Please complete labs and stool tests. We will be in touch with results as available.

## 2024-08-05 LAB — TISSUE TRANSGLUTAMINASE, IGA: Transglutaminase IgA: <2 U/mL (ref 0–3)

## 2024-08-05 LAB — IGA: IgA/Immunoglobulin A, Serum: 143 mg/dL (ref 87–352)

## 2024-08-05 LAB — TSH+FREE T4
Free T4: 1.21 ng/dL (ref 0.82–1.77)
TSH: 0.604 u[IU]/mL (ref 0.450–4.500)

## 2024-08-05 LAB — C-REACTIVE PROTEIN: CRP: 2 mg/L (ref 0–10)

## 2024-08-05 LAB — SEDIMENTATION RATE: Sed Rate: 21 mm/h (ref 0–32)

## 2024-08-11 ENCOUNTER — Ambulatory Visit: Payer: Self-pay | Admitting: Gastroenterology

## 2024-08-15 ENCOUNTER — Ambulatory Visit: Attending: Internal Medicine

## 2024-08-15 ENCOUNTER — Ambulatory Visit: Attending: Internal Medicine | Admitting: Internal Medicine

## 2024-08-15 ENCOUNTER — Encounter: Payer: Self-pay | Admitting: Internal Medicine

## 2024-08-15 ENCOUNTER — Other Ambulatory Visit: Payer: Self-pay | Admitting: Internal Medicine

## 2024-08-15 VITALS — BP 128/74 | HR 69 | Ht 64.0 in | Wt 238.4 lb

## 2024-08-15 DIAGNOSIS — R55 Syncope and collapse: Secondary | ICD-10-CM | POA: Diagnosis not present

## 2024-08-15 DIAGNOSIS — O10919 Unspecified pre-existing hypertension complicating pregnancy, unspecified trimester: Secondary | ICD-10-CM

## 2024-08-15 DIAGNOSIS — Z8759 Personal history of other complications of pregnancy, childbirth and the puerperium: Secondary | ICD-10-CM | POA: Diagnosis not present

## 2024-08-15 NOTE — Progress Notes (Signed)
 Cardiology Office Note  Date: 08/15/2024   ID: Patricia [name], DOB 05-Jun-1991, MRN [id number]  PCP:  Patricia Norleen PEDLAR, MD  Cardiologist:  None Electrophysiologist:  None   History of Present Illness: Patricia [name] is a 33 y.o. female  Referred to cardiology clinic for evaluation of syncope.  Patient had prodromal symptoms of dizziness for less than 30 minutes followed by loss of consciousness in June 2025.  She did not have any seizure-like activity during this time.  No postictal confusion noted.  She also had 2 other syncopal events in the spring 2024 and the fall 2023.  No triggering factors noted.  Drinks adequate water  and eats healthy meals.  No angina, DOE, palpitations.  She never had palpitations prior to syncope.  EKG today showed normal sinus rhythm, normal QTc interval and no evidence of preexcitation.  Echocardiogram reviewed from July 2025 that showed normal LVEF, no valvular heart disease.  No evidence of structural heart disease.  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 05/28/2020   Pap +HPV other and [facility], will need colpo per ASCCP guidelines,immediate  risk for CIN 3+ 12.6%    Asthma    Cough 08/01/2015   Depression    Fatty liver    Flexural atopic dermatitis 05/28/2022   GERD (gastroesophageal reflux disease)    HSIL (high grade squamous intraepithelial lesion) on Pap smear of cervix 06/04/2022   06/04/22 colpo per ASCCP guidelines, immediate risk is 23 %   Migraine    Migraines    Nausea and vomiting during pregnancy prior to [redacted] weeks gestation 08/01/2015   Nausea vomiting and diarrhea    Obesity    PCO (polycystic ovaries) 03/08/2014   Polycystic disease, ovaries    Pregnancy induced hypertension    Pregnant 05/27/2015   URI (upper respiratory infection) 08/01/2015   URI (upper respiratory infection) 08/01/2015    Past Surgical History:  Procedure Laterality Date    BIOPSY  01/20/2021   Procedure: BIOPSY;  Surgeon: Cindie Carlin POUR, DO;  [facility];  Service: Endoscopy;;   CESAREAN SECTION  10/11/2023   CHOLECYSTECTOMY N/A 06/08/2017   Procedure: LAPAROSCOPIC CHOLECYSTECTOMY;  Surgeon: Mavis Anes, MD;  Location: [facility];  Service: General;  Laterality: N/A;   ESOPHAGOGASTRODUODENOSCOPY (EGD) WITH PROPOFOL  N/A 01/20/2021   LA Grade A reflux esophagitis, gastritis s/p biopsy, normal duodenum s/p biopsy. Negative H.pylori. negative celiac sprue.    LEEP N/A 07/14/2022   Procedure: LOOP ELECTROSURGICAL EXCISION PROCEDURE (LEEP);  Surgeon: Ozan, Jennifer, DO;  Location: [facility];  Service: Gynecology;  Laterality: N/A;   sleeve gastrectomy     Dec 2022   TIF  08/2021   TONSILLECTOMY     WISDOM TOOTH EXTRACTION      Current Outpatient Medications  Medication Sig Dispense Refill   albuterol  (PROVENTIL  HFA) 108 (90 Base) MCG/ACT inhaler Inhale 2 puffs into the lungs every 4 (four) hours as needed for wheezing or shortness of breath. 1 each 5   beclomethasone (QVAR  REDIHALER) 80 MCG/ACT inhaler Use two puffs twice daily for one to two weeks during flares. 1 each 5   Calcium Carb-Cholecalciferol (CALCIUM 500 + D PO) Take 2 tablets by mouth 2 (two) times daily.     Cetirizine HCl 10 MG TBDP Take 10 mg by mouth daily.     EPINEPHrine  0.3 mg/0.3 mL IJ SOAJ injection into the thigh.     ferrous  sulfate 324 MG TBEC Take 324 mg by mouth.     fluticasone  (FLONASE ) 50 MCG/ACT nasal spray 1-2 sprays each nostril daily as needed for stuffy nose. 16 g 5   fluticasone  (FLOVENT  HFA) 110 MCG/ACT inhaler Use 2 puffs 2 times daily for 1-2 weeks at a time until symptoms return to normal. 12 g 5   hydrocortisone  2.5 % ointment APPLY  OINTMENT EXTERNALLY TO AFFECTED AREA TWICE DAILY AS NEEDED TO  RED  ITCHY  AREAS 29 g 5   lisdexamfetamine (VYVANSE) 50 MG capsule Take 50 mg by mouth every morning.     ondansetron  (ZOFRAN -ODT) 4 MG disintegrating tablet Take 1  tablet (4 mg total) by mouth every 8 (eight) hours as needed for nausea or vomiting. 20 tablet 0   pantoprazole  (PROTONIX ) 40 MG tablet Take 1 tablet (40 mg total) by mouth daily before breakfast. 90 tablet 0   sertraline  (ZOLOFT ) 50 MG tablet Take 1 tablet by mouth daily.     TRUVADA 200-300 MG tablet Take 1 tablet every day by oral route as directed for 30 days.     No current facility-administered medications for this visit.   Allergies:  Peanut -containing drug products, Propranolol, Cephalexin, Imitrex [sumatriptan], and Nickel   Social History: The patient  reports that she has never smoked. She has never used smokeless tobacco. She reports that she does not currently use alcohol. She reports that she does not use drugs.   Family History: The patient's family history includes Asthma in her son and son; Atrial fibrillation in her father; Cancer in her maternal grandfather and paternal grandfather; Healthy in her mother; Heart disease in her maternal grandmother; Hypertension in her brother; Klinefelter's syndrome in her son; Migraines in her sister; Stroke in her paternal grandfather and paternal grandmother.   ROS:  Please see the history of present illness. Otherwise, complete review of systems is positive for none  All other systems are reviewed and negative.   Physical Exam: VS:  BP 128/74   Pulse 69   Ht 5' 4 (1.626 m)   Wt 238 lb 6.4 oz (108.1 kg)   LMP 06/29/2024 (Exact Date)   SpO2 98%   BMI 40.92 kg/m , BMI Body mass index is 40.92 kg/m.  Wt Readings from Last 3 Encounters:  08/15/24 238 lb 6.4 oz (108.1 kg)  07/16/24 243 lb (110.2 kg)  07/02/24 239 lb 6.4 oz (108.6 kg)    General: Patient appears comfortable at rest. HEENT: Conjunctiva and lids normal, oropharynx clear with moist mucosa. Neck: Supple, no elevated JVP or carotid bruits, no thyromegaly. Lungs: Clear to auscultation, nonlabored breathing at rest. Cardiac: Regular rate and rhythm, no S3 or significant  systolic murmur, no pericardial rub. Abdomen: Soft, nontender, no hepatomegaly, bowel sounds present, no guarding or rebound. Extremities: No pitting edema, distal pulses 2+. Skin: Warm and dry. Musculoskeletal: No kyphosis. Neuropsychiatric: Alert and oriented x3, affect grossly appropriate.  Recent Labwork: 06/05/2024: Hemoglobin 11.7; Platelets 325 08/03/2024: TSH 0.604     Component Value Date/Time   CHOL 167 03/11/2015 0919   TRIG 71 03/11/2015 0919   HDL 46 03/11/2015 0919   CHOLHDL 3.6 03/11/2015 0919   LDLCALC 107 (H) 03/11/2015 0919    Assessment and Plan:  Syncope, rule out cardiac etiology - 3 syncopal events so far, Fall 2023, Spring 2024, June 2025. - She had a prodrome symptoms of dizziness followed by syncope.  No triggering factors noted.  No seizure-like activity or postictal confusion noted. - Echocardiogram  in 2025 revealed no structural heart disease.  No valvular heart disease either. - Obtain 2-week event monitor, live.  If normal and if she has recurrent syncope, she will benefit from loop recorder placement. - Obtain ETT.  30 minutes spent in reviewing prior records, labs/tests/reports, discussion of the above plan with the patient and documentation.  I answered all her questions.     Medication Adjustments/Labs and Tests Ordered: Current medicines are reviewed at length with the patient today.  Concerns regarding medicines are outlined above.    Disposition:  Follow up pending results  Signed Saara Kijowski Priya Samella Lucchetti, MD, 08/15/2024 3:29 PM    [facility] [address] [place], [place], [place] [address]

## 2024-08-15 NOTE — Patient Instructions (Signed)
 Medication Instructions:  Your physician recommends that you continue on your current medications as directed. Please refer to the Current Medication list given to you today.   Labwork: None  Testing/Procedures: Your physician has requested that you have an exercise tolerance test. For further information please visit [email or web address] Please also follow instruction sheet, as given.  Your physician has recommended that you wear a Zio monitor.   This monitor is a medical device that records the heart's electrical activity. Doctors most often use these monitors to diagnose arrhythmias. Arrhythmias are problems with the speed or rhythm of the heartbeat. The monitor is a small device applied to your chest. You can wear one while you do your normal daily activities. While wearing this monitor if you have any symptoms to push the button and record what you felt. Once you have worn this monitor for the period of time provider prescribed (for 14 days), you will return the monitor device in the postage paid box. Once it is returned they will download the data collected and provide us  with a report which the provider will then review and we will call you with those results. Important tips:  Avoid showering during the first 24 hours of wearing the monitor. Avoid excessive sweating to help maximize wear time. Do not submerge the device, no hot tubs, and no swimming pools. Keep any lotions or oils away from the patch. After 24 hours you may shower with the patch on. Take brief showers with your back facing the shower head.  Do not remove patch once it has been placed because that will interrupt data and decrease adhesive wear time. Push the button when you have any symptoms and write down what you were feeling. Once you have completed wearing your monitor, remove and place into box which has postage paid and place in your outgoing mailbox.  If for some reason you have misplaced your box then call our  office and we can provide another box and/or mail it off for you.   Follow-Up: Your physician recommends that you schedule a follow-up appointment in: Pending results  Any Other Special Instructions Will Be Listed Below (If Applicable). Thank you for choosing  [facility]!     If you need a refill on your cardiac medications before your next appointment, please call your pharmacy.

## 2024-08-17 ENCOUNTER — Encounter: Payer: Self-pay | Admitting: Allergy & Immunology

## 2024-08-17 ENCOUNTER — Other Ambulatory Visit: Payer: Self-pay

## 2024-08-17 ENCOUNTER — Ambulatory Visit (INDEPENDENT_AMBULATORY_CARE_PROVIDER_SITE_OTHER): Admitting: Allergy & Immunology

## 2024-08-17 VITALS — BP 120/80 | HR 105 | Temp 97.2°F | Resp 18 | Ht 64.57 in | Wt 236.4 lb

## 2024-08-17 DIAGNOSIS — J4530 Mild persistent asthma, uncomplicated: Secondary | ICD-10-CM

## 2024-08-17 DIAGNOSIS — T7800XD Anaphylactic reaction due to unspecified food, subsequent encounter: Secondary | ICD-10-CM | POA: Diagnosis not present

## 2024-08-17 DIAGNOSIS — J3089 Other allergic rhinitis: Secondary | ICD-10-CM | POA: Diagnosis not present

## 2024-08-17 DIAGNOSIS — L2089 Other atopic dermatitis: Secondary | ICD-10-CM | POA: Diagnosis not present

## 2024-08-17 DIAGNOSIS — J302 Other seasonal allergic rhinitis: Secondary | ICD-10-CM

## 2024-08-17 NOTE — Patient Instructions (Addendum)
 1. 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  - EpiPen  is up to date.   2. Seasonal and perennial allergic rhinitis - Continue with cetirizine 10mg  daily. - Continue with fluticasone  one spray per nostril up to twice daily as needed.   3. Flexural atopic dermatitis - failed Eucrisa , hydrocortisone , triamcinolone , and clobetasol  - Continue with your current regimen. - Your skin looks great today.  - Continue with moisturizing twice daily.  - Continue with triamcinolone  0.5% ointment twice daily on the lesion on your hand.   4. Mild persistent asthma, uncomplicated  - Spiro look excellent. - Daily controller medication(s): NOTHING  - Prior to physical activity: albuterol  2 puffs 10-15 minutes before physical activity. - Rescue medications: albuterol  4 puffs every 4-6 hours as needed - Changes during respiratory infections or worsening symptoms: Add on Flovent  to 2 puffs twice daily for TWO WEEKS. - Asthma control goals:  * Full participation in all desired activities (may need albuterol  before activity) * Albuterol  use two time or less a week on average (not counting use with activity) * Cough interfering with sleep two time or less a month * Oral steroids no more than once a year * No hospitalizations  5. Return in about 1 year (around 08/17/2025). You can have the follow up appointment with Dr. Iva or a Nurse Practicioner (our Nurse Practitioners are excellent and always have Physician oversight!).    Please inform us  of any Emergency Department visits, hospitalizations, or changes in symptoms. Call us  before going to the ED for breathing or allergy  symptoms since we might be able to fit you in for a sick visit. Feel free to contact us  anytime with any questions, problems, or concerns.  It was a pleasure to see you again as well as Pamla and [place] today!  Websites that have reliable patient information: 1. American Academy of Asthma, Allergy , and  Immunology: www.aaaai.org 2. Food Allergy  Research and Education (FARE): foodallergy.org 3. Mothers of Asthmatics: http://www.asthmacommunitynetwork.org 4. American College of Allergy , Asthma, and Immunology: www.acaai.org      "Like" us  on Facebook and Instagram for our latest updates!      A healthy democracy works best when [employer] participate! Make sure you are registered to vote! If you have moved or changed any of your contact information, you will need to get this updated before voting! Scan the QR codes below to learn more!
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
## 2024-08-21 ENCOUNTER — Ambulatory Visit (HOSPITAL_COMMUNITY)
Admission: RE | Admit: 2024-08-21 | Discharge: 2024-08-21 | Disposition: A | Discharge status: Home / Self Care | Source: Ambulatory Visit | Attending: Internal Medicine | Admitting: Internal Medicine

## 2024-08-21 ENCOUNTER — Other Ambulatory Visit: Payer: Self-pay | Admitting: Physician Assistant

## 2024-08-21 DIAGNOSIS — R55 Syncope and collapse: Secondary | ICD-10-CM | POA: Insufficient documentation

## 2024-08-21 LAB — EXERCISE TOLERANCE TEST
Angina Index: 2
Base ST Depression (mm): 0 mm
Duke Treadmill Score: -2
Estimated workload: 7
Exercise duration (min): 5 min
Exercise duration (sec): 32 s
MPHR: 186 {beats}/min
Peak HR: 151 {beats}/min
Percent HR: 81 %
RPE: 13
Rest HR: 79 {beats}/min
ST Depression (mm): 0 mm

## 2024-08-21 NOTE — Progress Notes (Signed)
     Patricia [name] presented for a treadmill stress test (GXT) today.  I Lorette CINDERELLA Kapur, PA-C, provided direct supervision and was present during the study today, which was completed without significant symptoms, immediate complications, or acute ST/T changes on ECG.  Official results are pending at this time.  Preliminary ECG findings may be listed in the chart, but the stress test result will not be finalized until read by the Cardiologist.  Lorette CINDERELLA Kapur, PA-C  08/21/2024, 11:58 AM

## 2024-08-31 ENCOUNTER — Ambulatory Visit: Payer: Self-pay | Admitting: Internal Medicine

## 2024-09-13 ENCOUNTER — Encounter: Payer: Self-pay | Admitting: Neurology

## 2024-09-17 DIAGNOSIS — R55 Syncope and collapse: Secondary | ICD-10-CM | POA: Diagnosis not present

## 2024-10-05 ENCOUNTER — Ambulatory Visit: Payer: Self-pay | Admitting: Internal Medicine

## 2024-10-11 ENCOUNTER — Ambulatory Visit: Admission: EM | Admit: 2024-10-11 | Discharge: 2024-10-11 | Disposition: A | Discharge status: Home / Self Care

## 2024-10-11 DIAGNOSIS — R55 Syncope and collapse: Secondary | ICD-10-CM | POA: Diagnosis not present

## 2024-10-11 DIAGNOSIS — R112 Nausea with vomiting, unspecified: Secondary | ICD-10-CM | POA: Diagnosis not present

## 2024-10-11 DIAGNOSIS — S0990XA Unspecified injury of head, initial encounter: Secondary | ICD-10-CM

## 2024-10-11 NOTE — ED Notes (Signed)
 Patient is being discharged from the Urgent Care and sent to the Emergency Department via private vehicle . Per R. Lane PA, patient is in need of higher level of care due to syncope. Patient is aware and verbalizes understanding of plan of care.  Vitals:   10/11/24 1654  BP: 137/88  Pulse: 60  Resp: 16  Temp: 98.9 F (37.2 C)  SpO2: 96%

## 2024-10-11 NOTE — ED Provider Notes (Signed)
 [facility]    CSN: [id number] Arrival date & time: 10/11/24  1645      History   Chief Complaint Chief Complaint  Patient presents with   Dizziness    HPI Patricia [name] is a 32 y.o. female.   Patient presenting today following a syncopal episode 2 days ago where she fell, hit her head and was out for an unknown period of time.  She states this is an episodic issue for her the past several years and has had multiple workups with no obvious findings.  She is awaiting consultation with a neurologist in the next month or so.  She notes she had an additional episode while driving today where she became lightheaded, had blurred vision, had to pull over in her car and vomited.  She states she had to sit there for quite some time before the episode passed but she is still feeling very dizzy, nauseated.  Denies mental status changes, speech or swallowing changes, fever, chills, chest pain, shortness of breath, palpitations.  Not trying anything over-the-counter for symptoms.  Not currently on any chronic anticoagulation.    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 05/28/2020   Pap +HPV other and [facility], will need colpo per ASCCP guidelines,immediate  risk for CIN 3+ 12.6%    Asthma    Cough 08/01/2015   Depression    Fatty liver    Flexural atopic dermatitis 05/28/2022   GERD (gastroesophageal reflux disease)    HSIL (high grade squamous intraepithelial lesion) on Pap smear of cervix 06/04/2022   06/04/22 colpo per ASCCP guidelines, immediate risk is 23 %   Migraine    Migraines    Nausea and vomiting during pregnancy prior to [redacted] weeks gestation 08/01/2015   Nausea vomiting and diarrhea    Obesity    PCO (polycystic ovaries) 03/08/2014   Polycystic disease, ovaries    Pregnancy induced hypertension    Pregnant 05/27/2015   URI (upper respiratory infection) 08/01/2015   URI (upper  respiratory infection) 08/01/2015    Patient Active Problem List   Diagnosis Date Noted   Syncope and collapse 08/15/2024   HSIL (high grade squamous intraepithelial lesion) on Pap smear of cervix 06/04/2022   ASCUS with positive high risk HPV cervical 05/31/2022   Urinary incontinence 05/31/2022   Routine general medical examination at a health care facility 05/31/2022   Mixed stress and urge urinary incontinence 05/31/2022   Mild persistent asthma, uncomplicated 05/28/2022   Flexural atopic dermatitis 05/28/2022   Pap smear of cervix with ASCUS, cannot exclude HGSIL 05/26/2021   Hip pain, right 05/26/2021   Family planning 05/26/2021   RUQ pain 04/22/2021   Esophagitis 04/22/2021   Pelvic pain 11/28/2020   Hypertension 07/15/2020   Abnormal Papanicolaou smear of cervix with positive human papilloma virus (HPV) test 05/28/2020   Nexplanon removal 05/20/2020   Encounter for gynecological examination with Papanicolaou smear of cervix 05/20/2020   Depression 05/20/2020   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   Fatty liver 11/29/2019   Diarrhea 09/27/2019   Elevated LFTs 06/17/2017   Calculus of gallbladder with acute cholecystitis without obstruction    Nausea vomiting and diarrhea    RUQ abdominal pain    Transaminitis    Biliary colic    Gall stones, common bile duct    Abdominal pain 06/04/2017   Chronic hypertension complicating or reason for care during childbirth  04/13/2017   Chronic hypertension in pregnancy 04/13/2017   Chronic hypertension affecting pregnancy 11/08/2016   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   Supervision of high risk pregnancy, antepartum 09/27/2016   History of gestational hypertension 09/27/2016   History of shoulder dystocia in prior pregnancy, currently pregnant 09/27/2016   URI (upper respiratory infection) 08/01/2015   Persistent cough 08/01/2015   Obesity, Class III, BMI 40-49.9 (morbid obesity) (HCC)  09/23/2014   Headache 09/23/2014   PCO (polycystic ovaries) 03/08/2014    Past Surgical History:  Procedure Laterality Date   BIOPSY  01/20/2021   Procedure: BIOPSY;  Surgeon: Cindie Carlin POUR, DO;  Location: [facility];  Service: Endoscopy;;   CESAREAN SECTION  10/11/2023   CHOLECYSTECTOMY N/A 06/08/2017   Procedure: LAPAROSCOPIC CHOLECYSTECTOMY;  Surgeon: Mavis Anes, MD;  Location: [facility];  Service: General;  Laterality: N/A;   ESOPHAGOGASTRODUODENOSCOPY (EGD) WITH PROPOFOL  N/A 01/20/2021   LA Grade A reflux esophagitis, gastritis s/p biopsy, normal duodenum s/p biopsy. Negative H.pylori. negative celiac sprue.    LEEP N/A 07/14/2022   Procedure: LOOP ELECTROSURGICAL EXCISION PROCEDURE (LEEP);  Surgeon: Ozan, Jennifer, DO;  Location: [facility];  Service: Gynecology;  Laterality: N/A;   sleeve gastrectomy     Dec 2022   TIF  08/2021   TONSILLECTOMY     WISDOM TOOTH EXTRACTION      OB History     Gravida  5   Para  3   Term  3   Preterm      AB  1   Living  4      SAB  1   IAB      Ectopic      Multiple  1   Live Births  4            Home Medications    Prior to Admission medications   Medication Sig Start Date End Date Taking? Authorizing Provider  albuterol  (PROVENTIL  HFA) 108 (90 Base) MCG/ACT inhaler Inhale 2 puffs into the lungs every 4 (four) hours as needed for wheezing or shortness of breath. 06/27/23   Iva Marty Saltness, MD  beclomethasone (QVAR  REDIHALER) 80 MCG/ACT inhaler Use two puffs twice daily for one to two weeks during flares. 07/20/23   Iva Marty Saltness, MD  Calcium Carb-Cholecalciferol (CALCIUM 500 + D PO) Take 2 tablets by mouth 2 (two) times daily.    [provider]  Cetirizine HCl 10 MG TBDP Take 10 mg by mouth daily.    [provider]  EPINEPHrine  0.3 mg/0.3 mL IJ SOAJ injection into the thigh. 06/22/23   [provider]  ferrous sulfate  324 MG TBEC Take 324 mg by mouth.    [provider]  fluticasone  (FLONASE ) 50 MCG/ACT nasal spray 1-2 sprays each nostril daily as needed for stuffy nose. 02/17/22   Cheryl Reusing, FNP  fluticasone  (FLOVENT  HFA) 110 MCG/ACT inhaler Use 2 puffs 2 times daily for 1-2 weeks at a time until symptoms return to normal. 07/04/23   Iva Marty Saltness, MD  hydrocortisone  2.5 % ointment APPLY  OINTMENT EXTERNALLY TO AFFECTED AREA TWICE DAILY AS NEEDED TO  RED  ITCHY  AREAS 04/06/23   Cheryl Reusing, FNP  lisdexamfetamine (VYVANSE) 50 MG capsule Take 50 mg by mouth every morning.    [provider]  ondansetron  (ZOFRAN -ODT) 4 MG disintegrating tablet Take 1 tablet (4 mg total) by mouth every 8 (eight) hours as needed for nausea or vomiting. 06/05/24   Enedelia Going  M, FNP  pantoprazole  (PROTONIX ) 40 MG tablet Take 1 tablet (40 mg total) by mouth daily before breakfast. 07/16/24   Ezzard Sonny RAMAN, PA-C  sertraline  (ZOLOFT ) 50 MG tablet Take 1 tablet by mouth daily. 12/01/23 11/30/24  [provider]  TRUVADA 200-300 MG tablet Take 1 tablet every day by oral route as directed for 30 days. 02/25/24   [provider]    Family History Family History  Problem Relation Age of Onset   Cancer Paternal Grandfather        prostate   Stroke Paternal Grandfather    Stroke Paternal Grandmother    Heart disease Maternal Grandmother    Cancer Maternal Grandfather        prostate   Atrial fibrillation Father    Healthy Mother    Hypertension Brother    Migraines Sister    Asthma Son    Asthma Son    Klinefelter's syndrome Son    Colon cancer Neg Hx    Allergic rhinitis Neg Hx    Angioedema Neg Hx    Atopy Neg Hx    Eczema Neg Hx    Urticaria Neg Hx    Immunodeficiency Neg Hx     Social History Social History   Tobacco Use   Smoking status: Never   Smokeless tobacco: Never  Vaping Use   Vaping status: Never Used  Substance Use Topics   Alcohol use: Not Currently    Comment: occassional   Drug use: No      Allergies   Peanut -containing drug products, Propranolol, Cephalexin, Imitrex [sumatriptan], and Nickel   Review of Systems Review of Systems Per HPI  Physical Exam Triage Vital Signs ED Triage Vitals  Encounter Vitals Group     BP 10/11/24 1654 137/88     Girls Systolic BP Percentile --      Girls Diastolic BP Percentile --      Boys Systolic BP Percentile --      Boys Diastolic BP Percentile --      Pulse Rate 10/11/24 1654 60     Resp 10/11/24 1654 16     Temp 10/11/24 1654 98.9 F (37.2 C)     Temp Source 10/11/24 1654 Oral     SpO2 10/11/24 1654 96 %     Weight --      Height --      Head Circumference --      Peak Flow --      Pain Score 10/11/24 1653 0     Pain Loc --      Pain Education --      Exclude from Growth Chart --    No data found.  Updated Vital Signs BP 137/88 (BP Location: Right Arm)   Pulse 60   Temp 98.9 F (37.2 C) (Oral)   Resp 16   LMP 09/22/2024 (Exact Date)   SpO2 96%   Breastfeeding No   Visual Acuity Right Eye Distance:   Left Eye Distance:   Bilateral Distance:    Right Eye Near:   Left Eye Near:    Bilateral Near:     Exam abbreviated today as decision was made early on to go to the emergency department for further evaluation Physical Exam Vitals and nursing note reviewed.  Constitutional:      Appearance: Normal appearance. She is not ill-appearing.  HENT:     Head: Atraumatic.  Eyes:     Extraocular Movements: Extraocular movements intact.     Conjunctiva/sclera:  Conjunctivae normal.  Cardiovascular:     Rate and Rhythm: Normal rate.  Pulmonary:     Effort: Pulmonary effort is normal.  Musculoskeletal:        General: Normal range of motion.     Cervical back: Normal range of motion and neck supple.  Skin:    General: Skin is warm and dry.  Neurological:     Mental Status: She is alert and oriented to person, place, and time.  Psychiatric:        Mood and Affect: Mood normal.        Thought Content:  Thought content normal.        Judgment: Judgment normal.      UC Treatments / Results  Labs (all labs ordered are listed, but only abnormal results are displayed) Labs Reviewed - No data to display  EKG   Radiology No results found.  Procedures Procedures (including critical care time)  Medications Ordered in UC Medications - No data to display  Initial Impression / Assessment and Plan / UC Course  I have reviewed the triage vital signs and the nursing notes.  Pertinent labs & imaging results that were available during my care of the patient were reviewed by me and considered in my medical decision making (see chart for details).     Vitals within normal limits, she is overall well-appearing in no acute distress but given head injury 2 days ago and now developing neurologic symptoms do recommend further evaluation in the emergency department to rule out more emergent causes of her new symptoms.  She is agreeable to this and has a family member in the waiting room who will drive her to the emergency department.  She is hemodynamically stable for private vehicle transport at this time.  Final Clinical Impressions(s) / UC Diagnoses   Final diagnoses:  Syncope, unspecified syncope type  Injury of head, initial encounter  Nausea and vomiting, unspecified vomiting type   Discharge Instructions   None    ED Prescriptions   None    PDMP not reviewed this encounter.   Stuart Vernell Norris, [place] 10/11/24 1947

## 2024-10-11 NOTE — ED Triage Notes (Signed)
 The pt states that on Tuesday she had a syncopal event causing her to fall and hit the back of her head. States preceding this she had some dizziness and nausea all day. The pt states that when she woke up she felt like she was having a hard time speaking. States that she had similar feelings today but no LOC today. States that this has happened in the past in June, states that she had a home heart monitor and a ct and a referral with neurology in February .

## 2024-10-11 NOTE — ED Provider Notes (Signed)
 ------------------------------------------------------------------------------- Attestation signed by Charlie Aleene Campion, MD at 10/13/2024  8:15 PM I saw and evaluated the patient. Discussed with resident physician and agree with their findings and plan and documented in the their note.   Diagnostic Exams: I have personally reviewed the images and agree with the findings as documented   Neuro exam reveals GCS 15, alert and oriented to person place and time, 5 out of 5 strength in all 4 extremities, sensation preserved throughout all dermatomes, normal finger-nose, heel-to-shin, arm rolling, normal gait.  Pupils equal and reactive.  EOMI negative pronator drift.   Charlie Aleene Campion, MD  -------------------------------------------------------------------------------  Emergency Department Provider Note  HPI, ROS, Medical Decision Making  Chief Complaint:  Loss of Consciousness   Patricia [name] is a 33 y.o. female patient with a PMHx of asthma, ADHD presenting with presyncope and syncopal episodes.  Patient states she has had 2 episodes of syncope and near syncope this week.  Normally they are several months apart and she presents today with concern increase in frequency.  States she passed out on her bed suddenly after feeling lightheaded.  She had additional episode while driving today where she felt lightheaded.  At which point she pulled over, vomited, and sensation resolved with time and rest.  Denies postictal state, tongue biting, urinary incontinence, focal deficits, gait abnormalities.  There is no context to these episodes, nothing makes them better or worse..  They are nonpositional.  Is not on any medications for blood pressure.  No recent medication changes.  Allergies to propranolol. Denies substance use, drug use.  Per chart review has been seen by cardiology and September 2025, received Holter monitor and echo.  Physical Exam   Vitals:   10/11/24  1837 10/11/24 1935 10/11/24 2224  BP: (!) 152/91 146/88   Patient Position:  Sitting   Pulse: 68 71 (!) 46  Resp: 18 18 (!) 10  Temp: 98.4 F (36.9 C)    SpO2: 100% 100% 97%    Physical Exam Constitutional:      Appearance: Normal appearance.  HENT:     Head: Normocephalic and atraumatic.  Eyes:     Extraocular Movements: Extraocular movements intact.     Conjunctiva/sclera: Conjunctivae normal.     Pupils: Pupils are equal, round, and reactive to light.  Cardiovascular:     Rate and Rhythm: Regular rhythm. Bradycardia present.  Pulmonary:     Effort: Pulmonary effort is normal.     Breath sounds: Normal breath sounds.  Abdominal:     General: Abdomen is flat.     Palpations: Abdomen is soft.  Musculoskeletal:        General: No swelling.     Cervical back: Neck supple.     Right lower leg: No edema.     Left lower leg: No edema.  Skin:    General: Skin is warm and dry.  Neurological:     General: No focal deficit present.     Mental Status: She is alert and oriented to person, place, and time.     GCS: GCS eye subscore is 4. GCS verbal subscore is 5. GCS motor subscore is 6.     Cranial Nerves: Cranial nerves 2-12 are intact. No cranial nerve deficit.     Motor: No weakness.     Gait: Gait is intact.     Comments: Full strength upper and lower extremities grossly.  Full sensation to light touch upper and lower extremities intact.  ROS Negative except as per HPI  Upon presentation, the patient is medically stable. Differential includes arrhythmia, seizure, vasovagal syncope.  EKG: no ischemic changes, sinus bradycardia Lab Interpretation: No metabolic derangements , no anemia. hCG negative normal troponin Personal review of Imaging: XR: No acute intracranial abnormality  Procedure Note  Procedures   MDM Syncopal events concerning for underlying arrhythmia.  Unlikely seizure based on history and physical exam.  Per chart review extensive workup has been  completed in the outpatient setting for patient's syncopal episodes including Holter monitor TTE and specialty consultations.  Basic workup in the ED returned negative.  POCUS revealed adequate ejection fraction and no effusion.  Despite low heart rates, patient has adequate physiologic response with exertion. Upon reevaluation of patient at bedside, exam and vitals remained unchanged.  At this time, I feel that the patient is medically cleared for discharge and may follow up in the outpatient setting. I have discussed this with my attending who agrees.  I discussed with patient and family my overall assessment  and recommended outpatient follow-up for continued evaluation.  Discussed extensive outpatient workup that is completed thus far and returned normal.   I gave strict return precautions to come back to the ED including chest pain, shortness of breath, falls, worsening syncope, seizure-like activity, or chest pain with nausea vomiting or diaphoresis.   Medical History[1] Surgical History[2] Family History[3] Social History   Socioeconomic History  . Marital status: Married    Spouse name: Marceline Napierala  . Number of children: 3  . Years of education: Not on file  . Highest education level: Not on file  Occupational History  . Not on file  Tobacco Use  . Smoking status: Never  . Smokeless tobacco: Never  Vaping Use  . Vaping status: Never Used  Substance and Sexual Activity  . Alcohol use: Not Currently  . Drug use: Never  . Sexual activity: Yes    Partners: Male    Birth control/protection: Surgical    Comment: BTL  Other Topics Concern  . Not on file  Social History Narrative  . Not on file   Social Drivers of Health   Food Insecurity: Low Risk  (10/09/2024)   Food vital sign   . Within the past 12 months, you worried that your food would run out before you got money to buy more: Never true   . Within the past 12 months, the food you bought just didn't last and you  didn't have money to get more: Never true  Transportation Needs: No Transportation Needs (10/09/2024)   Transportation   . In the past 12 months, has lack of reliable transportation kept you from medical appointments, meetings, work or from getting things needed for daily living? : No  Safety: Low Risk  (10/12/2023)   Safety   . How often does anyone, including family and friends, physically hurt you?: Never   . How often does anyone, including family and friends, insult or talk down to you?: Never   . How often does anyone, including family and friends, threaten you with harm?: Never   . How often does anyone, including family and friends, scream or curse at you?: Never  Living Situation: Low Risk  (10/09/2024)   Living Situation   . What is your living situation today?: I have a steady place to live   . Think about the place you live. Do you have problems with any of the following? Choose all that apply:: None/None on this  list     ED Course as of 10/11/24 2358  Thu Oct 11, 2024  2220 I saw this patient conjunction with the resident physician.  In brief is a 33 year old female with a history of recurrent syncope [RJ]    ED Course User Index [RJ] Charlie Aleene Campion, MD    1. Postural dizziness with presyncope     ED Disposition     ED Disposition  Discharge   Condition  Stable   Comment  --              The patient was seen, evaluated, and treated in conjunction with the attending physician, who voiced agreement in the care provided.  Note generated using Dragon voice dictation software and may contain dictation errors. Please contact me for any clarification or with any questions.   Electronically signed by:  Richardean Eastern, M.D. ([email or web address])  10/11/2024 9:46 PM       [1] Past Medical History: Diagnosis Date  . Abnormal Pap smear of cervix   . Asthma (CMD)   . History of gestational hypertension   . Hypertension 07/15/2020   07/15/20 will rx losartan    DASH diet, and increase activity  . Liver disease 2021   non-alcoholic fatty liver disease  . Low HDL (under 40) 04/10/2020  . Metabolic syndrome 04/10/2020  . Polycystic ovary syndrome   . PONV (postoperative nausea and vomiting)   . Postpartum depression 12/01/2023  . Pre-diabetes 04/10/2020  . Urinary tract infection affecting pregnancy (CMD) 04/07/2023  [2] Past Surgical History: Procedure Laterality Date  . BARIATRIC SURGERY  10/2021  . CERVICAL BIOPSY  W/ LOOP ELECTRODE EXCISION N/A 06/2022  . CESAREAN SECTION, UNSPECIFIED N/A 10/11/2023   CESAREAN SECTION performed by Dannial Almarie Pipes, MD at [facility]  . CHOLECYSTECTOMY    . OTHER SURGICAL HISTORY  09/2021   transoral incisionless fundoplication  . SALPINGECTOMY Bilateral 07/18/2024   LAPAROSCOPIC LYSIS OF ADHESIONS BILATERAL SALPINGECTOMY performed by Kimberly Dawn Zander, MD at [facility] L[facility] OR  [3] Family History Problem Relation Name Age of Onset  . Stroke Paternal Grandfather    . Cancer Paternal Grandfather         lung  . Stroke Paternal Grandmother    . Cancer Maternal Grandfather         prostate  . Heart disease Father    . Stroke Father    . Hypertension Father    . Miscarriages / Stillbirths Mother    . Hypertension Mother    . Diabetes Brother    . Heart disease Brother    . Hypertension Brother    . Hypertension Sister    . Breast cancer Maternal Aunt    . Colon cancer Neg Hx    . Preterm labor Neg Hx    . Ovarian cancer Neg Hx    . Eclampsia Neg Hx

## 2024-10-11 NOTE — ED Triage Notes (Addendum)
 Pt states she passed out 2 days ago and hit her head. States today she was driving and felt lightheaded had blurred vision. States she pulled over and vomited x1. States this has been going on for since 2023 and she has had a CT of her brain and wore a halter monitor.  States she has an appointment with neuro in Feb.

## 2024-10-16 ENCOUNTER — Encounter: Payer: Self-pay | Admitting: Internal Medicine

## 2024-10-16 DIAGNOSIS — R55 Syncope and collapse: Secondary | ICD-10-CM

## 2024-11-14 ENCOUNTER — Other Ambulatory Visit: Payer: Self-pay | Admitting: Allergy & Immunology

## 2024-11-22 ENCOUNTER — Encounter: Payer: Self-pay | Admitting: Gastroenterology

## 2024-12-07 NOTE — Progress Notes (Signed)
 "  Initial neurology clinic note  [name] MRN: [id number] DOB: August 13, 1991  Referring provider: Hyacinth Honey, NP  Primary care provider: Shona Norleen PEDLAR, MD  Reason for consult:  dizziness  Subjective:  This is [name], a 34 y.o. right-handed female with a medical history of asthma, ADHD, depression, GERD, HLD, pre-DM who presents to neurology clinic with dizziness. The patient is alone today.  Patient started having passing out episodes in 2022. She can normally feel the episode starting, so she can sit down. It is a tingling feeling rushing over her body to feet and hands and then lightheaded, blurry vision, and imbalance. She denies feeling sweaty. The prodrome lasts about 30 seconds. She will try to sit down and prepare, but this does not change the symptoms. At about 30 seconds, she loses consciousness. It takes her some time to wake up, she feels certain it is less than 20 minutes, but she doesn't really know. She has fallen and hit the ground. She denies any shaking, tongue bite, or loss of bowel or bladder.  She had 3 episodes in 09/2024 and 2 episodes in 10/2024. She has had no episodes since 11/17/2024.    When asking about triggers, patient states she sees a therapist who has noted episodes are more likely around her cycle.  In 04/2024 she had an episode where she felt lightheaded and weak for an hour. She was in her driveway. She had put the car in park near the yard and then passed out. She was out at least 5 minutes.  Has seen cardiology without cause per notes - is being referred to EP for loop recorder? Per cardiology note from 08/15/24: - 3 syncopal events so far, Fall 2023, Spring 2024, June 2025. - She had a prodrome symptoms of dizziness followed by syncope.  No triggering factors noted.  No seizure-like activity or postictal confusion noted. - Echocardiogram in 2025 revealed no structural heart disease.  No valvular heart disease either. - Obtain 2-week  event monitor, live.  If normal and if she has recurrent syncope, she will benefit from loop recorder placement. - Obtain ETT.  Patient did not have an episode during the 2 week event monitor. Patient has still not heard about EP appointment for loop recorder. She did call this morning to check on this.  09/2024 she was cleaning in her room and fell over the bed. She landed on the back of her head and neck. She felt weak and unable to get up.  Seen at [facility] on 10/11/24. Per notes: Syncopal events concerning for underlying arrhythmia. Unlikely seizure based on history and physical exam. Per chart review extensive workup has been completed in the outpatient setting for patient's syncopal episodes including Holter monitor TTE and specialty consultations. Basic workup in the ED returned negative. POCUS revealed adequate ejection fraction and no effusion. Despite low heart rates, patient has adequate physiologic response with exertion. Upon reevaluation of patient at bedside, exam and vitals remained unchanged.   She has history of migraines that started in high school. She denies any history of aura. She had no episodes at that time. She does endorse her migraines increasing in frequency lately. She was not really having migraines for the last 7 years. In 10/2024 patient started having migraines; 1-2 per month over the last couple of months. The migraines do not seem to correlate with passing out episodes. For headaches: has listed allergies to propranolol and imitrex which she has tried before.  Of note, patient is currently on sertraline .  Patient saw Dr. Ines for dizziness in 2015 at [facility]. There was concern for migraine or peripheral etiology so patient was referral to ENT. Patient does not remember seeing ENT, but also does not remember seeing Dr. Ines.  MEDICATIONS:  Outpatient Encounter Medications as of 12/14/2024  Medication Sig   beclomethasone (QVAR  REDIHALER) 80 MCG/ACT inhaler USE 2  PUFFS TWICE DAILY FOR 1-2 WEEKS DURING FLARES   Calcium Carb-Cholecalciferol (CALCIUM 500 + D PO) Take 2 tablets by mouth 2 (two) times daily.   Cetirizine HCl 10 MG TBDP Take 10 mg by mouth daily.   EPINEPHrine  0.3 mg/0.3 mL IJ SOAJ injection into the thigh.   ferrous sulfate  324 MG TBEC Take 324 mg by mouth.   fluticasone  (FLONASE ) 50 MCG/ACT nasal spray 1-2 sprays each nostril daily as needed for stuffy nose.   fluticasone  (FLOVENT  HFA) 110 MCG/ACT inhaler Use 2 puffs 2 times daily for 1-2 weeks at a time until symptoms return to normal.   hydrocortisone  2.5 % ointment APPLY  OINTMENT EXTERNALLY TO AFFECTED AREA TWICE DAILY AS NEEDED TO  RED  ITCHY  AREAS   lisdexamfetamine (VYVANSE) 50 MG capsule Take 50 mg by mouth every morning. (Patient taking differently: Take 50 mg by mouth every morning. Stopping for now,)   metroNIDAZOLE  (FLAGYL ) 500 MG tablet Take 500 mg by mouth 2 (two) times daily.   Multiple Vitamin (MULTIVITAMIN) tablet Take 1 tablet by mouth daily.   ondansetron  (ZOFRAN -ODT) 4 MG disintegrating tablet Take 1 tablet (4 mg total) by mouth every 8 (eight) hours as needed for nausea or vomiting.   albuterol  (PROVENTIL  HFA) 108 (90 Base) MCG/ACT inhaler Inhale 2 puffs into the lungs every 4 (four) hours as needed for wheezing or shortness of breath. (Patient not taking: Reported on 12/14/2024)   pantoprazole  (PROTONIX ) 40 MG tablet Take 1 tablet (40 mg total) by mouth daily before breakfast. (Patient not taking: Reported on 12/14/2024)   TRUVADA 200-300 MG tablet Take 1 tablet every day by oral route as directed for 30 days. (Patient not taking: Reported on 12/14/2024)   No facility-administered encounter medications on file as of 12/14/2024.    PAST MEDICAL HISTORY: 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 05/28/2020   Pap +HPV other and [facility], will need colpo per ASCCP guidelines,immediate  risk for  CIN 3+ 12.6%    Asthma    Cough 08/01/2015   Depression    Fatty liver    Flexural atopic dermatitis 05/28/2022   GERD (gastroesophageal reflux disease)    HSIL (high grade squamous intraepithelial lesion) on Pap smear of cervix 06/04/2022   06/04/22 colpo per ASCCP guidelines, immediate risk is 23 %   Migraine    Migraines    Nausea and vomiting during pregnancy prior to [redacted] weeks gestation 08/01/2015   Nausea vomiting and diarrhea    Obesity    PCO (polycystic ovaries) 03/08/2014   Polycystic disease, ovaries    Pregnancy induced hypertension    Pregnant 05/27/2015   URI (upper respiratory infection) 08/01/2015   URI (upper respiratory infection) 08/01/2015    PAST SURGICAL HISTORY: Past Surgical History:  Procedure Laterality Date   BIOPSY  01/20/2021   Procedure: BIOPSY;  Surgeon: Cindie Carlin POUR, DO;  Location: [facility];  Service: Endoscopy;;   CESAREAN SECTION  10/11/2023   CHOLECYSTECTOMY N/A 06/08/2017   Procedure: LAPAROSCOPIC CHOLECYSTECTOMY;  Surgeon:  Mavis Anes, MD;  Location: [facility] ORS;  Service: General;  Laterality: N/A;   ESOPHAGOGASTRODUODENOSCOPY (EGD) WITH PROPOFOL  N/A 01/20/2021   LA Grade A reflux esophagitis, gastritis s/p biopsy, normal duodenum s/p biopsy. Negative H.pylori. negative celiac sprue.    LEEP N/A 07/14/2022   Procedure: LOOP ELECTROSURGICAL EXCISION PROCEDURE (LEEP);  Surgeon: Ozan, Jennifer, DO;  Location: [facility];  Service: Gynecology;  Laterality: N/A;   sleeve gastrectomy     Dec 2022   TIF  08/2021   TONSILLECTOMY     WISDOM TOOTH EXTRACTION      ALLERGIES: Allergies[1]  FAMILY HISTORY: Family History  Problem Relation Age of Onset   Cancer Paternal Grandfather        prostate   Stroke Paternal Grandfather    Stroke Paternal Grandmother    Heart disease Maternal Grandmother    Cancer Maternal Grandfather        prostate   Atrial fibrillation Father    Healthy Mother    Hypertension Brother    Migraines Sister     Asthma Son    Asthma Son    Klinefelter's syndrome Son    Colon cancer Neg Hx    Allergic rhinitis Neg Hx    Angioedema Neg Hx    Atopy Neg Hx    Eczema Neg Hx    Urticaria Neg Hx    Immunodeficiency Neg Hx     SOCIAL HISTORY: Social History[2] Social History   Social History Narrative   Are you right handed or left handed? Right   Are you currently employed ?    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   Who lives with you? family   What type of home do you live in: 1 story or 2 story? two    Caffiene 2 or 3 a week    Objective:  Vital Signs:  BP 123/83   Pulse 78   Ht 5' 4 (1.626 m)   Wt 240 lb (108.9 kg)   SpO2 100%   BMI 41.20 kg/m   General: No acute distress.  Patient appears well-groomed.   Head:  Normocephalic/atraumatic Eyes:  fundi examined, disc margins clear, no obvious papilledema Neck: supple, no paraspinal tenderness, full range of motion Heart: regular rate and rhythm Lungs: Clear to auscultation bilaterally. Vascular: No carotid bruits.  Neurological Exam: Mental status: alert and oriented, speech fluent and not dysarthric, language intact.  Cranial nerves: CN I: not tested CN II: pupils equal, round and reactive to light, visual fields intact CN III, IV, VI:  full range of motion, no nystagmus, no ptosis CN V: facial sensation intact. CN VII: upper and lower face symmetric CN VIII: hearing intact CN IX, X: uvula midline CN XI: sternocleidomastoid and trapezius muscles intact CN XII: tongue midline  Bulk & Tone: normal Motor:  muscle strength 5/5 throughout Deep Tendon Reflexes:  2+ throughout,  toes downgoing.   Sensation:  Light touch sensation intact. Finger to nose testing:  Without dysmetria.   RAM normal Gait:  Normal station and stride.  Romberg negative.   Labs and Imaging review: Internal labs: 08/03/24: Celiac panel negative CRP wnl ESR wnl TSH wnl  06/05/24: HIV  non-reactive RPR non-reactive  External labs: 10/11/24: CMP unremarkable CBC w/ diff unremarkable  06/14/24: Ferritin 100 HbA1c: 5.6 Lipid panel: tChol 160, LDL 88, TG 86  Imaging/Procedures: Echocardiogram (05/15/24): 1. Left ventricular ejection fraction, by estimation, is 60 to 65%. The  left ventricle  has normal function. The left ventricle has no regional  wall motion abnormalities. Left ventricular diastolic parameters were  normal.   2. Right ventricular systolic function is normal. The right ventricular  size is normal.   3. Left atrial size was mildly dilated.   4. The mitral valve is normal in structure. No evidence of mitral valve  regurgitation. No evidence of mitral stenosis.   5. The tricuspid valve is abnormal.   6. The aortic valve is tricuspid. Aortic valve regurgitation is not  visualized.   7. The inferior vena cava is normal in size with greater than 50%  respiratory variability, suggesting right atrial pressure of 3 mmHg.   CT head wo contrast (06/26/24): FINDINGS: Brain: No evidence of acute infarction, hemorrhage, hydrocephalus, extra-axial collection or mass lesion/mass effect.   Vascular: No hyperdense vessel.   Skull: No acute fracture.   Sinuses/Orbits: Clear sinuses.  No acute orbital findings.   Other: No mastoid effusions.   IMPRESSION: No evidence of acute intracranial abnormality.  Holter monitor (09/17/24): Average HR 86 bpm. No arrhythmias. Symptoms correlated with NSR.   Assessment/Plan:  LACHE DAGHER is a 34 y.o. female who presents for evaluation of recurrent syncope. She has a relevant medical history of asthma, ADHD, depression, GERD, HLD, pre-DM. Her neurological examination is normal. Available diagnostic data is significant for normal CT head, normal echocardiogram, and normal 2 week cardiac event monitor. The etiology of patient's symptoms is currently unclear. Cardiology is working up a potential cardiac cause. In terms of  possible neurologic cause, seizure, intracranial stenosis such as vertebrobasilar insufficiency, or even migraine are possible causes of recurrent stereotyped episodes. Patient's symptoms do not well match any of these etiologies though. Syncope for long periods of time (?up to 20 minutes) would be odd for a seizure or intracranial stenosis (or perhaps even a cardiac cause). It would also be unusual for migraines to cause syncope. Patient is not sure about the time though. I will get imaging and EEG to further evaluate for a possible neurologic cause as below.  PLAN: -MRI brain w/wo contrast -CTA head and neck -1 Hour EEG. May consider 72 hour monitoring if normal. -Discussed that patient is not to drive. She should be 6 months from last episode of syncope to be cleared to drive due to syncope of unknown etiology -Discussed not being alone in water , being on ladders or high places to avoid in case she has an episode -Follow up with cardiology as planned -Will closely monitor migraines. No intervention currently needed  -Return to clinic on 02/15/25 at 3:30 pm  The impression above as well as the plan as outlined below were extensively discussed with the patient who voiced understanding. All questions were answered to their satisfaction.  When available, results of the above investigations and possible further recommendations will be communicated to the patient via telephone/MyChart. Patient to call office if not contacted after expected testing turnaround time.   Total time spent reviewing records, interview, history/exam, documentation, and coordination of care on day of encounter:  45 min   Thank you for allowing me to participate in patient's care.  If I can answer any additional questions, I would be pleased to do so.  Venetia Potters, MD   CC: Shona Norleen PEDLAR, MD [address] [place] [address]  CC: Referring provider: Hyacinth Honey, NP [address] [place],  [place]  [address]     [1]  Allergies Allergen Reactions   Peanut -Containing Drug  Products Anaphylaxis and Hives   Propranolol Shortness Of Breath   Cephalexin Hives   Imitrex [Sumatriptan] Hives   Nickel Rash  [2]  Social History Tobacco Use   Smoking status: Never   Smokeless tobacco: Never  Vaping Use   Vaping status: Never Used  Substance Use Topics   Alcohol use: Not Currently    Comment: occassional   Drug use: No   "

## 2024-12-14 ENCOUNTER — Encounter: Payer: Self-pay | Admitting: Neurology

## 2024-12-14 ENCOUNTER — Ambulatory Visit (INDEPENDENT_AMBULATORY_CARE_PROVIDER_SITE_OTHER): Admitting: Neurology

## 2024-12-14 DIAGNOSIS — G43001 Migraine without aura, not intractable, with status migrainosus: Secondary | ICD-10-CM | POA: Diagnosis not present

## 2024-12-14 DIAGNOSIS — R55 Syncope and collapse: Secondary | ICD-10-CM

## 2024-12-14 NOTE — Patient Instructions (Addendum)
 I saw you today for your episodes of passing out.  I am not sure if your symptoms are neurologic and certainly want you to continue the cardiac work up, but I also want to do a neurologic work up that includes: -MRI of your brain -CT of the blood vessels in your head (CTA) -Brain wave testing called EEG to look for concern for seizure  A referral to [facility]) has been placed for your imaging and someone will contact you directly to schedule your appt. They are located at [address] [facility]. Please contact them directly by calling [phone] with any questions regarding your referral.  If you do not hear from someone to schedule in 1-2 weeks, please let me know.   I will be in touch when I have your results.  You should not be driving as you are having unexplained passing out putting you and others with you or on the road at risk if an episode occurred while driving. The [place] rule is 6 months of no episodes before you can drive.  You should also avoid being in water  like a bathtub, pool, or hot tube alone and should not be on a roof or ladder where an episode could occur and you be seriously injured or worse.  I will see you back in clinic on 02/15/25 at 3:30pm. Please stop at the front desk to schedule this prior to leaving.  Please let me know if you have any questions or concerns in the meantime.  The physicians and staff at [facility] are committed to providing excellent care. You may receive a survey requesting feedback about your experience at our office. We strive to receive very good responses to the survey questions. If you feel that your experience would prevent you from giving the office a very good  response, please contact our office to try to remedy the situation. We may be reached at [phone]. Thank you for taking the time out of your busy day to complete the survey.  Venetia Potters, MD [facility]

## 2024-12-18 ENCOUNTER — Encounter: Payer: Self-pay | Admitting: Neurology

## 2024-12-20 ENCOUNTER — Encounter: Payer: Self-pay | Admitting: Neurology

## 2024-12-21 ENCOUNTER — Other Ambulatory Visit: Payer: Self-pay | Admitting: Neurology

## 2024-12-21 ENCOUNTER — Ambulatory Visit
Admission: RE | Admit: 2024-12-21 | Discharge: 2024-12-21 | Disposition: A | Source: Ambulatory Visit | Attending: Neurology

## 2024-12-21 DIAGNOSIS — R55 Syncope and collapse: Secondary | ICD-10-CM

## 2024-12-21 DIAGNOSIS — G43001 Migraine without aura, not intractable, with status migrainosus: Secondary | ICD-10-CM

## 2024-12-21 MED ORDER — IOPAMIDOL (ISOVUE-370) INJECTION 76%
75.0000 mL | Freq: Once | INTRAVENOUS | Status: AC | PRN
  Administered 2024-12-21: 75 mL via INTRAVENOUS

## 2024-12-24 ENCOUNTER — Other Ambulatory Visit

## 2024-12-25 ENCOUNTER — Ambulatory Visit: Payer: Self-pay | Admitting: Neurology

## 2024-12-26 ENCOUNTER — Ambulatory Visit: Admitting: Neurology

## 2025-01-01 ENCOUNTER — Other Ambulatory Visit

## 2025-01-08 ENCOUNTER — Ambulatory Visit (HOSPITAL_COMMUNITY)

## 2025-01-30 ENCOUNTER — Ambulatory Visit: Admitting: Cardiovascular Disease

## 2025-02-15 ENCOUNTER — Ambulatory Visit: Payer: Self-pay | Admitting: Neurology

## 2025-08-16 ENCOUNTER — Ambulatory Visit: Admitting: Allergy & Immunology
